# Patient Record
Sex: Male | Born: 1965 | Race: White | Hispanic: No | Marital: Single | Smoking: Former smoker
Health system: Southern US, Community
[De-identification: ages and names within clinical notes are randomized; demographics above are authoritative.]

## PROBLEM LIST (undated history)

## (undated) ENCOUNTER — Emergency Department (HOSPITAL_COMMUNITY): Admission: EM | Discharge status: Absent without leave | Payer: Medicaid Other

## (undated) DIAGNOSIS — I10 Essential (primary) hypertension: Secondary | ICD-10-CM

## (undated) DIAGNOSIS — J45909 Unspecified asthma, uncomplicated: Secondary | ICD-10-CM

## (undated) DIAGNOSIS — M810 Age-related osteoporosis without current pathological fracture: Secondary | ICD-10-CM

## (undated) DIAGNOSIS — Z981 Arthrodesis status: Secondary | ICD-10-CM

## (undated) DIAGNOSIS — J449 Chronic obstructive pulmonary disease, unspecified: Secondary | ICD-10-CM

## (undated) DIAGNOSIS — M1990 Unspecified osteoarthritis, unspecified site: Secondary | ICD-10-CM

## (undated) DIAGNOSIS — E785 Hyperlipidemia, unspecified: Secondary | ICD-10-CM

## (undated) HISTORY — DX: Essential (primary) hypertension: I10

## (undated) HISTORY — DX: Arthrodesis status: Z98.1

## (undated) HISTORY — DX: Age-related osteoporosis without current pathological fracture: M81.0

## (undated) HISTORY — DX: Chronic obstructive pulmonary disease, unspecified: J44.9

## (undated) HISTORY — DX: Hyperlipidemia, unspecified: E78.5

## (undated) HISTORY — PX: BACK SURGERY: SHX140

## (undated) HISTORY — PX: JOINT REPLACEMENT: SHX530

---

## 2005-10-17 ENCOUNTER — Emergency Department: Payer: Self-pay | Admitting: Emergency Medicine

## 2007-04-28 ENCOUNTER — Emergency Department: Payer: Self-pay | Admitting: Emergency Medicine

## 2010-11-10 ENCOUNTER — Emergency Department: Payer: Self-pay | Admitting: Unknown Physician Specialty

## 2011-12-06 DIAGNOSIS — M1990 Unspecified osteoarthritis, unspecified site: Secondary | ICD-10-CM | POA: Insufficient documentation

## 2012-04-20 HISTORY — PX: TOTAL KNEE ARTHROPLASTY: SHX125

## 2012-09-25 ENCOUNTER — Emergency Department: Payer: Self-pay | Admitting: Emergency Medicine

## 2012-10-03 DIAGNOSIS — I10 Essential (primary) hypertension: Secondary | ICD-10-CM | POA: Insufficient documentation

## 2012-10-15 ENCOUNTER — Emergency Department: Payer: Self-pay | Admitting: Emergency Medicine

## 2014-09-03 LAB — HM HIV SCREENING LAB: HM HIV Screening: NEGATIVE

## 2014-12-25 ENCOUNTER — Emergency Department
Admission: EM | Admit: 2014-12-25 | Discharge: 2014-12-25 | Disposition: A | Discharge status: Home / Self Care | Payer: Self-pay | Attending: Emergency Medicine | Admitting: Emergency Medicine

## 2014-12-25 ENCOUNTER — Encounter: Payer: Self-pay | Admitting: Emergency Medicine

## 2014-12-25 ENCOUNTER — Emergency Department: Payer: Self-pay

## 2014-12-25 DIAGNOSIS — Y998 Other external cause status: Secondary | ICD-10-CM | POA: Insufficient documentation

## 2014-12-25 DIAGNOSIS — S42001A Fracture of unspecified part of right clavicle, initial encounter for closed fracture: Secondary | ICD-10-CM | POA: Insufficient documentation

## 2014-12-25 DIAGNOSIS — Y9389 Activity, other specified: Secondary | ICD-10-CM | POA: Insufficient documentation

## 2014-12-25 DIAGNOSIS — M898X1 Other specified disorders of bone, shoulder: Secondary | ICD-10-CM

## 2014-12-25 DIAGNOSIS — X58XXXA Exposure to other specified factors, initial encounter: Secondary | ICD-10-CM | POA: Insufficient documentation

## 2014-12-25 DIAGNOSIS — Y9289 Other specified places as the place of occurrence of the external cause: Secondary | ICD-10-CM | POA: Insufficient documentation

## 2014-12-25 HISTORY — DX: Unspecified osteoarthritis, unspecified site: M19.90

## 2014-12-25 MED ORDER — PROMETHAZINE HCL 25 MG/ML IJ SOLN
INTRAMUSCULAR | Status: AC
  Administered 2014-12-25: 12.5 mg via INTRAVENOUS
  Filled 2014-12-25: qty 1

## 2014-12-25 MED ORDER — HYDROMORPHONE HCL 1 MG/ML IJ SOLN
2.0000 mg | Freq: Once | INTRAMUSCULAR | Status: AC
  Administered 2014-12-25: 2 mg via INTRAVENOUS

## 2014-12-25 MED ORDER — HYDROMORPHONE HCL 1 MG/ML IJ SOLN
INTRAMUSCULAR | Status: AC
  Administered 2014-12-25: 2 mg via INTRAVENOUS
  Filled 2014-12-25: qty 2

## 2014-12-25 MED ORDER — OXYCODONE-ACETAMINOPHEN 5-325 MG PO TABS: 1.0000 | ORAL_TABLET | ORAL | Status: DC | PRN

## 2014-12-25 MED ORDER — PROMETHAZINE HCL 25 MG/ML IJ SOLN
12.5000 mg | Freq: Once | INTRAMUSCULAR | Status: AC
  Administered 2014-12-25: 12.5 mg via INTRAVENOUS
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
## 2014-12-25 NOTE — Discharge Instructions (Signed)
Clavicle Fracture °The clavicle, also called the collarbone, is the long bone that connects your shoulder to your rib cage. You can feel your collarbone at the top of your shoulders and rib cage. A clavicle fracture is a broken clavicle. It is a common injury that can happen at any age.  °CAUSES °Common causes of a clavicle fracture include: °· A direct blow to your shoulder. °· A car accident. °· A fall, especially if you try to break your fall with an outstretched arm. °RISK FACTORS °You may be at increased risk if: °· You are younger than 25 years or older than 75 years. Most clavicle fractures happen to people who are younger than 25 years. °· You are a male. °· You play contact sports. °SIGNS AND SYMPTOMS °A fractured clavicle is painful. It also makes it hard to move your arm. Other signs and symptoms may include: °· A shoulder that drops downward and forward. °· Pain when trying to lift your shoulder. °· Bruising, swelling, and tenderness over your clavicle. °· A grinding noise when you try to move your shoulder. °· A bump over your clavicle. °DIAGNOSIS °Your health care provider can usually diagnose a clavicle fracture by asking about your injury and examining your shoulder and clavicle. He or she may take an X-ray to determine the position of your clavicle. °TREATMENT °Treatment depends on the position of your clavicle after the fracture: °· If the broken ends of the bone are not out of place, your health care provider may put your arm in a sling or wrap a support bandage around your chest (figure-of-eight wrap). °· If the broken ends of the bone are out of place, you may need surgery. Surgery may involve placing screws, pins, or plates to keep your clavicle stable while it heals. Healing may take about 3 months. °When your health care provider thinks your fracture has healed enough, you may have to do physical therapy to regain normal movement and build up your arm strength. °HOME CARE INSTRUCTIONS   °· Apply ice to the injured area: °¨ Put ice in a plastic bag. °¨ Place a towel between your skin and the bag. °¨ Leave the ice on for 20 minutes, 2-3 times a day. °· If you have a wrap or splint: °¨ Wear it all the time, and remove it only to take a bath or shower. °¨ When you bathe or shower, keep your shoulder in the same position as when the sling or wrap is on. °¨ Do not lift your arm. °· If you have a figure-of-eight wrap: °¨ Another person must tighten it every day. °¨ It should be tight enough to hold your shoulders back. °¨ Allow enough room to place your index finger between your body and the strap. °¨ Loosen the wrap immediately if you feel numbness or tingling in your hands. °· Only take medicines as directed by your health care provider. °· Avoid activities that make the injury or pain worse for 4-6 weeks after surgery. °· Keep all follow-up appointments. °SEEK MEDICAL CARE IF:  °Your medicine is not helping to relieve pain and swelling. °SEEK IMMEDIATE MEDICAL CARE IF:  °Your arm is numb, cold, or pale, even when the splint is loose. °MAKE SURE YOU:  °· Understand these instructions. °· Will watch your condition. °· Will get help right away if you are not doing well or get worse. °Document Released: 03/16/2005 Document Revised: 06/11/2013 Document Reviewed: 04/29/2013 °ExitCare® Patient Information ©2015 [employer]. This information is   not intended to replace advice given to you by your health care provider. Make sure you discuss any questions you have with your health care provider. ° °

## 2014-12-27 ENCOUNTER — Encounter: Payer: Self-pay | Admitting: Emergency Medicine

## 2014-12-27 ENCOUNTER — Emergency Department
Admission: EM | Admit: 2014-12-27 | Discharge: 2014-12-27 | Disposition: A | Discharge status: Home / Self Care | Payer: Self-pay | Attending: Emergency Medicine | Admitting: Emergency Medicine

## 2014-12-27 DIAGNOSIS — X58XXXD Exposure to other specified factors, subsequent encounter: Secondary | ICD-10-CM | POA: Insufficient documentation

## 2014-12-27 DIAGNOSIS — Z720 Tobacco use: Secondary | ICD-10-CM | POA: Insufficient documentation

## 2014-12-27 DIAGNOSIS — S42001D Fracture of unspecified part of right clavicle, subsequent encounter for fracture with routine healing: Secondary | ICD-10-CM | POA: Insufficient documentation

## 2014-12-27 DIAGNOSIS — Z760 Encounter for issue of repeat prescription: Secondary | ICD-10-CM | POA: Insufficient documentation

## 2014-12-27 MED ORDER — OXYCODONE-ACETAMINOPHEN 7.5-325 MG PO TABS: 1.0000 | ORAL_TABLET | ORAL | Status: AC | PRN

## 2014-12-27 NOTE — Discharge Instructions (Signed)
Medication Refill, Emergency Department °We have refilled your medication today as a courtesy to you. It is best for your medical care, however, to take care of getting refills done through your primary caregiver's office. They have your records and can do a better job of follow-up than we can in the emergency department. °On maintenance medications, we often only prescribe enough medications to get you by until you are able to see your regular caregiver. This is a more expensive way to refill medications. °In the future, please plan for refills so that you will not have to use the emergency department for this. °Thank you for your help. Your help allows us to better take care of the daily emergencies that enter our department. °Document Released: 09/23/2003 Document Revised: 08/29/2011 Document Reviewed: 09/13/2013 °ExitCare® Patient Information ©2015 [employer]. This information is not intended to replace advice given to you by your health care provider. Make sure you discuss any questions you have with your health care provider. ° °

## 2014-12-27 NOTE — ED Notes (Signed)
D/c instructions reviewed w/ pt - pt denies any further questions or concerns at present.  Pt instructed to not use alcohol, drive, or operate heavy machinery while take the prescription pain medications as they could make him drowsy - pt verbalized understanding.   

## 2014-12-27 NOTE — ED Notes (Signed)
States he broke his collar bone 2 days ago and was seen here, ran out of vicodin

## 2014-12-27 NOTE — ED Provider Notes (Signed)
[facility] Emergency Department Provider Note  ____________________________________________  Time seen: Approximately 10:56 AM  I have reviewed the triage vital signs and the nursing notes.   HISTORY  Chief Complaint Shoulder Injury    HPI Tom FarberRobert L Biglow is a 49 y.o. male patient here today for refill of his pain medication secondary to his right  fracture clavicle.  Patient patient was seen here on 12/25/2014 given a three-day prescription of Percocets. Patient say follow up with family doctor who would not give him a prescription due to him having pain medication from the ER. Patient stated that medication has run now is not scheduled to see his orthopedic into the middle neck suite. Patient asked for a prescription of Percocet for 2 days to time over until he can see his family doctor on Monday. Patient is rating his pain as a 10 over 10. Patient is wearing a right arm sling   Past Medical History  Diagnosis Date  . Arthritis     There are no active problems to display for this patient.   Past Surgical History  Procedure Laterality Date  . Joint replacement Right     knee  . Back surgery      Current Outpatient Rx  Name  Route  Sig  Dispense  Refill  . oxyCODONE-acetaminophen (PERCOCET) 7.5-325 MG per tablet   Oral   Take 1 tablet by mouth every 4 (four) hours as needed for severe pain.   8 tablet   0   . oxyCODONE-acetaminophen (ROXICET) 5-325 MG per tablet   Oral   Take 1-2 tablets by mouth every 4 (four) hours as needed for severe pain.   30 tablet   0     Allergies Bee venom  History reviewed. No pertinent family history.  Social History History  Substance Use Topics  . Smoking status: Current Every Day Smoker  . Smokeless tobacco: Not on file  . Alcohol Use: No    Review of Systems Constitutional: No fever/chills Eyes: No visual changes. ENT: No sore throat. Cardiovascular: Denies chest pain. Respiratory:  Denies shortness of breath. Gastrointestinal: No abdominal pain.  No nausea, no vomiting.  No diarrhea.  No constipation. Genitourinary: Negative for dysuria. Musculoskeletal: Right clavicle fracture Skin: Negative for rash. Neurological: Negative for headaches, focal weakness or numbness. 10-point ROS otherwise negative.  ____________________________________________   PHYSICAL EXAM:  VITAL SIGNS: ED Triage Vitals  Enc Vitals Group     BP 12/27/14 1041 126/84 mmHg     Pulse Rate 12/27/14 1041 90     Resp 12/27/14 1041 20     Temp 12/27/14 1041 98 F (36.7 C)     Temp src --      SpO2 12/27/14 1041 97 %     Weight 12/27/14 1041 210 lb (95.255 kg)     Height 12/27/14 1041 6' (1.829 m)     Head Cir --      Peak Flow --      Pain Score 12/27/14 1041 10     Pain Loc --      Pain Edu? --      Excl. in GC? --     Constitutional: Alert and oriented. Well appearing and in no acute distress. Eyes: Conjunctivae are normal. PERRL. EOMI. Head: Atraumatic. Nose: No congestion/rhinnorhea. Mouth/Throat: Mucous membranes are moist.  Oropharynx non-erythematous. Neck: No stridor.  No cervical spine tenderness to palpation. Hematological/Lymphatic/Immunilogical: No cervical lymphadenopathy. Cardiovascular: Normal rate, regular rhythm. Grossly normal heart sounds.  Good peripheral circulation. Respiratory: Normal respiratory effort.  No retractions. Lungs CTAB. Gastrointestinal: Soft and nontender. No distention. No abdominal bruits. No CVA tenderness. Musculoskeletal: Patient is wearing a sling exam deferred.  Neurologic:  Normal speech and language. No gross focal neurologic deficits are appreciated. Speech is normal. No gait instability. Skin:  Skin is warm, dry and intact. No rash noted. Psychiatric: Mood and affect are normal. Speech and behavior are normal.  ____________________________________________   LABS (all labs ordered are listed, but only abnormal results are  displayed)  Labs Reviewed - No data to display ____________________________________________  EKG   ____________________________________________  RADIOLOGY  Reviewed previous x-ray taken 2 days ago showed a comminuted right clavicle fracture ____________________________________________   PROCEDURES  Procedure(s) performed: None  Critical Care performed: No  ____________________________________________   INITIAL IMPRESSION / ASSESSMENT AND PLAN / ED COURSE  Pertinent labs & imaging results that were available during my care of the patient were reviewed by me and considered in my medical decision making (see chart for details).  Medication refill. Patient given  prescriptions for 2 days of Percocets. Advised must see PCP orthopedics. Continue pain medication. ____________________________________________   FINAL CLINICAL IMPRESSION(S) / ED DIAGNOSES  Final diagnoses:  Encounter for medication refill      Joni Reining, PA-C 12/27/14 1112  [name], MD 12/27/14 1526

## 2015-01-06 DIAGNOSIS — I2510 Atherosclerotic heart disease of native coronary artery without angina pectoris: Secondary | ICD-10-CM | POA: Insufficient documentation

## 2015-03-06 ENCOUNTER — Emergency Department
Admission: EM | Admit: 2015-03-06 | Discharge: 2015-03-06 | Discharge status: Against Medical Advice | Payer: Self-pay | Attending: Emergency Medicine | Admitting: Emergency Medicine

## 2015-03-06 DIAGNOSIS — R339 Retention of urine, unspecified: Secondary | ICD-10-CM | POA: Insufficient documentation

## 2015-03-06 DIAGNOSIS — Z720 Tobacco use: Secondary | ICD-10-CM | POA: Insufficient documentation

## 2015-03-06 NOTE — ED Notes (Signed)
Pt reports not being able to urinate since yesterday. Pain 4/10. "Feels a lot of abdominal pressure."

## 2015-03-06 NOTE — ED Notes (Signed)
Bladder scan performed. 141 mL

## 2015-03-09 ENCOUNTER — Telehealth: Payer: Self-pay | Admitting: Emergency Medicine
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
## 2015-06-21 HISTORY — PX: ANKLE FUSION: SHX881

## 2017-05-08 DIAGNOSIS — M19071 Primary osteoarthritis, right ankle and foot: Secondary | ICD-10-CM | POA: Insufficient documentation

## 2017-12-25 DIAGNOSIS — M19071 Primary osteoarthritis, right ankle and foot: Secondary | ICD-10-CM | POA: Diagnosis not present

## 2018-02-05 DIAGNOSIS — M19071 Primary osteoarthritis, right ankle and foot: Secondary | ICD-10-CM | POA: Diagnosis not present

## 2018-04-20 DIAGNOSIS — Z125 Encounter for screening for malignant neoplasm of prostate: Secondary | ICD-10-CM | POA: Diagnosis not present

## 2018-04-20 DIAGNOSIS — I10 Essential (primary) hypertension: Secondary | ICD-10-CM | POA: Diagnosis not present

## 2018-04-20 DIAGNOSIS — Z7289 Other problems related to lifestyle: Secondary | ICD-10-CM | POA: Diagnosis not present

## 2018-04-20 DIAGNOSIS — R61 Generalized hyperhidrosis: Secondary | ICD-10-CM | POA: Diagnosis not present

## 2018-04-20 DIAGNOSIS — Z1211 Encounter for screening for malignant neoplasm of colon: Secondary | ICD-10-CM | POA: Diagnosis not present

## 2018-04-20 DIAGNOSIS — M25571 Pain in right ankle and joints of right foot: Secondary | ICD-10-CM | POA: Diagnosis not present

## 2018-04-20 DIAGNOSIS — R6889 Other general symptoms and signs: Secondary | ICD-10-CM | POA: Diagnosis not present

## 2018-07-06 DIAGNOSIS — D125 Benign neoplasm of sigmoid colon: Secondary | ICD-10-CM | POA: Diagnosis not present

## 2018-07-06 DIAGNOSIS — K640 First degree hemorrhoids: Secondary | ICD-10-CM | POA: Diagnosis not present

## 2018-07-06 DIAGNOSIS — K5730 Diverticulosis of large intestine without perforation or abscess without bleeding: Secondary | ICD-10-CM | POA: Diagnosis not present

## 2018-07-06 DIAGNOSIS — Z1211 Encounter for screening for malignant neoplasm of colon: Secondary | ICD-10-CM | POA: Diagnosis not present

## 2018-07-06 DIAGNOSIS — Z800 Family history of malignant neoplasm of digestive organs: Secondary | ICD-10-CM | POA: Diagnosis not present

## 2018-08-20 DIAGNOSIS — M19071 Primary osteoarthritis, right ankle and foot: Secondary | ICD-10-CM | POA: Diagnosis not present

## 2018-11-03 DIAGNOSIS — I10 Essential (primary) hypertension: Secondary | ICD-10-CM | POA: Diagnosis not present

## 2018-11-08 DIAGNOSIS — I10 Essential (primary) hypertension: Secondary | ICD-10-CM | POA: Diagnosis not present

## 2018-11-08 DIAGNOSIS — R062 Wheezing: Secondary | ICD-10-CM | POA: Diagnosis not present

## 2018-11-08 DIAGNOSIS — Z87891 Personal history of nicotine dependence: Secondary | ICD-10-CM | POA: Diagnosis not present

## 2018-11-23 DIAGNOSIS — I10 Essential (primary) hypertension: Secondary | ICD-10-CM | POA: Diagnosis not present

## 2019-01-29 DIAGNOSIS — H524 Presbyopia: Secondary | ICD-10-CM | POA: Diagnosis not present

## 2019-02-12 DIAGNOSIS — H5213 Myopia, bilateral: Secondary | ICD-10-CM | POA: Diagnosis not present

## 2019-03-22 DIAGNOSIS — H524 Presbyopia: Secondary | ICD-10-CM | POA: Diagnosis not present

## 2019-04-04 DIAGNOSIS — M25571 Pain in right ankle and joints of right foot: Secondary | ICD-10-CM | POA: Diagnosis not present

## 2019-04-04 DIAGNOSIS — Z87891 Personal history of nicotine dependence: Secondary | ICD-10-CM | POA: Diagnosis not present

## 2019-04-04 DIAGNOSIS — Z981 Arthrodesis status: Secondary | ICD-10-CM | POA: Diagnosis not present

## 2019-04-04 DIAGNOSIS — M19071 Primary osteoarthritis, right ankle and foot: Secondary | ICD-10-CM | POA: Diagnosis not present

## 2019-04-04 DIAGNOSIS — G8929 Other chronic pain: Secondary | ICD-10-CM | POA: Diagnosis not present

## 2019-04-09 DIAGNOSIS — M19071 Primary osteoarthritis, right ankle and foot: Secondary | ICD-10-CM | POA: Diagnosis not present

## 2019-04-10 DIAGNOSIS — M19071 Primary osteoarthritis, right ankle and foot: Secondary | ICD-10-CM | POA: Diagnosis not present

## 2019-04-30 DIAGNOSIS — M19071 Primary osteoarthritis, right ankle and foot: Secondary | ICD-10-CM | POA: Diagnosis not present

## 2019-05-22 DIAGNOSIS — D125 Benign neoplasm of sigmoid colon: Secondary | ICD-10-CM | POA: Diagnosis not present

## 2019-05-22 DIAGNOSIS — Z125 Encounter for screening for malignant neoplasm of prostate: Secondary | ICD-10-CM | POA: Diagnosis not present

## 2019-05-22 DIAGNOSIS — I10 Essential (primary) hypertension: Secondary | ICD-10-CM | POA: Diagnosis not present

## 2019-06-25 DIAGNOSIS — Z800 Family history of malignant neoplasm of digestive organs: Secondary | ICD-10-CM | POA: Diagnosis not present

## 2019-06-25 DIAGNOSIS — D126 Benign neoplasm of colon, unspecified: Secondary | ICD-10-CM | POA: Diagnosis not present

## 2019-06-25 DIAGNOSIS — Z87891 Personal history of nicotine dependence: Secondary | ICD-10-CM | POA: Diagnosis not present

## 2019-06-25 DIAGNOSIS — I2510 Atherosclerotic heart disease of native coronary artery without angina pectoris: Secondary | ICD-10-CM | POA: Diagnosis not present

## 2019-06-25 DIAGNOSIS — I10 Essential (primary) hypertension: Secondary | ICD-10-CM | POA: Diagnosis not present

## 2019-06-25 DIAGNOSIS — R7309 Other abnormal glucose: Secondary | ICD-10-CM | POA: Diagnosis not present

## 2019-06-25 DIAGNOSIS — G8929 Other chronic pain: Secondary | ICD-10-CM | POA: Diagnosis not present

## 2019-09-04 DIAGNOSIS — I10 Essential (primary) hypertension: Secondary | ICD-10-CM | POA: Diagnosis not present

## 2019-09-04 DIAGNOSIS — D125 Benign neoplasm of sigmoid colon: Secondary | ICD-10-CM | POA: Diagnosis not present

## 2019-09-04 DIAGNOSIS — Z125 Encounter for screening for malignant neoplasm of prostate: Secondary | ICD-10-CM | POA: Diagnosis not present

## 2019-09-04 DIAGNOSIS — F17211 Nicotine dependence, cigarettes, in remission: Secondary | ICD-10-CM | POA: Diagnosis not present

## 2019-09-25 DIAGNOSIS — Z7982 Long term (current) use of aspirin: Secondary | ICD-10-CM | POA: Diagnosis not present

## 2019-09-25 DIAGNOSIS — Z87891 Personal history of nicotine dependence: Secondary | ICD-10-CM | POA: Diagnosis not present

## 2019-09-25 DIAGNOSIS — R945 Abnormal results of liver function studies: Secondary | ICD-10-CM | POA: Diagnosis not present

## 2019-09-25 DIAGNOSIS — R1031 Right lower quadrant pain: Secondary | ICD-10-CM | POA: Diagnosis not present

## 2019-09-25 DIAGNOSIS — R112 Nausea with vomiting, unspecified: Secondary | ICD-10-CM | POA: Diagnosis not present

## 2019-09-25 DIAGNOSIS — R7989 Other specified abnormal findings of blood chemistry: Secondary | ICD-10-CM | POA: Diagnosis not present

## 2019-09-25 DIAGNOSIS — Z7951 Long term (current) use of inhaled steroids: Secondary | ICD-10-CM | POA: Diagnosis not present

## 2019-09-25 DIAGNOSIS — R1011 Right upper quadrant pain: Secondary | ICD-10-CM | POA: Diagnosis not present

## 2019-09-25 DIAGNOSIS — I10 Essential (primary) hypertension: Secondary | ICD-10-CM | POA: Diagnosis not present

## 2019-09-25 DIAGNOSIS — K760 Fatty (change of) liver, not elsewhere classified: Secondary | ICD-10-CM | POA: Diagnosis not present

## 2019-09-25 DIAGNOSIS — R10813 Right lower quadrant abdominal tenderness: Secondary | ICD-10-CM | POA: Diagnosis not present

## 2019-09-25 LAB — HM HEPATITIS C SCREENING LAB: HM Hepatitis Screen: NEGATIVE

## 2019-09-30 ENCOUNTER — Ambulatory Visit: Payer: Self-pay

## 2019-10-07 DIAGNOSIS — M25551 Pain in right hip: Secondary | ICD-10-CM | POA: Diagnosis not present

## 2019-10-07 DIAGNOSIS — R1031 Right lower quadrant pain: Secondary | ICD-10-CM | POA: Diagnosis not present

## 2019-10-07 DIAGNOSIS — R110 Nausea: Secondary | ICD-10-CM | POA: Diagnosis not present

## 2019-10-07 DIAGNOSIS — Z7982 Long term (current) use of aspirin: Secondary | ICD-10-CM | POA: Diagnosis not present

## 2019-10-07 DIAGNOSIS — I10 Essential (primary) hypertension: Secondary | ICD-10-CM | POA: Diagnosis not present

## 2019-10-07 DIAGNOSIS — M1990 Unspecified osteoarthritis, unspecified site: Secondary | ICD-10-CM | POA: Diagnosis not present

## 2019-10-07 DIAGNOSIS — Z87891 Personal history of nicotine dependence: Secondary | ICD-10-CM | POA: Diagnosis not present

## 2019-10-07 DIAGNOSIS — Z79899 Other long term (current) drug therapy: Secondary | ICD-10-CM | POA: Diagnosis not present

## 2019-10-07 DIAGNOSIS — E876 Hypokalemia: Secondary | ICD-10-CM | POA: Diagnosis not present

## 2019-10-07 DIAGNOSIS — Z800 Family history of malignant neoplasm of digestive organs: Secondary | ICD-10-CM | POA: Diagnosis not present

## 2019-10-07 DIAGNOSIS — K760 Fatty (change of) liver, not elsewhere classified: Secondary | ICD-10-CM | POA: Diagnosis not present

## 2019-10-11 DIAGNOSIS — Z23 Encounter for immunization: Secondary | ICD-10-CM | POA: Diagnosis not present

## 2019-10-18 DIAGNOSIS — R7401 Elevation of levels of liver transaminase levels: Secondary | ICD-10-CM | POA: Diagnosis not present

## 2019-10-18 DIAGNOSIS — I10 Essential (primary) hypertension: Secondary | ICD-10-CM | POA: Diagnosis not present

## 2019-11-29 DIAGNOSIS — R7401 Elevation of levels of liver transaminase levels: Secondary | ICD-10-CM | POA: Diagnosis not present

## 2019-11-29 DIAGNOSIS — E876 Hypokalemia: Secondary | ICD-10-CM | POA: Diagnosis not present

## 2020-04-29 DIAGNOSIS — M25551 Pain in right hip: Secondary | ICD-10-CM | POA: Diagnosis not present

## 2020-04-29 DIAGNOSIS — Z23 Encounter for immunization: Secondary | ICD-10-CM | POA: Diagnosis not present

## 2020-04-29 DIAGNOSIS — M1611 Unilateral primary osteoarthritis, right hip: Secondary | ICD-10-CM | POA: Diagnosis not present

## 2020-04-29 DIAGNOSIS — M160 Bilateral primary osteoarthritis of hip: Secondary | ICD-10-CM | POA: Diagnosis not present

## 2020-05-18 DIAGNOSIS — M5416 Radiculopathy, lumbar region: Secondary | ICD-10-CM | POA: Diagnosis not present

## 2020-05-18 DIAGNOSIS — M25551 Pain in right hip: Secondary | ICD-10-CM | POA: Diagnosis not present

## 2020-05-25 DIAGNOSIS — M25551 Pain in right hip: Secondary | ICD-10-CM | POA: Diagnosis not present

## 2020-05-25 DIAGNOSIS — Z7289 Other problems related to lifestyle: Secondary | ICD-10-CM | POA: Diagnosis not present

## 2020-06-15 DIAGNOSIS — M47816 Spondylosis without myelopathy or radiculopathy, lumbar region: Secondary | ICD-10-CM | POA: Diagnosis not present

## 2020-06-15 DIAGNOSIS — M5416 Radiculopathy, lumbar region: Secondary | ICD-10-CM | POA: Diagnosis not present

## 2020-06-15 DIAGNOSIS — M48061 Spinal stenosis, lumbar region without neurogenic claudication: Secondary | ICD-10-CM | POA: Diagnosis not present

## 2020-06-15 DIAGNOSIS — M4726 Other spondylosis with radiculopathy, lumbar region: Secondary | ICD-10-CM | POA: Diagnosis not present

## 2020-07-09 DIAGNOSIS — M5416 Radiculopathy, lumbar region: Secondary | ICD-10-CM | POA: Diagnosis not present

## 2020-08-28 DIAGNOSIS — M5416 Radiculopathy, lumbar region: Secondary | ICD-10-CM | POA: Diagnosis not present

## 2020-09-30 DIAGNOSIS — R1110 Vomiting, unspecified: Secondary | ICD-10-CM | POA: Diagnosis not present

## 2020-09-30 DIAGNOSIS — R0602 Shortness of breath: Secondary | ICD-10-CM | POA: Diagnosis not present

## 2020-09-30 DIAGNOSIS — R5383 Other fatigue: Secondary | ICD-10-CM | POA: Diagnosis not present

## 2020-09-30 DIAGNOSIS — Z20822 Contact with and (suspected) exposure to covid-19: Secondary | ICD-10-CM | POA: Diagnosis not present

## 2020-11-12 DIAGNOSIS — M5416 Radiculopathy, lumbar region: Secondary | ICD-10-CM | POA: Diagnosis not present

## 2020-12-16 ENCOUNTER — Ambulatory Visit: Payer: Medicaid Other | Admitting: Family Medicine

## 2020-12-16 ENCOUNTER — Encounter: Payer: Self-pay | Admitting: Family Medicine

## 2020-12-16 ENCOUNTER — Other Ambulatory Visit: Payer: Self-pay

## 2020-12-16 VITALS — BP 124/86 | HR 117 | Ht 71.5 in | Wt 244.6 lb

## 2020-12-16 DIAGNOSIS — M545 Low back pain, unspecified: Secondary | ICD-10-CM | POA: Diagnosis not present

## 2020-12-16 DIAGNOSIS — G8929 Other chronic pain: Secondary | ICD-10-CM | POA: Insufficient documentation

## 2020-12-16 DIAGNOSIS — I10 Essential (primary) hypertension: Secondary | ICD-10-CM | POA: Diagnosis not present

## 2020-12-16 DIAGNOSIS — M5136 Other intervertebral disc degeneration, lumbar region: Secondary | ICD-10-CM

## 2020-12-16 NOTE — Patient Instructions (Addendum)
Thank you for coming to the office today.  Medication Taper Off Instructions Current med - Duloxetine 20mg  x 2 = 40mg  daily  Week 1-2: Duloxetine 20mg  once daily Week 3-4: Alternate every OTHER day - Duloxetine 20mg  and then next day HOLD med Week 5-6: Next go to Duloxetine 200mg  once and then SKIP or HOLD dose for 2 days, then repeat dose Week 7-8: If need can take 1 dose every 3-5 days if needed STOP  completely  Keep track of BP on current meds. Remain OFF Amlodipine   DUE for FASTING BLOOD WORK (no food or drink after midnight before the lab appointment, only water or coffee without cream/sugar on the morning of)  SCHEDULE "Lab Only" visit in the morning at the clinic for lab draw in 6 WEEKS   - Make sure Lab Only appointment is at about 1 week before your next appointment, so that results will be available  For Lab Results, once available within 2-3 days of blood draw, you can can log in to MyChart online to view your results and a brief explanation. Also, we can discuss results at next follow-up visit.   Please schedule a Follow-up Appointment to: Return in about 6 weeks (around 01/27/2021) for 6 week fasting lab only then 1 week later Annual Physical.  If you have any other questions or concerns, please feel free to call the office or send a message through MyChart. You may also schedule an earlier appointment if necessary.  Additionally, you may be receiving a survey about your experience at our office within a few days to 1 week by e-mail or mail. We value your feedback.  , DO [facility], 

## 2020-12-16 NOTE — Progress Notes (Signed)
Subjective:    Patient ID: Tom [name], male    DOB: Feb 02, 1966, 55 y.o.   MRN: [id number]  Tom [name] is a 55 y.o. male presenting on 12/16/2020 for Establish Care  Previously followed by PCP   HPI  CHRONIC HTN: Reports he has had high blood pressure for several years and has been managed by prior PCP. He was on Lisinopril and Chlorthalidone in past, and then had side effects ill on Lisinopril, switched to Losartan. Now within past 6 months he was on Amlodipine trial and it made him sick as well he came off of this. Now BP is improved. Current Meds - Chlorthalidone 25mg  daily, Losartan 25mg  daily   Reports good compliance, took meds today. Tolerating well, w/o complaints. 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 He is now established and not seeing them regularly now he is doing better. - Today he asks about coming off the Cymbalta. He has side effects on it with nausea. - He tried to taper off the Cymbalta to 20mg  x 2 = 40mg , he tried every other day and then every 3rd day but felt sick and was unable to DC    Depression screen [facility] 2/9 12/16/2020  Decreased Interest 0  Down, Depressed, Hopeless 0  PHQ - 2 Score 0  Altered sleeping 0  Tired, decreased energy 0  Change in appetite 0  Feeling bad or failure about yourself  0  Trouble concentrating 0  Moving slowly or fidgety/restless 0  Suicidal thoughts 0  PHQ-9 Score 0  Difficult doing work/chores Not difficult at all    Past Medical History:  Diagnosis Date   Arthritis    COPD (chronic obstructive pulmonary disease) (HCC)    Hyperlipidemia     Hypertension    Osteoporosis    Past Surgical History:  Procedure Laterality Date   BACK SURGERY     JOINT REPLACEMENT Right    knee   Social History   Socioeconomic History   Marital status: Single    Spouse name: Not on file   Number of children: Not on file   Years of education: Not on file   Highest education level: Not on file  Occupational History   Not on file  Tobacco Use   Smoking status: Former    Pack years: 0.00    Types: Cigarettes    Quit date: 2019    Years since quitting: 3.4   Smokeless tobacco: Never  Vaping Use   Vaping Use: Never used  Substance and Sexual Activity   Alcohol use: No   Drug use: Not on file   Sexual activity: Not on file  Other Topics Concern   Not on file  Social History Narrative   Not on file   Social Determinants of Health   Financial Resource Strain: Not on file  Food Insecurity: Not on file  Transportation Needs: Not on file  Physical Activity: Not on file  Stress: Not on file  Social Connections: Not on file  Intimate Partner Violence: Not on file   History reviewed. No pertinent family history. Current Outpatient Medications on File Prior to Visit  Medication Sig  albuterol (VENTOLIN HFA) 108 (90 Base) MCG/ACT inhaler Inhale into the lungs.   atorvastatin (LIPITOR) 40 MG tablet Take 1 tablet by mouth daily.   chlorthalidone (HYGROTON) 25 MG tablet Take 25 mg by mouth daily.   DULoxetine (CYMBALTA) 20 MG capsule Take 40 mg by mouth daily. Instructions to taper off now as of 12/16/20   losartan (COZAAR) 25 MG tablet Take 1 tablet by mouth daily.   Multiple Vitamin (MULTIVITAMIN) capsule Take 1 capsule by mouth daily.   potassium chloride SA (KLOR-CON) 20 MEQ tablet Take 1 tablet by mouth daily.   SPIRIVA HANDIHALER 18 MCG inhalation capsule 1 capsule daily.   No current facility-administered medications on file prior to visit.    Review of Systems Per HPI unless specifically indicated above     Objective:     BP 124/86 (BP Location: Left Arm, Cuff Size: Normal)   Pulse (!) 117   Ht 5' 11.5" (1.816 m)   Wt 244 lb 9.6 oz (110.9 kg)   SpO2 97%   BMI 33.64 kg/m   Wt Readings from Last 3 Encounters:  12/16/20 244 lb 9.6 oz (110.9 kg)  03/06/15 200 lb (90.7 kg)  12/27/14 210 lb (95.3 kg)    Physical Exam Vitals and nursing note reviewed.  Constitutional:      General: He is not in acute distress.    Appearance: Normal appearance. He is well-developed. He is not diaphoretic.     Comments: Well-appearing, comfortable, cooperative  HENT:     Head: Normocephalic and atraumatic.  Eyes:     General:        Right eye: No discharge.        Left eye: No discharge.     Conjunctiva/sclera: Conjunctivae normal.  Cardiovascular:     Rate and Rhythm: Normal rate.  Pulmonary:     Effort: Pulmonary effort is normal.  Skin:    General: Skin is warm and dry.     Findings: No erythema or rash.  Neurological:     Mental Status: He is alert and oriented to person, place, and time.  Psychiatric:        Mood and Affect: Mood normal.        Behavior: Behavior normal.        Thought Content: Thought content normal.     Comments: Well groomed, good eye contact, normal speech and thoughts   No results found for this or any previous visit.    Assessment & Plan:   Problem List Items Addressed This Visit     Essential hypertension - Primary   Relevant Medications   losartan (COZAAR) 25 MG tablet   atorvastatin (LIPITOR) 40 MG tablet   chlorthalidone (HYGROTON) 25 MG tablet   DDD (degenerative disc disease), lumbar   Chronic back pain   Relevant Medications   DULoxetine (CYMBALTA) 20 MG capsule    Establish care Review outside records from prior PCP  HTN Repeat manual BP controlled Continue current medications  Osteoarthritis / DDD Chronic Pain Syndrome Improved dramatically after ESI injection series  Failed Cymbalta therapy, will taper off now to avoid side effects of  discontinuation  Week 1-2: Duloxetine 20mg  once daily Week 3-4: Alternate every OTHER day - Duloxetine 20mg  and then next day HOLD med Week 5-6: Next go to Duloxetine 200mg  once and then SKIP or HOLD dose for 2 days, then repeat dose Week 7-8: If need can take 1 dose every 3-5 days if needed STOP  completely   No  orders of the defined types were placed in this encounter.    Follow up plan: Return in about 6 weeks (around 01/27/2021) for 6 week fasting lab only then 1 week later Annual Physical.   Saralyn Pilar, DO [facility] 12/16/2020, 3:29 PM

## 2020-12-17 ENCOUNTER — Other Ambulatory Visit: Payer: Self-pay | Admitting: Family Medicine

## 2020-12-17 DIAGNOSIS — M5136 Other intervertebral disc degeneration, lumbar region: Secondary | ICD-10-CM

## 2020-12-17 DIAGNOSIS — I10 Essential (primary) hypertension: Secondary | ICD-10-CM

## 2020-12-17 DIAGNOSIS — Z0000 Encounter for general adult medical examination without abnormal findings: Secondary | ICD-10-CM

## 2020-12-17 DIAGNOSIS — Z125 Encounter for screening for malignant neoplasm of prostate: Secondary | ICD-10-CM

## 2020-12-17 DIAGNOSIS — R7309 Other abnormal glucose: Secondary | ICD-10-CM

## 2020-12-17 DIAGNOSIS — I2510 Atherosclerotic heart disease of native coronary artery without angina pectoris: Secondary | ICD-10-CM

## 2020-12-30 ENCOUNTER — Other Ambulatory Visit: Payer: Self-pay | Admitting: Family Medicine

## 2020-12-30 DIAGNOSIS — I2510 Atherosclerotic heart disease of native coronary artery without angina pectoris: Secondary | ICD-10-CM

## 2020-12-30 DIAGNOSIS — I10 Essential (primary) hypertension: Secondary | ICD-10-CM

## 2020-12-30 MED ORDER — POTASSIUM CHLORIDE CRYS ER 20 MEQ PO TBCR: 20.0000 meq | EXTENDED_RELEASE_TABLET | Freq: Every day | ORAL | 5 refills | Status: DC

## 2020-12-30 MED ORDER — ATORVASTATIN CALCIUM 40 MG PO TABS: 40.0000 mg | ORAL_TABLET | Freq: Every day | ORAL | 5 refills | Status: DC

## 2020-12-30 MED ORDER — LOSARTAN POTASSIUM 25 MG PO TABS: 25.0000 mg | ORAL_TABLET | Freq: Every day | ORAL | 5 refills | Status: DC

## 2020-12-30 NOTE — Telephone Encounter (Signed)
Requested medication (s) are due for refill today: Yes  Requested medication (s) are on the active medication list: Yes  Last refill:  2021  Future visit scheduled: Yes  Notes to clinic:  Unable to refill per protocol, last refill by another provider.      Requested Prescriptions  Pending Prescriptions Disp Refills   atorvastatin (LIPITOR) 40 MG tablet 30 tablet 11    Sig: Take 1 tablet (40 mg total) by mouth daily.      Cardiovascular:  Antilipid - Statins Failed - 12/30/2020  2:57 PM      Failed - Total Cholesterol in normal range and within 360 days    No results found for: CHOL, POCCHOL, CHOLTOT        Failed - LDL in normal range and within 360 days    No results found for: LDLCALC, LDLC, HIRISKLDL, POCLDL, LDLDIRECT, REALLDLC, TOTLDLC        Failed - HDL in normal range and within 360 days    No results found for: HDL, POCHDL        Failed - Triglycerides in normal range and within 360 days    No results found for: TRIG, POCTRIG        Passed - Patient is not pregnant      Passed - Valid encounter within last 12 months    Recent Outpatient Visits           2 weeks ago Essential hypertension   [facility] [place], [name], DO       Future Appointments             In 4 weeks Tom [name], [name], DO [facility], [facility]               potassium chloride SA (KLOR-CON) 20 MEQ tablet 30 tablet 11    Sig: Take 1 tablet (20 mEq total) by mouth daily.      Endocrinology:  Minerals - Potassium Supplementation Failed - 12/30/2020  2:57 PM      Failed - K in normal range and within 360 days    No results found for: K, POTASSIUM, POCK        Failed - Cr in normal range and within 360 days    No results found for: CREATININE, LABCREAU, LABCREA, POCCRE        Passed - Valid encounter within last 12 months    Recent Outpatient Visits           2 weeks ago Essential hypertension   [facility]  [place], [name], DO       Future Appointments             In 4 weeks Tom [name], [name], DO [facility], [facility]               losartan (COZAAR) 25 MG tablet 30 tablet 11    Sig: Take 1 tablet (25 mg total) by mouth daily.      Cardiovascular:  Angiotensin Receptor Blockers Failed - 12/30/2020  2:57 PM      Failed - Cr in normal range and within 180 days    No results found for: CREATININE, LABCREAU, LABCREA, POCCRE        Failed - K in normal range and within 180 days    No results found for: K, POTASSIUM, POCK        Passed - Patient is not pregnant  Passed - Last BP in normal range    BP Readings from Last 1 Encounters:  12/16/20 124/86          Passed - Valid encounter within last 6 months    Recent Outpatient Visits           2 weeks ago Essential hypertension   [facility] [place], [name], DO       Future Appointments             In 4 weeks Tom [name], [name], DO [facility], [facility]

## 2020-12-30 NOTE — Telephone Encounter (Signed)
Copied from CRM [id number]. Topic: Quick Communication - Rx Refill/Question >> Dec 30, 2020  2:17 PM Jaquita Rector A wrote: Medication: potassium chloride SA (KLOR-CON) 20 MEQ tablet, atorvastatin (LIPITOR) 40 MG tablet, losartan (COZAAR) 25 MG tablet  Has the patient contacted their pharmacy? Yes.   (Agent: If no, request that the patient contact the pharmacy for the refill.) (Agent: If yes, when and what did the pharmacy advise?)  Preferred Pharmacy (with phone number or street name): [id number] - [place], [place] - [address]  Phone:  [phone] Fax:  [phone]     Agent: Please be advised that RX refills may take up to 3 business days. We ask that you follow-up with your pharmacy.

## 2021-01-19 ENCOUNTER — Other Ambulatory Visit: Payer: Self-pay

## 2021-01-19 DIAGNOSIS — I10 Essential (primary) hypertension: Secondary | ICD-10-CM

## 2021-01-19 DIAGNOSIS — R7309 Other abnormal glucose: Secondary | ICD-10-CM

## 2021-01-19 DIAGNOSIS — Z125 Encounter for screening for malignant neoplasm of prostate: Secondary | ICD-10-CM

## 2021-01-19 DIAGNOSIS — I2510 Atherosclerotic heart disease of native coronary artery without angina pectoris: Secondary | ICD-10-CM

## 2021-01-19 DIAGNOSIS — Z0000 Encounter for general adult medical examination without abnormal findings: Secondary | ICD-10-CM

## 2021-01-19 DIAGNOSIS — M5136 Other intervertebral disc degeneration, lumbar region: Secondary | ICD-10-CM

## 2021-01-20 ENCOUNTER — Encounter (INDEPENDENT_AMBULATORY_CARE_PROVIDER_SITE_OTHER): Payer: Self-pay

## 2021-01-20 ENCOUNTER — Other Ambulatory Visit: Payer: Medicaid Other

## 2021-01-20 DIAGNOSIS — R7309 Other abnormal glucose: Secondary | ICD-10-CM | POA: Diagnosis not present

## 2021-01-20 DIAGNOSIS — I2510 Atherosclerotic heart disease of native coronary artery without angina pectoris: Secondary | ICD-10-CM | POA: Diagnosis not present

## 2021-01-20 DIAGNOSIS — Z125 Encounter for screening for malignant neoplasm of prostate: Secondary | ICD-10-CM | POA: Diagnosis not present

## 2021-01-20 DIAGNOSIS — Z0000 Encounter for general adult medical examination without abnormal findings: Secondary | ICD-10-CM | POA: Diagnosis not present

## 2021-01-20 DIAGNOSIS — M5136 Other intervertebral disc degeneration, lumbar region: Secondary | ICD-10-CM | POA: Diagnosis not present

## 2021-01-20 DIAGNOSIS — I10 Essential (primary) hypertension: Secondary | ICD-10-CM | POA: Diagnosis not present

## 2021-01-21 LAB — COMPLETE METABOLIC PANEL WITH GFR
AG Ratio: 0.9 (calc) — ABNORMAL LOW (ref 1.0–2.5)
ALT: 38 U/L (ref 9–46)
AST: 77 U/L — ABNORMAL HIGH (ref 10–35)
Albumin: 3 g/dL — ABNORMAL LOW (ref 3.6–5.1)
Alkaline phosphatase (APISO): 106 U/L (ref 35–144)
BUN: 17 mg/dL (ref 7–25)
CO2: 30 mmol/L (ref 20–32)
Calcium: 8.8 mg/dL (ref 8.6–10.3)
Chloride: 103 mmol/L (ref 98–110)
Creat: 1.16 mg/dL (ref 0.70–1.30)
Globulin: 3.2 g/dL (calc) (ref 1.9–3.7)
Glucose, Bld: 132 mg/dL — ABNORMAL HIGH (ref 65–99)
Potassium: 4.1 mmol/L (ref 3.5–5.3)
Sodium: 141 mmol/L (ref 135–146)
Total Bilirubin: 0.5 mg/dL (ref 0.2–1.2)
Total Protein: 6.2 g/dL (ref 6.1–8.1)
eGFR: 74 mL/min/{1.73_m2} (ref >=60)

## 2021-01-21 LAB — LIPID PANEL
Cholesterol: 98 mg/dL (ref <200)
HDL: 30 mg/dL — ABNORMAL LOW (ref >=40)
LDL Cholesterol (Calc): 48 mg/dL (calc)
Non-HDL Cholesterol (Calc): 68 mg/dL (calc) (ref <130)
Total CHOL/HDL Ratio: 3.3 (calc) (ref <5.0)
Triglycerides: 122 mg/dL (ref <150)

## 2021-01-21 LAB — HEMOGLOBIN A1C
Hgb A1c MFr Bld: 5.7 % of total Hgb — ABNORMAL HIGH (ref <5.7)
Mean Plasma Glucose: 117 mg/dL
eAG (mmol/L): 6.5 mmol/L

## 2021-01-21 LAB — CBC WITH DIFFERENTIAL/PLATELET
Absolute Monocytes: 1156 cells/uL — ABNORMAL HIGH (ref 200–950)
Basophils Absolute: 94 cells/uL (ref 0–200)
Basophils Relative: 1.1 %
Eosinophils Absolute: 230 cells/uL (ref 15–500)
Eosinophils Relative: 2.7 %
HCT: 40.6 % (ref 38.5–50.0)
Hemoglobin: 13.1 g/dL — ABNORMAL LOW (ref 13.2–17.1)
Lymphs Abs: 2210 cells/uL (ref 850–3900)
MCH: 33.9 pg — ABNORMAL HIGH (ref 27.0–33.0)
MCHC: 32.3 g/dL (ref 32.0–36.0)
MCV: 104.9 fL — ABNORMAL HIGH (ref 80.0–100.0)
MPV: 11.5 fL (ref 7.5–12.5)
Monocytes Relative: 13.6 %
Neutro Abs: 4811 cells/uL (ref 1500–7800)
Neutrophils Relative %: 56.6 %
Platelets: 240 10*3/uL (ref 140–400)
RBC: 3.87 10*6/uL — ABNORMAL LOW (ref 4.20–5.80)
RDW: 12.6 % (ref 11.0–15.0)
Total Lymphocyte: 26 %
WBC: 8.5 10*3/uL (ref 3.8–10.8)

## 2021-01-21 LAB — TSH: TSH: 1.32 mIU/L (ref 0.40–4.50)

## 2021-01-21 LAB — PSA: PSA: 1.17 ng/mL (ref <=4.00)

## 2021-01-27 ENCOUNTER — Other Ambulatory Visit: Payer: Self-pay

## 2021-01-27 ENCOUNTER — Encounter: Payer: Self-pay | Admitting: Family Medicine

## 2021-01-27 ENCOUNTER — Other Ambulatory Visit: Payer: Self-pay | Admitting: Family Medicine

## 2021-01-27 ENCOUNTER — Ambulatory Visit (INDEPENDENT_AMBULATORY_CARE_PROVIDER_SITE_OTHER): Payer: Medicaid Other | Admitting: Family Medicine

## 2021-01-27 VITALS — BP 126/74 | HR 101 | Ht 71.5 in | Wt 253.8 lb

## 2021-01-27 DIAGNOSIS — Z1211 Encounter for screening for malignant neoplasm of colon: Secondary | ICD-10-CM

## 2021-01-27 DIAGNOSIS — I10 Essential (primary) hypertension: Secondary | ICD-10-CM | POA: Diagnosis not present

## 2021-01-27 DIAGNOSIS — Z800 Family history of malignant neoplasm of digestive organs: Secondary | ICD-10-CM | POA: Diagnosis not present

## 2021-01-27 DIAGNOSIS — J432 Centrilobular emphysema: Secondary | ICD-10-CM

## 2021-01-27 DIAGNOSIS — I2510 Atherosclerotic heart disease of native coronary artery without angina pectoris: Secondary | ICD-10-CM

## 2021-01-27 DIAGNOSIS — Z0000 Encounter for general adult medical examination without abnormal findings: Secondary | ICD-10-CM | POA: Diagnosis not present

## 2021-01-27 MED ORDER — ALBUTEROL SULFATE HFA 108 (90 BASE) MCG/ACT IN AERS: 1.0000 | INHALATION_SPRAY | RESPIRATORY_TRACT | 3 refills | Status: DC | PRN

## 2021-01-27 MED ORDER — POTASSIUM CHLORIDE CRYS ER 20 MEQ PO TBCR: 20.0000 meq | EXTENDED_RELEASE_TABLET | Freq: Every day | ORAL | 3 refills | Status: DC

## 2021-01-27 MED ORDER — LOSARTAN POTASSIUM 25 MG PO TABS: 25.0000 mg | ORAL_TABLET | Freq: Every day | ORAL | 3 refills | Status: DC

## 2021-01-27 MED ORDER — ATORVASTATIN CALCIUM 40 MG PO TABS: 40.0000 mg | ORAL_TABLET | Freq: Every day | ORAL | 3 refills | Status: DC

## 2021-01-27 MED ORDER — CHLORTHALIDONE 25 MG PO TABS: 25.0000 mg | ORAL_TABLET | Freq: Every day | ORAL | 3 refills | Status: DC

## 2021-01-27 NOTE — Addendum Note (Signed)
Addended by: [name] on: 01/27/2021 02:19 PM   Modules accepted: Orders

## 2021-01-27 NOTE — Progress Notes (Signed)
Subjective:    Patient ID: Tom [name], male    DOB: February 26, 1966, 55 y.o.   MRN: [id number]  Tom [name] is a 55 y.o. male presenting on 01/27/2021 for Annual Exam   HPI  Here for Annual Physical and Lab Review.  Update continues to taper off Cymbalta down to 1 pill every 2 days.  Elevated Liver Enzymes AST 77 Admits some alcohol intake regularly with beer Now improved reduced amount In 2021, drank more alcohol had higher LFTs  Pre-Diabetes Elevated mild A1c 5.7 He admits poor diet with inc carbs potatoes  HYPERLIPIDEMIA: - Reports no concerns. Last lipid panel controlled - Currently taking Atorvastatin 38m, tolerating well without side effects or myalgias   CHRONIC HTN: Reports he has had high blood pressure for several years and has been managed by prior PCP. He was on Lisinopril and Chlorthalidone in past, and then had side effects ill on Lisinopril, switched to Losartan. Now within past 6 months he was on Amlodipine trial and it made him sick as well he came off of this. Now BP is improved. Current Meds - Chlorthalidone 282mdaily, Losartan 2575maily   Reports good compliance, took meds today. Tolerating well, w/o complaints. 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   Health Maintenance:  COVID19 x 2 doses. No booster.  Decline Flu.  PSA negative 1.17  Fam history father with Colon CA. He has done Cologuard years ago negative.  [facility]  Decreased Interest 0  Down, Depressed, Hopeless 0  PHQ - 2 Score 0  Altered sleeping 0  Tired, decreased energy 0  Change in appetite 0  Feeling bad or failure about yourself  0  Trouble  concentrating 0  Moving slowly or fidgety/restless 0  Suicidal thoughts 0  PHQ-9 Score 0  Difficult doing work/chores Not difficult at all    Past Medical History:  Diagnosis Date   Arthritis    COPD (chronic obstructive pulmonary disease) (HCC)    Hyperlipidemia    Hypertension    Osteoporosis    Past Surgical History:  Procedure Laterality Date   ANKLE FUSION Right 2017   BACK SURGERY     TOTAL KNEE ARTHROPLASTY Right 04/2012   Social History   Socioeconomic History   Marital status: Single    Spouse name: Not on file   Number of children: Not on file   Years of education: Not on file   Highest education level: Not on file  Occupational History   Not on file  Tobacco Use   Smoking status: Former    Types: Cigarettes    Quit date: 2019    Years since quitting: 3.6   Smokeless tobacco: Never  Vaping Use   Vaping Use: Never used  Substance and Sexual Activity   Alcohol use: No   Drug use: Not on file   Sexual activity: Not on file  Other Topics Concern   Not on file  Social History Narrative   Not on file   Social Determinants of Health   Financial Resource Strain: Not on file  Food Insecurity: Not on file  Transportation Needs: Not on file  Physical Activity: Not on file  Stress: Not on file  Social Connections: Not on file  Intimate Partner Violence: Not on file   History reviewed. No pertinent family history. Current Outpatient Medications on File Prior to Visit  Medication Sig   Multiple Vitamin (MULTIVITAMIN) capsule Take 1 capsule by mouth daily.   SPIRIVA HANDIHALER 18 MCG inhalation capsule 1 capsule daily.   No current facility-administered medications on file prior to visit.    Review of Systems  Constitutional:  Negative for activity change, appetite change, chills, diaphoresis, fatigue and fever.  HENT:  Negative for congestion and hearing loss.   Eyes:  Negative for visual disturbance.  Respiratory:  Negative for cough, chest  tightness, shortness of breath and wheezing.   Cardiovascular:  Negative for chest pain, palpitations and leg swelling.  Gastrointestinal:  Negative for abdominal pain, constipation, diarrhea, nausea and vomiting.  Genitourinary:  Negative for dysuria, frequency and hematuria.  Musculoskeletal:  Negative for arthralgias and neck pain.  Skin:  Negative for rash.  Neurological:  Negative for dizziness, weakness, light-headedness, numbness and headaches.  Hematological:  Negative for adenopathy.  Psychiatric/Behavioral:  Negative for behavioral problems, dysphoric mood and sleep disturbance.   Per HPI unless specifically indicated above      Objective:    BP 126/74   Pulse (!) 101   Ht 5' 11.5" (1.816 m)   Wt 253 lb 12.8 oz (115.1 kg)   SpO2 94%   BMI 34.90 kg/m   Wt Readings from Last 3 Encounters:  01/27/21 253 lb 12.8 oz (115.1 kg)  12/16/20 244 lb 9.6 oz (110.9 kg)  03/06/15 200 lb (90.7 kg)    Physical Exam Vitals and nursing note reviewed.  Constitutional:      General: He is not in acute distress.    Appearance: He is well-developed. He is not diaphoretic.     Comments: Well-appearing, comfortable, cooperative  HENT:     Head: Normocephalic and atraumatic.  Eyes:     General:        Right eye: No discharge.        Left eye: No discharge.     Conjunctiva/sclera: Conjunctivae normal.     Pupils: Pupils are equal, round, and reactive to light.  Neck:     Thyroid: No thyromegaly.  Cardiovascular:     Rate and Rhythm: Normal rate and regular rhythm.     Pulses: Normal pulses.     Heart sounds: Normal heart sounds. No murmur heard. Pulmonary:     Effort: Pulmonary effort is normal. No respiratory distress.     Breath sounds: Normal breath sounds. No wheezing or rales.  Abdominal:     General: Bowel sounds are normal. There is no distension.     Palpations: Abdomen is soft. There is no mass.     Tenderness: There is no abdominal tenderness.  Musculoskeletal:         General: No tenderness. Normal range of motion.     Cervical back: Normal range of motion and neck supple.     Comments: Upper / Lower Extremities: - Normal muscle tone, strength bilateral upper extremities 5/5, lower extremities 5/5  Lymphadenopathy:     Cervical: No cervical adenopathy.  Skin:    General: Skin is warm and dry.     Findings: No erythema or rash.  Neurological:     Mental Status: He is alert and oriented to person, place, and time.     Comments: Distal sensation intact to light touch all extremities  Psychiatric:  Mood and Affect: Mood normal.        Behavior: Behavior normal.        Thought Content: Thought content normal.     Comments: Well groomed, good eye contact, normal speech and thoughts     Results for orders placed or performed in visit on 01/19/21  TSH  Result Value Ref Range   TSH 1.32 0.40 - 4.50 mIU/L  PSA  Result Value Ref Range   PSA 1.17 < OR = 4.00 ng/mL  Hemoglobin A1c  Result Value Ref Range   Hgb A1c MFr Bld 5.7 (H) <5.7 % of total Hgb   Mean Plasma Glucose 117 mg/dL   eAG (mmol/L) 6.5 mmol/L  Lipid panel  Result Value Ref Range   Cholesterol 98 <200 mg/dL   HDL 30 (L) > OR = 40 mg/dL   Triglycerides 122 <150 mg/dL   LDL Cholesterol (Calc) 48 mg/dL (calc)   Total CHOL/HDL Ratio 3.3 <5.0 (calc)   Non-HDL Cholesterol (Calc) 68 <130 mg/dL (calc)  CBC with Differential/Platelet  Result Value Ref Range   WBC 8.5 3.8 - 10.8 Thousand/uL   RBC 3.87 (L) 4.20 - 5.80 Million/uL   Hemoglobin 13.1 (L) 13.2 - 17.1 g/dL   HCT 40.6 38.5 - 50.0 %   MCV 104.9 (H) 80.0 - 100.0 fL   MCH 33.9 (H) 27.0 - 33.0 pg   MCHC 32.3 32.0 - 36.0 g/dL   RDW 12.6 11.0 - 15.0 %   Platelets 240 140 - 400 Thousand/uL   MPV 11.5 7.5 - 12.5 fL   Neutro Abs 4,811 1,500 - 7,800 cells/uL   Lymphs Abs 2,210 850 - 3,900 cells/uL   Absolute Monocytes 1,156 (H) 200 - 950 cells/uL   Eosinophils Absolute 230 15 - 500 cells/uL   Basophils Absolute 94 0 - 200  cells/uL   Neutrophils Relative % 56.6 %   Total Lymphocyte 26.0 %   Monocytes Relative 13.6 %   Eosinophils Relative 2.7 %   Basophils Relative 1.1 %  COMPLETE METABOLIC PANEL WITH GFR  Result Value Ref Range   Glucose, Bld 132 (H) 65 - 99 mg/dL   BUN 17 7 - 25 mg/dL   Creat 1.16 0.70 - 1.30 mg/dL   eGFR 74 > OR = 60 mL/min/1.61m   BUN/Creatinine Ratio NOT APPLICABLE 6 - 22 (calc)   Sodium 141 135 - 146 mmol/L   Potassium 4.1 3.5 - 5.3 mmol/L   Chloride 103 98 - 110 mmol/L   CO2 30 20 - 32 mmol/L   Calcium 8.8 8.6 - 10.3 mg/dL   Total Protein 6.2 6.1 - 8.1 g/dL   Albumin 3.0 (L) 3.6 - 5.1 g/dL   Globulin 3.2 1.9 - 3.7 g/dL (calc)   AG Ratio 0.9 (L) 1.0 - 2.5 (calc)   Total Bilirubin 0.5 0.2 - 1.2 mg/dL   Alkaline phosphatase (APISO) 106 35 - 144 U/L   AST 77 (H) 10 - 35 U/L   ALT 38 9 - 46 U/L      Assessment & Plan:   Problem List Items Addressed This Visit     Essential hypertension   Cent[facility]obular emphysema ([facility]   Other Visit Diagnoses     Annual physical exam    -  Primary   Screening for colon cancer       Relevant Orders   Cologuard   Family history of colon cancer          Updated Health Maintenance information - Declines Flu  Vaccine - COVID19 vaccines x 2, declines booster - Declines Shingrix Reviewed recent lab results with patient Encouraged improvement to lifestyle with diet and exercise Goal of weight loss  Advised would recommend colonoscopy given fam history, however we agree to trial Cologuard as first option, then follow up results.  Emphysema COPD Follow up in future consider which maintenance therapy would be covered, at future visit consider Trelegy vs Breztri or other options.  Orders Placed This Encounter  Procedures   Cologuard       No orders of the defined types were placed in this encounter.    Follow up plan: Return in about 3 months (around 04/29/2021) for 3 month follow-up COPD, HTN.  Nobie Putnam,  [facility] 01/27/2021, 1:58 PM

## 2021-01-27 NOTE — Patient Instructions (Addendum)
Thank you for coming to the office today.  Recent Labs    01/20/21 0849  HGBA1C 5.7*   Keep improving low carb low starch diet.  ------  Reduce Alcohol intake. To help liver.  ------   Check with Insurance on Inhaler options  Trelegy (says not covered) Breztri  Others not quite as strong as those include Symbicort, Breo, Dulera, Advair  Colon Cancer Screening: - For all adults age 57+ routine colon cancer screening is highly recommended.     - Recent guidelines from American [employer] Society recommend starting age of 84 - Early detection of colon cancer is important, because often there are no warning signs or symptoms, also if found early usually it can be cured. Late stage is hard to treat.  - If you are not interested in Colonoscopy screening (if done and normal you could be cleared for 5 to 10 years until next due), then Cologuard is an excellent alternative for screening test for Colon Cancer. It is highly sensitive for detecting DNA of colon cancer from even the earliest stages. Also, there is NO bowel prep required. - If Cologuard is NEGATIVE, then it is good for 3 years before next due - If Cologuard is POSITIVE, then it is strongly advised to get a Colonoscopy, which allows the GI doctor to locate the source of the cancer or polyp (even very early stage) and treat it by removing it. ------------------------- Follow instructions to collect sample, you may call the company for any help or questions, 24/7 telephone support at [phone].   Please schedule a Follow-up Appointment to: Return in about 3 months (around 04/29/2021) for 3 month follow-up COPD, HTN.  If you have any other questions or concerns, please feel free to call the office or send a message through MyChart. You may also schedule an earlier appointment if necessary.  Additionally, you may be receiving a survey about your experience at our office within a few days to 1 week by e-mail or mail. We value  your feedback.  Saralyn Pilar, DO [facility], [place]

## 2021-02-16 DIAGNOSIS — H5213 Myopia, bilateral: Secondary | ICD-10-CM | POA: Diagnosis not present

## 2021-03-15 DIAGNOSIS — H5203 Hypermetropia, bilateral: Secondary | ICD-10-CM | POA: Diagnosis not present

## 2021-03-17 ENCOUNTER — Ambulatory Visit: Payer: Medicaid Other | Admitting: Family Medicine

## 2021-03-17 ENCOUNTER — Encounter: Payer: Self-pay | Admitting: Family Medicine

## 2021-03-17 ENCOUNTER — Other Ambulatory Visit: Payer: Self-pay

## 2021-03-17 ENCOUNTER — Ambulatory Visit (INDEPENDENT_AMBULATORY_CARE_PROVIDER_SITE_OTHER): Payer: Medicaid Other | Admitting: Family Medicine

## 2021-03-17 VITALS — BP 115/70 | HR 98 | Ht 71.5 in | Wt 266.2 lb

## 2021-03-17 DIAGNOSIS — J441 Chronic obstructive pulmonary disease with (acute) exacerbation: Secondary | ICD-10-CM

## 2021-03-17 DIAGNOSIS — J432 Centrilobular emphysema: Secondary | ICD-10-CM | POA: Diagnosis not present

## 2021-03-17 MED ORDER — TRELEGY ELLIPTA 100-62.5-25 MCG/INH IN AEPB: 1.0000 | INHALATION_SPRAY | Freq: Every day | RESPIRATORY_TRACT | 5 refills | Status: DC

## 2021-03-17 MED ORDER — LEVOFLOXACIN 500 MG PO TABS: 500.0000 mg | ORAL_TABLET | Freq: Every day | ORAL | 0 refills | Status: DC

## 2021-03-17 MED ORDER — PREDNISONE 20 MG PO TABS: ORAL_TABLET | ORAL | 0 refills | Status: DC

## 2021-03-17 NOTE — Progress Notes (Signed)
Subjective:    Patient ID: Tom [name], male    DOB: Apr 26, 1966, 55 y.o.   MRN: [id number]  Tom [name] is a 55 y.o. male presenting on 03/17/2021 for COPD   HPI  Acute COPD Exacerbation Centrilobular Emphysema Former smoker, history tobacco abuse Reports new onset past days and weeks + with productive thicker phlegm cough and dyspnea worse in AM and PM. Using albuterol very frequently in past few months, run out early. Has Spiriva limited relief. Has not seen Pulm. Interested in [place] and other inhaler therapy. Not on oxygen. Denies fever chills nausea vomiting    Depression screen [facility] 2/9 12/16/2020  Decreased Interest 0  Down, Depressed, Hopeless 0  PHQ - 2 Score 0  Altered sleeping 0  Tired, decreased energy 0  Change in appetite 0  Feeling bad or failure about yourself  0  Trouble concentrating 0  Moving slowly or fidgety/restless 0  Suicidal thoughts 0  PHQ-9 Score 0  Difficult doing work/chores Not difficult at all    Social History   Tobacco Use   Smoking status: Former    Types: Cigarettes    Quit date: 2019    Years since quitting: 3.7   Smokeless tobacco: Never  Vaping Use   Vaping Use: Never used  Substance Use Topics   Alcohol use: No    Review of Systems Per HPI unless specifically indicated above     Objective:    BP 115/70   Pulse 98   Ht 5' 11.5" (1.816 m)   Wt 266 lb 3.2 oz (120.7 kg)   SpO2 97%   BMI 36.61 kg/m   Wt Readings from Last 3 Encounters:  03/17/21 266 lb 3.2 oz (120.7 kg)  01/27/21 253 lb 12.8 oz (115.1 kg)  12/16/20 244 lb 9.6 oz (110.9 kg)    Physical Exam Vitals and nursing note reviewed.  Constitutional:      General: He is not in acute distress.    Appearance: He is well-developed. He is not diaphoretic.     Comments: Well-appearing, comfortable, cooperative  HENT:     Head: Normocephalic and atraumatic.  Eyes:     General:        Right eye: No discharge.        Left eye: No discharge.      Conjunctiva/sclera: Conjunctivae normal.  Neck:     Thyroid: No thyromegaly.  Cardiovascular:     Rate and Rhythm: Normal rate and regular rhythm.     Pulses: Normal pulses.     Heart sounds: Normal heart sounds. No murmur heard. Pulmonary:     Effort: Pulmonary effort is normal. No respiratory distress.     Breath sounds: No wheezing or rales.     Comments: Mild coarse breath sounds Musculoskeletal:        General: Normal range of motion.     Cervical back: Normal range of motion and neck supple.  Lymphadenopathy:     Cervical: No cervical adenopathy.  Skin:    General: Skin is warm and dry.     Findings: No erythema or rash.  Neurological:     Mental Status: He is alert and oriented to person, place, and time. Mental status is at baseline.  Psychiatric:        Behavior: Behavior normal.     Comments: Well groomed, good eye contact, normal speech and thoughts      Results for orders placed or performed in visit on 01/19/21  TSH  Result Value Ref Range   TSH 1.32 0.40 - 4.50 mIU/L  PSA  Result Value Ref Range   PSA 1.17 < OR = 4.00 ng/mL  Hemoglobin A1c  Result Value Ref Range   Hgb A1c MFr Bld 5.7 (H) <5.7 % of total Hgb   Mean Plasma Glucose 117 mg/dL   eAG (mmol/L) 6.5 mmol/L  Lipid panel  Result Value Ref Range   Cholesterol 98 <200 mg/dL   HDL 30 (L) > OR = 40 mg/dL   Triglycerides 122 <150 mg/dL   LDL Cholesterol (Calc) 48 mg/dL (calc)   Total CHOL/HDL Ratio 3.3 <5.0 (calc)   Non-HDL Cholesterol (Calc) 68 <130 mg/dL (calc)  CBC with Differential/Platelet  Result Value Ref Range   WBC 8.5 3.8 - 10.8 Thousand/uL   RBC 3.87 (L) 4.20 - 5.80 Million/uL   Hemoglobin 13.1 (L) 13.2 - 17.1 g/dL   HCT 40.6 38.5 - 50.0 %   MCV 104.9 (H) 80.0 - 100.0 fL   MCH 33.9 (H) 27.0 - 33.0 pg   MCHC 32.3 32.0 - 36.0 g/dL   RDW 12.6 11.0 - 15.0 %   Platelets 240 140 - 400 Thousand/uL   MPV 11.5 7.5 - 12.5 fL   Neutro Abs 4,811 1,500 - 7,800 cells/uL   Lymphs Abs 2,210  850 - 3,900 cells/uL   Absolute Monocytes 1,156 (H) 200 - 950 cells/uL   Eosinophils Absolute 230 15 - 500 cells/uL   Basophils Absolute 94 0 - 200 cells/uL   Neutrophils Relative % 56.6 %   Total Lymphocyte 26.0 %   Monocytes Relative 13.6 %   Eosinophils Relative 2.7 %   Basophils Relative 1.1 %  COMPLETE METABOLIC PANEL WITH GFR  Result Value Ref Range   Glucose, Bld 132 (H) 65 - 99 mg/dL   BUN 17 7 - 25 mg/dL   Creat 1.16 0.70 - 1.30 mg/dL   eGFR 74 > OR = 60 mL/min/1.77m   BUN/Creatinine Ratio NOT APPLICABLE 6 - 22 (calc)   Sodium 141 135 - 146 mmol/L   Potassium 4.1 3.5 - 5.3 mmol/L   Chloride 103 98 - 110 mmol/L   CO2 30 20 - 32 mmol/L   Calcium 8.8 8.6 - 10.3 mg/dL   Total Protein 6.2 6.1 - 8.1 g/dL   Albumin 3.0 (L) 3.6 - 5.1 g/dL   Globulin 3.2 1.9 - 3.7 g/dL (calc)   AG Ratio 0.9 (L) 1.0 - 2.5 (calc)   Total Bilirubin 0.5 0.2 - 1.2 mg/dL   Alkaline phosphatase (APISO) 106 35 - 144 U/L   AST 77 (H) 10 - 35 U/L   ALT 38 9 - 46 U/L      Assessment & Plan:   Problem List Items Addressed This Visit     Centrilobular emphysema (HCC)   Relevant Medications   predniSONE (DELTASONE) 20 MG tablet   TRELEGY ELLIPTA 100-62.5-25 MCG/INH AEPB   Other Relevant Orders   Ambulatory referral to Pulmonology   Other Visit Diagnoses     COPD with acute exacerbation ([place]    -  Primary   Relevant Medications   levofloxacin (LEVAQUIN) 500 MG tablet   predniSONE (DELTASONE) 20 MG tablet   TRELEGY ELLIPTA 100-62.5-25 MCG/INH AEPB   Other Relevant Orders   Ambulatory referral to Pulmonology       Acute on chronic COPD Exacerbation Centrilobular emphysema vs Chronic Bronchitis recurrent  No acute wheezing now, but has persistent breakthrough symptoms Failed Spiriva, Albuterol  Treat AECOPD today Start  taking Levaquin antibiotic 526m daily x 7 days, caution with FQ risk of tendon muscle injury Start Prednisone taper 7 day Return if worse or new concern  Sample  Breztri today in office 2 puff BID for 1 week, already ORDERED Trelegy 1 puff daily for triple therapy maintenance, pending insurance cost/coverage  [facility] going forward given severity and worsening function.   Meds ordered this encounter  Medications   levofloxacin (LEVAQUIN) 500 MG tablet    Sig: Take 1 tablet (500 mg total) by mouth daily. For 7 days    Dispense:  7 tablet    Refill:  0   predniSONE (DELTASONE) 20 MG tablet    Sig: Take daily with food. Start with 653m(3 pills) x 2 days, then reduce to 4048m2 pills) x 2 days, then 55m74m pill) x 3 days    Dispense:  13 tablet    Refill:  0   TRELEGY ELLIPTA 100-62.5-25 MCG/INH AEPB    Sig: Inhale 1 puff into the lungs daily.    Dispense:  60 each    Refill:  5    Orders Placed This Encounter  Procedures   Ambulatory referral to Pulmonology    Referral Priority:   Routine    Referral Type:   Consultation    Referral Reason:   Specialty Services Required    Requested Specialty:   Pulmonary Disease    Number of Visits Requested:   1     Follow up plan: Return if symptoms worsen or fail to improve, for keep upcoming apt.   AlexNobie Putnam [facility] 03/17/2021, 11:47 AM

## 2021-03-17 NOTE — Patient Instructions (Addendum)
Thank you for coming to the office today.  Sart new Trelegy inhaler 1 puff a day if we can get it approved Stop Spiriva  Start taking Levaquin antibiotic 500mg  daily x 7 days (caution with muscle injury)  Start Prednisone taper over 7 days  Use albuterol as needed  [facility] [address] [place], [place] Phone: [phone]  Stay tuned for referral.  Please schedule a Follow-up Appointment to: Return if symptoms worsen or fail to improve, for keep upcoming apt.  If you have any other questions or concerns, please feel free to call the office or send a message through MyChart. You may also schedule an earlier appointment if necessary.  Additionally, you may be receiving a survey about your experience at our office within a few days to 1 week by e-mail or mail. We value your feedback.  [phone], DO [facility], [facility]

## 2021-03-24 ENCOUNTER — Ambulatory Visit: Payer: Medicaid Other | Admitting: Family Medicine

## 2021-03-25 DIAGNOSIS — M25532 Pain in left wrist: Secondary | ICD-10-CM | POA: Insufficient documentation

## 2021-04-13 ENCOUNTER — Ambulatory Visit (INDEPENDENT_AMBULATORY_CARE_PROVIDER_SITE_OTHER): Payer: Medicaid Other | Admitting: Internal Medicine

## 2021-04-13 ENCOUNTER — Other Ambulatory Visit: Payer: Self-pay

## 2021-04-13 ENCOUNTER — Ambulatory Visit
Admission: RE | Admit: 2021-04-13 | Discharge: 2021-04-13 | Disposition: A | Discharge status: Home / Self Care | Payer: Medicaid Other | Source: Ambulatory Visit | Attending: Internal Medicine | Admitting: Internal Medicine

## 2021-04-13 ENCOUNTER — Encounter: Payer: Self-pay | Admitting: Internal Medicine

## 2021-04-13 ENCOUNTER — Other Ambulatory Visit
Admission: RE | Admit: 2021-04-13 | Discharge: 2021-04-13 | Disposition: A | Discharge status: Home / Self Care | Payer: Medicaid Other | Source: Ambulatory Visit | Attending: Internal Medicine | Admitting: Internal Medicine

## 2021-04-13 DIAGNOSIS — R911 Solitary pulmonary nodule: Secondary | ICD-10-CM | POA: Diagnosis not present

## 2021-04-13 DIAGNOSIS — R0609 Other forms of dyspnea: Secondary | ICD-10-CM

## 2021-04-13 DIAGNOSIS — R059 Cough, unspecified: Secondary | ICD-10-CM | POA: Diagnosis not present

## 2021-04-13 DIAGNOSIS — R079 Chest pain, unspecified: Secondary | ICD-10-CM | POA: Diagnosis not present

## 2021-04-13 LAB — BASIC METABOLIC PANEL
Anion gap: 9 (ref 5–15)
BUN: 22 mg/dL — ABNORMAL HIGH (ref 6–20)
CO2: 26 mmol/L (ref 22–32)
Calcium: 8.8 mg/dL — ABNORMAL LOW (ref 8.9–10.3)
Chloride: 104 mmol/L (ref 98–111)
Creatinine, Ser: 1.01 mg/dL (ref 0.61–1.24)
GFR, Estimated: >60 mL/min (ref >60)
Glucose, Bld: 108 mg/dL — ABNORMAL HIGH (ref 70–99)
Potassium: 3.8 mmol/L (ref 3.5–5.1)
Sodium: 139 mmol/L (ref 135–145)

## 2021-04-13 LAB — CBC WITH DIFFERENTIAL/PLATELET
Abs Immature Granulocytes: 0.02 10*3/uL (ref 0.00–0.07)
Basophils Absolute: 0.1 10*3/uL (ref 0.0–0.1)
Basophils Relative: 1 %
Eosinophils Absolute: 0.4 10*3/uL (ref 0.0–0.5)
Eosinophils Relative: 4 %
HCT: 41.5 % (ref 39.0–52.0)
Hemoglobin: 14.3 g/dL (ref 13.0–17.0)
Immature Granulocytes: 0 %
Lymphocytes Relative: 24 %
Lymphs Abs: 2.3 10*3/uL (ref 0.7–4.0)
MCH: 33.6 pg (ref 26.0–34.0)
MCHC: 34.5 g/dL (ref 30.0–36.0)
MCV: 97.6 fL (ref 80.0–100.0)
Monocytes Absolute: 1.5 10*3/uL — ABNORMAL HIGH (ref 0.1–1.0)
Monocytes Relative: 15 %
Neutro Abs: 5.4 10*3/uL (ref 1.7–7.7)
Neutrophils Relative %: 56 %
Platelets: 219 10*3/uL (ref 150–400)
RBC: 4.25 MIL/uL (ref 4.22–5.81)
RDW: 13.2 % (ref 11.5–15.5)
WBC: 9.7 10*3/uL (ref 4.0–10.5)
nRBC: 0 % (ref 0.0–0.2)

## 2021-04-13 LAB — D-DIMER, QUANTITATIVE: D-Dimer, Quant: 1.13 ug/mL-FEU — ABNORMAL HIGH (ref 0.00–0.50)

## 2021-04-13 LAB — TSH: TSH: 1.78 u[IU]/mL (ref 0.350–4.500)

## 2021-04-13 LAB — BRAIN NATRIURETIC PEPTIDE: B Natriuretic Peptide: 46.2 pg/mL (ref 0.0–100.0)

## 2021-04-13 MED ORDER — PREDNISONE 10 MG PO TABS: ORAL_TABLET | ORAL | 0 refills | Status: DC

## 2021-04-13 MED ORDER — PANTOPRAZOLE SODIUM 40 MG PO TBEC: 40.0000 mg | DELAYED_RELEASE_TABLET | Freq: Every day | ORAL | 2 refills | Status: DC

## 2021-04-13 MED ORDER — FAMOTIDINE 20 MG PO TABS: ORAL_TABLET | ORAL | 11 refills | Status: DC

## 2021-04-13 MED ORDER — BUDESONIDE-FORMOTEROL FUMARATE 80-4.5 MCG/ACT IN AERO: INHALATION_SPRAY | RESPIRATORY_TRACT | 12 refills | Status: DC

## 2021-04-13 NOTE — Progress Notes (Signed)
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     Also denies any obvious fluctuation of symptoms with weather or environmental changes or other aggravating or alleviating factors except as outlined above   No unusual exposure hx or h/o childhood pna/ asthma or knowledge of premature birth.  Current Allergies, Complete Past Medical History, Past Surgical History, Family History, and Social History were reviewed in [employer] record.  ROS  The following are not active complaints unless bolded Hoarseness, sore throat, dysphagia, dental problems, itching, sneezing,  nasal congestion or discharge of excess mucus or purulent secretions, ear ache,   fever, chills, sweats, unintended wt loss or wt gain, classically pleuritic or exertional cp,  orthopnea pnd or arm/hand swelling  or leg swelling, presyncope, palpitations, abdominal pain, anorexia, nausea, vomiting, diarrhea  or change in bowel habits or change in bladder habits, change in stools or change in urine, dysuria, hematuria,   rash, arthralgias, visual complaints, headache, numbness, weakness or ataxia or problems with walking or coordination,  change in mood or  memory.           Past Medical History:  Diagnosis Date   Arthritis    COPD (chronic obstructive pulmonary disease) (HCC)    Hyperlipidemia    Hypertension    Osteoporosis     Outpatient Medications Prior to Visit  Medication Sig Dispense Refill   albuterol (VENTOLIN HFA) 108 (90 Base) MCG/ACT inhaler Inhale 1-2 puffs into the lungs every 4 (four) hours as needed for wheezing or shortness of breath. 6.7 g 3   atorvastatin (LIPITOR) 40 MG tablet Take 1 tablet (40 mg total) by mouth daily. 90 tablet 3   chlorthalidone (HYGROTON) 25 MG tablet Take 1 tablet (25 mg total) by mouth daily. 90 tablet 3   levofloxacin (LEVAQUIN) 500 MG tablet Take 1 tablet (500 mg total) by mouth daily. For 7 days 7 tablet 0   losartan (COZAAR) 25 MG tablet Take 1 tablet (25 mg total) by mouth daily. 90 tablet 3   Multiple Vitamin (MULTIVITAMIN) capsule Take 1 capsule by mouth daily.     potassium chloride SA (KLOR-CON) 20 MEQ tablet Take 1 tablet (20 mEq total) by mouth daily. 90 tablet 3   predniSONE (DELTASONE) 20 MG tablet Take daily with food. Start with 60mg  (3 pills) x 2 days, then reduce to 40mg  (2 pills) x 2 days,  then 20mg  (1 pill) x 3 days 13 tablet 0   TRELEGY ELLIPTA 100-62.5-25 MCG/INH AEPB Inhale 1 puff into the lungs daily. 60 each 5   No facility-administered medications prior to visit.     Objective:     BP 128/82 (BP Location: Left Arm, Patient Position: Sitting, Cuff Size: Normal)   Pulse 94   Temp 98.2 F (36.8 C) (Oral)   Ht 6' (1.829 m)   Wt 267 lb 12.8 oz (121.5 kg)   SpO2 96%   BMI 36.32 kg/m   SpO2: 96 %  Amb pleasant wm mildly congested sounding spont cough   HEENT : pt wearing mask not removed for exam due to covid - 19 concerns.   NECK :  without JVD/Nodes/TM/ nl carotid upstrokes bilaterally   LUNGS: no acc muscle use,  Min  barrel  contour chest wall with bilateral  slightly decreased bs s audible wheeze and  without cough on insp or exp maneuvers and min  Hyperresonant  to  percussion bilaterally     CV:  RRR  no s3 or murmur or increase in P2, and trace edema on R > L    ABD: quite obese but  soft and nontender with pos end  insp Hoover's  in the supine position. No bruits or organomegaly appreciated, bowel sounds nl  MS:   Nl gait/  ext warm without deformities, calf tenderness, cyanosis or clubbing No obvious joint restrictions   SKIN: warm and dry without lesions    NEURO:  alert, approp, nl sensorium with  no motor or cerebellar deficits apparent.      CXR PA and Lateral:   04/13/2021 :    I personally reviewed images /impression as follows:    Nl lung vol, extensive calcified granulomas bilaterally   Labs ordered/ reviewed:      Chemistry      Component Value Date/Time   NA 139 04/13/2021 1653   K 3.8 04/13/2021 1653   CL 104 04/13/2021 1653   CO2 26 04/13/2021 1653   BUN 22 (H) 04/13/2021 1653   CREATININE 1.01 04/13/2021 1653   CREATININE 1.16 01/20/2021 0849      Component Value Date/Time   CALCIUM 8.8 (L) 04/13/2021 1653   AST 77 (H) 01/20/2021 0849   ALT 38 01/20/2021 0849   BILITOT 0.5 01/20/2021 0849        Lab Results  Component Value Date   WBC 9.7 04/13/2021   HGB 14.3 04/13/2021   HCT 41.5 04/13/2021   MCV 97.6 04/13/2021   PLT 219 04/13/2021     Lab Results  Component Value Date   DDIMER 1.13 (H) 04/13/2021      Lab Results  Component Value Date   TSH 1.780 04/13/2021      BNP  04/13/2021   =  46       Assessment   DOE (dyspnea on exertion) Quit smoking 2017  - 04/13/2021  After extensive coaching inhaler device,  effectiveness =    75% from a baseline of nearly 0 > try symbicort 80 2bid   Symptoms are markedly disproportionate to objective findings and not clear to what extent this is actually a pulmonary  problem but pt does appear to have  difficult to sort out respiratory symptoms of unknown origin for which  DDX  = almost all start with A and  include Adherence, Ace Inhibitors, Acid Reflux, Active Sinus Disease, Alpha 1 Antitripsin deficiency, Anxiety masquerading as Airways dz,  ABPA,  Allergy(esp in young), Aspiration (esp in elderly), Adverse effects of meds,  Active smoking or Vaping, A bunch of PE's/clot burden (a few small clots can't cause this syndrome unless there is already severe underlying pulm or vascular dz with poor reserve),  Anemia or thyroid disorder, plus two Bs  = Bronchiectasis and Beta blocker use..and one C= CHF     Adherence is always the initial "prime suspect" and is a multilayered concern that requires a "trust but verify" approach in every patient - starting with knowing how to use medications, especially inhalers, correctly, keeping up with refills and understanding the fundamental difference between maintenance and prns vs those medications only taken for a very short course and then stopped and not refilled.  - see hfa teaching above - return with all meds in hand using a trust but verify approach to confirm accurate Medication  Reconciliation The principal here is that until we are certain that the  patients are doing what we've asked, it makes no sense to ask them to do more.   ? Acid (or non-acid) GERD > always difficult to exclude as up to 75% of pts in some series report no assoc GI/ Heartburn symptoms> rec max (24h)  acid suppression and diet restrictions/ reviewed and instructions given in writing.   ? Allergy/asthma > check profile/Prednisone 10 mg take  4 each am x 2 days,   2 each am x 2 days,  1 each am x 2 days and stop/  start symbicort 80 2bid   ? Adverse drug effects > avoid dpi's in setting of assoc cough > d/c trelegy   ? Alpha one AT def > send phenotype  ? Anemia/ thyroid dz >  Ruled out   ? A bunch of PEs >  D dimer   high normal value (seen commonly in the elderly or chronically  ill)  may miss small peripheral pe, the clot burden with sob is moderately high and the d dimer  has a very high neg pred value if used in this setting. >>> will do venous dopplers since slt asym to chronic leg edema  ? chf > excluded by bnp / cxr   >>> f/u in 6 weeks and set up pfts then.          Each maintenance medication was reviewed in detail including emphasizing most importantly the difference between maintenance and prns and under what circumstances the prns are to be triggered using an action plan format where appropriate.  Total time for H and P, chart review, counseling, reviewing hfa device(s) and generating customized AVS unique to this office visit / same day charting = 30 min           Sandrea Hughs, MD 04/13/2021

## 2021-04-13 NOTE — Patient Instructions (Signed)
Plan A = Automatic = Always=    Symbicort Take 2 puffs first thing in am and then another 2 puffs about 12 hours later.    Work on inhaler technique:  relax and gently blow all the way out then take a nice smooth full deep breath back in, triggering the inhaler at same time you start breathing in.  Hold for up to 5 seconds if you can. Blow out thru nose. Rinse and gargle with water when done.  If mouth or throat bother you at all,  try brushing teeth/gums/tongue with arm and hammer toothpaste/ make a slurry and gargle and spit out.   Prednisone 10 mg take  4 each am x 2 days,   2 each am x 2 days,  1 each am x 2 days and stop   Pantoprazole (protonix) 40 mg   Take  30-60 min before first meal of the day and Pepcid (famotidine)  20 mg after supper until return to office - this is the best way to tell whether stomach acid is contributing to your problem.     Plan B = Backup (to supplement plan A, not to replace it) Only use your albuterol inhaler as a rescue medication to be used if you can't catch your breath by resting or doing a relaxed purse lip breathing pattern.  - The less you use it, the better it will work when you need it. - Ok to use the inhaler up to 2 puffs  every 4 hours if you must but call for appointment if use goes up over your usual need - Don't leave home without it !!  (think of it like the spare tire for your car)     GERD (REFLUX)  is an extremely common cause of respiratory symptoms just like yours , many times with no obvious heartburn at all.    It can be treated with medication, but also with lifestyle changes including elevation of the head of your bed (ideally with 6 -8inch blocks under the headboard of your bed),  Smoking cessation, avoidance of late meals, excessive alcohol, and avoid fatty foods, chocolate, peppermint, colas, red wine, and acidic juices such as orange juice.  NO MINT OR MENTHOL PRODUCTS SO NO COUGH DROPS  USE SUGARLESS CANDY INSTEAD (Jolley ranchers  or Stover's or Life Savers) or even ice chips will also do - the key is to swallow to prevent all throat clearing. NO OIL BASED VITAMINS - use powdered substitutes.  Avoid fish oil when coughing.   Please remember to go to the lab and x-ray department  for your tests - we will call you with the results when they are available.        Please schedule a follow up office visit in 6 weeks, call sooner if needed

## 2021-04-14 ENCOUNTER — Telehealth: Payer: Self-pay | Admitting: Pulmonary Disease

## 2021-04-14 ENCOUNTER — Encounter: Payer: Self-pay | Admitting: Internal Medicine

## 2021-04-14 ENCOUNTER — Ambulatory Visit
Admission: RE | Admit: 2021-04-14 | Discharge: 2021-04-14 | Disposition: A | Discharge status: Home / Self Care | Payer: Medicaid Other | Source: Ambulatory Visit | Attending: Internal Medicine | Admitting: Internal Medicine

## 2021-04-14 ENCOUNTER — Telehealth: Payer: Self-pay

## 2021-04-14 DIAGNOSIS — R7989 Other specified abnormal findings of blood chemistry: Secondary | ICD-10-CM | POA: Insufficient documentation

## 2021-04-14 DIAGNOSIS — R0989 Other specified symptoms and signs involving the circulatory and respiratory systems: Secondary | ICD-10-CM

## 2021-04-14 DIAGNOSIS — R600 Localized edema: Secondary | ICD-10-CM | POA: Diagnosis not present

## 2021-04-14 NOTE — Assessment & Plan Note (Addendum)
Quit smoking 2017  - 04/13/2021  After extensive coaching inhaler device,  effectiveness =    75% from a baseline of nearly 0 > try symbicort 80 2bid   Symptoms are markedly disproportionate to objective findings and not clear to what extent this is actually a pulmonary  problem but pt does appear to have difficult to sort out respiratory symptoms of unknown origin for which  DDX  = almost all start with A and  include Adherence, Ace Inhibitors, Acid Reflux, Active Sinus Disease, Alpha 1 Antitripsin deficiency, Anxiety masquerading as Airways dz,  ABPA,  Allergy(esp in young), Aspiration (esp in elderly), Adverse effects of meds,  Active smoking or Vaping, A bunch of PE's/clot burden (a few small clots can't cause this syndrome unless there is already severe underlying pulm or vascular dz with poor reserve),  Anemia or thyroid disorder, plus two Bs  = Bronchiectasis and Beta blocker use..and one C= CHF     Adherence is always the initial "prime suspect" and is a multilayered concern that requires a "trust but verify" approach in every patient - starting with knowing how to use medications, especially inhalers, correctly, keeping up with refills and understanding the fundamental difference between maintenance and prns vs those medications only taken for a very short course and then stopped and not refilled.  - see hfa teaching above - return with all meds in hand using a trust but verify approach to confirm accurate Medication  Reconciliation The principal here is that until we are certain that the  patients are doing what we've asked, it makes no sense to ask them to do more.   ? Acid (or non-acid) GERD > always difficult to exclude as up to 75% of pts in some series report no assoc GI/ Heartburn symptoms> rec max (24h)  acid suppression and diet restrictions/ reviewed and instructions given in writing.   ? Allergy/asthma > check profile/Prednisone 10 mg take  4 each am x 2 days,   2 each am x 2 days,  1  each am x 2 days and stop/  start symbicort 80 2bid   ? Adverse drug effects > avoid dpi's in setting of assoc cough > d/c trelegy   ? Alpha one AT def > send phenotype  ? Anemia/ thyroid dz >  Ruled out   ? A bunch of PEs >  D dimer   high normal value (seen commonly in the elderly or chronically ill)  may miss small peripheral pe, the clot burden with sob is moderately high and the d dimer  has a very high neg pred value if used in this setting. >>> will do venous dopplers since slt asym to chronic leg edema  ? chf > excluded by bnp / cxr   >>> f/u in 6 weeks and set up pfts then.          Each maintenance medication was reviewed in detail including emphasizing most importantly the difference between maintenance and prns and under what circumstances the prns are to be triggered using an action plan format where appropriate.  Total time for H and P, chart review, counseling, reviewing hfa device(s) and generating customized AVS unique to this office visit / same day charting = 30 min

## 2021-04-14 NOTE — Telephone Encounter (Signed)
Call report from CXR on 04/13/21  EXAM: CHEST - 2 VIEW   COMPARISON:  None.   FINDINGS: Normal cardiac silhouette. There is mild nodular prominence of the bilateral pulmonary hila. Multiple punctate nodules are seen bilaterally with dominant nodule overlying the left lower lung measuring 0.7 cm and dominant nodule overlying the right upper lung measuring 0.6 cm. Minimal pleuroparenchymal thickening about the right minor and bilateral major fissures. No pleural effusion or pneumothorax. No evidence of edema. No acute osseous abnormalities. Post sideplate fixation of the right clavicle, incompletely evaluated. Degenerative change of the lower lumbar spine is suspected though incompletely evaluated.   IMPRESSION: Multiple bilateral punctate pulmonary nodules, potentially representative of granulomas, though incompletely evaluated the present examination. Correlation with prior outside examinations (if available), is advised. Otherwise, further evaluation with chest CT is recommended.   These results will be called to the ordering clinician or representative by the Radiologist Assistant, and communication documented in the PACS or [employer].    Dr. Tonia Brooms please advise as Dr. Sherene Sires is unavailable

## 2021-04-14 NOTE — Telephone Encounter (Signed)
Per Dr Sherene Sires, all labs were okay, except the the D-Dimer was slightly elevated he would like for patient to have a venous scan to rule out blood clot in legs. Order placed. Will route to [place] to schedule pt. Patient aware.

## 2021-04-15 NOTE — Telephone Encounter (Signed)
See result note.  

## 2021-04-15 NOTE — Telephone Encounter (Signed)
Cancel previous result note. Let pt know cxr shows old calcium deposits that don't cause any symptoms or concern at this point.  Spoke with pt and notified of results per Dr. Sherene Sires. Pt verbalized understanding and denied any questions.   He wanted me to let Dr Sherene Sires know that his brother has hemachromatosis. He was not sure if he told him this at [facility]. Forwarding to Dr Sherene Sires as Lorain Childes.

## 2021-04-15 NOTE — Progress Notes (Signed)
Spoke with pt and notified of results per Dr. Wert. Pt verbalized understanding and denied any questions. 

## 2021-04-19 LAB — ALPHA-1-ANTITRYPSIN PHENOTYP: A-1 Antitrypsin, Ser: 219 mg/dL — ABNORMAL HIGH (ref 101–187)

## 2021-04-20 ENCOUNTER — Telehealth: Payer: Self-pay | Admitting: Pulmonary Disease

## 2021-04-20 NOTE — Telephone Encounter (Signed)
Spoke to patient.  Patient is requesting lab results.  He stated that his family has a strong hx of hemochromatosis. He would like to be tested for this. Recommended that he contact PCP regarding hemochromatosis.  Dr. Sherene Sires, please advise on labs. Thanks

## 2021-04-20 NOTE — Telephone Encounter (Signed)
Patient is aware of results and voiced her understanding.  °Nothing further needed at this time.  ° °

## 2021-04-20 NOTE — Telephone Encounter (Signed)
I had already reviewed them and meant to convey they were are ok x for the d dimer which needed a venous doppler to close the loop and we did that

## 2021-04-21 LAB — IGE: IgE (Immunoglobulin E), Serum: 68 IU/mL (ref 6–495)

## 2021-04-29 ENCOUNTER — Other Ambulatory Visit: Payer: Self-pay

## 2021-04-29 ENCOUNTER — Emergency Department
Admission: EM | Admit: 2021-04-29 | Discharge: 2021-04-29 | Disposition: A | Discharge status: Home / Self Care | Payer: Medicaid Other | Attending: Emergency Medicine | Admitting: Emergency Medicine

## 2021-04-29 ENCOUNTER — Emergency Department: Payer: Medicaid Other

## 2021-04-29 DIAGNOSIS — I2510 Atherosclerotic heart disease of native coronary artery without angina pectoris: Secondary | ICD-10-CM | POA: Diagnosis not present

## 2021-04-29 DIAGNOSIS — M546 Pain in thoracic spine: Secondary | ICD-10-CM | POA: Insufficient documentation

## 2021-04-29 DIAGNOSIS — Z7951 Long term (current) use of inhaled steroids: Secondary | ICD-10-CM | POA: Diagnosis not present

## 2021-04-29 DIAGNOSIS — Z79899 Other long term (current) drug therapy: Secondary | ICD-10-CM | POA: Insufficient documentation

## 2021-04-29 DIAGNOSIS — J449 Chronic obstructive pulmonary disease, unspecified: Secondary | ICD-10-CM | POA: Insufficient documentation

## 2021-04-29 DIAGNOSIS — X501XXA Overexertion from prolonged static or awkward postures, initial encounter: Secondary | ICD-10-CM | POA: Diagnosis not present

## 2021-04-29 DIAGNOSIS — Y9389 Activity, other specified: Secondary | ICD-10-CM | POA: Diagnosis not present

## 2021-04-29 DIAGNOSIS — Z87891 Personal history of nicotine dependence: Secondary | ICD-10-CM | POA: Diagnosis not present

## 2021-04-29 DIAGNOSIS — Z96651 Presence of right artificial knee joint: Secondary | ICD-10-CM | POA: Diagnosis not present

## 2021-04-29 DIAGNOSIS — I10 Essential (primary) hypertension: Secondary | ICD-10-CM | POA: Diagnosis not present

## 2021-04-29 MED ORDER — HYDROMORPHONE HCL 1 MG/ML IJ SOLN
1.0000 mg | Freq: Once | INTRAMUSCULAR | Status: DC
  Filled 2021-04-29: qty 1

## 2021-04-29 MED ORDER — METHYLPREDNISOLONE 4 MG PO TBPK: ORAL_TABLET | ORAL | 0 refills | Status: DC

## 2021-04-29 MED ORDER — HYDROMORPHONE HCL 1 MG/ML IJ SOLN
1.0000 mg | Freq: Once | INTRAMUSCULAR | Status: AC
  Administered 2021-04-29: 1 mg via INTRAMUSCULAR

## 2021-04-29 MED ORDER — OXYCODONE-ACETAMINOPHEN 5-325 MG PO TABS: 1.0000 | ORAL_TABLET | ORAL | 0 refills | Status: DC | PRN

## 2021-04-29 MED ORDER — HYDROMORPHONE HCL 1 MG/ML IJ SOLN
1.0000 mg | Freq: Once | INTRAMUSCULAR | Status: AC
  Administered 2021-04-29: 1 mg via INTRAMUSCULAR
  Filled 2021-04-29: qty 1

## 2021-04-29 MED ORDER — CYCLOBENZAPRINE HCL 10 MG PO TABS: 10.0000 mg | ORAL_TABLET | Freq: Three times a day (TID) | ORAL | 0 refills | Status: DC | PRN

## 2021-04-29 NOTE — Discharge Instructions (Signed)
Follow-up with your regular doctor as needed.  Follow-up with orthopedics.  Ask for open MRI.  Take medication as prescribed.  Use ice instead of heat on your back

## 2021-04-29 NOTE — ED Notes (Signed)
See triage note  presents with mid back pain  states he reached out while moving a mattress yesterday  felt a pop to mid back  having increased pain with inspiration and movement

## 2021-04-29 NOTE — ED Triage Notes (Signed)
Pt to ED for lower back pain, states has herniated disc. States worsening pain started yesterday after pushing mattress.  Pt appears uncomfortable. In w/c  Pt with lidocaine patches on

## 2021-04-29 NOTE — ED Provider Notes (Signed)
[facility] Emergency Department Provider Note  ____________________________________________   Event Date/Time   First MD Initiated Contact with Patient 04/29/21 [phone]     (approximate)  I have reviewed the triage vital signs and the nursing notes.   HISTORY  Chief Complaint Back Pain    HPI Tom [name] is a 55 y.o. male presents emergency department complaint of mid back pain.  Patient states he was moving a mattress and felt a very loud pop in his mid back.  This had severe pain since then.  States he has a history of degenerative disc disease of the lumbar spine but has never had pain in the upper spine like today.  No fever or chills.  No numbness or tingling  Past Medical History:  Diagnosis Date   Arthritis    COPD (chronic obstructive pulmonary disease) (HCC)    Hyperlipidemia    Hypertension    Osteoporosis     Patient Active Problem List   Diagnosis Date Noted   DOE (dyspnea on exertion) 04/13/2021   Centrilobular emphysema (HCC) 01/27/2021   DDD (degenerative disc disease), lumbar 12/16/2020   Chronic back pain 12/16/2020   Arthritis of right ankle 05/08/2017   Atherosclerosis of native coronary artery of native heart without angina pectoris 01/06/2015   Essential hypertension 10/03/2012   Osteoarthritis 12/06/2011    Past Surgical History:  Procedure Laterality Date   ANKLE FUSION Right 2017   BACK SURGERY     TOTAL KNEE ARTHROPLASTY Right 04/2012    Prior to Admission medications   Medication Sig Start Date End Date Taking? Authorizing Provider  cyclobenzaprine (FLEXERIL) 10 MG tablet Take 1 tablet (10 mg total) by mouth 3 (three) times daily as needed. 04/29/21  Yes Dayra Rapley, Roselyn Bering, PA-C  methylPREDNISolone (MEDROL DOSEPAK) 4 MG TBPK tablet Take 6 pills on day one then decrease by 1 pill each day 04/29/21  Yes Jeweliana Dudgeon, Roselyn Bering, PA-C  oxyCODONE-acetaminophen (PERCOCET) 5-325 MG tablet Take 1 tablet by mouth every 4  (four) hours as needed for severe pain. 04/29/21 04/29/22 Yes Franceska Strahm, Roselyn Bering, PA-C  albuterol (VENTOLIN HFA) 108 (90 Base) MCG/ACT inhaler Inhale 1-2 puffs into the lungs every 4 (four) hours as needed for wheezing or shortness of breath. 01/27/21   [name], [name], DO  atorvastatin (LIPITOR) 40 MG tablet Take 1 tablet (40 mg total) by mouth daily. 01/27/21   [name], [name], DO  budesonide-formoterol (SYMBICORT) 80-4.5 MCG/ACT inhaler Take 2 puffs first thing in am and then another 2 puffs about 12 hours later. 04/13/21   Nyoka Cowden, MD  chlorthalidone (HYGROTON) 25 MG tablet Take 1 tablet (25 mg total) by mouth daily. 01/27/21 01/27/22  [name], DO  famotidine (PEPCID) 20 MG tablet One after supper 04/13/21   Nyoka Cowden, MD  losartan (COZAAR) 25 MG tablet Take 1 tablet (25 mg total) by mouth daily. 01/27/21   [name], [name], DO  Multiple Vitamin (MULTIVITAMIN) capsule Take 1 capsule by mouth daily.    [provider]  pantoprazole (PROTONIX) 40 MG tablet Take 1 tablet (40 mg total) by mouth daily. Take 30-60 min before first meal of the day 04/13/21   Nyoka Cowden, MD  potassium chloride SA (KLOR-CON) 20 MEQ tablet Take 1 tablet (20 mEq total) by mouth daily. 01/27/21   [name], [name], DO    Allergies Amlodipine, Bee venom, Cymbalta [duloxetine hcl], and Lisinopril  No family history on file.  Social History Social  History   Tobacco Use   Smoking status: Former    Types: Cigarettes    Quit date: 2017    Years since quitting: 5.8   Smokeless tobacco: Never  Vaping Use   Vaping Use: Never used  Substance Use Topics   Alcohol use: No    Review of Systems  Constitutional: Did not fever/chills Eyes: No visual changes. ENT: Denies sore throat. Respiratory: Denies cough Cardiovascular: Denies chest pain Gastrointestinal: Denies abdominal pain Genitourinary: Negative for dysuria. Musculoskeletal: Positive for  back pain. Skin: Negative for rash. Psychiatric: no mood changes,     ____________________________________________   PHYSICAL EXAM:  VITAL SIGNS: ED Triage Vitals  Enc Vitals Group     BP 04/29/21 0851 (!) 151/93     Pulse Rate 04/29/21 0851 89     Resp 04/29/21 0851 20     Temp 04/29/21 0851 98 F (36.7 C)     Temp Source 04/29/21 0851 Oral     SpO2 04/29/21 0851 95 %     Weight 04/29/21 0847 268 lb 15.4 oz (122 kg)     Height 04/29/21 0847 6' (1.829 m)     Head Circumference --      Peak Flow --      Pain Score 04/29/21 0847 10     Pain Loc --      Pain Edu? --      Excl. in GC? --     Constitutional: Alert and oriented. Well appearing and in no acute distress. Eyes: Conjunctivae are normal.  Head: Atraumatic. Nose: No congestion/rhinnorhea. Mouth/Throat: Mucous membranes are moist.   Neck:  supple no lymphadenopathy noted Cardiovascular: Normal rate, regular rhythm. Heart sounds are normal Respiratory: Normal respiratory effort.  No retractions, lungs c t a  GU: deferred Musculoskeletal: FROM all extremities, warm and well perfused, T-spine is extremely tender from the midportion to the lower portion, neurovascular is intact Neurologic:  Normal speech and language.  Skin:  Skin is warm, dry and intact. No rash noted. Psychiatric: Mood and affect are normal. Speech and behavior are normal.  ____________________________________________   LABS (all labs ordered are listed, but only abnormal results are displayed)  Labs Reviewed - No data to display ____________________________________________   ____________________________________________  RADIOLOGY  X-ray of the T-spine  ____________________________________________   PROCEDURES  Procedure(s) performed: Dilaudid 1 mg IM Procedures    ____________________________________________   INITIAL IMPRESSION / ASSESSMENT AND PLAN / ED COURSE  Pertinent labs & imaging results that were available during  my care of the patient were reviewed by me and considered in my medical decision making (see chart for details).   The patient is a 55 year old male presents emergency department with cute onset of mid back pain from an injury.  See HPI.  Physical exam shows patient to appear stable  X-ray of the T-spine to rule out compression fracture Patient was given Dilaudid 1 mg IM  X-ray of the T-spine reviewed by me confirmed by radiology to be negative for compression fracture or acute abnormality.  Does show degenerative changes.  Patient had relief with the Dilaudid.  Pain was returning prior to discharge so we did repeat the Dilaudid and gave him a prescription for steroids, Flexeril, and pain medication.  He is to follow-up with his regular doctor as needed.  Follow-up with orthopedics as he may need an MRI.  Patient has great difficulty with MRIs and I suggested he ask for an open MRI.  He is in agreement treatment plan.  Discharged in stable condition in the care of her sister.  Tom [name] was evaluated in Emergency Department on 04/29/2021 for the symptoms described in the history of present illness. He was evaluated in the context of the global COVID-19 pandemic, which necessitated consideration that the patient might be at risk for infection with the SARS-CoV-2 virus that causes COVID-19. Institutional protocols and algorithms that pertain to the evaluation of patients at risk for COVID-19 are in a state of rapid change based on information released by regulatory bodies including the CDC and federal and state organizations. These policies and algorithms were followed during the patient's care in the ED.    As part of my medical decision making, I reviewed the following data within the electronic MEDICAL RECORD NUMBER Nursing notes reviewed and incorporated, Old chart reviewed, Radiograph reviewed , Notes from prior ED visits, and [place] Controlled Substance  Database  ____________________________________________   FINAL CLINICAL IMPRESSION(S) / ED DIAGNOSES  Final diagnoses:  Acute midline thoracic back pain      NEW MEDICATIONS STARTED DURING THIS VISIT:  Discharge Medication List as of 04/29/2021 10:24 AM     START taking these medications   Details  cyclobenzaprine (FLEXERIL) 10 MG tablet Take 1 tablet (10 mg total) by mouth 3 (three) times daily as needed., Starting Thu 04/29/2021, Normal    methylPREDNISolone (MEDROL DOSEPAK) 4 MG TBPK tablet Take 6 pills on day one then decrease by 1 pill each day, Normal    oxyCODONE-acetaminophen (PERCOCET) 5-325 MG tablet Take 1 tablet by mouth every 4 (four) hours as needed for severe pain., Starting Thu 04/29/2021, Until Fri 04/29/2022 at 2359, Normal         Note:  This document was prepared using Dragon voice recognition software and may include unintentional dictation errors.    Faythe Ghee, PA-C 04/29/21 1203    [name], MD 04/29/21 1352

## 2021-04-30 ENCOUNTER — Telehealth: Payer: Self-pay

## 2021-04-30 ENCOUNTER — Ambulatory Visit: Payer: Medicaid Other | Admitting: Family Medicine

## 2021-04-30 NOTE — Telephone Encounter (Signed)
Transition Care Management Unsuccessful Follow-up Telephone Call  Date of discharge and from where:  04/29/2021-[facility]  Attempts:  1st Attempt  Reason for unsuccessful TCM follow-up call:  Unable to leave message

## 2021-05-02 ENCOUNTER — Emergency Department
Admission: EM | Admit: 2021-05-02 | Discharge: 2021-05-02 | Disposition: A | Discharge status: Home / Self Care | Payer: Medicaid Other | Attending: Emergency Medicine | Admitting: Emergency Medicine

## 2021-05-02 ENCOUNTER — Other Ambulatory Visit: Payer: Self-pay

## 2021-05-02 DIAGNOSIS — Z87891 Personal history of nicotine dependence: Secondary | ICD-10-CM | POA: Insufficient documentation

## 2021-05-02 DIAGNOSIS — I10 Essential (primary) hypertension: Secondary | ICD-10-CM | POA: Diagnosis not present

## 2021-05-02 DIAGNOSIS — M546 Pain in thoracic spine: Secondary | ICD-10-CM | POA: Diagnosis not present

## 2021-05-02 DIAGNOSIS — J449 Chronic obstructive pulmonary disease, unspecified: Secondary | ICD-10-CM | POA: Insufficient documentation

## 2021-05-02 DIAGNOSIS — Z96651 Presence of right artificial knee joint: Secondary | ICD-10-CM | POA: Diagnosis not present

## 2021-05-02 DIAGNOSIS — Z79899 Other long term (current) drug therapy: Secondary | ICD-10-CM | POA: Insufficient documentation

## 2021-05-02 DIAGNOSIS — Z7951 Long term (current) use of inhaled steroids: Secondary | ICD-10-CM | POA: Diagnosis not present

## 2021-05-02 DIAGNOSIS — X501XXA Overexertion from prolonged static or awkward postures, initial encounter: Secondary | ICD-10-CM | POA: Diagnosis not present

## 2021-05-02 MED ORDER — METHOCARBAMOL 500 MG PO TABS
500.0000 mg | ORAL_TABLET | Freq: Once | ORAL | Status: AC
  Administered 2021-05-02: 500 mg via ORAL
  Filled 2021-05-02: qty 1

## 2021-05-02 MED ORDER — OXYCODONE HCL 5 MG PO TABS
5.0000 mg | ORAL_TABLET | Freq: Three times a day (TID) | ORAL | 0 refills | Status: DC | PRN
Start: 2021-05-02 — End: 2021-05-18

## 2021-05-02 MED ORDER — METHOCARBAMOL 500 MG PO TABS: 500.0000 mg | ORAL_TABLET | Freq: Four times a day (QID) | ORAL | 0 refills | Status: DC | PRN

## 2021-05-02 MED ORDER — KETOROLAC TROMETHAMINE 30 MG/ML IJ SOLN
30.0000 mg | Freq: Once | INTRAMUSCULAR | Status: AC
  Administered 2021-05-02: 30 mg via INTRAMUSCULAR
  Filled 2021-05-02: qty 1

## 2021-05-02 MED ORDER — OXYCODONE HCL 5 MG PO TABS
5.0000 mg | ORAL_TABLET | Freq: Once | ORAL | Status: AC
  Administered 2021-05-02: 5 mg via ORAL
  Filled 2021-05-02: qty 1

## 2021-05-02 MED ORDER — ACETAMINOPHEN 500 MG PO TABS
1000.0000 mg | ORAL_TABLET | Freq: Once | ORAL | Status: AC
  Administered 2021-05-02: 1000 mg via ORAL
  Filled 2021-05-02: qty 2

## 2021-05-02 MED ORDER — LIDOCAINE 5 % EX PTCH
1.0000 | MEDICATED_PATCH | Freq: Once | CUTANEOUS | Status: DC
  Administered 2021-05-02: 1 via TRANSDERMAL
  Filled 2021-05-02: qty 1

## 2021-05-02 NOTE — ED Provider Notes (Signed)
[facility] Emergency Department Provider Note ____________________________________________   Event Date/Time   First MD Initiated Contact with Patient 05/02/21 1101     (approximate)  I have reviewed the triage vital signs and the nursing notes.  HISTORY  Chief Complaint Back Pain   HPI Tom [name] is a 55 y.o. malewho presents to the ED for evaluation of back pain.   Chart review indicates obese patient was seen here for the same 3 days ago.  He had plain films of the T-spine without fracture and was discharged with steroids, muscle relaxers and Percocet.  Patient presents to the ED for evaluation of continued and poorly controlled thoracic back pain.  He reports a twisting injury when he was lifting something 5 or 6 days ago when he felt a popping sensation to his thoracic back.  Denies any falls or direct trauma.  This is the same pain that he was seen for 3 days ago, and did not have any additional injuries since that time.  Denies fevers, saddle anesthesias, urinary or stool retention.  Reports that the Flexeril just makes him sleepy, ibuprofen helps a little bit and he is compliant with his steroids.  Presents due to poorly controlled pain and concerned that he looks crazy in front of his grandchildren at home.  Past Medical History:  Diagnosis Date   Arthritis    COPD (chronic obstructive pulmonary disease) (HCC)    Hyperlipidemia    Hypertension    Osteoporosis     Patient Active Problem List   Diagnosis Date Noted   DOE (dyspnea on exertion) 04/13/2021   Centrilobular emphysema (HCC) 01/27/2021   DDD (degenerative disc disease), lumbar 12/16/2020   Chronic back pain 12/16/2020   Arthritis of right ankle 05/08/2017   Atherosclerosis of native coronary artery of native heart without angina pectoris 01/06/2015   Essential hypertension 10/03/2012   Osteoarthritis 12/06/2011    Past Surgical History:  Procedure Laterality  Date   ANKLE FUSION Right 2017   BACK SURGERY     TOTAL KNEE ARTHROPLASTY Right 04/2012    Prior to Admission medications   Medication Sig Start Date End Date Taking? Authorizing Provider  albuterol (VENTOLIN HFA) 108 (90 Base) MCG/ACT inhaler Inhale 1-2 puffs into the lungs every 4 (four) hours as needed for wheezing or shortness of breath. 01/27/21   [name], [name], DO  atorvastatin (LIPITOR) 40 MG tablet Take 1 tablet (40 mg total) by mouth daily. 01/27/21   [name], [name], DO  budesonide-formoterol (SYMBICORT) 80-4.5 MCG/ACT inhaler Take 2 puffs first thing in am and then another 2 puffs about 12 hours later. 04/13/21   Nyoka Cowden, MD  chlorthalidone (HYGROTON) 25 MG tablet Take 1 tablet (25 mg total) by mouth daily. 01/27/21 01/27/22  [name], [name], DO  cyclobenzaprine (FLEXERIL) 10 MG tablet Take 1 tablet (10 mg total) by mouth 3 (three) times daily as needed. 04/29/21   Faythe Ghee, PA-C  famotidine (PEPCID) 20 MG tablet One after supper 04/13/21   Nyoka Cowden, MD  losartan (COZAAR) 25 MG tablet Take 1 tablet (25 mg total) by mouth daily. 01/27/21   [name], [name], DO  methylPREDNISolone (MEDROL DOSEPAK) 4 MG TBPK tablet Take 6 pills on day one then decrease by 1 pill each day 04/29/21   Faythe Ghee, PA-C  Multiple Vitamin (MULTIVITAMIN) capsule Take 1 capsule by mouth daily.    [provider]  oxyCODONE-acetaminophen (PERCOCET) 5-325 MG tablet Take 1  tablet by mouth every 4 (four) hours as needed for severe pain. 04/29/21 04/29/22  Fisher, Roselyn Bering, PA-C  pantoprazole (PROTONIX) 40 MG tablet Take 1 tablet (40 mg total) by mouth daily. Take 30-60 min before first meal of the day 04/13/21   Nyoka Cowden, MD  potassium chloride SA (KLOR-CON) 20 MEQ tablet Take 1 tablet (20 mEq total) by mouth daily. 01/27/21   [name], [name], DO    Allergies Amlodipine, Bee venom, Cymbalta [duloxetine hcl], and Lisinopril  No  family history on file.  Social History Social History   Tobacco Use   Smoking status: Former    Types: Cigarettes    Quit date: 2017    Years since quitting: 5.8   Smokeless tobacco: Never  Vaping Use   Vaping Use: Never used  Substance Use Topics   Alcohol use: No    Review of Systems  Constitutional: No fever/chills Eyes: No visual changes. ENT: No sore throat. Cardiovascular: Denies chest pain. Respiratory: Denies shortness of breath. Gastrointestinal: No abdominal pain.  No nausea, no vomiting.  No diarrhea.  No constipation. Genitourinary: Negative for dysuria. Musculoskeletal: Positive for thoracic back pain. Skin: Negative for rash. Neurological: Negative for headaches, focal weakness or numbness.  ____________________________________________   PHYSICAL EXAM:  VITAL SIGNS: Vitals:   05/02/21 0947  BP: 130/87  Pulse: (!) 106  Resp: 18  Temp: 98.4 F (36.9 C)  SpO2: 95%     Constitutional: Alert and oriented.  Appears uncomfortable, but in no acute distress.  Ambulatory. Eyes: Conjunctivae are normal. PERRL. EOMI. Head: Atraumatic. Nose: No congestion/rhinnorhea. Mouth/Throat: Mucous membranes are moist.  Oropharynx non-erythematous. Neck: No stridor. No cervical spine tenderness to palpation. Cardiovascular: Normal rate, regular rhythm. Grossly normal heart sounds.  Good peripheral circulation. Respiratory: Normal respiratory effort.  No retractions. Lungs CTAB. Gastrointestinal: Soft , nondistended, nontender to palpation. No CVA tenderness. Musculoskeletal: No lower extremity tenderness nor edema.  No joint effusions. No signs of acute trauma. Neurologic:  Normal speech and language. No gross focal neurologic deficits are appreciated.  Cranial nerves II through XII intact 5/5 strength and sensation in all 4 extremities Skin:  Skin is warm, dry and intact. No rash noted. Psychiatric: Mood and affect are normal. Speech and behavior are  normal.  ____________________________________________   LABS (all labs ordered are listed, but only abnormal results are displayed)  Labs Reviewed - No data to display ____________________________________________  12 Lead EKG   ____________________________________________  RADIOLOGY  ED MD interpretation: I reviewed T-spine x-rays from a couple days ago without evidence of fracture or dislocation.  Official radiology report(s): No results found.  ____________________________________________   PROCEDURES and INTERVENTIONS  Procedure(s) performed (including Critical Care):  Procedures  Medications - No data to display  ____________________________________________   MDM / ED COURSE   55 year old male presents to the ED with poorly controlled thoracic pain, possibly due to a slipped disc, but without evidence of significant neurologic or vascular deficits and amenable to outpatient management with improved multimodal analgesia.  No evidence of trauma to necessitate additional imaging.  No neurologic or vascular deficits to necessitate emergent MRI, furthermore he has significant anxiety and says he needs an open MRI.  We discussed nonnarcotic multimodal analgesia, supplemented with a couple tablets of as needed oxycodone at home.  We provide this to him in the ED and his pain is well controlled, he is ambulatory and I see no barriers to outpatient management.  We will refer him to neurosurgery and I  discussed return precautions with him prior to discharge.  Clinical Course as of 05/02/21 1303  Sun May 02, 2021  1104 Patient ambulates independently to the desk asking "do you know much longer is going to be??"  Requesting discharge.  Has not been seen yet. [DS]  1236 Reassessed.  Patient reports feeling better.  We again discussed multimodal management at home and following up with neurosurgery.  Return precautions for the ED discussed. [DS]  1239 Patient ambulatory to the  restroom independently.  Steady gait. [DS]    Clinical Course User Index [DS] Delton Prairie, MD    ____________________________________________   FINAL CLINICAL IMPRESSION(S) / ED DIAGNOSES  Final diagnoses:  None     ED Discharge Orders     None        Anwar Crill   Note:  This document was prepared using Dragon voice recognition software and may include unintentional dictation errors.    Delton Prairie, MD 05/02/21 [phone]

## 2021-05-02 NOTE — ED Triage Notes (Signed)
Pt c/o mid back pain , states he has a hx of back issues but this is worse  states they are suppose to be referring him to pain management, was seen here 3 days ago with the same sx

## 2021-05-02 NOTE — Discharge Instructions (Addendum)
Use Tylenol for pain and fevers.  Up to 1000 mg per dose, up to 4 times per day.  Do not take more than 4000 mg of Tylenol/acetaminophen within 24 hours..  Use naproxen/Aleve for anti-inflammatory pain relief. Use up to 500mg  every 12 hours. Do not take more frequently than this. Do not use other NSAIDs (ibuprofen, Advil) while taking this medication. It is safe to take Tylenol with this.   Please use lidocaine patches and your site of pain.  Apply 1 patch at a time, leave on for 12 hours, then remove for 12 hours.  12 hours on, 12 hours off.  Do not apply more than 1 patch at a time.  Use Robaxin muscle relaxer 3-4 times per day.  Use Oxycodone, only as needed, for more severe breakthrough pain.   Keep moving.  If you state laid up in bed all the time, your pain will get worse.  Throw away your Flexeril prescription.  Do not mix Percocet and oxycodone  I would reach out to the local neurosurgeon, Dr. , to be seen in the clinic.  They may be able to help get an MRI sooner than your PCP.

## 2021-05-03 ENCOUNTER — Telehealth: Payer: Self-pay

## 2021-05-03 NOTE — Telephone Encounter (Signed)
Transition Care Management Unsuccessful Follow-up Telephone Call  Date of discharge and from where:  05/02/2021 from [facility]  Attempts:  1st Attempt  Reason for unsuccessful TCM follow-up call:  Unable to leave message

## 2021-05-03 NOTE — Telephone Encounter (Signed)
Transition Care Management Unsuccessful Follow-up Telephone Call  Date of discharge and from where:  04/29/2021 from [facility]  Attempts:  2nd Attempt  Reason for unsuccessful TCM follow-up call:  Unable to leave message

## 2021-05-04 ENCOUNTER — Ambulatory Visit (INDEPENDENT_AMBULATORY_CARE_PROVIDER_SITE_OTHER): Payer: Medicaid Other | Admitting: Family Medicine

## 2021-05-04 ENCOUNTER — Other Ambulatory Visit: Payer: Self-pay

## 2021-05-04 ENCOUNTER — Encounter: Payer: Self-pay | Admitting: Family Medicine

## 2021-05-04 VITALS — BP 139/78 | HR 98 | Ht 72.0 in | Wt 270.0 lb

## 2021-05-04 DIAGNOSIS — M545 Low back pain, unspecified: Secondary | ICD-10-CM | POA: Diagnosis not present

## 2021-05-04 DIAGNOSIS — M51369 Other intervertebral disc degeneration, lumbar region without mention of lumbar back pain or lower extremity pain: Secondary | ICD-10-CM

## 2021-05-04 DIAGNOSIS — M546 Pain in thoracic spine: Secondary | ICD-10-CM | POA: Diagnosis not present

## 2021-05-04 DIAGNOSIS — M5136 Other intervertebral disc degeneration, lumbar region: Secondary | ICD-10-CM

## 2021-05-04 DIAGNOSIS — M5125 Other intervertebral disc displacement, thoracolumbar region: Secondary | ICD-10-CM

## 2021-05-04 DIAGNOSIS — G8929 Other chronic pain: Secondary | ICD-10-CM

## 2021-05-04 NOTE — Telephone Encounter (Signed)
Transition Care Management Unsuccessful Follow-up Telephone Call  Date of discharge and from where:  04/29/2021 from [facility]  Attempts:  3rd Attempt  Reason for unsuccessful TCM follow-up call:  Unable to reach patient

## 2021-05-04 NOTE — Patient Instructions (Addendum)
Thank you for coming to the office today.    Please schedule a Follow-up Appointment to: Return in about 5 months (around 10/02/2021) for 5 month follow-up Breathing / Back Pain.  If you have any other questions or concerns, please feel free to call the office or send a message through MyChart. You may also schedule an earlier appointment if necessary.  Additionally, you may be receiving a survey about your experience at our office within a few days to 1 week by e-mail or mail. We value your feedback.  Saralyn Pilar, DO [facility], [place]

## 2021-05-04 NOTE — Telephone Encounter (Signed)
Transition Care Management Unsuccessful Follow-up Telephone Call  Date of discharge and from where:  05/02/2021 from [facility]  Attempts:  2nd Attempt  Reason for unsuccessful TCM follow-up call:  Unable to leave message

## 2021-05-04 NOTE — Progress Notes (Signed)
Subjective:    Patient ID: Tom [name], male    DOB: 1966/06/03, 55 y.o.   MRN: [id number]  Tom [name] is a 55 y.o. male presenting on 05/04/2021 for COPD and Hypertension   HPI  ED FOLLOW-UP VISIT  Hospital/Location: [facility] Date of ED Visit: 04/29/21 Repeat ED Visit 05/02/21 Transitions of Care call yesterday on 05/03/21 by Junita Push CMA  Reason for Presenting to ED: Acute Mid Back Pain  FOLLOW-UP  - ED provider note and record have been reviewed - Patient presents today about 2 days after last most recent ED visit. Brief summary of recent course, patient had symptoms of acute severe mid back pain following reaching injury while helping move a mattress on 11/8 however worsening symptoms, he felt a "pop" in mid back causing severe pain, and he presented to ED on 11/10, X-ray Thoracic spine, treated with Flexeril, Steroid dose pak, Oxycodone PRN.  Returned to the [facility] ED on 11/13, no new injury  or new problem, he was still having poorly controlled pain, first rx Flexeril made him too sleepy, he was switched to Methocarbamol 500mg  q 6 hr PRN with some relief. He was also given Oxycodone 5mg  PRN #10 pills.  He has had lots of chronic injuries. Also given Lidocaine patch, and Toradol inj.  He was referred to [facility] Neurosurgery and also Neurology. They are ordering him an MRI for Lumbar spine. But he was too anxious in the previous one that he was in and had to stop it, they are trying to re-schedule.  - Today reports overall has done fairly well after discharge from ED. Symptoms of back pain have persists.  He has enough medication. Not asking for more opiates at this time. He has methocarbamol and ibuprofen PRN  I have reviewed the discharge medication list, and have reconciled the current and discharge medications today.    Current Outpatient Medications:    albuterol (VENTOLIN HFA) 108 (90 Base) MCG/ACT inhaler, Inhale 1-2 puffs into the lungs  every 4 (four) hours as needed for wheezing or shortness of breath., Disp: 6.7 g, Rfl: 3   atorvastatin (LIPITOR) 40 MG tablet, Take 1 tablet (40 mg total) by mouth daily., Disp: 90 tablet, Rfl: 3   budesonide-formoterol (SYMBICORT) 80-4.5 MCG/ACT inhaler, Take 2 puffs first thing in am and then another 2 puffs about 12 hours later., Disp: 1 each, Rfl: 12   chlorthalidone (HYGROTON) 25 MG tablet, Take 1 tablet (25 mg total) by mouth daily., Disp: 90 tablet, Rfl: 3   cyclobenzaprine (FLEXERIL) 10 MG tablet, Take 1 tablet (10 mg total) by mouth 3 (three) times daily as needed., Disp: 30 tablet, Rfl: 0   famotidine (PEPCID) 20 MG tablet, One after supper, Disp: 30 tablet, Rfl: 11   losartan (COZAAR) 25 MG tablet, Take 1 tablet (25 mg total) by mouth daily., Disp: 90 tablet, Rfl: 3   methocarbamol (ROBAXIN) 500 MG tablet, Take 1 tablet (500 mg total) by mouth every 6 (six) hours as needed for muscle spasms., Disp: 20 tablet, Rfl: 0   methylPREDNISolone (MEDROL DOSEPAK) 4 MG TBPK tablet, Take 6 pills on day one then decrease by 1 pill each day, Disp: 21 tablet, Rfl: 0   Multiple Vitamin (MULTIVITAMIN) capsule, Take 1 capsule by mouth daily., Disp: , Rfl:    oxyCODONE (ROXICODONE) 5 MG immediate release tablet, Take 1 tablet (5 mg total) by mouth every 8 (eight) hours as needed., Disp: 10 tablet, Rfl: 0   oxyCODONE-acetaminophen (PERCOCET) 5-325 MG  tablet, Take 1 tablet by mouth every 4 (four) hours as needed for severe pain., Disp: 20 tablet, Rfl: 0   pantoprazole (PROTONIX) 40 MG tablet, Take 1 tablet (40 mg total) by mouth daily. Take 30-60 min before first meal of the day, Disp: 30 tablet, Rfl: 2   potassium chloride SA (KLOR-CON) 20 MEQ tablet, Take 1 tablet (20 mEq total) by mouth daily., Disp: 90 tablet, Rfl: 3  ------------------------------------------------------------------------- Social History   Tobacco Use   Smoking status: Former    Types: Cigarettes    Quit date: 2017    Years since  quitting: 5.8   Smokeless tobacco: Never  Vaping Use   Vaping Use: Never used  Substance Use Topics   Alcohol use: No    Review of Systems Per HPI unless specifically indicated above     Objective:    BP 139/78   Pulse 98   Ht 6' (1.829 m)   Wt 270 lb (122.5 kg)   SpO2 97%   BMI 36.62 kg/m   Wt Readings from Last 3 Encounters:  05/04/21 270 lb (122.5 kg)  05/02/21 260 lb (117.9 kg)  04/29/21 268 lb 15.4 oz (122 kg)    Physical Exam Vitals and nursing note reviewed.  Constitutional:      General: He is not in acute distress.    Appearance: He is well-developed. He is not diaphoretic.     Comments: Well-appearing, comfortable, cooperative  HENT:     Head: Normocephalic and atraumatic.  Eyes:     General:        Right eye: No discharge.        Left eye: No discharge.     Conjunctiva/sclera: Conjunctivae normal.  Neck:     Thyroid: No thyromegaly.  Cardiovascular:     Rate and Rhythm: Normal rate and regular rhythm.     Pulses: Normal pulses.     Heart sounds: Normal heart sounds. No murmur heard. Pulmonary:     Effort: Pulmonary effort is normal. No respiratory distress.     Breath sounds: Normal breath sounds. No wheezing or rales.  Musculoskeletal:        General: Normal range of motion.     Cervical back: Normal range of motion and neck supple.  Lymphadenopathy:     Cervical: No cervical adenopathy.  Skin:    General: Skin is warm and dry.     Findings: No erythema or rash.  Neurological:     Mental Status: He is alert and oriented to person, place, and time. Mental status is at baseline.  Psychiatric:        Behavior: Behavior normal.     Comments: Well groomed, good eye contact, normal speech and thoughts    DG Thoracic Spine 2 ViewPerformed 04/29/2021 Final result  Study Result CLINICAL DATA: Back pain  EXAM: THORACIC SPINE 2 VIEWS  COMPARISON: None.  FINDINGS: No recent fracture is seen. Alignment of posterior margins of vertebral bodies  is unremarkable. Degenerative changes are noted with bony spurs in the thoracic spine, more so in the lower thoracic region. Degenerative changes are also noted in the visualized lower cervical and upper lumbar spine. There are numerous possible calcified nodules in both lungs.  IMPRESSION: No recent fracture is seen in the thoracic spine. Degenerative changes are noted in the thoracic spine and visualized portions of lower cervical spine and upper lumbar spine.   Electronically Signed By: Ernie Avena M.D. On: 04/29/2021 09:33     Results for orders  placed or performed during the hospital encounter of 04/13/21  D-dimer, quantitative  Result Value Ref Range   D-Dimer, Quant 1.13 (H) 0.00 - 0.50 ug/mL-FEU  TSH  Result Value Ref Range   TSH 1.780 0.350 - 4.500 uIU/mL  IgE  Result Value Ref Range   IgE (Immunoglobulin E), Serum 68 6 - 495 IU/mL  CBC with Differential/Platelet  Result Value Ref Range   WBC 9.7 4.0 - 10.5 K/uL   RBC 4.25 4.22 - 5.81 MIL/uL   Hemoglobin 14.3 13.0 - 17.0 g/dL   HCT 25.6 38.9 - 37.3 %   MCV 97.6 80.0 - 100.0 fL   MCH 33.6 26.0 - 34.0 pg   MCHC 34.5 30.0 - 36.0 g/dL   RDW 42.8 76.8 - 11.5 %   Platelets 219 150 - 400 K/uL   nRBC 0.0 0.0 - 0.2 %   Neutrophils Relative % 56 %   Neutro Abs 5.4 1.7 - 7.7 K/uL   Lymphocytes Relative 24 %   Lymphs Abs 2.3 0.7 - 4.0 K/uL   Monocytes Relative 15 %   Monocytes Absolute 1.5 (H) 0.1 - 1.0 K/uL   Eosinophils Relative 4 %   Eosinophils Absolute 0.4 0.0 - 0.5 K/uL   Basophils Relative 1 %   Basophils Absolute 0.1 0.0 - 0.1 K/uL   Immature Granulocytes 0 %   Abs Immature Granulocytes 0.02 0.00 - 0.07 K/uL  Brain natriuretic peptide  Result Value Ref Range   B Natriuretic Peptide 46.2 0.0 - 100.0 pg/mL  Basic metabolic panel  Result Value Ref Range   Sodium 139 135 - 145 mmol/L   Potassium 3.8 3.5 - 5.1 mmol/L   Chloride 104 98 - 111 mmol/L   CO2 26 22 - 32 mmol/L   Glucose, Bld 108 (H) 70 -  99 mg/dL   BUN 22 (H) 6 - 20 mg/dL   Creatinine, Ser 7.26 0.61 - 1.24 mg/dL   Calcium 8.8 (L) 8.9 - 10.3 mg/dL   GFR, Estimated >20 >35 mL/min   Anion gap 9 5 - 15  Alpha-1-Antitrypsin Phenotyp  Result Value Ref Range   A-1 Antitrypsin Pheno MM    A-1 Antitrypsin, Ser 219 (H) 101 - 187 mg/dL      Assessment & Plan:   Problem List Items Addressed This Visit     DDD (degenerative disc disease), lumbar   Chronic back pain   Other Visit Diagnoses     Acute midline thoracic back pain    -  Primary   Herniation of thoracolumbar intervertebral disc          ED Follow-up x2 Mid back pain, suggestive of herniated / slip disc Acute on chronic back pain Known OA/DJD DDD other areas of spine Has had X-ray, and anticipated upcoming MRI per Neurosurgery/neurology On meds from ED improved, taking rare opioid but still using it PRN Has Methocarbamol (failed flexeril sedation) On NSAID PRN Completing steroid taper Today no new rx needed, he declines refills. Discussed that he can pursue further management from his current specialist if they were to consider procedures or interventional management for pain or medication management he may need other provider / pain management.  COPD Stable without flare.   No orders of the defined types were placed in this encounter.   Follow up plan: Return in about 5 months (around 10/02/2021) for 5 month follow-up Breathing / Back Pain.   Saralyn Pilar, DO [facility] 05/04/2021, 2:30 PM

## 2021-05-05 NOTE — Telephone Encounter (Signed)
Transition Care Management Follow-up Telephone Call Date of discharge and from where: 05/02/2021 from [facility] How have you been since you were released from the hospital? Pt stated that he is in a lot of pain. Pt did not have any questions about the next steps to help with his symptoms.  Any questions or concerns? No  Items Reviewed: Did the pt receive and understand the discharge instructions provided? Yes  Medications obtained and verified? Yes  Other? No  Any new allergies since your discharge? No  Dietary orders reviewed? No Do you have support at home? Yes   Functional Questionnaire: (I = Independent and D = Dependent) ADLs: I  Bathing/Dressing- I  Meal Prep- I  Eating- I  Maintaining continence- I  Transferring/Ambulation- I  Managing Meds- I  Follow up appointments reviewed:  PCP Hospital f/u appt confirmed? No  PCP visit 05/04/2021 Specialist Hospital f/u appt confirmed? No  Are transportation arrangements needed? No  If their condition worsens, is the pt aware to call PCP or go to the Emergency Dept.? Yes Was the patient provided with contact information for the PCP's office or ED? Yes Was to pt encouraged to call back with questions or concerns? Yes

## 2021-05-10 ENCOUNTER — Ambulatory Visit: Payer: Self-pay | Admitting: *Deleted

## 2021-05-10 NOTE — Telephone Encounter (Signed)
Reason for Disposition  [1] SEVERE back pain (e.g., excruciating, unable to do any normal activities) AND [2] not improved 2 hours after pain medicine  Answer Assessment - Initial Assessment Questions 1. ONSET: "When did the pain begin?"      Pt calling in c/o back pain.   He has bad pain.   There's no way I can get to the emergency room.    It feels like someone is sticking me in my back.   I'm going to [facility] for an MRI of my back I think Dec. 15th.    I called them and they told me to call my PCP.    2. LOCATION: "Where does it hurt?" (upper, mid or lower back)     Right in the middle of my spine.   I have a herniated disc or a ruptured disc.   I've taken 800 mg ibuprofen this morning.    3. SEVERITY: "How bad is the pain?"  (e.g., Scale 1-10; mild, moderate, or severe)   - MILD (1-3): doesn't interfere with normal activities    - MODERATE (4-7): interferes with normal activities or awakens from sleep    - SEVERE (8-10): excruciating pain, unable to do any normal activities      10 It's severe 4. PATTERN: "Is the pain constant?" (e.g., yes, no; constant, intermittent)      Constant It never goes away.   It started 2 weeks ago and has continued since constantly.    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    5. RADIATION: "Does the pain shoot into your legs or elsewhere?"     No radiation except to my lower back which I have problems there too.   I have 4 ruptured in my lower back. 6. CAUSE:  "What do you think is causing the back pain?"      I have ruptured or herniated disc in my back. 7. 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 8. MEDICATIONS: "What have you taken so far for the pain?" (e.g., nothing, acetaminophen, NSAIDS)     Ibuprofen 800 mg.   I'm using ice on it.   Lidocaine patches but they are not helping either. 9. NEUROLOGIC SYMPTOMS: "Do you have any weakness, numbness, or problems with bowel/bladder  control?"     No weakness/numbness down either leg.   It makes me lean over like an old [place]. 10. OTHER SYMPTOMS: "Do you have any other symptoms?" (e.g., fever, abdominal pain, burning with urination, blood in urine)       Sometimes it makes it hard to pee.   I think that's a different problem.  11. PREGNANCY: "Is there any chance you are pregnant?" (e.g., yes, no; LMP)       N/A  Protocols used: Back Pain-A-AH

## 2021-05-10 NOTE — Telephone Encounter (Signed)
His back problem is more complicated than I can manage. He needs spine specialist. He was seen in ED twice in 3 days.  Last visit he said that he had upcoming MRI, and I would presume a doctors visit from the Neurosurgery/Spine specialist. However I do not see that apt.  He will need to follow-up as scheduled for MRI and for consultation with spine specialist.  Otherwise, we would be looking at referral to Pain Management if needed.  Saralyn Pilar, DO [facility] 05/10/2021, 11:06 AM

## 2021-05-10 NOTE — Telephone Encounter (Signed)
Pt called in c/o severe middle back pain that started about 2 weeks ago and has been constant.   He is for an MRI Dec. 15th on his back.   He called his back doctor this morning and they told him to call his PCP.    See triage notes.  I made him an appt at his request on 05/18/2021 at 2:20 with Dr. Althea Charon.   Protocol indicated to be seen within 4 hrs however he said,   "I've been dealing with this this long I'll be alright until then".   (Per chart he has been to the ED twice for this recently and has seen Dr. Althea Charon on 05/04/2021).

## 2021-05-18 ENCOUNTER — Other Ambulatory Visit: Payer: Self-pay

## 2021-05-18 ENCOUNTER — Encounter: Payer: Self-pay | Admitting: Family Medicine

## 2021-05-18 ENCOUNTER — Ambulatory Visit: Payer: Medicaid Other | Admitting: Family Medicine

## 2021-05-18 VITALS — BP 152/82 | HR 97 | Ht 72.0 in | Wt 276.0 lb

## 2021-05-18 DIAGNOSIS — M546 Pain in thoracic spine: Secondary | ICD-10-CM | POA: Diagnosis not present

## 2021-05-18 DIAGNOSIS — M545 Low back pain, unspecified: Secondary | ICD-10-CM

## 2021-05-18 DIAGNOSIS — M5136 Other intervertebral disc degeneration, lumbar region: Secondary | ICD-10-CM | POA: Diagnosis not present

## 2021-05-18 DIAGNOSIS — M51369 Other intervertebral disc degeneration, lumbar region without mention of lumbar back pain or lower extremity pain: Secondary | ICD-10-CM

## 2021-05-18 DIAGNOSIS — G8929 Other chronic pain: Secondary | ICD-10-CM | POA: Diagnosis not present

## 2021-05-18 DIAGNOSIS — M5125 Other intervertebral disc displacement, thoracolumbar region: Secondary | ICD-10-CM | POA: Diagnosis not present

## 2021-05-18 MED ORDER — TIZANIDINE HCL 4 MG PO TABS: 4.0000 mg | ORAL_TABLET | Freq: Three times a day (TID) | ORAL | 2 refills | Status: DC | PRN

## 2021-05-18 MED ORDER — MELOXICAM 15 MG PO TABS: 15.0000 mg | ORAL_TABLET | Freq: Every day | ORAL | 2 refills | Status: DC | PRN

## 2021-05-18 MED ORDER — OXYCODONE HCL 5 MG PO TABS: 5.0000 mg | ORAL_TABLET | Freq: Four times a day (QID) | ORAL | 0 refills | Status: AC | PRN

## 2021-05-18 NOTE — Progress Notes (Signed)
Subjective:    Patient ID: Tom [name], male    DOB: 03-23-1966, 55 y.o.   MRN: [id number]  Tom [name] is a 55 y.o. male presenting on 05/18/2021 for Back Pain   HPI  Acute Thoracic Back Pain Chronic Bilateral Low Back Pain DDD Spine (Thoracic and Lumbar) Herniation disc, thoracolumbar  Last visit with me 05/04/21, ED follow-up for same issue back pain. ED on 05/02/21. He has upcoming [facility] apt 06/03/21 He is anticipating MRI on his back as well at that apt He has history on opiate therapy but not on long term management with opiates lately, not followed by pain management. He has tried and failed multiple regular therapy options including NSAIDs, Prednisone dosepak, muscle relaxants flexeril, methocarbamol, among other meds. He was given short term course oxycodone PRN from ED with some relief. He is interested in pain relief until can be seen by Neurosurgery as a bridge only, he is interested in MRI and further management and surgical intervention if available   Right Ankle Swelling S/p ankle fusion from previous Admits eating pizza with anchovies recently that had high sodium caused swelling. He has long standing history of episodic swelling R ankle due to surgical procedure. He said smoking in past made it swell more, now not smoking  Depression screen [facility] 2/9 12/16/2020  Decreased Interest 0  Down, Depressed, Hopeless 0  PHQ - 2 Score 0  Altered sleeping 0  Tired, decreased energy 0  Change in appetite 0  Feeling bad or failure about yourself  0  Trouble concentrating 0  Moving slowly or fidgety/restless 0  Suicidal thoughts 0  PHQ-9 Score 0  Difficult doing work/chores Not difficult at all    Social History   Tobacco Use   Smoking status: Former    Types: Cigarettes    Quit date: 2017    Years since quitting: 5.9   Smokeless tobacco: Never  Vaping Use   Vaping Use: Never used  Substance Use Topics   Alcohol use: No     Review of Systems Per HPI unless specifically indicated above     Objective:    BP (!) 152/82   Pulse 97   Ht 6' (1.829 m)   Wt 276 lb (125.2 kg)   SpO2 96%   BMI 37.43 kg/m   Wt Readings from Last 3 Encounters:  05/18/21 276 lb (125.2 kg)  05/04/21 270 lb (122.5 kg)  05/02/21 260 lb (117.9 kg)    Physical Exam Vitals and nursing note reviewed.  Constitutional:      General: He is not in acute distress.    Appearance: He is well-developed. He is not diaphoretic.     Comments: Well-appearing, uncomfortable with back pain, cooperative  HENT:     Head: Normocephalic and atraumatic.  Eyes:     General:        Right eye: No discharge.        Left eye: No discharge.     Conjunctiva/sclera: Conjunctivae normal.  Neck:     Thyroid: No thyromegaly.  Cardiovascular:     Rate and Rhythm: Normal rate and regular rhythm.     Pulses: Normal pulses.     Heart sounds: Normal heart sounds. No murmur heard. Pulmonary:     Effort: Pulmonary effort is normal. No respiratory distress.     Breath sounds: Normal breath sounds. No wheezing or rales.  Musculoskeletal:        General: Normal range of motion.  Cervical back: Normal range of motion and neck supple.  Lymphadenopathy:     Cervical: No cervical adenopathy.  Skin:    General: Skin is warm and dry.     Findings: No erythema or rash.  Neurological:     Mental Status: He is alert and oriented to person, place, and time. Mental status is at baseline.  Psychiatric:        Behavior: Behavior normal.     Comments: Well groomed, good eye contact, normal speech and thoughts     Results for orders placed or performed during the hospital encounter of 04/13/21  D-dimer, quantitative  Result Value Ref Range   D-Dimer, Quant 1.13 (H) 0.00 - 0.50 ug/mL-FEU  TSH  Result Value Ref Range   TSH 1.780 0.350 - 4.500 uIU/mL  IgE  Result Value Ref Range   IgE (Immunoglobulin E), Serum 68 6 - 495 IU/mL  CBC with  Differential/Platelet  Result Value Ref Range   WBC 9.7 4.0 - 10.5 K/uL   RBC 4.25 4.22 - 5.81 MIL/uL   Hemoglobin 14.3 13.0 - 17.0 g/dL   HCT 94.8 54.6 - 27.0 %   MCV 97.6 80.0 - 100.0 fL   MCH 33.6 26.0 - 34.0 pg   MCHC 34.5 30.0 - 36.0 g/dL   RDW 35.0 09.3 - 81.8 %   Platelets 219 150 - 400 K/uL   nRBC 0.0 0.0 - 0.2 %   Neutrophils Relative % 56 %   Neutro Abs 5.4 1.7 - 7.7 K/uL   Lymphocytes Relative 24 %   Lymphs Abs 2.3 0.7 - 4.0 K/uL   Monocytes Relative 15 %   Monocytes Absolute 1.5 (H) 0.1 - 1.0 K/uL   Eosinophils Relative 4 %   Eosinophils Absolute 0.4 0.0 - 0.5 K/uL   Basophils Relative 1 %   Basophils Absolute 0.1 0.0 - 0.1 K/uL   Immature Granulocytes 0 %   Abs Immature Granulocytes 0.02 0.00 - 0.07 K/uL  Brain natriuretic peptide  Result Value Ref Range   B Natriuretic Peptide 46.2 0.0 - 100.0 pg/mL  Basic metabolic panel  Result Value Ref Range   Sodium 139 135 - 145 mmol/L   Potassium 3.8 3.5 - 5.1 mmol/L   Chloride 104 98 - 111 mmol/L   CO2 26 22 - 32 mmol/L   Glucose, Bld 108 (H) 70 - 99 mg/dL   BUN 22 (H) 6 - 20 mg/dL   Creatinine, Ser 2.99 0.61 - 1.24 mg/dL   Calcium 8.8 (L) 8.9 - 10.3 mg/dL   GFR, Estimated >37 >16 mL/min   Anion gap 9 5 - 15  Alpha-1-Antitrypsin Phenotyp  Result Value Ref Range   A-1 Antitrypsin Pheno MM    A-1 Antitrypsin, Ser 219 (H) 101 - 187 mg/dL      Assessment & Plan:   Problem List Items Addressed This Visit     DDD (degenerative disc disease), lumbar   Relevant Medications   oxyCODONE (ROXICODONE) 5 MG immediate release tablet   meloxicam (MOBIC) 15 MG tablet   tiZANidine (ZANAFLEX) 4 MG tablet   Chronic back pain   Relevant Medications   oxyCODONE (ROXICODONE) 5 MG immediate release tablet   meloxicam (MOBIC) 15 MG tablet   tiZANidine (ZANAFLEX) 4 MG tablet   Other Visit Diagnoses     Acute midline thoracic back pain    -  Primary   Relevant Medications   oxyCODONE (ROXICODONE) 5 MG immediate release  tablet   meloxicam (MOBIC) 15 MG  tablet   tiZANidine (ZANAFLEX) 4 MG tablet   Herniation of thoracolumbar intervertebral disc       Relevant Medications   oxyCODONE (ROXICODONE) 5 MG immediate release tablet       Mid back pain, suggestive of herniated / slip disc Acute on chronic back pain Known OA/DJD DDD other areas of spine Has had X-ray, and anticipated upcoming MRI per Neurosurgery  Has new patient initial consult scheduled [facility]/03/21 at [facility]  Today advised I can help bridge him to that apt with medication.  We discussed that I cannot offer long term opiate pain management. This is short term only to get to specialist. If he needs further medication we would have to refer him to pain management.  Re order oxycodone IR 5mg  q 6 hr PRN amount for 14 day course, use PRN  Stop Methocarbamol switch to Tizanidine PRN  Order Meloxicam 15mg  daily PRN NSAID   Meds ordered this encounter  Medications   oxyCODONE (ROXICODONE) 5 MG immediate release tablet    Sig: Take 1 tablet (5 mg total) by mouth every 6 (six) hours as needed for up to 14 days for severe pain or moderate pain.    Dispense:  56 tablet    Refill:  0   meloxicam (MOBIC) 15 MG tablet    Sig: Take 1 tablet (15 mg total) by mouth daily as needed for pain.    Dispense:  30 tablet    Refill:  2   tiZANidine (ZANAFLEX) 4 MG tablet    Sig: Take 1 tablet (4 mg total) by mouth every 8 (eight) hours as needed for muscle spasms.    Dispense:  60 tablet    Refill:  2      Follow up plan: Return if symptoms worsen or fail to improve.  , DO [facility] 05/18/2021, 2:18 PM

## 2021-05-18 NOTE — Patient Instructions (Addendum)
Thank you for coming to the office today.  Oxycodone IR 5mg  every 6 hours as needed #56 pills for up to 14 days or 2 weeks.  Recommend to start taking Tylenol Extra Strength 500mg  tabs - take 1 to 2 tabs per dose (max 1000mg ) every 6-8 hours for pain (take regularly, don't skip a dose for next 7 days), max 24 hour daily dose is 6 tablets or 3000mg . In the future you can repeat the same everyday Tylenol course for 1-2 weeks at a time.   Tizanidine muscle relaxant, take  needed, STOP taking Flexeril, Methocarbamol  Meloxicam 15mg  daily as needed. Avoid Ibuprofen, Aleve, Naproxen.  Keep neurosurgery apt 06/03/21  In future if we need we can refer to Pain Management.  Use RICE therapy: - R - Rest / relative rest with activity modification avoid overuse of joint - I - Ice packs (make sure you use a towel or sock / something to protect skin) - C - Compression with ACE wrap to apply pressure and reduce swelling allowing more support - E - Elevation - if significant swelling, lift leg above heart level (toes above your nose) to help reduce swelling, most helpful at night after day of being on your feet  Limit salt sodium.  Please schedule a Follow-up Appointment to: Return if symptoms worsen or fail to improve.  If you have any other questions or concerns, please feel free to call the office or send a message through MyChart. You may also schedule an earlier appointment if necessary.  Additionally, you may be receiving a survey about your experience at our office within a few days to 1 week by e-mail or mail. We value your feedback.  , DO [facility], 

## 2021-05-19 ENCOUNTER — Telehealth: Payer: Self-pay

## 2021-05-19 NOTE — Telephone Encounter (Signed)
Copied from CRM [id number]. Topic: General - Other >> May 19, 2021 10:26 AM Maye Hides wrote:  Reason for CRM: Pt states that pharmacy is needing a PA for his oxyCODONE (ROXICODONE) 5 MG immediate release tablet.[facility] [address] [place] - [address]  [address] [place], [place] [address]  Phone:  [phone] Fax:  [phone]

## 2021-05-20 NOTE — Telephone Encounter (Signed)
Denied.

## 2021-05-20 NOTE — Telephone Encounter (Signed)
PA is in process... It has not been denied at this point.

## 2021-05-25 ENCOUNTER — Ambulatory Visit: Payer: Medicaid Other | Admitting: Internal Medicine

## 2021-05-25 ENCOUNTER — Encounter: Payer: Self-pay | Admitting: Internal Medicine

## 2021-05-25 ENCOUNTER — Other Ambulatory Visit: Payer: Self-pay

## 2021-05-25 VITALS — BP 130/80 | HR 89 | Temp 98.0°F | Ht 72.0 in | Wt 284.2 lb

## 2021-05-25 DIAGNOSIS — R0602 Shortness of breath: Secondary | ICD-10-CM

## 2021-05-25 DIAGNOSIS — R0609 Other forms of dyspnea: Secondary | ICD-10-CM

## 2021-05-25 MED ORDER — PREDNISONE 10 MG PO TABS: ORAL_TABLET | ORAL | 0 refills | Status: DC

## 2021-05-25 MED ORDER — ALBUTEROL SULFATE (2.5 MG/3ML) 0.083% IN NEBU: 2.5000 mg | INHALATION_SOLUTION | RESPIRATORY_TRACT | 12 refills | Status: DC | PRN

## 2021-05-25 NOTE — Patient Instructions (Addendum)
Plan A = Automatic = Always=   Symbicort 160 Take 2 puffs first thing in am and then another 2 puffs about 12 hours later.   Prednisone 10 mg take  4 each am x 2 days,   2 each am x 2 days,  1 each am x 2 days and stop   Plan B = Backup (to supplement plan A, not to replace it) Only use your albuterol inhaler as a rescue medication to be used if you can't catch your breath by resting or doing a relaxed purse lip breathing pattern.  - The less you use it, the better it will work when you need it. - Ok to use the inhaler up to 2 puffs  every 4 hours if you must but call for appointment if use goes up over your usual need - Don't leave home without it !!  (think of it like starter fluid or  spare tire for your car)   Plan C = Crisis (instead of Plan B but only if Plan B stops working) - only use your albuterol nebulizer if you first try Plan B and it fails to help > ok to use the nebulizer up to every 4 hours but if start needing it regularly call for immediate appointment  Ok to try albuterol 15 min before an activity (on alternating days)  that you know would usually make you short of breath and see if it makes any difference and if makes none then don't take albuterol after activity unless you can't catch your breath as this means it's the resting that helps, not the albuterol.       Plan D = Doctor - call me if B and C not adequate  Plan E = ER - go to ER or call 911 if all else fails     Please schedule a follow up visit in 3 months but call sooner if needed with PFT in meantime

## 2021-05-25 NOTE — Progress Notes (Signed)
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   Work on inhaler technique  Prednisone 10 mg take  4 each am x 2 days,   2 each am x 2 days,  1 each am x 2 days and stop  Pantoprazole (protonix) 40 mg   Take  30-60 min before first meal of the day and Pepcid (famotidine)  20 mg after supper until return to office - this is the best way to tell whether stomach acid is contributing to your problem.   Plan B = Backup (to supplement plan A, not to replace it) Only use your albuterol inhaler as a rescue medication  GERD diet reviewed, bed blocks rec    05/25/2021  f/u ov/Tom [name]/ [facility] re: copd / AB    maint on symbicort/protonix/  Chief Complaint  Patient presents with   Follow-up    DOE-  Dyspnea:  20 -30 min around a grocery store slow pace, can't do hills  Cough: some better  Sleeping: bed blocks/ but evensleeping  on pillows wakes up 3-4 h p lie down/ has to use nebulizer since ran out of symbicort160 and didn't know he could refill it  SABA use: 2-3 x per day / neb bid avg 02: no  Covid status:  vax x 2    No obvious day to day  or daytime variability or assoc excess/ purulent sputum or mucus plugs or hemoptysis or cp or chest tightness, subjective wheeze or overt sinus or hb symptoms.     Also denies any obvious fluctuation of symptoms with weather or environmental changes or other aggravating or alleviating factors except as outlined above   No unusual exposure hx or h/o childhood pna/ asthma or knowledge of premature birth.  Current Allergies, Complete Past Medical History, Past Surgical History, Family History, and Social History were reviewed in [employer] record.  ROS  The following are not active complaints unless bolded Hoarseness, sore throat, dysphagia, dental problems, itching, sneezing,  nasal congestion or discharge of excess mucus or purulent secretions, ear ache,   fever, chills, sweats, unintended wt loss or wt gain, classically pleuritic or exertional cp,  orthopnea pnd or arm/hand swelling  or leg swelling R > L, presyncope, palpitations, abdominal pain, anorexia, nausea, vomiting, diarrhea  or change in bowel habits or change in bladder habits, change in stools or change in urine, dysuria, hematuria,  rash, arthralgias,  visual complaints, headache, numbness, weakness or ataxia or problems with walking or coordination,  change in mood or  memory.        Current Meds  Medication Sig   albuterol (VENTOLIN HFA) 108 (90 Base) MCG/ACT inhaler Inhale 1-2 puffs into the lungs every 4 (four) hours as needed for wheezing or shortness of breath.   atorvastatin (LIPITOR) 40 MG tablet Take 1 tablet (40 mg total) by mouth daily.   budesonide-formoterol (SYMBICORT) 80-4.5 MCG/ACT inhaler Take 2 puffs first thing in am and then another 2 puffs about 12 hours later.   chlorthalidone (HYGROTON) 25 MG tablet Take 1 tablet (25 mg total) by mouth daily.   famotidine (PEPCID) 20 MG tablet One after supper   losartan (COZAAR) 25 MG tablet Take 1 tablet (25 mg total) by mouth daily.   meloxicam (MOBIC)  15 MG tablet Take 1 tablet (15 mg total) by mouth daily as needed for pain.   Multiple Vitamin (MULTIVITAMIN) capsule Take 1 capsule by mouth daily.   oxyCODONE (ROXICODONE) 5 MG immediate release tablet Take 1 tablet (5 mg total) by mouth every 6 (six) hours as needed for up to 14 days for severe pain or moderate pain.   pantoprazole (PROTONIX) 40 MG tablet Take 1 tablet (40 mg total) by mouth daily. Take 30-60 min before first meal of the day   potassium chloride SA (KLOR-CON) 20 MEQ tablet Take 1 tablet (20 mEq total) by mouth daily.   tiZANidine (ZANAFLEX) 4 MG tablet Take 1 tablet (4 mg total) by mouth every 8 (eight) hours as needed for muscle spasms.             Past Medical History:  Diagnosis Date   Arthritis    COPD (chronic obstructive pulmonary disease) (HCC)    Hyperlipidemia    Hypertension    Osteoporosis        Objective:    Wt Readings from Last 3 Encounters:  05/25/21 284 lb 3.2 oz (128.9 kg)  05/18/21 276 lb (125.2 kg)  05/04/21 270 lb (122.5 kg)      Vital signs reviewed  05/25/2021  - Note at rest 02 sats  97% on RA   General appearance:    amb wm /   slt rattling cough  HEENT : pt wearing mask not removed for exam due to covid - 19 concerns.   NECK :  without JVD/Nodes/TM/ nl carotid upstrokes bilaterally   LUNGS: no acc muscle use,  Min barrel  contour chest wall with bilateral  ins[/exp rhonchi  and  without cough on insp or exp maneuvers and min  Hyperresonant  to  percussion bilaterally     CV:  RRR  no s3 or murmur or increase in P2, and 2+ ptting R/ trace on L LE pitting edema   ABD: obese  soft and nontender with pos end  insp Hoover's  in the supine position. No bruits or organomegaly appreciated, bowel sounds nl  MS:   Nl gait/  ext warm without deformities, calf tenderness, cyanosis or clubbing No obvious joint restrictions   SKIN: warm and dry without lesions    NEURO:  alert, approp, nl sensorium with  no motor or cerebellar  deficits apparent.                  Assessment

## 2021-05-25 NOTE — Assessment & Plan Note (Addendum)
Quit smoking 2017  - 04/13/2021  After extensive coaching inhaler device,  effectiveness =    75% from a baseline of nearly 0 > try symbicort 80 2bid  - 10/25/522  D dimer 1.13 >>>  04/14/21  Venous dopplers neg bilaterally  - 04/13/21  Eos 0.5  IgE  68 and alpha one phenotype  MM   Level 219  - 05/25/2021  After extensive coaching inhaler device,  effectiveness =    80% from baseline 50% (short ti)   Was doing fine on symbicort 160 2bid but did not understand "always" meant to refill it when it ran out (has one year supply in pharmacy)   Re SABA :  I spent extra time with pt today reviewing appropriate use of albuterol for prn use on exertion with the following points: 1) saba is for relief of sob that does not improve by walking a slower pace or resting but rather if the pt does not improve after trying this first. 2) If the pt is convinced, as many are, that saba helps recover from activity faster then it's easy to tell if this is the case by re-challenging : ie stop, take the inhaler, then p 5 minutes try the exact same activity (intensity of workload) that just caused the symptoms and see if they are substantially diminished or not after saba 3) if there is an activity that reproducibly causes the symptoms, try the saba 15 min before the activity on alternate days   If in fact the saba really does help, then fine to continue to use it prn but advised may need to look closer at the maintenance regimen being used to achieve better control of airways disease with exertion.   Will repeat the pred x 6 days as this was very effective p last ov   Cough better on gerd rx > continue  F/u with pfts           Each maintenance medication was reviewed in detail including emphasizing most importantly the difference between maintenance and prns and under what circumstances the prns are to be triggered using an action plan format where appropriate.  Total time for H and P, chart review, counseling,  reviewing hfa device(s) and generating customized AVS unique to this office visit / same day charting =  30 min

## 2021-05-26 NOTE — Telephone Encounter (Signed)
Patient called in checking on status of PA for oxyCODONE (ROXICODONE) 5 MG . I told him of message on dec 1, still being worked on.

## 2021-05-27 NOTE — Telephone Encounter (Signed)
Denied.

## 2021-06-29 DIAGNOSIS — M5136 Other intervertebral disc degeneration, lumbar region: Secondary | ICD-10-CM | POA: Diagnosis not present

## 2021-06-29 DIAGNOSIS — M47817 Spondylosis without myelopathy or radiculopathy, lumbosacral region: Secondary | ICD-10-CM | POA: Diagnosis not present

## 2021-07-05 ENCOUNTER — Telehealth: Payer: Self-pay

## 2021-07-05 NOTE — Telephone Encounter (Signed)
ATC patient in regards to his upcoming COVID test, patients VM is currently not set up. Will try again later and will route this to triage.

## 2021-07-05 NOTE — Telephone Encounter (Signed)
Pt aware of of upcoming Covid test nothing further needed

## 2021-07-07 ENCOUNTER — Other Ambulatory Visit: Payer: Self-pay

## 2021-07-07 ENCOUNTER — Other Ambulatory Visit
Admission: RE | Admit: 2021-07-07 | Discharge: 2021-07-07 | Disposition: A | Discharge status: Home / Self Care | Payer: Medicaid Other | Source: Ambulatory Visit | Attending: Internal Medicine | Admitting: Internal Medicine

## 2021-07-07 DIAGNOSIS — Z01812 Encounter for preprocedural laboratory examination: Secondary | ICD-10-CM | POA: Diagnosis not present

## 2021-07-07 DIAGNOSIS — Z01818 Encounter for other preprocedural examination: Secondary | ICD-10-CM

## 2021-07-07 DIAGNOSIS — Z20822 Contact with and (suspected) exposure to covid-19: Secondary | ICD-10-CM | POA: Diagnosis not present

## 2021-07-07 LAB — SARS CORONAVIRUS 2 (TAT 6-24 HRS): SARS Coronavirus 2: NEGATIVE

## 2021-07-08 ENCOUNTER — Ambulatory Visit: Payer: Medicaid Other | Attending: Internal Medicine

## 2021-07-08 ENCOUNTER — Encounter: Payer: Self-pay | Admitting: Internal Medicine

## 2021-07-08 DIAGNOSIS — R0602 Shortness of breath: Secondary | ICD-10-CM | POA: Insufficient documentation

## 2021-07-08 LAB — PULMONARY FUNCTION TEST ARMC ONLY
DL/VA % pred: 104 %
DL/VA: 4.44 ml/min/mmHg/L
DLCO unc % pred: 99 %
DLCO unc: 29.77 ml/min/mmHg
FEF 25-75 Post: 2.34 L/sec
FEF 25-75 Pre: 1.6 L/sec
FEF2575-%Change-Post: 46 %
FEF2575-%Pred-Post: 70 %
FEF2575-%Pred-Pre: 48 %
FEV1-%Change-Post: 16 %
FEV1-%Pred-Post: 67 %
FEV1-%Pred-Pre: 58 %
FEV1-Post: 2.7 L
FEV1-Pre: 2.31 L
FEV1FVC-%Change-Post: 0 %
FEV1FVC-%Pred-Pre: 88 %
FEV6-%Change-Post: 18 %
FEV6-%Pred-Post: 78 %
FEV6-%Pred-Pre: 66 %
FEV6-Post: 3.94 L
FEV6-Pre: 3.33 L
FEV6FVC-%Change-Post: -1 %
FEV6FVC-%Pred-Post: 103 %
FEV6FVC-%Pred-Pre: 104 %
FVC-%Change-Post: 16 %
FVC-%Pred-Post: 76 %
FVC-%Pred-Pre: 65 %
FVC-Post: 3.98 L
FVC-Pre: 3.41 L
Post FEV1/FVC ratio: 68 %
Post FEV6/FVC ratio: 99 %
Pre FEV1/FVC ratio: 68 %
Pre FEV6/FVC Ratio: 100 %
RV % pred: 165 %
RV: 3.8 L
TLC % pred: 109 %
TLC: 8.07 L

## 2021-07-09 MED ORDER — BUDESONIDE-FORMOTEROL FUMARATE 80-4.5 MCG/ACT IN AERO: INHALATION_SPRAY | RESPIRATORY_TRACT | 12 refills | Status: DC

## 2021-07-09 NOTE — Telephone Encounter (Signed)
Sorry for the delay but it is C/w mild asthma rec symbicort 80 Take 2 puffs first thing in am and then another 2 puffs about 12 hours later Be sure patient has/keeps f/u ov so we can go over all the details of this study and get a plan together moving forward - ok to move up f/u if not feeling better and wants to be seen sooner

## 2021-07-09 NOTE — Telephone Encounter (Signed)
Called and spoke to patient. He has a better understanding of recommendation. I have scheduled patient for an appointment on 09/16/21 to review PFT results. Nothing further needed.

## 2021-07-09 NOTE — Telephone Encounter (Signed)
Dr. Melvyn Novas, please advise on PFT results.

## 2021-07-11 ENCOUNTER — Other Ambulatory Visit: Payer: Self-pay | Admitting: Family Medicine

## 2021-07-11 DIAGNOSIS — J432 Centrilobular emphysema: Secondary | ICD-10-CM

## 2021-07-11 NOTE — Telephone Encounter (Signed)
Requested Prescriptions  Pending Prescriptions Disp Refills   albuterol (VENTOLIN HFA) 108 (90 Base) MCG/ACT inhaler [Pharmacy Med Name: Albuterol Sulfate HFA 108 (90 Base) MCG/ACT Inhalation Aerosol Solution] 6.7 g 2    Sig: INHALE 1 TO 2 PUFFS INTO THE LUNGS EVERY 4 HOURS AS NEEDED FOR WHEEZING OR SHORTNESS OF BREATH     Pulmonology:  Beta Agonists Failed - 07/11/2021  4:18 PM      Failed - One inhaler should last at least one month. If the patient is requesting refills earlier, contact the patient to check for uncontrolled symptoms.      Passed - Valid encounter within last 12 months    Recent Outpatient Visits          1 month ago Acute midline thoracic back pain   [place], DO   2 months ago Acute midline thoracic back pain   [place], DO   3 months ago COPD with acute exacerbation [facility])   [place], DO   5 months ago Annual physical exam   [facility] Olin Hauser, DO   6 months ago Essential hypertension   [place], Devonne Doughty, DO      Future Appointments            Tomorrow [place], Devonne Doughty, DO [facility] [place]   In 2 months Melvyn Novas, Christena Deem, MD [place]

## 2021-07-12 ENCOUNTER — Ambulatory Visit: Payer: Medicaid Other | Admitting: Family Medicine

## 2021-07-14 ENCOUNTER — Ambulatory Visit: Payer: Medicaid Other | Admitting: Family Medicine

## 2021-07-14 ENCOUNTER — Encounter: Payer: Self-pay | Admitting: Family Medicine

## 2021-07-14 ENCOUNTER — Other Ambulatory Visit: Payer: Self-pay

## 2021-07-14 DIAGNOSIS — M545 Low back pain, unspecified: Secondary | ICD-10-CM

## 2021-07-14 DIAGNOSIS — G8929 Other chronic pain: Secondary | ICD-10-CM | POA: Diagnosis not present

## 2021-07-14 DIAGNOSIS — M5136 Other intervertebral disc degeneration, lumbar region: Secondary | ICD-10-CM

## 2021-07-14 MED ORDER — TIZANIDINE HCL 4 MG PO TABS: 4.0000 mg | ORAL_TABLET | Freq: Three times a day (TID) | ORAL | 3 refills | Status: DC | PRN

## 2021-07-14 MED ORDER — BUPROPION HCL ER (XL) 150 MG PO TB24: 150.0000 mg | ORAL_TABLET | Freq: Every morning | ORAL | 1 refills | Status: DC

## 2021-07-14 NOTE — Patient Instructions (Addendum)
Thank you for coming to the office today.  Start Wellbutrin XL 150mg  daily for up to 3-6 months to trial, with appetite suppression weight loss.  If plan to stop within 1st month, you can stop immediately. If you decide to stop if after a few months, you can taper down on it.  Refilled Tizanidine muscle relaxant.  DUE for FASTING BLOOD WORK (no food or drink after midnight before the lab appointment, only water or coffee without cream/sugar on the morning of)  SCHEDULE "Lab Only" visit in the morning at the clinic for lab draw in 6 MONTHS   - Make sure Lab Only appointment is at about 1 week before your next appointment, so that results will be available  For Lab Results, once available within 2-3 days of blood draw, you can can log in to MyChart online to view your results and a brief explanation. Also, we can discuss results at next follow-up visit.    Please schedule a Follow-up Appointment to: Return in about 6 months (around 01/11/2022) for 6 month Annual Physical in AM fasting lab AFTER.  If you have any other questions or concerns, please feel free to call the office or send a message through MyChart. You may also schedule an earlier appointment if necessary.  Additionally, you may be receiving a survey about your experience at our office within a few days to 1 week by e-mail or mail. We value your feedback.  01/13/2022, DO [facility], [facility]

## 2021-07-14 NOTE — Progress Notes (Signed)
Subjective:    Patient ID: Tom [name], male    DOB: August 25, 1965, 56 y.o.   MRN: [id number]  Tom [name] is a 56 y.o. male presenting on 07/14/2021 for No chief complaint on file.   HPI  Morbid Obesity BMI >37 Reports he has worked on lifestyle, increasing exercise and diet regimen, now avoiding red meat, and he is limiting carb intake, down 10 lbs in about 2 months. Reported that his brother recently started on Wellbutrin and he has done well for >30 days so far  Interested in trial on Wellbutrin now for his own weight loss   Chronic bilateral low back pain DDD Thoracic/Lumbar Spine History of disc herniation Recent history last treated by me 04/2021 for same issue, we discussed management with NSAID, Muscle relaxant Tizanidine PRN and rx Oxycodone temporary one time order, ans referral to Neurosurgery spine specialist. Neurosurgery has seen him already in January 2023 and he is doing well, was advised that likely had muscle strain spasm and has improved, back pain has improved, he did not pick up oxycodone due to cost / did not have insurance approval, and needs refill now on Tizanidine as needed.   Depression screen [facility] 2/9 12/16/2020  Decreased Interest 0  Down, Depressed, Hopeless 0  PHQ - 2 Score 0  Altered sleeping 0  Tired, decreased energy 0  Change in appetite 0  Feeling bad or failure about yourself  0  Trouble concentrating 0  Moving slowly or fidgety/restless 0  Suicidal thoughts 0  PHQ-9 Score 0  Difficult doing work/chores Not difficult at all    Social History   Tobacco Use   Smoking status: Former    Types: Cigarettes    Quit date: 2017    Years since quitting: 6.0   Smokeless tobacco: Never  Vaping Use   Vaping Use: Never used  Substance Use Topics   Alcohol use: No    Review of Systems Per HPI unless specifically indicated above     Objective:    BP (!) 142/77    Pulse 87    Ht 6' (1.829 m)    Wt 275 lb (124.7 kg)    SpO2  99%    BMI 37.30 kg/m   Wt Readings from Last 3 Encounters:  07/14/21 275 lb (124.7 kg)  05/25/21 284 lb 3.2 oz (128.9 kg)  05/18/21 276 lb (125.2 kg)    Physical Exam Vitals and nursing note reviewed.  Constitutional:      General: He is not in acute distress.    Appearance: Normal appearance. He is well-developed. He is obese. He is not diaphoretic.     Comments: Well-appearing, comfortable, cooperative  HENT:     Head: Normocephalic and atraumatic.  Eyes:     General:        Right eye: No discharge.        Left eye: No discharge.     Conjunctiva/sclera: Conjunctivae normal.  Cardiovascular:     Rate and Rhythm: Normal rate.  Pulmonary:     Effort: Pulmonary effort is normal.  Skin:    General: Skin is warm and dry.     Findings: No erythema or rash.  Neurological:     Mental Status: He is alert and oriented to person, place, and time.  Psychiatric:        Mood and Affect: Mood normal.        Behavior: Behavior normal.        Thought Content: Thought  content normal.     Comments: Well groomed, good eye contact, normal speech and thoughts   Results for orders placed or performed in visit on 07/08/21  Pulmonary Function Test [facility] Only  Result Value Ref Range   FVC-Pre 3.41 L   FVC-%Pred-Pre 65 %   FVC-Post 3.98 L   FVC-%Pred-Post 76 %   FVC-%Change-Post 16 %   FEV1-Pre 2.31 L   FEV1-%Pred-Pre 58 %   FEV1-Post 2.70 L   FEV1-%Pred-Post 67 %   FEV1-%Change-Post 16 %   FEV6-Pre 3.33 L   FEV6-%Pred-Pre 66 %   FEV6-Post 3.94 L   FEV6-%Pred-Post 78 %   FEV6-%Change-Post 18 %   Pre FEV1/FVC ratio 68 %   FEV1FVC-%Pred-Pre 88 %   Post FEV1/FVC ratio 68 %   FEV1FVC-%Change-Post 0 %   Pre FEV6/FVC Ratio 100 %   FEV6FVC-%Pred-Pre 104 %   Post FEV6/FVC ratio 99 %   FEV6FVC-%Pred-Post 103 %   FEV6FVC-%Change-Post -1 %   FEF 25-75 Pre 1.60 L/sec   FEF2575-%Pred-Pre 48 %   FEF 25-75 Post 2.34 L/sec   FEF2575-%Pred-Post 70 %   FEF2575-%Change-Post 46 %   RV 3.80 L    RV % pred 165 %   TLC 8.07 L   TLC % pred 109 %   DLCO unc 29.77 ml/min/mmHg   DLCO unc % pred 99 %   DL/VA 4.09 ml/min/mmHg/L   DL/VA % pred 735 %      Assessment & Plan:   Problem List Items Addressed This Visit     DDD (degenerative disc disease), lumbar   Relevant Medications   tiZANidine (ZANAFLEX) 4 MG tablet   Chronic back pain   Relevant Medications   tiZANidine (ZANAFLEX) 4 MG tablet   buPROPion (WELLBUTRIN XL) 150 MG 24 hr tablet   Other Visit Diagnoses     Morbid obesity (HCC)    -  Primary   Relevant Medications   buPROPion (WELLBUTRIN XL) 150 MG 24 hr tablet       Morbid Obesity BMI >37 with comorbid HTN, CAD, Osteoarthritis, HLD Encourage lifestyle modification continued wt loss plan Will add Wellbutrin XL 150mg  daily for appetite suppression to help augment his weight loss Reviewed dosing and benefits risk Future reconsider other med options such as GLP1 therapy if indicated.  Chronic Back Pain Lumbar DDD Followed now by [facility] Neurosurgery - last visit 06/2021 issue has resolved, no further intervention required. Agree to manage his MSK issues with refill continued Tizanidine muscle relaxant PRN  Meds ordered this encounter  Medications   tiZANidine (ZANAFLEX) 4 MG tablet    Sig: Take 1 tablet (4 mg total) by mouth every 8 (eight) hours as needed for muscle spasms.    Dispense:  60 tablet    Refill:  3   buPROPion (WELLBUTRIN XL) 150 MG 24 hr tablet    Sig: Take 1 tablet (150 mg total) by mouth in the morning.    Dispense:  90 tablet    Refill:  1      Follow up plan: Return in about 6 months (around 01/11/2022) for 6 month Annual Physical in AM fasting lab AFTER.  01/13/2022, DO [facility] 07/14/2021, 2:23 PM

## 2021-07-22 ENCOUNTER — Telehealth: Payer: Self-pay

## 2021-07-22 NOTE — Telephone Encounter (Signed)
Copied from CRM [id number]. Topic: General - Other >> Jul 21, 2021 11:20 AM Tom [name] A wrote: Reason for CRM: The patient shares that they're experiencing side effects from their buPROPion (WELLBUTRIN XL) 150 MG 24 hr tablet [[id number]]  The patient last took the medication this morning 07/21/21 and experienced nausea and vomiting from it   The patient says that they will not take anymore of the medication  The patient would like to speak with a member of clinical staff when possible before scheduling an additional appt   Please contact further

## 2021-07-22 NOTE — Telephone Encounter (Signed)
Wellbutrin was recently started about 1 week ago for appetite suppression for weight loss.  Here is the common side effect profile.  Endocrine & metabolic: Weight loss (14% to 58%)  Gastrointestinal: Constipation (8% to 26%), nausea (9% to 18%), nausea and vomiting (23%)  Unfortunately most medications that cause appetite suppression have some common effect of nausea, not always significant but it can be present.  He should be taking it with food and also may consider taking it for several days in a row to see if the side effects does improve with use.  If he does not want to take it further, unfortunately we are limited because the other weight loss medications are name brand, very costly, and may cause the exact same side effect.  Saralyn Pilar, DO [facility] 07/22/2021, 5:43 PM

## 2021-08-03 ENCOUNTER — Ambulatory Visit: Payer: Medicaid Other | Admitting: Family Medicine

## 2021-08-06 ENCOUNTER — Ambulatory Visit: Payer: Medicaid Other | Admitting: Family Medicine

## 2021-08-13 ENCOUNTER — Encounter: Payer: Self-pay | Admitting: Family Medicine

## 2021-08-13 ENCOUNTER — Other Ambulatory Visit: Payer: Self-pay

## 2021-08-13 ENCOUNTER — Ambulatory Visit (INDEPENDENT_AMBULATORY_CARE_PROVIDER_SITE_OTHER): Payer: Medicaid Other | Admitting: Family Medicine

## 2021-08-13 DIAGNOSIS — R7303 Prediabetes: Secondary | ICD-10-CM

## 2021-08-13 MED ORDER — METFORMIN HCL 500 MG PO TABS: 1000.0000 mg | ORAL_TABLET | Freq: Two times a day (BID) | ORAL | 3 refills | Status: DC

## 2021-08-13 NOTE — Patient Instructions (Addendum)
Thank you for coming to the office today.  Start Metformin 500mg  daily for now with supper/dinner, then after 1 week if tolerating can go up to 500mg  twice a day with meal. Then gradually go up to 2 pills a day with meal at max dose.  Call insurance find cost and coverage of the following - check the following: - Drug Tier, Preferred List, On Formulary - All will require a "Prior Authorization" from first, before you can find out the cost - Find out if there is "Step Therapy" (other medicines required before you can try these)  Once you pick the one you want to try, let me know - we can get a sample ready IF we have it in stock. Then try it - and before running out of medicine, contact me back to order your Rx so we have time to get it processed.  For Pre-Diabetes / Diabetes   1. Ozempic (Semaglutide injection) - start 0.25mg  weekly for 4 weeks then increase to 0.5mg  weekly, sample is 6 doses, re-use the same pen until empty, new needle each dose.   2. Trulicity (Dulaglutide) - once weekly 0.75 (likely we would start) and 1.5 max dose, sample is 2 doses, 1 dose per pen, each pen is a one time use, no visible needle, it is an auto-injector.  ----------  For Weight Loss / Obesity only  Wegovy (same as Ozempic) weekly injection - start 0.25mg  weekly, 1 dose per pen, single use, auto-injector  2. Saxenda - DAILY injection - start 0.6mg  injection DAILY, sample is 3 weeks, new needle each dose.   3 benefits - 1 significantly reduced A1c sugar, and may be able to reduce or stop metformin in future - 2 reduced appetite and weight loss with good results - 3 cardiovascular risk reduction, less likely to have heart attack/stroke    ---------------------------------------------  Please schedule a Follow-up Appointment to: Return if symptoms worsen or fail to improve.  If you have any other questions or concerns, please feel free to call the office or send a message through MyChart. You  may also schedule an earlier appointment if necessary.  Additionally, you may be receiving a survey about your experience at our office within a few days to 1 week by e-mail or mail. We value your feedback.  , DO [facility], Saralyn Pilar

## 2021-08-13 NOTE — Progress Notes (Signed)
Subjective:    Patient ID: Tom [name], male    DOB: Nov 18, 1965, 56 y.o.   MRN: [id number]  Tom [name] is a 56 y.o. male presenting on 08/13/2021 for Nausea and Emesis   HPI  Obesity BMI >37 PreDM Last lab A1c 5.7 Goal for appetite suppression. Last visit started Wellbutrin causing nausea vomiting. He is interested in other medicines. But he continues to work on lifestyle diet as well.   Depression screen [facility] 2/9 12/16/2020  Decreased Interest 0  Down, Depressed, Hopeless 0  PHQ - 2 Score 0  Altered sleeping 0  Tired, decreased energy 0  Change in appetite 0  Feeling bad or failure about yourself  0  Trouble concentrating 0  Moving slowly or fidgety/restless 0  Suicidal thoughts 0  PHQ-9 Score 0  Difficult doing work/chores Not difficult at all    Social History   Tobacco Use   Smoking status: Former    Types: Cigarettes    Quit date: 2017    Years since quitting: 6.1   Smokeless tobacco: Never  Vaping Use   Vaping Use: Never used  Substance Use Topics   Alcohol use: No    Review of Systems Per HPI unless specifically indicated above     Objective:    BP 119/74    Pulse 74    Ht 6' (1.829 m)    Wt 278 lb 3.2 oz (126.2 kg)    SpO2 98%    BMI 37.73 kg/m   Wt Readings from Last 3 Encounters:  08/13/21 278 lb 3.2 oz (126.2 kg)  07/14/21 275 lb (124.7 kg)  05/25/21 284 lb 3.2 oz (128.9 kg)    Physical Exam Vitals and nursing note reviewed.  Constitutional:      General: He is not in acute distress.    Appearance: Normal appearance. He is well-developed. He is obese. He is not diaphoretic.     Comments: Well-appearing, comfortable, cooperative  HENT:     Head: Normocephalic and atraumatic.  Eyes:     General:        Right eye: No discharge.        Left eye: No discharge.     Conjunctiva/sclera: Conjunctivae normal.  Cardiovascular:     Rate and Rhythm: Normal rate.  Pulmonary:     Effort: Pulmonary effort is normal.  Skin:     General: Skin is warm and dry.     Findings: No erythema or rash.  Neurological:     Mental Status: He is alert and oriented to person, place, and time.  Psychiatric:        Mood and Affect: Mood normal.        Behavior: Behavior normal.        Thought Content: Thought content normal.     Comments: Well groomed, good eye contact, normal speech and thoughts   Results for orders placed or performed in visit on 07/08/21  Pulmonary Function Test [facility] Only  Result Value Ref Range   FVC-Pre 3.41 L   FVC-%Pred-Pre 65 %   FVC-Post 3.98 L   FVC-%Pred-Post 76 %   FVC-%Change-Post 16 %   FEV1-Pre 2.31 L   FEV1-%Pred-Pre 58 %   FEV1-Post 2.70 L   FEV1-%Pred-Post 67 %   FEV1-%Change-Post 16 %   FEV6-Pre 3.33 L   FEV6-%Pred-Pre 66 %   FEV6-Post 3.94 L   FEV6-%Pred-Post 78 %   FEV6-%Change-Post 18 %   Pre FEV1/FVC ratio 68 %  FEV1FVC-%Pred-Pre 88 %   Post FEV1/FVC ratio 68 %   FEV1FVC-%Change-Post 0 %   Pre FEV6/FVC Ratio 100 %   FEV6FVC-%Pred-Pre 104 %   Post FEV6/FVC ratio 99 %   FEV6FVC-%Pred-Post 103 %   FEV6FVC-%Change-Post -1 %   FEF 25-75 Pre 1.60 L/sec   FEF2575-%Pred-Pre 48 %   FEF 25-75 Post 2.34 L/sec   FEF2575-%Pred-Post 70 %   FEF2575-%Change-Post 46 %   RV 3.80 L   RV % pred 165 %   TLC 8.07 L   TLC % pred 109 %   DLCO unc 29.77 ml/min/mmHg   DLCO unc % pred 99 %   DL/VA 2.37 ml/min/mmHg/L   DL/VA % pred 628 %      Assessment & Plan:   Problem List Items Addressed This Visit     Pre-diabetes   Relevant Medications   metFORMIN (GLUCOPHAGE) 500 MG tablet   Morbid obesity (HCC) - Primary   Relevant Medications   metFORMIN (GLUCOPHAGE) 500 MG tablet    For PreDM  Start Metformin 500mg  daily for now with supper/dinner, then after 1 week if tolerating can go up to 500mg  twice a day with meal. Then gradually go up to 2 pills a day with meal at max dose.  Goal for lifestyle modification and metformin to help with weight loss  Check GLP1 / Weight Loss  Medications per AVS for insurance coverage, however w/ Medicaid limited options.  Meds ordered this encounter  Medications   metFORMIN (GLUCOPHAGE) 500 MG tablet    Sig: Take 2 tablets (1,000 mg total) by mouth 2 (two) times daily with a meal.    Dispense:  360 tablet    Refill:  3      Follow up plan: Return if symptoms worsen or fail to improve.     , DO [facility] 08/13/2021, 2:44 PM

## 2021-08-24 ENCOUNTER — Other Ambulatory Visit: Payer: Self-pay | Admitting: Family Medicine

## 2021-08-24 ENCOUNTER — Encounter: Payer: Self-pay | Admitting: Family Medicine

## 2021-08-24 ENCOUNTER — Other Ambulatory Visit: Payer: Self-pay

## 2021-08-24 ENCOUNTER — Ambulatory Visit (INDEPENDENT_AMBULATORY_CARE_PROVIDER_SITE_OTHER): Payer: Medicaid Other | Admitting: Family Medicine

## 2021-08-24 VITALS — BP 144/82 | Ht 72.0 in | Wt 276.0 lb

## 2021-08-24 DIAGNOSIS — R350 Frequency of micturition: Secondary | ICD-10-CM

## 2021-08-24 DIAGNOSIS — R8290 Unspecified abnormal findings in urine: Secondary | ICD-10-CM

## 2021-08-24 DIAGNOSIS — R3911 Hesitancy of micturition: Secondary | ICD-10-CM | POA: Diagnosis not present

## 2021-08-24 DIAGNOSIS — N401 Enlarged prostate with lower urinary tract symptoms: Secondary | ICD-10-CM

## 2021-08-24 LAB — POCT URINALYSIS DIPSTICK
Bilirubin, UA: NEGATIVE
Glucose, UA: POSITIVE — AB
Ketones, UA: NEGATIVE
Leukocytes, UA: NEGATIVE
Nitrite, UA: NEGATIVE
Protein, UA: POSITIVE — AB
Spec Grav, UA: <=1.005 — AB (ref 1.010–1.025)
Urobilinogen, UA: 0.2 E.U./dL
pH, UA: 7 (ref 5.0–8.0)

## 2021-08-24 MED ORDER — TAMSULOSIN HCL 0.4 MG PO CAPS: 0.4000 mg | ORAL_CAPSULE | Freq: Every day | ORAL | 2 refills | Status: DC

## 2021-08-24 NOTE — Progress Notes (Signed)
? ?Subjective:  ? ? Patient ID: Tom [name], male    DOB: 07/14/1965, 56 y.o.   MRN: [id number] ? ?Tom [name] is a 56 y.o. male presenting on 08/24/2021 for Urinary Frequency and Back Pain ? ? ?HPI ? ?BPH LUTS Urinary Hesitancy / Urgency ? ?He has had severe issue with prostate and urinary retention urinary hesitancy in past 2+ months worsening. ?Difficulty voiding and emptying ?Interfering with his quality of life. ?Never on medication ? ?AUA BPH Symptom Score over past 1 month ?1. Sensation of not emptying bladder post void - 5 ?2. Urinate less than 2 hour after finish last void - 5 ?3. Start/Stop several times during void - 4 ?4. Difficult to postpone urination - 3 ?5. Weak urinary stream - 1 ?6. Push or strain urination - 4 ?7. Nocturia - +10 times ? ?Total Score: 30+ (Severe BPH symptoms) ? ? ? ?Depression screen [facility] 2/9 12/16/2020  ?Decreased Interest 0  ?Down, Depressed, Hopeless 0  ?PHQ - 2 Score 0  ?Altered sleeping 0  ?Tired, decreased energy 0  ?Change in appetite 0  ?Feeling bad or failure about yourself  0  ?Trouble concentrating 0  ?Moving slowly or fidgety/restless 0  ?Suicidal thoughts 0  ?PHQ-9 Score 0  ?Difficult doing work/chores Not difficult at all  ? ? ?Social History  ? ?Tobacco Use  ? Smoking status: Former  ?  Types: Cigarettes  ?  Quit date: 2017  ?  Years since quitting: 6.1  ? Smokeless tobacco: Never  ?Vaping Use  ? Vaping Use: Never used  ?Substance Use Topics  ? Alcohol use: No  ? ? ?Review of Systems ?Per HPI unless specifically indicated above ? ?   ?Objective:  ?  ?BP (!) 144/82   Ht 6' (1.829 m)   Wt 276 lb (125.2 kg)   BMI 37.43 kg/m?   ?Wt Readings from Last 3 Encounters:  ?08/24/21 276 lb (125.2 kg)  ?08/13/21 278 lb 3.2 oz (126.2 kg)  ?07/14/21 275 lb (124.7 kg)  ?  ?Physical Exam ?Vitals and nursing note reviewed.  ?Constitutional:   ?   General: He is not in acute distress. ?   Appearance: Normal appearance. He is well-developed. He is not diaphoretic.   ?   Comments: Well-appearing, comfortable, cooperative  ?HENT:  ?   Head: Normocephalic and atraumatic.  ?Eyes:  ?   General:     ?   Right eye: No discharge.     ?   Left eye: No discharge.  ?   Conjunctiva/sclera: Conjunctivae normal.  ?Cardiovascular:  ?   Rate and Rhythm: Normal rate.  ?Pulmonary:  ?   Effort: Pulmonary effort is normal.  ?Skin: ?   General: Skin is warm and dry.  ?   Findings: No erythema or rash.  ?Neurological:  ?   Mental Status: He is alert and oriented to person, place, and time.  ?Psychiatric:     ?   Mood and Affect: Mood normal.     ?   Behavior: Behavior normal.     ?   Thought Content: Thought content normal.  ?   Comments: Well groomed, good eye contact, normal speech and thoughts  ? ? ?Results for orders placed or performed in visit on 08/24/21  ?POCT Urinalysis Dipstick  ?Result Value Ref Range  ? Color, UA Yellow   ? Clarity, UA Clear   ? Glucose, UA Positive (A) Negative  ? Bilirubin, UA Negative   ? Ketones,  UA Negative   ? Spec Grav, UA <=1.005 (A) 1.010 - 1.025  ? Blood, UA Moderate ++   ? pH, UA 7.0 5.0 - 8.0  ? Protein, UA Positive (A) Negative  ? Urobilinogen, UA 0.2 0.2 or 1.0 E.U./dL  ? Nitrite, UA Negative   ? Leukocytes, UA Negative Negative  ? Appearance    ? Odor    ? ?   ?Assessment & Plan:  ? ?Problem List Items Addressed This Visit   ?None ?Visit Diagnoses   ? ? Benign prostatic hyperplasia with urinary hesitancy    -  Primary  ? Relevant Medications  ? tamsulosin (FLOMAX) 0.4 MG CAPS capsule  ? Urinary frequency      ? Relevant Orders  ? POCT Urinalysis Dipstick (Completed)  ? ?  ?  ?Consistent clinically with new diagnosis BPH, with lower urinary tract symptoms (LUTS) notable with obstructive symptoms as well ?He is adamantly against DRE exam and declines. ? ?- AUA BPH score 30+ severe ?Never on med before ?- Last PSA 1.17 (01/2021),  ?- No known personal/family history of prostate CA ? ?Plan: ?1. Start Tamsulosin 0.4mg  daily, advised on benefits, risks, if BP  low caution with sudden standing up or position change ?2. Urinalysis + Urine Culture - rule out UTI ?2. Follow-up if not improving we can consider dose increase tamsulosin, add 5 alpha reductase inhib finasteride, and or refer to Urology ? ? ? ?Meds ordered this encounter  ?Medications  ? tamsulosin (FLOMAX) 0.4 MG CAPS capsule  ?  Sig: Take 1 capsule (0.4 mg total) by mouth daily after breakfast.  ?  Dispense:  30 capsule  ?  Refill:  2  ? ? ? ?Follow up plan: ?Return if symptoms worsen or fail to improve. ? ?Saralyn Pilar, DO ?[facility] ?08/24/2021, 3:14 PM ?

## 2021-08-24 NOTE — Patient Instructions (Addendum)
Thank you for coming to the office today. ? ?Start Tamsulosin 0.4mg  daily with breakfast.  ? ?Caution with sudden standing can get light headed dizzy ? ?Try to scale back on the alcohol intake that can cause you to urinate more often and will make this more difficult to treat. ? ?If it helps but not strong enough, then after 2 weeks you can increase to 2 pills and we can order more. ? ? ?Benign Prostatic Hyperplasia ?Benign prostatic hyperplasia (BPH) is an enlarged prostate gland that is caused by the normal aging process. The prostate may get bigger as a man gets older. The condition is not caused by cancer. The prostate is a walnut-sized gland that is involved in the production of semen. It is located in front of the rectum and below the bladder. The bladder stores urine. The urethra carries stored urine out of the body. ?An enlarged prostate can press on the urethra. This can make it harder to pass urine. The buildup of urine in the bladder can cause infection. Back pressure and infection may progress to bladder damage and kidney (renal) failure. ?What are the causes? ?This condition is part of the normal aging process. However, not all men develop problems from this condition. If the prostate enlarges away from the urethra, urine flow will not be blocked. If it enlarges toward the urethra and compresses it, there will be problems passing urine. ?What increases the risk? ?This condition is more likely to develop in men older than 50 years. ?What are the signs or symptoms? ?Symptoms of this condition include: ?Getting up often during the night to urinate. ?Needing to urinate frequently during the day. ?Difficulty starting urine flow. ?Decrease in size and strength of your urine stream. ?Leaking (dribbling) after urinating. ?Inability to pass urine. This needs immediate treatment. ?Inability to completely empty your bladder. ?Pain when you pass urine. This is more common if there is also an infection. ?Urinary  tract infection (UTI). ?How is this diagnosed? ?This condition is diagnosed based on your medical history, a physical exam, and your symptoms. Tests will also be done, such as: ?A post-void bladder scan. This measures any amount of urine that may remain in your bladder after you finish urinating. ?A digital rectal exam. In a rectal exam, your health care provider checks your prostate by putting a lubricated, gloved finger into your rectum to feel the back of your prostate gland. This exam detects the size of your gland and any abnormal lumps or growths. ?An exam of your urine (urinalysis). ?A prostate specific antigen (PSA) screening. This is a blood test used to screen for prostate cancer. ?An ultrasound. This test uses sound waves to electronically produce a picture of your prostate gland. ?Your health care provider may refer you to a specialist in kidney and prostate diseases (urologist). ?How is this treated? ?Once symptoms begin, your health care provider will monitor your condition (active surveillance or watchful waiting). Treatment for this condition will depend on the severity of your condition. Treatment may include: ?Observation and yearly exams. This may be the only treatment needed if your condition and symptoms are mild. ?Medicines to relieve your symptoms, including: ?Medicines to shrink the prostate. ?Medicines to relax the muscle of the prostate. ?Surgery in severe cases. Surgery may include: ?Prostatectomy. In this procedure, the prostate tissue is removed completely through an open incision or with a laparoscope or robotics. ?Transurethral resection of the prostate (TURP). In this procedure, a tool is inserted through the opening at the  tip of the penis (urethra). It is used to cut away tissue of the inner core of the prostate. The pieces are removed through the same opening of the penis. This removes the blockage. ?Transurethral incision (TUIP). In this procedure, small cuts are made in the  prostate. This lessens the prostate's pressure on the urethra. ?Transurethral microwave thermotherapy (TUMT). This procedure uses microwaves to create heat. The heat destroys and removes a small amount of prostate tissue. ?Transurethral needle ablation (TUNA). This procedure uses radio frequencies to destroy and remove a small amount of prostate tissue. ?Interstitial laser coagulation (Edie). This procedure uses a laser to destroy and remove a small amount of prostate tissue. ?Transurethral electrovaporization (TUVP). This procedure uses electrodes to destroy and remove a small amount of prostate tissue. ?Prostatic urethral lift. This procedure inserts an implant to push the lobes of the prostate away from the urethra. ?Follow these instructions at home: ?Take over-the-counter and prescription medicines only as told by your health care provider. ?Monitor your symptoms for any changes. Contact your health care provider with any changes. ?Avoid drinking large amounts of liquid before going to bed or out in public. ?Avoid or reduce how much caffeine or alcohol you drink. ?Give yourself time when you urinate. ?Keep all follow-up visits. This is important. ?Contact a health care provider if: ?You have unexplained back pain. ?Your symptoms do not get better with treatment. ?You develop side effects from the medicine you are taking. ?Your urine becomes very dark or has a bad smell. ?Your lower abdomen becomes distended and you have trouble passing urine. ?Get help right away if: ?You have a fever or chills. ?You suddenly cannot urinate. ?You feel light-headed or very dizzy, or you faint. ?There are large amounts of blood or clots in your urine. ?Your urinary problems become hard to manage. ?You develop moderate to severe low back or flank pain. The flank is the side of your body between the ribs and the hip. ?These symptoms may be an emergency. Get help right away. Call 911. ?Do not wait to see if the symptoms will go  away. ?Do not drive yourself to the hospital. ?Summary ?Benign prostatic hyperplasia (BPH) is an enlarged prostate that is caused by the normal aging process. It is not caused by cancer. ?An enlarged prostate can press on the urethra. This can make it hard to pass urine. ?This condition is more likely to develop in men older than 50 years. ?Get help right away if you suddenly cannot urinate. ?This information is not intended to replace advice given to you by your health care provider. Make sure you discuss any questions you have with your health care provider. ?Document Revised: 12/23/2020 Document Reviewed: 12/23/2020 ?Elsevier Patient Education ? 2022 [employer]. ? ? ? ?Please schedule a Follow-up Appointment to: Return if symptoms worsen or fail to improve. ? ?If you have any other questions or concerns, please feel free to call the office or send a message through [facility]. You may also schedule an earlier appointment if necessary. ? ?Additionally, you may be receiving a survey about your experience at our office within a few days to 1 week by e-mail or mail. We value your feedback. ? ?Nobie Putnam, DO ?[facility] ?

## 2021-08-25 LAB — URINE CULTURE
MICRO NUMBER:: 13098232
Result:: NO GROWTH
SPECIMEN QUALITY:: ADEQUATE

## 2021-09-07 ENCOUNTER — Ambulatory Visit: Payer: Self-pay | Admitting: *Deleted

## 2021-09-07 NOTE — Telephone Encounter (Signed)
?  Chief Complaint: Itching, joint pain ?Symptoms: Severe itching, started with Flomax 08/24/21, worsening. Severe itching now."Thought it was my dry skin but has to be the Flomax." Last took med this AM. States "Thirty minutes later itching bad." ?Frequency: 2 weeks ?Pertinent Negatives: Patient denies Rash, swelling. ?Disposition: [] ED /[] Urgent Care (no appt availability in office) / [] Appointment(In office/virtual)/ []  [facility]/ [] Home Care/ [] Refused Recommended Disposition /[] [place] [facility]/ [x]  Follow-up with PCP ?Additional Notes: Please advise. ?[phone] ? Reason for Disposition ? [1] Caller has URGENT medicine question about med that PCP or specialist prescribed AND [2] triager unable to answer question ? ?Answer Assessment - Initial Assessment Questions ?1. NAME of MEDICATION: "What medicine are you calling about?" ?    Flomax ?2. QUESTION: "What is your question?" (e.g., double dose of medicine, side effect) ?    Alternate med ?3. PRESCRIBING HCP: "Who prescribed it?" Reason: if prescribed by specialist, call should be referred to that group. ?    PCP ?4. SYMPTOMS: "Do you have any symptoms?" ?    itching ?5. SEVERITY: If symptoms are present, ask "Are they mild, moderate or severe?" ?    Severe itching, joint pain ? ?Protocols used: Medication Question Call-A-AH ? ?

## 2021-09-07 NOTE — Telephone Encounter (Signed)
If he cannot tolerate Flomax, unfortunately I don't have too many options for him. ? ?The other medicines that are similar are the same category and may cause similar reaction. ? ?Only other one is an herbal OTC Saw Palmetto but probably not strong enough to solve his problem. ? ?My advice would be to refer him to Urologist next. ? ?Nobie Putnam, DO ?[facility] ?[facility] ?09/07/2021, 10:01 AM ? ?

## 2021-09-16 ENCOUNTER — Ambulatory Visit (INDEPENDENT_AMBULATORY_CARE_PROVIDER_SITE_OTHER): Payer: Medicaid Other | Admitting: Internal Medicine

## 2021-09-16 ENCOUNTER — Encounter: Payer: Self-pay | Admitting: Internal Medicine

## 2021-09-16 DIAGNOSIS — J449 Chronic obstructive pulmonary disease, unspecified: Secondary | ICD-10-CM

## 2021-09-16 DIAGNOSIS — Z87891 Personal history of nicotine dependence: Secondary | ICD-10-CM | POA: Diagnosis not present

## 2021-09-16 DIAGNOSIS — J4489 Other specified chronic obstructive pulmonary disease: Secondary | ICD-10-CM | POA: Insufficient documentation

## 2021-09-16 NOTE — Assessment & Plan Note (Signed)
Quit 2017 ?- referred for LCS  09/16/2021  ? ?Low-dose CT lung cancer screening is recommended for patients who ?are 95-58 years of age with a 20+ pack-year history of smoking and ?who are currently smoking or quit <=15 years ago. ?No coughing up blood  ?No unintentional weight loss of > 15 pounds in the last 6 months  ?>> referred for early decision making ? ? ?    ?  ? ?Each maintenance medication was reviewed in detail including emphasizing most importantly the difference between maintenance and prns and under what circumstances the prns are to be triggered using an action plan format where appropriate. ? ?Total time for H and P, chart review, counseling, reviewing hfa device(s) and generating customized AVS unique to this office visit / same day charting =  32 min  ?     ?

## 2021-09-16 NOTE — Patient Instructions (Addendum)
We will call you for lung cancer screening  ? ?No change in medications ? ?Keep working your weight ? ?Please schedule a follow up visit in 12 months but call sooner if needed  ?

## 2021-09-16 NOTE — Progress Notes (Signed)
? ?Tom [name], male    DOB: [birth year],    MRN: [id number] ? ? ?Brief patient profile:  ?51 yowm MM/quit smoking 2017 @ wt  240 not requiring any meds then but around spring 2022  doe rx with spriva dpi > " no better" and referred to pulmonary clinic in [facility]  04/13/2021 by Dr Gust Brooms  for copd eval       ? ? ? ? ?History of Present Illness  ?04/13/2021  Pulmonary/ 1st office eval/ Tom [name] / [employer]  Office  ?Chief Complaint  ?Patient presents with  ? Consult  ?  COPD- sob, coughing, wheezing,   ?Dyspnea:  MMRC2 = can't walk a nl pace on a flat grade s sob but does fine slow and flat  ?Cough: lots of cough/congestion 24/7 > just white mucus  ?Sleep: bed is flat, whole bunch of pillows ?SABA use: 4x daily and sometimes at night and uses sister's neb helps the most  ? Rec ?Plan A = Automatic = Always=    Symbicort Take 2 puffs first thing in am and then another 2 puffs about 12 hours later.  ? Work on inhaler technique  ?Prednisone 10 mg take  4 each am x 2 days,   2 each am x 2 days,  1 each am x 2 days and stop  ?Pantoprazole (protonix) 40 mg   Take  30-60 min before first meal of the day and Pepcid (famotidine)  20 mg after supper until return to office - this is the best way to tell whether stomach acid is contributing to your problem.   ?Plan B = Backup (to supplement plan A, not to replace it) ?Only use your albuterol inhaler as a rescue medication  ?GERD diet reviewed, bed blocks rec  ? ? ?05/25/2021  f/u ov/Adedamola Seto/ [facility] re: copd / AB    maint on symbicort/protonix/  ?Chief Complaint  ?Patient presents with  ? Follow-up  ?  DOE-  ?Dyspnea:  20 -30 min around a grocery store slow pace, can't do hills  ?Cough: some better  ?Sleeping: bed blocks/ but even sleeping  on pillows wakes up 3-4 h p lie down/ has to use nebulizer since ran out of symbicort160 and didn't know he could refill it  ?SABA use: 2-3 x per day / neb bid avg ?02: no  ?Covid status:  vax x 2  ?Rec ?Plan A = Automatic  = Always=   Symbicort 160 Take 2 puffs first thing in am and then another 2 puffs about 12 hours later.  ?Prednisone 10 mg take  4 each am x 2 days,   2 each am x 2 days,  1 each am x 2 days and stop  ?Plan B = Backup (to supplement plan A, not to replace it) ?Only use your albuterol inhaler as a rescue medication  ?Plan C = Crisis (instead of Plan B but only if Plan B stops working) ?- only use your albuterol nebulizer if you first try Plan B and it fails to help > ok to use the nebulizer up to every 4 hours but if start needing it regularly call for immediate appointment ?Ok to try albuterol 15 min before an activity (on alternating days)  that you know would usually make you short of breath  ?Plan D = Doctor ?- call me if B and C not adequate ?Plan E = ER ?- go to ER or call 911 if all else fails   ?Please schedule a  follow up visit in 3 months but call sooner if needed with PFT in meantime  ? ? ? ?09/16/2021  f/u ov/Rahi Chandonnet/ [facility] re: AB not copd    maint on symbicort 160   ?Chief Complaint  ?Patient presents with  ? Follow-up  ?  C/o sob, prod cough with clear sputum and wheezing.   ?  ?Dyspnea:  able to do [employer] / [employer] and no HC paking/ can now do hills  ?Cough: cough is better  ?Sleeping: all better on bed blocks  ?SABA use: much less hfa/ never neb ?02: none  ?Covid status:   vax x 3  ? ? ?No obvious day to day or daytime variability or assoc excess/ purulent sputum or mucus plugs or hemoptysis or cp or chest tightness, subjective wheeze or overt sinus or hb symptoms.  ? ?Sleeping  without nocturnal  or early am exacerbation  of respiratory  c/o's or need for noct saba. Also denies any obvious fluctuation of symptoms with weather or environmental changes or other aggravating or alleviating factors except as outlined above  ? ?No unusual exposure hx or h/o childhood pna/ asthma or knowledge of premature birth. ? ?Current Allergies, Complete Past Medical History, Past Surgical History, Family History,  and Social History were reviewed in [employer] record. ? ?ROS  The following are not active complaints unless bolded ?Hoarseness, sore throat, dysphagia, dental problems, itching, sneezing,  nasal congestion or discharge of excess mucus or purulent secretions, ear ache,   fever, chills, sweats, unintended wt loss or wt gain, classically pleuritic or exertional cp,  orthopnea pnd or arm/hand swelling  or leg swelling, presyncope, palpitations, abdominal pain, anorexia, nausea, vomiting, diarrhea  or change in bowel habits or change in bladder habits, change in stools or change in urine, dysuria, hematuria,  rash, arthralgias, visual complaints, headache, numbness, weakness or ataxia or problems with walking or coordination,  change in mood or  memory. ?      ? ?Current Meds  ?Medication Sig  ? albuterol (VENTOLIN HFA) 108 (90 Base) MCG/ACT inhaler INHALE 1 TO 2 PUFFS INTO THE LUNGS EVERY 4 HOURS AS NEEDED FOR WHEEZING OR SHORTNESS OF BREATH  ? atorvastatin (LIPITOR) 40 MG tablet Take 1 tablet (40 mg total) by mouth daily.  ? budesonide-formoterol (SYMBICORT) 80-4.5 MCG/ACT inhaler Take 2 puffs first thing in am and then another 2 puffs about 12 hours later.  ? chlorthalidone (HYGROTON) 25 MG tablet Take 1 tablet (25 mg total) by mouth daily.  ? famotidine (PEPCID) 20 MG tablet One after supper  ? losartan (COZAAR) 25 MG tablet Take 1 tablet (25 mg total) by mouth daily.  ? metFORMIN (GLUCOPHAGE) 500 MG tablet Take 2 tablets (1,000 mg total) by mouth 2 (two) times daily with a meal.  ? Multiple Vitamin (MULTIVITAMIN) capsule Take 1 capsule by mouth daily.  ? pantoprazole (PROTONIX) 40 MG tablet Take 1 tablet (40 mg total) by mouth daily. Take 30-60 min before first meal of the day  ? potassium chloride SA (KLOR-CON) 20 MEQ tablet Take 1 tablet (20 mEq total) by mouth daily.  ? tiZANidine (ZANAFLEX) 4 MG tablet Take 1 tablet (4 mg total) by mouth every 8 (eight) hours as needed for muscle  spasms.  ?     ? ?  ? ?   ? ?Past Medical History:  ?Diagnosis Date  ? Arthritis   ? COPD (chronic obstructive pulmonary disease) ([place])   ? Hyperlipidemia   ? Hypertension   ?  Osteoporosis   ? ?  ? ? ?Objective:  ?  ?09/16/2021       280   ?05/25/21 284 lb 3.2 oz (128.9 kg)  ?05/18/21 276 lb (125.2 kg)  ?05/04/21 270 lb (122.5 kg)  ?  ? ? ?Vital signs reviewed  05/25/2021  - Note at rest 02 sats  97% on RA  ? ?HEENT : pt wearing mask not removed for exam due to covid - 19 concerns.  ? ?NECK :  without JVD/Nodes/TM/ nl carotid upstrokes bilaterally ? ? ?LUNGS: no acc muscle use,  Min barrel  contour chest wall with bilateral  slightly decreased bs s audible wheeze and  without cough on insp or exp maneuvers and min  Hyperresonant  to  percussion bilaterally   ? ? ?CV:  RRR  no s3 or murmur or increase in P2, and trace R> L LE edema  ? ?ABD: obese  soft and nontender with pos end  insp Hoover's  in the supine position. No bruits or organomegaly appreciated, bowel sounds nl ? ?MS:   Nl gait/  ext warm without deformities, calf tenderness, cyanosis or clubbing ?No obvious joint restrictions  ? ?SKIN: warm and dry without lesions   ? ?NEURO:  alert, approp, nl sensorium with  no motor or cerebellar deficits apparent.  ?    ? ? ? ?  ?    ? ? ?  ? ?  ?  ?   ?Assessment  ? ?  ?  ?  ?  ?  ? ? ?    ?   ?    ?

## 2021-09-16 NOTE — Assessment & Plan Note (Signed)
Quit smoking 2017  ?- 04/13/2021  After extensive coaching inhaler device,  effectiveness =    75% from a baseline of nearly 0 > try symbicort 80 2bid  ?- 10/25/522  D dimer 1.13 >>>  04/14/21  Venous dopplers neg bilaterally  ?- 04/13/21  Eos 0.5  IgE  68 and alpha one phenotype  MM   Level 219  ?- 05/25/2021  After extensive coaching inhaler device,  effectiveness =    80% from baseline 50% (short ti)  ?- PFTs c/w asthma > rec symbicort 80 2bid 07/09/2021 >>>  Improved 09/16/2021  ? ?Much better on just the lower dose symbicort = 80 2bid ? ?- The proper method of use, as well as anticipated side effects, of a metered-dose inhaler were discussed and demonstrated to the patient using teach back method  ? ?Advised: The proper method of use, as well as anticipated side effects, of a metered-dose inhaler were discussed and demonstrated to the patient using teach back method. Improved effectiveness after extensive   ?If your breathing worsens or you need to use your rescue inhaler more than twice weekly or wake up more than twice a month with any respiratory symptoms or require more than two rescue inhalers per year, we need to see you right away because this means we're not controlling the underlying problem (inflammation) adequately. ? ?Rescue inhalers (albuterol) do not control inflammation and overuse can lead to unnecessary and costly consequences.  They can make you feel better temporarily but eventually they will quit working effectively much as sleep aids lead to more insomnia if used regularly.   ?

## 2021-09-27 ENCOUNTER — Telehealth: Payer: Self-pay | Admitting: Family Medicine

## 2021-09-27 DIAGNOSIS — Z96651 Presence of right artificial knee joint: Secondary | ICD-10-CM | POA: Diagnosis not present

## 2021-09-27 DIAGNOSIS — Z09 Encounter for follow-up examination after completed treatment for conditions other than malignant neoplasm: Secondary | ICD-10-CM | POA: Diagnosis not present

## 2021-09-27 DIAGNOSIS — N401 Enlarged prostate with lower urinary tract symptoms: Secondary | ICD-10-CM

## 2021-09-27 DIAGNOSIS — M25561 Pain in right knee: Secondary | ICD-10-CM | POA: Diagnosis not present

## 2021-09-27 DIAGNOSIS — Z87891 Personal history of nicotine dependence: Secondary | ICD-10-CM | POA: Diagnosis not present

## 2021-09-27 NOTE — Telephone Encounter (Signed)
Copied from [place] [id number]. Topic: General - Other ?>> Sep 27, 2021  2:56 PM Tessa Lerner A wrote: ?Reason for CRM: The patient would like to speak with a member of staff when possible about their newest prescription for tamsulosin (FLOMAX) 0.4 MG CAPS capsule [[id number]]  ?The patient would like to discuss potential side effects prior to beginning the medication  ?Please contact further when available ?

## 2021-09-28 DIAGNOSIS — Z981 Arthrodesis status: Secondary | ICD-10-CM | POA: Diagnosis not present

## 2021-09-28 MED ORDER — DOXAZOSIN MESYLATE 1 MG PO TABS: 1.0000 mg | ORAL_TABLET | Freq: Every day | ORAL | 0 refills | Status: DC

## 2021-09-28 NOTE — Telephone Encounter (Signed)
Patient returned our call. Pt states that he cannot take tamsulosin (Flomax). He took it for 2 weeks and it caused insomnia, constipation, itching and joint pain. ? ?He needs a different medication as he cannot take needing to go to the bathroom all of then time either. ? ?Please return pt's call.  ?

## 2021-09-28 NOTE — Telephone Encounter (Signed)
Please notify patient: ? ?Stop taking Tamsulosin ? ?New rx Doxazosin 1mg  daily sent to pharmacy. Different med may be better tolerated. If need can increase dose after 2 weeks to 2 pills, let me know, we can re order higher dose. 2 pills at same time = 2mg . ? ?If this doesn't work, we will need to refer to urologist next. ? ?I can go ahead and send referral if he is ready as well instead. ? ? , DO ?[facility] ?[facility] ?09/28/2021, 12:56 PM ? ?

## 2021-09-28 NOTE — Telephone Encounter (Signed)
Attempted to call patient regarding SE concerns- no answer and unable to leave call back message ?

## 2021-09-28 NOTE — Addendum Note (Signed)
Addended by: [name] on: 09/28/2021 12:57 PM ? ? Modules accepted: Orders ? ?

## 2021-09-29 NOTE — Telephone Encounter (Signed)
Spoke with the patient this morning and he has picked up the new medication. He said he would let us know if he begins having side effects from it.  ?

## 2021-10-04 ENCOUNTER — Other Ambulatory Visit: Payer: Self-pay | Admitting: Family Medicine

## 2021-10-04 DIAGNOSIS — J432 Centrilobular emphysema: Secondary | ICD-10-CM

## 2021-10-05 NOTE — Telephone Encounter (Signed)
Requested Prescriptions  ?Pending Prescriptions Disp Refills  ?? albuterol (VENTOLIN HFA) 108 (90 Base) MCG/ACT inhaler [Pharmacy Med Name: Albuterol Sulfate HFA 108 (90 Base) MCG/ACT Inhalation Aerosol Solution] 18 g 0  ?  Sig: INHALE 1 TO 2 PUFFS BY MOUTH EVERY 4 HOURS AS NEEDED FOR WHEEZING FOR SHORTNESS OF BREATH  ?  ? Pulmonology:  Beta Agonists 2 Passed - 10/04/2021  4:29 PM  ?  ?  Passed - Last BP in normal range  ?  BP Readings from Last 1 Encounters:  ?09/16/21 136/80  ?   ?  ?  Passed - Last Heart Rate in normal range  ?  Pulse Readings from Last 1 Encounters:  ?09/16/21 99  ?   ?  ?  Passed - Valid encounter within last 12 months  ?  Recent Outpatient Visits   ?      ? 1 month ago Benign prostatic hyperplasia with urinary hesitancy  ? [facility] [place], [name], DO  ? 1 month ago Morbid obesity [facility])  ? [facility] [place], [name], DO  ? 2 months ago Morbid obesity [facility])  ? [facility] [place], [name], DO  ? 4 months ago Acute midline thoracic back pain  ? [facility] [place], [name], DO  ? 5 months ago Acute midline thoracic back pain  ? [facility] [name], DO  ?  ?  ? ?  ?  ?  ? ?

## 2021-10-07 ENCOUNTER — Other Ambulatory Visit (HOSPITAL_COMMUNITY): Payer: Self-pay

## 2021-10-07 DIAGNOSIS — T84213A Breakdown (mechanical) of internal fixation device of bones of foot and toes, initial encounter: Secondary | ICD-10-CM | POA: Diagnosis not present

## 2021-10-07 DIAGNOSIS — M19071 Primary osteoarthritis, right ankle and foot: Secondary | ICD-10-CM | POA: Diagnosis not present

## 2021-10-07 DIAGNOSIS — Z981 Arthrodesis status: Secondary | ICD-10-CM | POA: Diagnosis not present

## 2021-10-07 DIAGNOSIS — M25471 Effusion, right ankle: Secondary | ICD-10-CM | POA: Diagnosis not present

## 2021-10-12 DIAGNOSIS — Z981 Arthrodesis status: Secondary | ICD-10-CM | POA: Diagnosis not present

## 2021-10-22 DIAGNOSIS — Z981 Arthrodesis status: Secondary | ICD-10-CM | POA: Diagnosis not present

## 2021-10-27 ENCOUNTER — Emergency Department
Admission: EM | Admit: 2021-10-27 | Discharge: 2021-10-27 | Disposition: A | Discharge status: Home / Self Care | Payer: Medicaid Other | Attending: Emergency Medicine | Admitting: Emergency Medicine

## 2021-10-27 ENCOUNTER — Emergency Department: Payer: Medicaid Other

## 2021-10-27 ENCOUNTER — Other Ambulatory Visit: Payer: Self-pay

## 2021-10-27 DIAGNOSIS — R7989 Other specified abnormal findings of blood chemistry: Secondary | ICD-10-CM | POA: Insufficient documentation

## 2021-10-27 DIAGNOSIS — I509 Heart failure, unspecified: Secondary | ICD-10-CM | POA: Insufficient documentation

## 2021-10-27 DIAGNOSIS — R0602 Shortness of breath: Secondary | ICD-10-CM

## 2021-10-27 DIAGNOSIS — J449 Chronic obstructive pulmonary disease, unspecified: Secondary | ICD-10-CM | POA: Diagnosis not present

## 2021-10-27 DIAGNOSIS — Z87891 Personal history of nicotine dependence: Secondary | ICD-10-CM | POA: Insufficient documentation

## 2021-10-27 DIAGNOSIS — I110 Hypertensive heart disease with heart failure: Secondary | ICD-10-CM | POA: Insufficient documentation

## 2021-10-27 LAB — BASIC METABOLIC PANEL
Anion gap: 10 (ref 5–15)
BUN: 19 mg/dL (ref 6–20)
CO2: 31 mmol/L (ref 22–32)
Calcium: 8.5 mg/dL — ABNORMAL LOW (ref 8.9–10.3)
Chloride: 92 mmol/L — ABNORMAL LOW (ref 98–111)
Creatinine, Ser: 1.52 mg/dL — ABNORMAL HIGH (ref 0.61–1.24)
GFR, Estimated: 53 mL/min — ABNORMAL LOW (ref >60)
Glucose, Bld: 131 mg/dL — ABNORMAL HIGH (ref 70–99)
Potassium: 4.2 mmol/L (ref 3.5–5.1)
Sodium: 133 mmol/L — ABNORMAL LOW (ref 135–145)

## 2021-10-27 LAB — CBC
HCT: 38.2 % — ABNORMAL LOW (ref 39.0–52.0)
Hemoglobin: 12.7 g/dL — ABNORMAL LOW (ref 13.0–17.0)
MCH: 32.2 pg (ref 26.0–34.0)
MCHC: 33.2 g/dL (ref 30.0–36.0)
MCV: 96.7 fL (ref 80.0–100.0)
Platelets: 202 10*3/uL (ref 150–400)
RBC: 3.95 MIL/uL — ABNORMAL LOW (ref 4.22–5.81)
RDW: 13.8 % (ref 11.5–15.5)
WBC: 10.9 10*3/uL — ABNORMAL HIGH (ref 4.0–10.5)
nRBC: 0 % (ref 0.0–0.2)

## 2021-10-27 LAB — TROPONIN I (HIGH SENSITIVITY): Troponin I (High Sensitivity): 26 ng/L — ABNORMAL HIGH (ref <18)

## 2021-10-27 LAB — BRAIN NATRIURETIC PEPTIDE: B Natriuretic Peptide: 119.8 pg/mL — ABNORMAL HIGH (ref 0.0–100.0)

## 2021-10-27 MED ORDER — FUROSEMIDE 10 MG/ML IJ SOLN
60.0000 mg | Freq: Once | INTRAMUSCULAR | Status: AC
  Administered 2021-10-27: 60 mg via INTRAVENOUS
  Filled 2021-10-27: qty 8

## 2021-10-27 MED ORDER — FUROSEMIDE 40 MG PO TABS: 40.0000 mg | ORAL_TABLET | Freq: Every day | ORAL | 0 refills | Status: DC

## 2021-10-27 NOTE — ED Provider Notes (Signed)
? ?[facility] ?Provider Note ? ? ? Event Date/Time  ? First MD Initiated Contact with Patient 10/27/21 [id number]   ?  (approximate) ? ?History  ? ?Chief Complaint: Shortness of Breath ? ?HPI ? ?JENZIEL WINTERBERG is a 56 y.o. male with a past medical history of COPD, hypertension, hyperlipidemia presents to the emergency department for shortness of breath.  According to the patient for the past several months he has been experiencing worsening shortness of breath but has become much worse over the past several days or week.  Patient states he recently saw his pulmonologist who diagnosed him with possible asthma.  Patient states a smoking history but quit 4 years ago.  Denies any chest pain nausea or diaphoresis.  Patient does state lower extremity edema worse in the right lower extremity where he is having a planned right ankle fusion/surgery in the near future. ? ?Physical Exam  ? ?Triage Vital Signs: ?ED Triage Vitals [10/27/21 0808]  ?Enc Vitals Group  ?   BP (!) 148/72  ?   Pulse Rate 100  ?   Resp (!) 22  ?   Temp 99.3 ?F (37.4 ?C)  ?   Temp Source Oral  ?   SpO2 94 %  ?   Weight   ?   Height   ?   Head Circumference   ?   Peak Flow   ?   Pain Score   ?   Pain Loc   ?   Pain Edu?   ?   Excl. in [place]?   ? ? ?Most recent vital signs: ?Vitals:  ? 10/27/21 0808  ?BP: (!) 148/72  ?Pulse: 100  ?Resp: (!) 22  ?Temp: 99.3 ?F (37.4 ?C)  ?SpO2: 94%  ? ? ?General: Awake, no distress.  ?CV:  Good peripheral perfusion.  Regular rate and rhythm around 100 bpm. ?Resp:  Normal effort.  Equal breath sounds bilaterally.  No wheeze rales or rhonchi. ?Abd:  No distention.  Soft, nontender.  No rebound or guarding. ?Other:  Patient has 2-3+ lower extremity edema somewhat worse in the right lower extremity compared to left lower extremity. ? ? ?ED Results / Procedures / Treatments  ? ?EKG ? ?EKG viewed and interpreted by myself shows a normal sinus rhythm at 97 bpm with a narrow QRS, normal axis, normal  intervals, no concerning ST changes. ? ?RADIOLOGY ? ?I have reviewed the chest x-ray images appears to have some interstitial edema. ?Radiology is read the x-ray as no acute finding. ? ? ?MEDICATIONS ORDERED IN ED: ?Medications - No data to display ? ? ?IMPRESSION / MDM / ASSESSMENT AND PLAN / ED COURSE  ?I reviewed the triage vital signs and the nursing notes. ? ?Patient presents to the emergency department for shortness of breath worsening over the past few months but more short of breath over the past week or so.  Patient has lower extremity edema denies taking any diuretics.  States he has been recently diagnosed with asthma however clear lung sounds on my evaluation with no wheeze rales or rhonchi.  Differential would include CHF/pulmonary edema, pneumonia, ACS, infectious etiology, pneumothorax.  We will check labs, chest x-ray and continue to closely monitor.  Patient agreeable to plan. ? ?Attempted to obtain a CTA of the chest.  Patient not able to tolerate CT scanning.  My suspicion for PE is very low.  States he cannot lay flat.  States shortness of breath gets worse if he lays flat.  Patient's presentation is very much consistent with fluid overload his BNP is elevated to 119.  Chest x-ray my evaluation appears to have interstitial edema.  Currently satting 96% on room air.  Patient strongly wishes to go home.  Received a dose of IV Lasix in the emergency department and has urinated a significant amount already.  We will discharge with oral Lasix for the next 7 days.  The remainder the patient's work-up is largely nonrevealing troponin minimally elevated, chemistry shows mild renal insufficiency.  CBC shows no concerning acute findings.  Patient agreeable to plan of care.  Discussed PCP follow-up as well as strict return precautions. ? ?FINAL CLINICAL IMPRESSION(S) / ED DIAGNOSES  ? ?Dyspnea ?CHF ? ?Note:  This document was prepared using Dragon voice recognition software and may include unintentional  dictation errors. ?  Harvest Dark, MD ?10/27/21 1206 ? ?

## 2021-10-27 NOTE — ED Triage Notes (Signed)
Pt increased SOB for the past month, states he has a hx of asthma, pt is in NAD on arrival. Pt has right ankle swelling, states he has ankle fusion surgery coming up soon. ?

## 2021-11-01 DIAGNOSIS — M25561 Pain in right knee: Secondary | ICD-10-CM | POA: Diagnosis not present

## 2021-11-01 DIAGNOSIS — Z96651 Presence of right artificial knee joint: Secondary | ICD-10-CM | POA: Diagnosis not present

## 2021-11-02 ENCOUNTER — Other Ambulatory Visit: Payer: Self-pay | Admitting: Family Medicine

## 2021-11-02 DIAGNOSIS — N401 Enlarged prostate with lower urinary tract symptoms: Secondary | ICD-10-CM

## 2021-11-02 MED ORDER — DOXAZOSIN MESYLATE 2 MG PO TABS: 2.0000 mg | ORAL_TABLET | Freq: Every day | ORAL | 2 refills | Status: DC

## 2021-11-07 ENCOUNTER — Other Ambulatory Visit: Payer: Self-pay | Admitting: Family Medicine

## 2021-11-07 DIAGNOSIS — J432 Centrilobular emphysema: Secondary | ICD-10-CM

## 2021-11-08 ENCOUNTER — Other Ambulatory Visit: Payer: Self-pay

## 2021-11-08 DIAGNOSIS — Z122 Encounter for screening for malignant neoplasm of respiratory organs: Secondary | ICD-10-CM

## 2021-11-08 DIAGNOSIS — Z87891 Personal history of nicotine dependence: Secondary | ICD-10-CM

## 2021-11-08 MED ORDER — ALBUTEROL SULFATE HFA 108 (90 BASE) MCG/ACT IN AERS: 2.0000 | INHALATION_SPRAY | RESPIRATORY_TRACT | 2 refills | Status: DC | PRN

## 2021-11-09 ENCOUNTER — Other Ambulatory Visit (HOSPITAL_COMMUNITY): Payer: Self-pay

## 2021-11-22 ENCOUNTER — Ambulatory Visit: Payer: Medicaid Other | Admitting: Family Medicine

## 2021-11-26 ENCOUNTER — Encounter: Payer: Self-pay | Admitting: Internal Medicine

## 2021-11-26 ENCOUNTER — Ambulatory Visit: Payer: Medicaid Other | Admitting: Family Medicine

## 2021-11-26 ENCOUNTER — Ambulatory Visit: Payer: Medicaid Other | Admitting: Internal Medicine

## 2021-11-26 DIAGNOSIS — J432 Centrilobular emphysema: Secondary | ICD-10-CM

## 2021-11-26 DIAGNOSIS — R0609 Other forms of dyspnea: Secondary | ICD-10-CM | POA: Diagnosis not present

## 2021-11-26 DIAGNOSIS — J449 Chronic obstructive pulmonary disease, unspecified: Secondary | ICD-10-CM | POA: Diagnosis not present

## 2021-11-26 MED ORDER — METHYLPREDNISOLONE ACETATE 80 MG/ML IJ SUSP
80.0000 mg | Freq: Once | INTRAMUSCULAR | Status: AC
  Administered 2021-11-26: 80 mg via INTRAMUSCULAR

## 2021-11-26 MED ORDER — ALBUTEROL SULFATE HFA 108 (90 BASE) MCG/ACT IN AERS: 2.0000 | INHALATION_SPRAY | RESPIRATORY_TRACT | 2 refills | Status: DC | PRN

## 2021-11-26 MED ORDER — FAMOTIDINE 20 MG PO TABS: ORAL_TABLET | ORAL | 11 refills | Status: DC

## 2021-11-26 MED ORDER — PANTOPRAZOLE SODIUM 40 MG PO TBEC: 40.0000 mg | DELAYED_RELEASE_TABLET | Freq: Every day | ORAL | 11 refills | Status: DC

## 2021-11-26 NOTE — Assessment & Plan Note (Signed)
Body mass index is 40.58 kg/m.  -  trending up  Lab Results  Component Value Date   TSH 1.780 04/13/2021      Contributing to doe and risk of worsening GERD >>>   reviewed the need and the process to achieve and maintain neg calorie balance > defer f/u primary care including intermittently monitoring thyroid status     Each maintenance medication was reviewed in detail including emphasizing most importantly the difference between maintenance and prns and under what circumstances the prns are to be triggered using an action plan format where appropriate.  Total time for H and P, chart review, counseling, reviewing hfa device(s) , directly observing portions of ambulatory 02 saturation study/ and generating customized AVS unique to this office visit / same day charting >4 0 min for multiple  refractory respiratory  symptoms of uncertain etiology

## 2021-11-26 NOTE — Assessment & Plan Note (Signed)
Quit smoking 2017  - 04/13/2021  After extensive coaching inhaler device,  effectiveness =    75% from a baseline of nearly 0 > try symbicort 80 2bid  - 10/25/522  D dimer 1.13 >>>  04/14/21  Venous dopplers neg bilaterally  - 04/13/21  Eos 0.5  IgE  68 and alpha one phenotype  MM   Level 219  - 05/25/2021  After extensive coaching inhaler device,  effectiveness =    80% from baseline 50% (short ti)  - PFTs c/w asthma > rec symbicort 80 2bid 07/09/2021 >>>  Improved 09/16/2021   ? Still has asthmatic component, hard to be sure  rec depomedrol 80 mg IM and  Continue low dose symbicort here  The proper method of use, as well as anticipated side effects, of a metered-dose inhaler were discussed and demonstrated to the patient using teach back method with an empty symbicort device  and the analogy of golfer taking practice swings

## 2021-11-26 NOTE — Progress Notes (Signed)
Tom [name], male    DOB: May 22, 1966,    MRN: [id number]   Brief patient profile:  51 yowm MM/quit smoking 2017 @ wt  240 not requiring any meds then but around spring 2022  doe rx with spriva dpi > " no better" and referred to pulmonary clinic in [facility], [facility], [facility]  04/13/2021 by Dr Gust Brooms  for copd eval           History of Present Illness  04/13/2021  Pulmonary/ 1st office eval/ [name] / [employer] Complaint  Patient presents with   Consult    COPD- sob, coughing, wheezing,   Dyspnea:  MMRC2 = can't walk a nl pace on a flat grade s sob but does fine slow and flat  Cough: lots of cough/congestion 24/7 > just white mucus  Sleep: bed is flat, whole bunch of pillows SABA use: 4x daily and sometimes at night and uses sister's neb helps the most   Rec Plan A = Automatic = Always=    Symbicort Take 2 puffs first thing in am and then another 2 puffs about 12 hours later.   Work on inhaler technique  Prednisone 10 mg take  4 each am x 2 days,   2 each am x 2 days,  1 each am x 2 days and stop  Pantoprazole (protonix) 40 mg   Take  30-60 min before first meal of the day and Pepcid (famotidine)  20 mg after supper until return to office - this is the best way to tell whether stomach acid is contributing to your problem.   Plan B = Backup (to supplement plan A, not to replace it) Only use your albuterol inhaler as a rescue medication  GERD diet reviewed, bed blocks rec    05/25/2021  f/u ov/[name]/ [facility] re: copd / AB    maint on symbicort/protonix/  Chief Complaint  Patient presents with   Follow-up    DOE-  Dyspnea:  20 -30 min around a grocery store slow pace, can't do hills  Cough: some better  Sleeping: bed blocks/ but even sleeping  on pillows wakes up 3-4 h p lie down/ has to use nebulizer since ran out of symbicort160 and didn't know he could refill it  SABA use: 2-3 x per day / neb bid avg 02: no  Covid status:  vax x 2  Rec Plan A = Automatic  = Always=   Symbicort 160 Take 2 puffs first thing in am and then another 2 puffs about 12 hours later.  Prednisone 10 mg take  4 each am x 2 days,   2 each am x 2 days,  1 each am x 2 days and stop  Plan B = Backup (to supplement plan A, not to replace it) Only use your albuterol inhaler as a rescue medication  Plan C = Crisis (instead of Plan B but only if Plan B stops working) - only use your albuterol nebulizer if you first try Plan B and it fails to help > ok to use the nebulizer up to every 4 hours but if start needing it regularly call for immediate appointment Ok to try albuterol 15 min before an activity (on alternating days)  that you know would usually make you short of breath  Plan D = Doctor - call me if B and C not adequate Plan E = ER - go to ER or call 911 if all else fails   Please schedule a  follow up visit in 3 months but call sooner if needed with PFT in meantime     09/16/2021  f/u ov/[name]/ [facility] re: AB not copd    maint on symbicort 160   Chief Complaint  Patient presents with   Follow-up    C/o sob, prod cough with clear sputum and wheezing.   Dyspnea:  able to do  [employer] and no HC paking/ can now do hills  Cough: cough is better  Sleeping: all better on bed blocks  SABA use: much less hfa/ never neb 02: none  Covid status:   vax x 3  Rec We will call you for lung cancer screening  No change in medications Keep working your weight   ER  10/27/21  pulled fluid off but didn't help breathing   11/26/2021  extended f/u ov/[name]/ [facility] re: AB copd    maint on symb 80 2bid/ saba  Chief Complaint  Patient presents with   Follow-up    SOB with exertion, wheezing and dry cough mainly in the morning.   Dyspnea:  more difficult to do [employer]/ gradually worse x months assoc with wt gain  To er with neg w/u rx lasix which helped swelling but not breathing and not Cough: to point of gagging / vomiting  Sleeping: 8 in bed blocks does fine but says  can't lie flat for CT scan s sob/gagging SABA use: symbicort 80/ neb helps  02: none    No obvious patterns in  day to day or daytime variability or assoc excess/ purulent sputum or mucus plugs or hemoptysis or cp or chest tightness, subjective wheeze or overt sinus or hb symptoms.   Sleeping  without nocturnal  or early am exacerbation  of respiratory  c/o's or need for noct saba. Also denies any obvious fluctuation of symptoms with weather or environmental changes or other aggravating or alleviating factors except as outlined above   No unusual exposure hx or h/o childhood pna/ asthma or knowledge of premature birth.  Current Allergies, Complete Past Medical History, Past Surgical History, Family History, and Social History were reviewed in [employer] record.  ROS  The following are not active complaints unless bolded Hoarseness, sore throat, dysphagia, dental problems, itching, sneezing,  nasal congestion or discharge of excess mucus or purulent secretions, ear ache,   fever, chills, sweats, unintended wt loss or wt gain, classically pleuritic or exertional cp,  orthopnea pnd or arm/hand swelling  or leg swelling, presyncope, palpitations, abdominal pain, anorexia, nausea, vomiting, diarrhea  or change in bowel habits or change in bladder habits, change in stools or change in urine, dysuria, hematuria,  rash, arthralgias, visual complaints, headache, numbness, weakness or ataxia or problems with walking or coordination,  change in mood or  memory.        Current Meds - - NOTE:   Unable to verify as accurately reflecting what pt takes    Medication Sig   albuterol (PROVENTIL) (2.5 MG/3ML) 0.083% nebulizer solution Take 3 mLs (2.5 mg total) by nebulization every 4 (four) hours as needed for wheezing or shortness of breath.   albuterol (VENTOLIN HFA) 108 (90 Base) MCG/ACT inhaler Inhale 2 puffs into the lungs every 4 (four) hours as needed for wheezing or shortness of  breath.   atorvastatin (LIPITOR) 40 MG tablet Take 1 tablet (40 mg total) by mouth daily.   budesonide-formoterol (SYMBICORT) 80-4.5 MCG/ACT inhaler Take 2 puffs first thing in am and then another 2 puffs  about 12 hours later.   chlorthalidone (HYGROTON) 25 MG tablet Take 1 tablet (25 mg total) by mouth daily.   doxazosin (CARDURA) 2 MG tablet Take 1 tablet (2 mg total) by mouth daily.   famotidine (PEPCID) 20 MG tablet One after supper   losartan (COZAAR) 25 MG tablet Take 1 tablet (25 mg total) by mouth daily.   Multiple Vitamin (MULTIVITAMIN) capsule Take 1 capsule by mouth daily.   pantoprazole (PROTONIX) 40 MG tablet Take 1 tablet (40 mg total) by mouth daily. Take 30-60 min before first meal of the day   potassium chloride SA (KLOR-CON) 20 MEQ tablet Take 1 tablet (20 mEq total) by mouth daily.   tiZANidine (ZANAFLEX) 4 MG tablet Take 1 tablet (4 mg total) by mouth every 8 (eight) hours as needed for muscle spasms.                 Past Medical History:  Diagnosis Date   Arthritis    COPD (chronic obstructive pulmonary disease) ([place])    Hyperlipidemia    Hypertension    Osteoporosis        Objective:     11/26/2021         299  09/16/2021       280   05/25/21 284 lb 3.2 oz (128.9 kg)  05/18/21 276 lb (125.2 kg)  05/04/21 270 lb (122.5 kg)      Vital signs reviewed  11/26/2021  - Note at rest 02 sats  94% on RA   General appearance:    massively obese hoarse wm/ classic pseudowheeze   HEENT : Oropharynx  clear      Nasal turbinates mild edema   NECK :  without  appent JVD/ palpable Nodes/TM    LUNGS: no acc muscle use,  Nl contour chest which is clear to A and P bilaterally without cough on insp or exp maneuvers   CV:  RRR  no s3 or murmur or increase in P2, and 2+ edema L  (says long time) trace on R   ABD:  massively obese but soft and nontender with limited inspiratory excursion in the supine position(flat did not cause gag but immediate sob) . No bruits or  organomegaly appreciated   MS:  Nl gait/ ext warm without deformities Or obvious joint restrictions  calf tenderness, cyanosis or clubbing    SKIN: warm and dry without lesions    NEURO:  alert, approp, nl sensorium with  no motor or cerebellar deficits apparent.    I personally reviewed images and agree with radiology impression as follows:  CXR:   pa and lat done for sob  10/27/21 No acute cardiopulmonary findings. Stable calcified granulomas. My review: relatively small lung vol, mild cm       Assessment

## 2021-11-26 NOTE — Assessment & Plan Note (Signed)
Quit smoking 2017  - 04/13/2021  After extensive coaching inhaler device,  effectiveness =    75% from a baseline of nearly 0 > try symbicort 80 2bid  - 10/25/522  D dimer 1.13 >>>  04/14/21  Venous dopplers neg bilaterally  - 04/13/21  Eos 0.5  IgE  68 and alpha one phenotype  MM   Level 219  - 05/25/2021  After extensive coaching inhaler device,  effectiveness =    80% from baseline 50% (short ti)  - PFTs c/w asthma > rec symbicort 80 2bid 07/09/2021 >>>  Improved 09/16/2021  - worse since 09/16/21 assoc with wt gain/ gagging  - 11/26/2021   Walked on ra  x  3  lap(s) =  approx 525  ft  @ nl pace, stopped due to end of study  with lowest 02 sats 93% and sub sob / pseudowheeze   Wt gain / pseudowheeze assoc with gagging to point of vomit are typical of Upper airway cough syndrome (previously labeled PNDS),  is so named because it's frequently impossible to sort out how much is  CR/sinusitis with freq throat clearing (which can be related to primary GERD)   vs  causing  secondary (" extra esophageal")  GERD from wide swings in gastric pressure that occur with throat clearing, often  promoting self use of mint and menthol lozenges that reduce the lower esophageal sphincter tone and exacerbate the problem further in a cyclical fashion.   These are the same pts (now being labeled as having "irritable larynx syndrome" by some cough centers) who not infrequently have a history of having failed to tolerate ace inhibitors,  dry powder inhalers or biphosphonates or report having atypical/extraesophageal reflux symptoms that don't respond to standard doses of PPI  and are easily confused as having aecopd or asthma flares by even experienced allergists/ pulmonologists (myself included).   rec  Max rx for GERD / f/u in 4 week  with all meds in hand using a trust but verify approach to confirm accurate Medication  Reconciliation The principal here is that until we are certain that the  patients are doing what we've  asked, it makes no sense to ask them to do more.

## 2021-11-26 NOTE — Patient Instructions (Addendum)
Pantoprazole (protonix) 40 mg   Take  30-60 min before first meal of the day and Pepcid (famotidine)  20 mg after supper until return to office - this is the best way to tell whether stomach acid is contributing to your problem.    GERD (REFLUX)  is an extremely common cause of respiratory symptoms just like yours , many times with no obvious heartburn at all.    It can be treated with medication, but also with lifestyle changes including elevation of the head of your bed (ideally with 6 -8inch blocks under the headboard of your bed),  Smoking cessation, avoidance of late meals, excessive alcohol, and avoid fatty foods, chocolate, peppermint, colas, red wine, and acidic juices such as orange juice.  NO MINT OR MENTHOL PRODUCTS SO NO COUGH DROPS  USE SUGARLESS CANDY INSTEAD (Jolley ranchers or Stover's or Life Savers) or even ice chips will also do - the key is to swallow to prevent all throat clearing. NO OIL BASED VITAMINS - use powdered substitutes.  Avoid fish oil when coughing.   Plan A = Automatic = Always=    symbicort 80 Take 2 puffs first thing in am and then another 2 puffs about 12 hours later.    Work on inhaler technique:  relax and gently blow all the way out then take a nice smooth full deep breath back in, triggering the inhaler at same time you start breathing in.  Hold for up to 5 seconds if you can. Blow out thru nose. Rinse and gargle with water when done.  If mouth or throat bother you at all,  try brushing teeth/gums/tongue with arm and hammer toothpaste/ make a slurry and gargle and spit out.      Plan B = Backup (to supplement plan A, not to replace it) Only use your albuterol inhaler as a rescue medication to be used if you can't catch your breath by resting or doing a relaxed purse lip breathing pattern.  - The less you use it, the better it will work when you need it. - Ok to use the inhaler up to 2 puffs  every 4 hours if you must but call for appointment if use goes up  over your usual need - Don't leave home without it !!  (think of it like the spare tire for your car)   Plan C = Crisis (instead of Plan B but only if Plan B stops working) - only use your albuterol nebulizer if you first try Plan B and it fails to help > ok to use the nebulizer up to every 4 hours but if start needing it regularly call for immediate appointment  Depomedrol 80 mg IM today  Please schedule a follow up office visit in 4 weeks, sooner if needed  with all medications /inhalers/ solutions in hand so we can verify exactly what you are taking. This includes all medications from all doctors and over the counters

## 2021-11-30 ENCOUNTER — Encounter: Payer: Self-pay | Admitting: Acute Care

## 2021-11-30 ENCOUNTER — Ambulatory Visit (INDEPENDENT_AMBULATORY_CARE_PROVIDER_SITE_OTHER): Payer: Medicaid Other | Admitting: Acute Care

## 2021-11-30 DIAGNOSIS — Z87891 Personal history of nicotine dependence: Secondary | ICD-10-CM | POA: Diagnosis not present

## 2021-11-30 NOTE — Patient Instructions (Signed)
Thank you for participating in the [place] Cancer Screening Program. It was our pleasure to meet you today. We will call you with the results of your scan within the next few days. Your scan will be assigned a Lung RADS category score by the physicians reading the scans.  This Lung RADS score determines follow up scanning.  See below for description of categories, and follow up screening recommendations. We will be in touch to schedule your follow up screening annually or based on recommendations of our providers. We will fax a copy of your scan results to your Primary Care Physician, or the physician who referred you to the program, to ensure they have the results. Please call the office if you have any questions or concerns regarding your scanning experience or results.  Our office number is [phone]. Please speak with Denise Phelps, RN. , or  Denise Buckner RN, They are  our Lung Cancer Screening RN.'s If They are unavailable when you call, Please leave a message on the voice mail. We will return your call at our earliest convenience.This voice mail is monitored several times a day.  Remember, if your scan is normal, we will scan you annually as long as you continue to meet the criteria for the program. (Age 55-77, Current smoker or smoker who has quit within the last 15 years). If you are a smoker, remember, quitting is the single most powerful action that you can take to decrease your risk of lung cancer and other pulmonary, breathing related problems. We know quitting is hard, and we are here to help.  Please let us know if there is anything we can do to help you meet your goal of quitting. If you are a former smoker, congratulations. We are proud of you! Remain smoke free! Remember you can refer friends or family members through the number above.  We will screen them to make sure they meet criteria for the program. Thank you for helping us take better care of you by  participating in Lung Screening.  You can receive free nicotine replacement therapy ( patches, gum or mints) by calling 1-800-QUIT NOW. Please call so we can get you on the path to becoming  a non-smoker. I know it is hard, but you can do this!  Lung RADS Categories:  Lung RADS 1: no nodules or definitely non-concerning nodules.  Recommendation is for a repeat annual scan in 12 months.  Lung RADS 2:  nodules that are non-concerning in appearance and behavior with a very low likelihood of becoming an active cancer. Recommendation is for a repeat annual scan in 12 months.  Lung RADS 3: nodules that are probably non-concerning , includes nodules with a low likelihood of becoming an active cancer.  Recommendation is for a 6-month repeat screening scan. Often noted after an upper respiratory illness. We will be in touch to make sure you have no questions, and to schedule your 6-month scan.  Lung RADS 4 A: nodules with concerning findings, recommendation is most often for a follow up scan in 3 months or additional testing based on our provider's assessment of the scan. We will be in touch to make sure you have no questions and to schedule the recommended 3 month follow up scan.  Lung RADS 4 B:  indicates findings that are concerning. We will be in touch with you to schedule additional diagnostic testing based on our provider's  assessment of the scan.  Other options for assistance in smoking cessation (   As covered by your insurance benefits)  Hypnosis for smoking cessation  [employer] [phone]  Acupuncture for smoking cessation  [facility] [phone]   

## 2021-11-30 NOTE — Progress Notes (Signed)
Virtual Visit via Telephone Note  I connected with Tom [name] on 11/23/21 at  3:30 PM EDT by telephone and verified that I am speaking with the correct person using two identifiers.  Location: Patient: At home Provider: [address], [place], [place], [address]    I discussed the limitations, risks, security and privacy concerns of performing an evaluation and management service by telephone and the availability of in person appointments. I also discussed with the patient that there may be a patient responsible charge related to this service. The patient expressed understanding and agreed to proceed.   Shared Decision Making Visit Lung Cancer Screening Program [phone])   Eligibility: Age 56 y.o. Pack Years Smoking History Calculation 39 pack year smoking history (# packs/per year x # years smoked) Recent History of coughing up blood  no Unexplained weight loss? no ( >Than 15 pounds within the last 6 months ) Prior History Lung / other cancer no (Diagnosis within the last 5 years already requiring surveillance chest CT Scans). Smoking Status Former Smoker Former Smokers: Years since quit: 5 years 11 months  Quit Date: 12/2015  Visit Components: Discussion included one or more decision making aids. yes Discussion included risk/benefits of screening. yes Discussion included potential follow up diagnostic testing for abnormal scans. yes Discussion included meaning and risk of over diagnosis. yes Discussion included meaning and risk of False Positives. yes Discussion included meaning of total radiation exposure. yes  Counseling Included: Importance of adherence to annual lung cancer LDCT screening. yes Impact of comorbidities on ability to participate in the program. yes Ability and willingness to under diagnostic treatment. yes  Smoking Cessation Counseling: Current Smokers:  Discussed importance of smoking cessation. yes Information about tobacco cessation  classes and interventions provided to patient. yes Patient provided with "ticket" for LDCT Scan. yes Symptomatic Patient. no  Counseling NA Diagnosis Code: Tobacco Use Z72.0 Asymptomatic Patient yes  Counseling (Intermediate counseling: > three minutes counseling) F0071 Former Smokers:  Discussed the importance of maintaining cigarette abstinence. yes Diagnosis Code: Personal History of Nicotine Dependence. Q19.758 Information about tobacco cessation classes and interventions provided to patient. Yes Patient provided with "ticket" for LDCT Scan. yes Written Order for Lung Cancer Screening with LDCT placed in Epic. Yes (CT Chest Lung Cancer Screening Low Dose W/O CM) [id number] Z12.2-Screening of respiratory organs Z87.891-Personal history of nicotine dependence  I spent 25 minutes of face to face time/virtual visit time  with  Tom [name] discussing the risks and benefits of lung cancer screening. We took the time to pause the power point at intervals to allow for questions to be asked and answered to ensure understanding. We discussed that he had taken the single most powerful action possible to decrease his risk of developing lung cancer when he quit smoking. I counseled him to remain smoke free, and to contact me if he ever had the desire to smoke again so that I can provide resources and tools to help support the effort to remain smoke free. We discussed the time and location of the scan, and that either  Abigail Miyamoto RN, Karlton Lemon, RN or I  or I will call / send a letter with the results within  24-72 hours of receiving them. He has the office contact information in the event he needs to speak with me,  he verbalized understanding of all of the above and had no further questions upon leaving the office.     I explained to the patient that there  has been a high incidence of coronary artery disease noted on these exams. I explained that this is a non-gated exam therefore degree or  severity cannot be determined. This patient is on statin therapy. I have asked the patient to follow-up with their PCP regarding any incidental finding of coronary artery disease and management with diet or medication as they feel is clinically indicated. The patient verbalized understanding of the above and had no further questions.     Bevelyn Ngo, NP 11/30/2021

## 2021-12-02 ENCOUNTER — Emergency Department: Payer: Medicaid Other

## 2021-12-02 ENCOUNTER — Emergency Department
Admission: EM | Admit: 2021-12-02 | Discharge: 2021-12-02 | Disposition: A | Discharge status: Home / Self Care | Payer: Medicaid Other | Attending: Emergency Medicine | Admitting: Emergency Medicine

## 2021-12-02 ENCOUNTER — Other Ambulatory Visit: Payer: Self-pay

## 2021-12-02 DIAGNOSIS — R778 Other specified abnormalities of plasma proteins: Secondary | ICD-10-CM | POA: Insufficient documentation

## 2021-12-02 DIAGNOSIS — K219 Gastro-esophageal reflux disease without esophagitis: Secondary | ICD-10-CM

## 2021-12-02 DIAGNOSIS — I2510 Atherosclerotic heart disease of native coronary artery without angina pectoris: Secondary | ICD-10-CM | POA: Insufficient documentation

## 2021-12-02 DIAGNOSIS — I10 Essential (primary) hypertension: Secondary | ICD-10-CM | POA: Diagnosis not present

## 2021-12-02 DIAGNOSIS — J449 Chronic obstructive pulmonary disease, unspecified: Secondary | ICD-10-CM | POA: Diagnosis not present

## 2021-12-02 DIAGNOSIS — R0602 Shortness of breath: Secondary | ICD-10-CM | POA: Insufficient documentation

## 2021-12-02 DIAGNOSIS — R110 Nausea: Secondary | ICD-10-CM | POA: Diagnosis not present

## 2021-12-02 LAB — BASIC METABOLIC PANEL
Anion gap: 12 (ref 5–15)
BUN: 17 mg/dL (ref 6–20)
CO2: 26 mmol/L (ref 22–32)
Calcium: 8.6 mg/dL — ABNORMAL LOW (ref 8.9–10.3)
Chloride: 95 mmol/L — ABNORMAL LOW (ref 98–111)
Creatinine, Ser: 1.1 mg/dL (ref 0.61–1.24)
GFR, Estimated: >60 mL/min (ref >60)
Glucose, Bld: 132 mg/dL — ABNORMAL HIGH (ref 70–99)
Potassium: 3.8 mmol/L (ref 3.5–5.1)
Sodium: 133 mmol/L — ABNORMAL LOW (ref 135–145)

## 2021-12-02 LAB — CBC
HCT: 36.9 % — ABNORMAL LOW (ref 39.0–52.0)
Hemoglobin: 12.6 g/dL — ABNORMAL LOW (ref 13.0–17.0)
MCH: 33.4 pg (ref 26.0–34.0)
MCHC: 34.1 g/dL (ref 30.0–36.0)
MCV: 97.9 fL (ref 80.0–100.0)
Platelets: 135 10*3/uL — ABNORMAL LOW (ref 150–400)
RBC: 3.77 MIL/uL — ABNORMAL LOW (ref 4.22–5.81)
RDW: 14 % (ref 11.5–15.5)
WBC: 7.5 10*3/uL (ref 4.0–10.5)
nRBC: 0 % (ref 0.0–0.2)

## 2021-12-02 LAB — TROPONIN I (HIGH SENSITIVITY): Troponin I (High Sensitivity): 24 ng/L — ABNORMAL HIGH (ref <18)

## 2021-12-02 MED ORDER — IPRATROPIUM-ALBUTEROL 0.5-2.5 (3) MG/3ML IN SOLN
3.0000 mL | Freq: Once | RESPIRATORY_TRACT | Status: AC
  Administered 2021-12-02: 3 mL via RESPIRATORY_TRACT
  Filled 2021-12-02: qty 3

## 2021-12-02 MED ORDER — ALUM & MAG HYDROXIDE-SIMETH 200-200-20 MG/5ML PO SUSP
30.0000 mL | Freq: Once | ORAL | Status: AC
  Administered 2021-12-02: 30 mL via ORAL
  Filled 2021-12-02: qty 30

## 2021-12-02 MED ORDER — ONDANSETRON 4 MG PO TBDP
4.0000 mg | ORAL_TABLET | Freq: Once | ORAL | Status: AC
  Administered 2021-12-02: 4 mg via ORAL
  Filled 2021-12-02: qty 1

## 2021-12-02 MED ORDER — CIPROFLOXACIN HCL 500 MG PO TABS
500.0000 mg | ORAL_TABLET | Freq: Once | ORAL | Status: DC
Start: 2021-12-02 — End: 2021-12-02
  Filled 2021-12-02: qty 1

## 2021-12-02 NOTE — ED Triage Notes (Signed)
Patient to ER via POV, reports having acid reflux. States last night he started experiencing the same symptoms as well as shortness of breath and has been unable to keep anything down. States that he is throwing up green emesis. States he was able to get down his reflux medication, called his pcp who did not have any appointments.

## 2021-12-02 NOTE — ED Provider Notes (Signed)
[facility] Provider Note    Event Date/Time   First MD Initiated Contact with Patient 12/02/21 1227     (approximate)   History   Shortness of Breath   HPI  Tom [name] is a 56 y.o. male with history of COPD, hypertension, CAD who presents with complaints of shortness of breath and acid reflux.  Patient reports he has significant difficulty with GERD, frequently wakes up with chest and throat discomfort and sometimes this affects his breathing.  Reports compliance with his medications but today felt nauseated.     Physical Exam   Triage Vital Signs: ED Triage Vitals  Enc Vitals Group     BP 12/02/21 1220 (!) 143/72     Pulse Rate 12/02/21 1220 87     Resp 12/02/21 1220 20     Temp 12/02/21 1220 97.8 F (36.6 C)     Temp src --      SpO2 12/02/21 1220 94 %     Weight 12/02/21 1312 135.7 kg (299 lb 2.6 oz)     Height 12/02/21 1219 1.829 m (6')     Head Circumference --      Peak Flow --      Pain Score 12/02/21 1219 0     Pain Loc --      Pain Edu? --      Excl. in GC? --     Most recent vital signs: Vitals:   12/02/21 1220  BP: (!) 143/72  Pulse: 87  Resp: 20  Temp: 97.8 F (36.6 C)  SpO2: 94%     General: Awake, no distress.  CV:  Good peripheral perfusion.  Resp:  Normal effort.  Scattered mild wheezing Abd:  No distention.  Other:     ED Results / Procedures / Treatments   Labs (all labs ordered are listed, but only abnormal results are displayed) Labs Reviewed  BASIC METABOLIC PANEL - Abnormal; Notable for the following components:      Result Value   Sodium 133 (*)    Chloride 95 (*)    Glucose, Bld 132 (*)    Calcium 8.6 (*)    All other components within normal limits  CBC - Abnormal; Notable for the following components:   RBC 3.77 (*)    Hemoglobin 12.6 (*)    HCT 36.9 (*)    Platelets 135 (*)    All other components within normal limits  TROPONIN I (HIGH SENSITIVITY) - Abnormal; Notable for  the following components:   Troponin I (High Sensitivity) 24 (*)    All other components within normal limits     EKG  ED ECG REPORT I, Jene Every, the attending physician, personally viewed and interpreted this ECG.  Date: 12/02/2021  Rhythm: normal sinus rhythm QRS Axis: normal Intervals: normal ST/T Wave abnormalities: normal Narrative Interpretation: no evidence of acute ischemia    RADIOLOGY Chest x-ray viewed interpreted by me, no acute abnormality    PROCEDURES:  Critical Care performed:   Procedures   MEDICATIONS ORDERED IN ED: Medications  ciprofloxacin (CIPRO) tablet 500 mg (500 mg Oral Not Given 12/02/21 1311)  alum & mag hydroxide-simeth (MAALOX/MYLANTA) 200-200-20 MG/5ML suspension 30 mL (30 mLs Oral Given 12/02/21 1311)  ondansetron (ZOFRAN-ODT) disintegrating tablet 4 mg (4 mg Oral Given 12/02/21 1311)  ipratropium-albuterol (DUONEB) 0.5-2.5 (3) MG/3ML nebulizer solution 3 mL (3 mLs Nebulization Given 12/02/21 1311)     IMPRESSION / MDM / ASSESSMENT AND PLAN / ED COURSE  I reviewed the triage vital signs and the nursing notes. Patient's presentation is most consistent with severe exacerbation of chronic illness.  Patient presents with shortness of breath as detailed above as well as chest discomfort.  Differential includes COPD exacerbation, acid reflux, less likely ACS given reassuring EKG, doubt pneumonia  Chest x-ray unchanged from prior, no evidence of pneumonia  High sensitive troponin is chronically mildly elevated, no change  Patient treated with GI cocktail, ODT Zofran, breathing treatment with resolution of symptoms  Patient feels better and would like to leave, he knows he can return anytime if worsening symptoms, will refer the patient to GI        FINAL CLINICAL IMPRESSION(S) / ED DIAGNOSES   Final diagnoses:  SOB (shortness of breath)  Gastroesophageal reflux disease, unspecified whether esophagitis present     Rx / DC  Orders   ED Discharge Orders     None        Note:  This document was prepared using Dragon voice recognition software and may include unintentional dictation errors.   Jene Every, MD 12/02/21 1359

## 2021-12-04 ENCOUNTER — Other Ambulatory Visit: Payer: Self-pay

## 2021-12-04 ENCOUNTER — Emergency Department
Admission: EM | Admit: 2021-12-04 | Discharge: 2021-12-04 | Disposition: A | Discharge status: Home / Self Care | Payer: Medicaid Other | Attending: Emergency Medicine | Admitting: Emergency Medicine

## 2021-12-04 DIAGNOSIS — R1030 Lower abdominal pain, unspecified: Secondary | ICD-10-CM | POA: Insufficient documentation

## 2021-12-04 DIAGNOSIS — I10 Essential (primary) hypertension: Secondary | ICD-10-CM | POA: Diagnosis not present

## 2021-12-04 DIAGNOSIS — I2510 Atherosclerotic heart disease of native coronary artery without angina pectoris: Secondary | ICD-10-CM | POA: Insufficient documentation

## 2021-12-04 DIAGNOSIS — R339 Retention of urine, unspecified: Secondary | ICD-10-CM | POA: Diagnosis present

## 2021-12-04 DIAGNOSIS — J449 Chronic obstructive pulmonary disease, unspecified: Secondary | ICD-10-CM | POA: Insufficient documentation

## 2021-12-04 DIAGNOSIS — R0602 Shortness of breath: Secondary | ICD-10-CM | POA: Insufficient documentation

## 2021-12-04 DIAGNOSIS — R3911 Hesitancy of micturition: Secondary | ICD-10-CM | POA: Diagnosis not present

## 2021-12-04 LAB — URINALYSIS, COMPLETE (UACMP) WITH MICROSCOPIC
Bacteria, UA: NONE SEEN
Bilirubin Urine: NEGATIVE
Glucose, UA: NEGATIVE mg/dL
Ketones, ur: NEGATIVE mg/dL
Leukocytes,Ua: NEGATIVE
Nitrite: NEGATIVE
Protein, ur: NEGATIVE mg/dL
Specific Gravity, Urine: 1.018 (ref 1.005–1.030)
pH: 7 (ref 5.0–8.0)

## 2021-12-04 NOTE — ED Notes (Signed)
Patient declined discharge vital signs. 

## 2021-12-04 NOTE — ED Provider Notes (Signed)
[facility] Provider Note    Event Date/Time   First MD Initiated Contact with Patient 12/04/21 1257     (approximate)   History   Urinary Retention   HPI  Tom [name] is a 56 y.o. male   with history of COPD, hypertension, CAD and recent ED evaluation on 6/15 for assessment of shortness of breath discharged after breathing treatment and a GI cocktail presents for evaluation with concern for urinary retention.  Patient states he has not peed in over 8 hours with some developing pressure in the suprapubic lower abdominal region since then.  States this is never happened before.  He is never seen a urologist.  States that his PCP had started him on some treatment for possibly enlarged prostate but he is not exactly sure the name as he had been allergic to Flomax which he had tried a couple months ago.  On review of records it seems this was doxazosin. this was started to symptoms related to some hesitancy.  He states that prior to the last 8 hours he has been feeling much better after leaving the ED 6/15 has not had any subsequent shortness of breath, cough, chest pain, headache, earache, sore throat, fevers, back pain or any nausea vomiting or diarrhea.  He denies any burning with urination or blood in his urine prior to the cessation of urine output.      Physical Exam  Triage Vital Signs: ED Triage Vitals  Enc Vitals Group     BP 12/04/21 1250 132/74     Pulse Rate 12/04/21 1250 97     Resp 12/04/21 1250 18     Temp 12/04/21 1250 98.3 F (36.8 C)     Temp Source 12/04/21 1250 Oral     SpO2 12/04/21 1250 92 %     Weight --      Height --      Head Circumference --      Peak Flow --      Pain Score 12/04/21 1254 0     Pain Loc --      Pain Edu? --      Excl. in GC? --     Most recent vital signs: Vitals:   12/04/21 1250  BP: 132/74  Pulse: 97  Resp: 18  Temp: 98.3 F (36.8 C)  SpO2: 92%    General: Awake, appears mildly  uncomfortable. CV:  Good peripheral perfusion.  Resp:  Normal effort.  Clear bilaterally. Abd:  No distention.  Mildly tender in suprapubic region but otherwise soft throughout.  No CVA tenderness. Other:     ED Results / Procedures / Treatments  Labs (all labs ordered are listed, but only abnormal results are displayed) Labs Reviewed  URINALYSIS, COMPLETE (UACMP) WITH MICROSCOPIC - Abnormal; Notable for the following components:      Result Value   Color, Urine YELLOW (*)    APPearance CLEAR (*)    Hgb urine dipstick SMALL (*)    All other components within normal limits     EKG    RADIOLOGY   PROCEDURES:  Critical Care performed: No  Procedures    MEDICATIONS ORDERED IN ED: Medications - No data to display   IMPRESSION / MDM / ASSESSMENT AND PLAN / ED COURSE  I reviewed the triage vital signs and the nursing notes. Patient's presentation is most consistent with acute, uncomplicated illness.  Differential diagnosis includes, but is not limited to urinary retention, cystitis, and bladder spasm.  On bedside ultrasound obtained self there is approximately 200 cc of urine.  Bladder scan from bladder scanner only showed about 50 cc.  However somewhat difficult to tell based on habitus.  Patient was able to make a small amount of urine although unfortunately it was not captured in urine cup.  We did obtain a subsequent in and out catheter sample which only put out slightly less than 200 cc and this was sent for urinalysis.   Urinalysis has a small hemoglobin which I suspect is likely traumatic from in and out catheterization but otherwise has no evidence of infection.  At this point patient has no evidence of any significant urinary retention or infectious process and I think he is appropriate for close outpatient urology follow-up given he is able to objectively urinate on his own here in the emergency room.  Discussed returning to the  emergency room for any new or worsening of symptoms.  Discharged in stable condition.  Strict return precautions advised and discussed.       FINAL CLINICAL IMPRESSION(S) / ED DIAGNOSES   Final diagnoses:  Urinary hesitancy     Rx / DC Orders   ED Discharge Orders     None        Note:  This document was prepared using Dragon voice recognition software and may include unintentional dictation errors.   Gilles Chiquito, MD 12/04/21 [phone]

## 2021-12-04 NOTE — ED Notes (Signed)
28 ml on bladder scan

## 2021-12-04 NOTE — ED Triage Notes (Signed)
Pt states that he has been unable to urinate for 8 hours- pt feels like his bladder is full and he cannot push it out- pt states he has had a hx of prostate problems

## 2021-12-06 ENCOUNTER — Telehealth: Payer: Self-pay

## 2021-12-06 ENCOUNTER — Ambulatory Visit: Payer: Medicaid Other | Admitting: Gastroenterology

## 2021-12-06 ENCOUNTER — Encounter: Payer: Self-pay | Admitting: Gastroenterology

## 2021-12-06 ENCOUNTER — Other Ambulatory Visit: Payer: Self-pay

## 2021-12-06 VITALS — BP 122/78 | HR 109 | Temp 98.3°F | Ht 71.0 in | Wt 282.0 lb

## 2021-12-06 DIAGNOSIS — K219 Gastro-esophageal reflux disease without esophagitis: Secondary | ICD-10-CM

## 2021-12-06 NOTE — Patient Outreach (Signed)
Care Coordination  12/06/2021  Ruben Mahler [facility] 04-May-1966 [id number]  Transition Care Management Follow-up Telephone Call Date of discharge and from where: 12/04/21 [facility] How have you been since you were released from the hospital? Good staying on top of things Any questions or concerns? No  Items Reviewed: Did the pt receive and understand the discharge instructions provided? Yes  Medications obtained and verified? No  Other? No  Any new allergies since your discharge? No  Dietary orders reviewed? No Do you have support at home? Yes   Home Care and Equipment/Supplies: Were home health services ordered? not applicable If so, what is the name of the agency?  Has the agency set up a time to come to the patient's home? not applicable Were any new equipment or medical supplies ordered?  No What is the name of the medical supply agency?  Were you able to get the supplies/equipment? not applicable Do you have any questions related to the use of the equipment or supplies? No  Functional Questionnaire: (I = Independent and D = Dependent) ADLs: I  Bathing/Dressing- I  Meal Prep- I  Eating- I  Maintaining continence- I  Transferring/Ambulation- I  Managing Meds- I  Follow up appointments reviewed:  PCP Hospital f/u appt confirmed? Yes  Scheduled to see Dr. Richardo Hanks on 12/07/21 @ 1:00pm. Specialist Hospital f/u appt confirmed? Yes  Scheduled to see  Are transportation arrangements needed? No  If their condition worsens, is the pt aware to call PCP or go to the Emergency Dept.? Yes Was the patient provided with contact information for the PCP's office or ED? Yes Was to pt encouraged to call back with questions or concerns? Yes

## 2021-12-06 NOTE — Patient Instructions (Addendum)
We will call you in 6 months to make sure that we follow up with you.  [place]    Food Choices for Gastroesophageal Reflux Disease, Adult When you have gastroesophageal reflux disease (GERD), the foods you eat and your eating habits are very important. Choosing the right foods can help ease your discomfort. Think about working with a food expert (dietitian) to help you make good choices. What are tips for following this plan? Reading food labels Look for foods that are low in saturated fat. Foods that may help with your symptoms include: Foods that have less than 5% of daily value (DV) of fat. Foods that have 0 grams of trans fat. Cooking Do not fry your food. Cook your food by baking, steaming, grilling, or broiling. These are all methods that do not need a lot of fat for cooking. To add flavor, try to use herbs that are low in spice and acidity. Meal planning  Choose healthy foods that are low in fat, such as: Fruits and vegetables. Whole grains. Low-fat dairy products. Lean meats, fish, and poultry. Eat small meals often instead of eating 3 large meals each day. Eat your meals slowly in a place where you are relaxed. Avoid bending over or lying down until 2-3 hours after eating. Limit high-fat foods such as fatty meats or fried foods. Limit your intake of fatty foods, such as oils, butter, and shortening. Avoid the following as told by your doctor: Foods that cause symptoms. These may be different for different people. Keep a food diary to keep track of foods that cause symptoms. Alcohol. Drinking a lot of liquid with meals. Eating meals during the 2-3 hours before bed. Lifestyle Stay at a healthy weight. Ask your doctor what weight is healthy for you. If you need to lose weight, work with your doctor to do so safely. Exercise for at least 30 minutes on 5 or more days each week, or as told by your doctor. Wear loose-fitting clothes. Do not smoke or use any products that  contain nicotine or tobacco. If you need help quitting, ask your doctor. Sleep with the head of your bed higher than your feet. Use a wedge under the mattress or blocks under the bed frame to raise the head of the bed. Chew sugar-free gum after meals. What foods should eat?  Eat a healthy, well-balanced diet of fruits, vegetables, whole grains, low-fat dairy products, lean meats, fish, and poultry. Each person is different. Foods that may cause symptoms in one person may not cause any symptoms in another person. Work with your doctor to find foods that are safe for you. The items listed above may not be a complete list of what you can eat and drink. Contact a food expert for more options. What foods should I avoid? Limiting some of these foods may help in managing the symptoms of GERD. Everyone is different. Talk with a food expert or your doctor to help you find the exact foods to avoid, if any. Fruits Any fruits prepared with added fat. Any fruits that cause symptoms. For some people, this may include citrus fruits, such as oranges, grapefruit, pineapple, and lemons. Vegetables Deep-fried vegetables. [place] fries. Any vegetables prepared with added fat. Any vegetables that cause symptoms. For some people, this may include tomatoes and tomato products, chili peppers, onions and garlic, and horseradish. Grains Pastries or quick breads with added fat. Meats and other proteins High-fat meats, such as fatty beef or pork, hot dogs, ribs, ham, sausage,  salami, and bacon. Fried meat or protein, including fried fish and fried chicken. Nuts and nut butters, in large amounts. Dairy Whole milk and chocolate milk. Sour cream. Cream. Ice cream. Cream cheese. Milkshakes. Fats and oils Butter. Margarine. Shortening. Ghee. Beverages Coffee and tea, with or without caffeine. Carbonated beverages. Sodas. Energy drinks. Fruit juice made with acidic fruits, such as orange or grapefruit. Tomato juice. Alcoholic  drinks. Sweets and desserts Chocolate and cocoa. Donuts. Seasonings and condiments Pepper. Peppermint and spearmint. Added salt. Any condiments, herbs, or seasonings that cause symptoms. For some people, this may include curry, hot sauce, or vinegar-based salad dressings. The items listed above may not be a complete list of what you should not eat and drink. Contact a food expert for more options. Questions to ask your doctor Diet and lifestyle changes are often the first steps that are taken to manage symptoms of GERD. If diet and lifestyle changes do not help, talk with your doctor about taking medicines. Where to find more information International Foundation for Gastrointestinal Disorders: aboutgerd.org Summary When you have GERD, food and lifestyle choices are very important in easing your symptoms. Eat small meals often instead of 3 large meals a day. Eat your meals slowly and in a place where you are relaxed. Avoid bending over or lying down until 2-3 hours after eating. Limit high-fat foods such as fatty meats or fried foods. This information is not intended to replace advice given to you by your health care provider. Make sure you discuss any questions you have with your health care provider. Document Revised: 12/16/2019 Document Reviewed: 12/16/2019 Elsevier Patient Education  2023 [employer].

## 2021-12-06 NOTE — Progress Notes (Signed)
Tom Mood MD, MRCP(U.K) [address]  [address]  [place], [place] [address]  Main: [phone]  Fax: [phone]   Gastroenterology Consultation  Referring Provider:     Saralyn Pilar * Primary Care Physician:  [name], DO Primary Gastroenterologist:  Dr. Wyline [name]  Reason for Consultation:     GERD        HPI:   Tom [name] is a 56 y.o. y/o male referred for consultation & management  by Dr. Althea Charon, [name], DO.     Reflux: Over 5 years probably not more than 10  Symptoms: Heartburn Recent weight gain: Gained some weight about 50 pounds but has since lost about 20 intentionally Medications: Omeprazole 40 mg and famotidine 20 mg has been taking it both in the morning after his breakfast Narcotics or anticholinergics use : None PPI /H2 blockers or Antacid  use and timing : As above No difficulty swallowing no chest pain.  Recent visit to the ER when he presented with shortness of breath and was treated for reflux.  It occurred right after he went to sleep and he laid flat.  Doing well now on PPI and famotidine with no symptoms.  GERD symptoms up to few times a month  Past Medical History:  Diagnosis Date   Arthritis    COPD (chronic obstructive pulmonary disease) (HCC)    Hyperlipidemia    Hypertension    Osteoporosis     Past Surgical History:  Procedure Laterality Date   ANKLE FUSION Right 2017   BACK SURGERY     TOTAL KNEE ARTHROPLASTY Right 04/2012    Prior to Admission medications   Medication Sig Start Date End Date Taking? Authorizing Provider  albuterol (PROVENTIL) (2.5 MG/3ML) 0.083% nebulizer solution Take 3 mLs (2.5 mg total) by nebulization every 4 (four) hours as needed for wheezing or shortness of breath. 05/25/21   Nyoka Cowden, MD  albuterol (VENTOLIN HFA) 108 (90 Base) MCG/ACT inhaler Inhale 2 puffs into the lungs every 4 (four) hours as needed for wheezing or shortness of breath. 11/26/21    Nyoka Cowden, MD  atorvastatin (LIPITOR) 40 MG tablet Take 1 tablet (40 mg total) by mouth daily. 01/27/21   [name], [name], DO  budesonide-formoterol (SYMBICORT) 80-4.5 MCG/ACT inhaler Take 2 puffs first thing in am and then another 2 puffs about 12 hours later. 07/09/21   Nyoka Cowden, MD  buPROPion (WELLBUTRIN XL) 150 MG 24 hr tablet bupropion HCl XL 150 mg 24 hr tablet, extended release  TAKE 1 TABLET (150 MG TOTAL) BY MOUTH IN THE MORNING    [provider]  chlorthalidone (HYGROTON) 25 MG tablet Take 1 tablet (25 mg total) by mouth daily. 01/27/21 01/27/22  [name], [name], DO  doxazosin (CARDURA) 2 MG tablet Take 1 tablet (2 mg total) by mouth daily. 11/02/21   [name], [name], DO  DULoxetine (CYMBALTA) 20 MG capsule Take 2 capsules by mouth daily.    [provider]  famotidine (PEPCID) 20 MG tablet One after supper 11/26/21   Nyoka Cowden, MD  furosemide (LASIX) 40 MG tablet Take 1 tablet (40 mg total) by mouth daily for 7 days. 10/27/21 11/03/21  [name], MD  losartan (COZAAR) 25 MG tablet Take 1 tablet (25 mg total) by mouth daily. 01/27/21   [name], [name], DO  meloxicam (MOBIC) 15 MG tablet Take 15 mg by mouth daily. 11/21/21   [provider]  Multiple Vitamin (MULTIVITAMIN)  capsule Take 1 capsule by mouth daily.    [provider]  pantoprazole (PROTONIX) 40 MG tablet Take 1 tablet (40 mg total) by mouth daily. Take 30-60 min before first meal of the day 11/26/21   Nyoka Cowden, MD  potassium chloride SA (KLOR-CON) 20 MEQ tablet Take 1 tablet (20 mEq total) by mouth daily. 01/27/21   [name], [name], DO  tiZANidine (ZANAFLEX) 4 MG tablet Take 1 tablet (4 mg total) by mouth every 8 (eight) hours as needed for muscle spasms. 07/14/21   [name], [name], DO  traMADol (ULTRAM) 50 MG tablet Take 50 mg by mouth every 8 (eight) hours as needed. 10/12/21   [provider]    No family  history on file.   Social History   Tobacco Use   Smoking status: Former    Packs/day: 1.00    Years: 39.00    Total pack years: 39.00    Types: Cigarettes    Quit date: 12/2015    Years since quitting: 5.9   Smokeless tobacco: Never  Vaping Use   Vaping Use: Never used  Substance Use Topics   Alcohol use: No    Allergies as of 12/06/2021 - Review Complete 12/04/2021  Allergen Reaction Noted   Amlodipine Nausea And Vomiting 12/16/2020   Bee venom Swelling 12/25/2014   Cymbalta [duloxetine hcl] Other (See Comments) 04/29/2021   Duloxetine Other (See Comments) 04/29/2021   Lisinopril  10/07/2019    Review of Systems:    All systems reviewed and negative except where noted in HPI.   Physical Exam:  There were no vitals taken for this visit. No LMP for male patient. Psych:  Alert and cooperative. Normal [name] and affect. General:   Alert,  Well-developed, well-nourished, pleasant and cooperative in NAD Head:  Normocephalic and atraumatic. Eyes:  Sclera clear, no icterus.   Conjunctiva pink.   Neurologic:  Alert and oriented x3;  grossly normal neurologically. Psych:  Alert and cooperative. Normal [name] and affect.  Imaging Studies: DG Chest 2 View  Result Date: 12/02/2021 CLINICAL DATA:  Shortness of breath.  Acid reflux. EXAM: CHEST - 2 VIEW COMPARISON:  10/27/2021 FINDINGS: Heart size is normal. Multiple calcified granulomas scattered throughout both lungs appear the same as were seen previously. No evidence of consolidation, collapse or effusion. Old healed rib fractures on the left. IMPRESSION: No active cardiopulmonary disease. Multiple calcified granulomas as seen previously. Electronically Signed   By: Paulina Fusi M.D.   On: 12/02/2021 12:57    Assessment and Plan:   Tom [name] is a 56 y.o. y/o male has been referred for GERD : Counseled on life style changes, suggest to use PPI first thing in the morning on empty stomach and eat 30 minutes after. Advised  on the use of a wedge pillow at night , avoid meals for 2 hours prior to bed time. Weight loss .Discussed the risks and benefits of long term PPI use including but not limited to bone loss, chronic kidney disease, infections , low magnesium . Aim to use at the lowest dose for the shortest period of time.  He has not had reflux for more than 5 to 10 years.  Hence lower risk for Barrett's esophagus.  We will discuss further at next visit.  We will also plan to reduce the dose of PPI at next visit if he is doing well.  He has a bed with head raising abilities   Follow up in 6 months  Dr Jonathon Bellows MD,MRCP(U.K)

## 2021-12-07 ENCOUNTER — Other Ambulatory Visit: Payer: Self-pay | Admitting: *Deleted

## 2021-12-07 ENCOUNTER — Ambulatory Visit: Admission: RE | Admit: 2021-12-07 | Payer: Medicaid Other | Source: Ambulatory Visit

## 2021-12-07 ENCOUNTER — Ambulatory Visit (INDEPENDENT_AMBULATORY_CARE_PROVIDER_SITE_OTHER): Payer: Medicaid Other | Admitting: Urology

## 2021-12-07 ENCOUNTER — Encounter: Payer: Self-pay | Admitting: Urology

## 2021-12-07 ENCOUNTER — Other Ambulatory Visit
Admission: RE | Admit: 2021-12-07 | Discharge: 2021-12-07 | Disposition: A | Discharge status: Home / Self Care | Payer: Medicaid Other | Attending: Urology | Admitting: Urology

## 2021-12-07 VITALS — BP 90/44 | HR 83 | Ht 71.0 in | Wt 279.4 lb

## 2021-12-07 DIAGNOSIS — Z125 Encounter for screening for malignant neoplasm of prostate: Secondary | ICD-10-CM | POA: Diagnosis not present

## 2021-12-07 DIAGNOSIS — R3915 Urgency of urination: Secondary | ICD-10-CM

## 2021-12-07 DIAGNOSIS — R399 Unspecified symptoms and signs involving the genitourinary system: Secondary | ICD-10-CM | POA: Diagnosis not present

## 2021-12-07 LAB — URINALYSIS, COMPLETE (UACMP) WITH MICROSCOPIC
Bacteria, UA: NONE SEEN
Glucose, UA: NEGATIVE mg/dL
Hgb urine dipstick: NEGATIVE
Ketones, ur: NEGATIVE mg/dL
Leukocytes,Ua: NEGATIVE
Nitrite: NEGATIVE
Protein, ur: NEGATIVE mg/dL
Specific Gravity, Urine: 1.015 (ref 1.005–1.030)
Squamous Epithelial / HPF: NONE SEEN (ref 0–5)
pH: 6 (ref 5.0–8.0)

## 2021-12-07 LAB — BLADDER SCAN AMB NON-IMAGING

## 2021-12-07 MED ORDER — ALFUZOSIN HCL ER 10 MG PO TB24: 10.0000 mg | ORAL_TABLET | Freq: Every day | ORAL | 11 refills | Status: DC

## 2021-12-07 NOTE — Progress Notes (Signed)
12/07/21 3:31 PM   Doris Cheadle Milles 1966-02-16 [id number]  CC: Urinary symptoms, nocturia, PSA screening  HPI: 56 year old male who reports about 8 months of urinary symptoms.  He is a challenging historian.  It sounds like his main issue was urinary frequency and urgency, as well as overnight urination every 30 minutes, as well as some straining and weak stream.  He previously was consuming a significant amount of alcohol with 8-9 24 ounce cans of hard lemonade per day, but recently stopped drinking entirely.  He reports his urinary symptoms have improved since he cut back on his drinking, and he has not voiding as much.  He also was seen in the ER on 12/04/2021 with concern for urinary retention because he had not made urine in 7 to 8 hours.  Bladder scan at that time showed only 200 mL of urine, but secondary to his body habitus and concern for elevated PVR he underwent a straight catheterization which only drained 200 mL of benign urine with only some microscopic hematuria as expected with catheterization.  He does think his urination has been a little bit better since he had the in and out catheterization.  He denies any gross hematuria or dysuria.  He previously was trialed on Flomax by PCP with bothersome itching and discontinued that medication, but was transitioned to doxazosin.  He is not sure the doxazosin makes a significant improvement in the urination.  PSA in August 2022 was normal at 1.17.  Urinalysis today is completely benign, and PVR is normal at 21 mL.   PMH: Past Medical History:  Diagnosis Date   Arthritis    COPD (chronic obstructive pulmonary disease) (HCC)    Hyperlipidemia    Hypertension    Osteoporosis     Surgical History: Past Surgical History:  Procedure Laterality Date   ANKLE FUSION Right 2017   BACK SURGERY     TOTAL KNEE ARTHROPLASTY Right 04/2012   Family History: No family history on file.  Social History:  reports that he quit smoking  about 5 years ago. His smoking use included cigarettes. He has a 39.00 pack-year smoking history. He has never used smokeless tobacco. He reports that he does not drink alcohol. No history on file for drug use.  Physical Exam: BP (!) 90/44 (BP Location: Left Arm, Patient Position: Sitting, Cuff Size: Large)   Pulse 83   Ht 5\' 11"  (1.803 m)   Wt 279 lb 6.4 oz (126.7 kg)   BMI 38.97 kg/m    Constitutional:  Alert and oriented, No acute distress. Cardiovascular: No clubbing, cyanosis, or edema. Respiratory: Normal respiratory effort, no increased work of breathing. GI: Abdomen is soft, nontender, nondistended, no abdominal masses GU: Circumcised phallus with no lesions, patent meatus DRE: Patient refused  Laboratory Data: Reviewed, see HPI  Pertinent Imaging: I have personally viewed and interpreted the CT abdomen and pelvis with contrast from [facility] from April 2021 showing no hydronephrosis or stones, decompressed bladder, and prostate measuring 32 g.  Assessment & Plan:   56 year old male with 8 to 9 months of urinary symptoms including urgency, frequency, nocturia, and weak stream/straining.  He was previously drinking at least 200 ounces of alcohol per day which was likely contributing to his significant urinary urgency/frequency and nocturia, and sounds like symptoms have improved over the last few weeks since he has stopped consuming alcohol.  He also had a straight catheterization with only 200 mL in the bladder in the ER on 6/17 and feels like  he has been voiding better since that time.  We discussed possible etiologies including changes in his alcohol and fluid intake, BPH, urethral stricture, and other pathologies.  Urinalysis is benign and PVRs normal today which are both reassuring.  He is interested in trying a different BPH medication, and alfuzosin was prescribed.  I recommended considering cystoscopy to evaluate for stricture, but he would like to hold off on this time with any  procedures.  PSA normal, prostate volume normal on prior CT.  Return precautions were discussed at length.  -Trial of alfuzosin for urinary symptoms -RTC 3 months PVR and symptom check, sooner if problems -Consider cystoscopy in the future if persistent symptoms   Legrand Rams, MD 12/07/2021  [facility] [address] [place], [place] [address] [phone]

## 2021-12-08 ENCOUNTER — Telehealth: Payer: Self-pay

## 2021-12-08 NOTE — Telephone Encounter (Signed)
Incoming call from pt who states that after taking 1 dose of alfuzosin he noticed dizziness, and felt he had to take frequent sitting breaks. I advised pt to d/c the medication until provider advises on next steps. Pt voiced understanding. Please advise on next steps. Pt is unable to take Flomax.

## 2021-12-09 NOTE — Telephone Encounter (Signed)
Patient returned the call and expressed understanding.  He will call if symptoms change and we need to move his follow up appointment.

## 2021-12-16 ENCOUNTER — Telehealth: Payer: Self-pay | Admitting: Family Medicine

## 2021-12-16 DIAGNOSIS — Z91030 Bee allergy status: Secondary | ICD-10-CM

## 2021-12-16 MED ORDER — EPINEPHRINE 0.3 MG/0.3ML IJ SOAJ: 0.3000 mg | INTRAMUSCULAR | 1 refills | Status: AC | PRN

## 2021-12-16 NOTE — Telephone Encounter (Signed)
Patient states he has yellow jackets and hornets nest around his yard and in his shed, patient states he is severely allergic which provider is aware of and documented in his chart  Patient requesting a rx for Epipen as a precaution  Please advise and fu w/ patient  Pharmacy  [phone] [place] [place] - [address] Phone:  [phone]  Fax:  [phone]

## 2021-12-31 DIAGNOSIS — Z91030 Bee allergy status: Secondary | ICD-10-CM | POA: Diagnosis not present

## 2021-12-31 DIAGNOSIS — Z888 Allergy status to other drugs, medicaments and biological substances status: Secondary | ICD-10-CM | POA: Diagnosis not present

## 2021-12-31 DIAGNOSIS — G8929 Other chronic pain: Secondary | ICD-10-CM | POA: Diagnosis not present

## 2021-12-31 DIAGNOSIS — T84218A Breakdown (mechanical) of internal fixation device of other bones, initial encounter: Secondary | ICD-10-CM | POA: Diagnosis not present

## 2021-12-31 DIAGNOSIS — I2510 Atherosclerotic heart disease of native coronary artery without angina pectoris: Secondary | ICD-10-CM | POA: Diagnosis not present

## 2021-12-31 DIAGNOSIS — Z6839 Body mass index (BMI) 39.0-39.9, adult: Secondary | ICD-10-CM | POA: Diagnosis not present

## 2021-12-31 DIAGNOSIS — E785 Hyperlipidemia, unspecified: Secondary | ICD-10-CM | POA: Diagnosis not present

## 2021-12-31 DIAGNOSIS — G8918 Other acute postprocedural pain: Secondary | ICD-10-CM | POA: Diagnosis not present

## 2021-12-31 DIAGNOSIS — I10 Essential (primary) hypertension: Secondary | ICD-10-CM | POA: Diagnosis not present

## 2021-12-31 DIAGNOSIS — J449 Chronic obstructive pulmonary disease, unspecified: Secondary | ICD-10-CM | POA: Diagnosis not present

## 2021-12-31 DIAGNOSIS — K219 Gastro-esophageal reflux disease without esophagitis: Secondary | ICD-10-CM | POA: Diagnosis not present

## 2021-12-31 DIAGNOSIS — M960 Pseudarthrosis after fusion or arthrodesis: Secondary | ICD-10-CM | POA: Diagnosis not present

## 2021-12-31 DIAGNOSIS — M1990 Unspecified osteoarthritis, unspecified site: Secondary | ICD-10-CM | POA: Diagnosis not present

## 2021-12-31 DIAGNOSIS — M25551 Pain in right hip: Secondary | ICD-10-CM | POA: Diagnosis not present

## 2021-12-31 DIAGNOSIS — Z87891 Personal history of nicotine dependence: Secondary | ICD-10-CM | POA: Diagnosis not present

## 2022-01-01 DIAGNOSIS — Z981 Arthrodesis status: Secondary | ICD-10-CM | POA: Diagnosis not present

## 2022-01-01 DIAGNOSIS — G8918 Other acute postprocedural pain: Secondary | ICD-10-CM | POA: Diagnosis not present

## 2022-01-10 DIAGNOSIS — Z981 Arthrodesis status: Secondary | ICD-10-CM | POA: Diagnosis not present

## 2022-01-17 DIAGNOSIS — M7981 Nontraumatic hematoma of soft tissue: Secondary | ICD-10-CM | POA: Diagnosis not present

## 2022-01-17 DIAGNOSIS — Z91030 Bee allergy status: Secondary | ICD-10-CM | POA: Diagnosis not present

## 2022-01-17 DIAGNOSIS — Z7982 Long term (current) use of aspirin: Secondary | ICD-10-CM | POA: Diagnosis not present

## 2022-01-17 DIAGNOSIS — I2510 Atherosclerotic heart disease of native coronary artery without angina pectoris: Secondary | ICD-10-CM | POA: Diagnosis not present

## 2022-01-17 DIAGNOSIS — Z79899 Other long term (current) drug therapy: Secondary | ICD-10-CM | POA: Diagnosis not present

## 2022-01-17 DIAGNOSIS — J449 Chronic obstructive pulmonary disease, unspecified: Secondary | ICD-10-CM | POA: Diagnosis not present

## 2022-01-17 DIAGNOSIS — Z87891 Personal history of nicotine dependence: Secondary | ICD-10-CM | POA: Diagnosis not present

## 2022-01-17 DIAGNOSIS — G8918 Other acute postprocedural pain: Secondary | ICD-10-CM | POA: Diagnosis not present

## 2022-01-17 DIAGNOSIS — Z472 Encounter for removal of internal fixation device: Secondary | ICD-10-CM | POA: Diagnosis not present

## 2022-01-17 DIAGNOSIS — Z888 Allergy status to other drugs, medicaments and biological substances status: Secondary | ICD-10-CM | POA: Diagnosis not present

## 2022-01-17 DIAGNOSIS — I10 Essential (primary) hypertension: Secondary | ICD-10-CM | POA: Diagnosis not present

## 2022-01-17 DIAGNOSIS — M1990 Unspecified osteoarthritis, unspecified site: Secondary | ICD-10-CM | POA: Diagnosis not present

## 2022-01-17 DIAGNOSIS — M25571 Pain in right ankle and joints of right foot: Secondary | ICD-10-CM | POA: Diagnosis not present

## 2022-01-19 DIAGNOSIS — R600 Localized edema: Secondary | ICD-10-CM | POA: Diagnosis not present

## 2022-01-20 DIAGNOSIS — M960 Pseudarthrosis after fusion or arthrodesis: Secondary | ICD-10-CM | POA: Diagnosis not present

## 2022-01-20 DIAGNOSIS — Z981 Arthrodesis status: Secondary | ICD-10-CM | POA: Diagnosis not present

## 2022-01-20 DIAGNOSIS — M19071 Primary osteoarthritis, right ankle and foot: Secondary | ICD-10-CM | POA: Diagnosis not present

## 2022-01-26 ENCOUNTER — Telehealth: Payer: Self-pay

## 2022-01-26 NOTE — Patient Instructions (Signed)
Visit Information  [name]  - as a part of your Medicaid benefit, you are eligible for care management and care coordination services at no cost or copay. I was unable to reach you by phone today but would be happy to help you with your health related needs. Please feel free to call me @ [phone]).   A member of the Managed Medicaid care management team will reach out to you again over the next 7 days.   Gus Puma, BSW, [facility]  [place]  High Risk Managed Medicaid Team  [phone]

## 2022-01-26 NOTE — Patient Outreach (Signed)
Care Coordination  01/26/2022  Tom [name] Sep 02, 1965 [id number]   Medicaid Managed Care   Unsuccessful Outreach Note  01/26/2022 Name: Tom [name] MRN: [id number] DOB: [birth year]  Referred by: [name], DO Reason for referral : High Risk Managed Medicaid (MM social work unsuccessful telephone outreach)   An unsuccessful telephone outreach was attempted today. The patient was referred to the case management team for assistance with care management and care coordination.   Follow Up Plan: The care management team will reach out to the patient again over the next 7 days.   Tom [name], BSW, [facility]  [place]  High Risk Managed Medicaid Team  [phone]

## 2022-02-07 DIAGNOSIS — Z4789 Encounter for other orthopedic aftercare: Secondary | ICD-10-CM | POA: Diagnosis not present

## 2022-02-07 DIAGNOSIS — M19071 Primary osteoarthritis, right ankle and foot: Secondary | ICD-10-CM | POA: Diagnosis not present

## 2022-02-07 IMAGING — US US EXTREM LOW VENOUS
1 series · 13 of 24 positions shown · non-contrast
Comparison: None.

CLINICAL DATA: Bilateral lower extremity edema. Elevated D-dimer.
Evaluate for DVT.



[Series 1: us venous img lower bilat (dvt) · portal-venous · 13 of 62 slices shown]
[im 1/62]
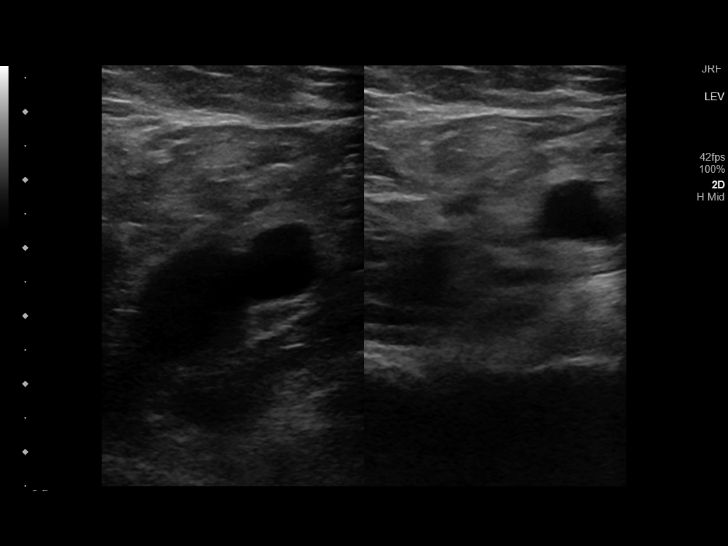
[im 6/62]
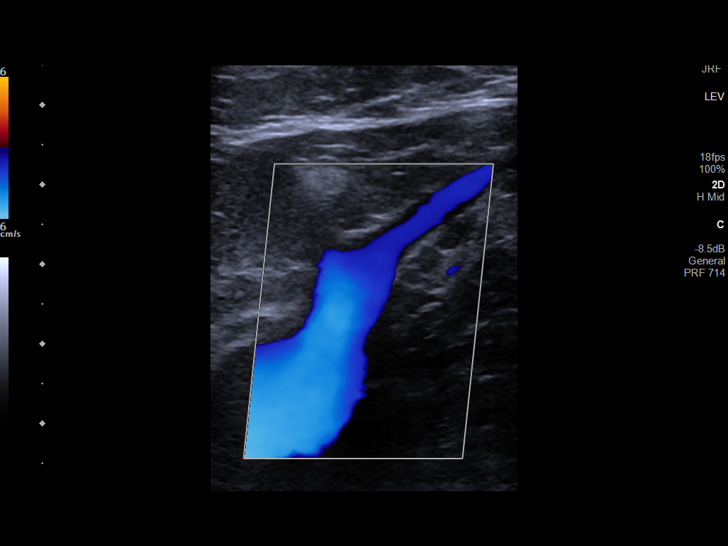
[im 11/62]
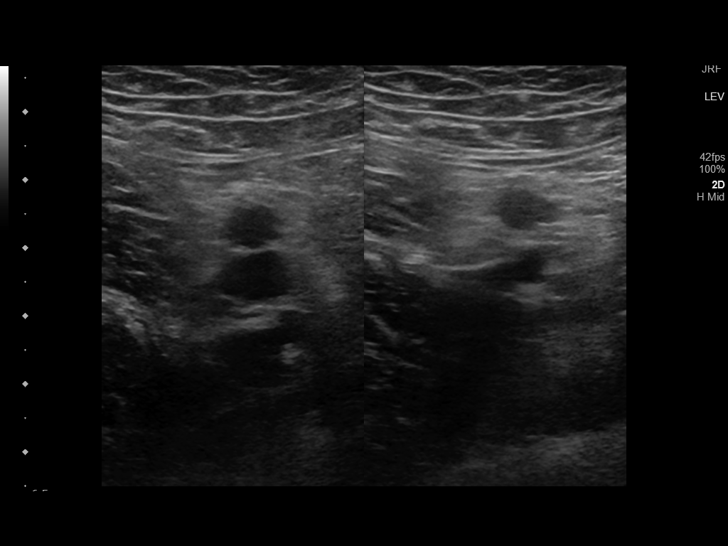
[im 16/62]
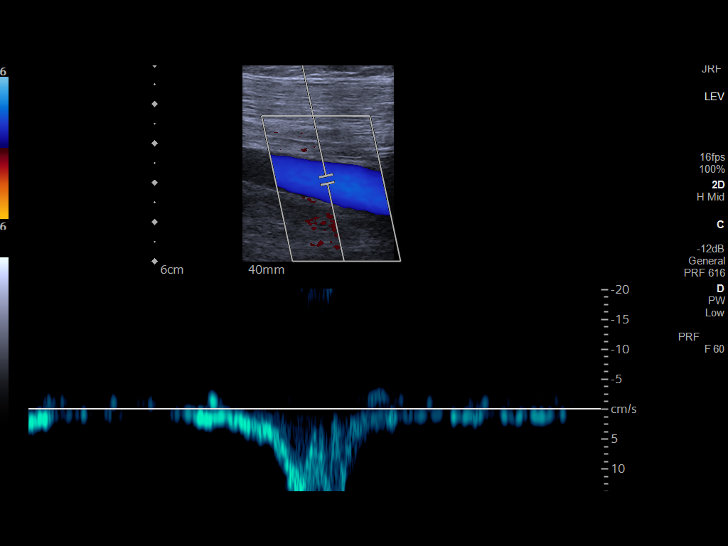
[im 22/62]
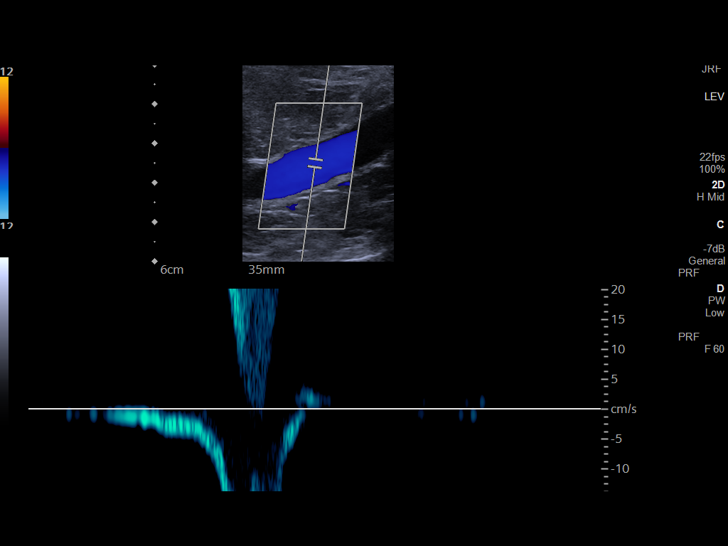
[im 27/62]
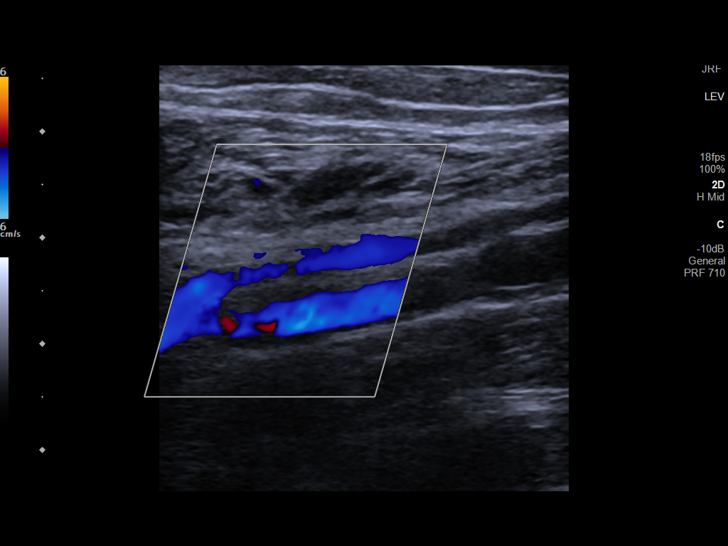
[im 32/62]
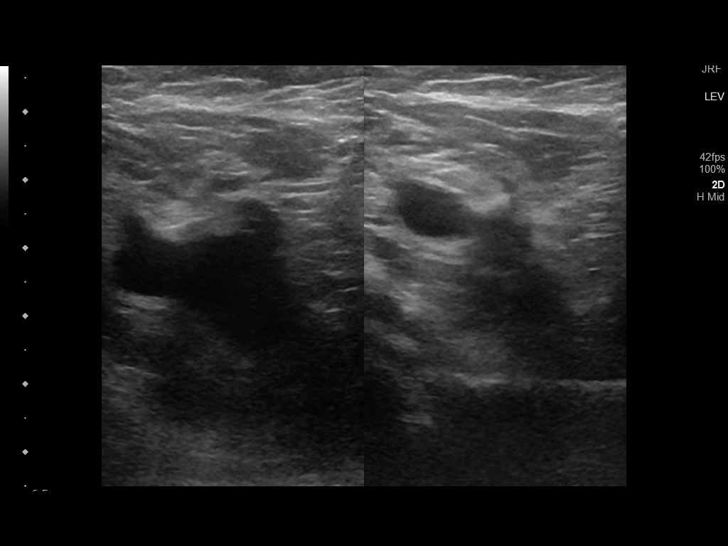
[im 35/62]
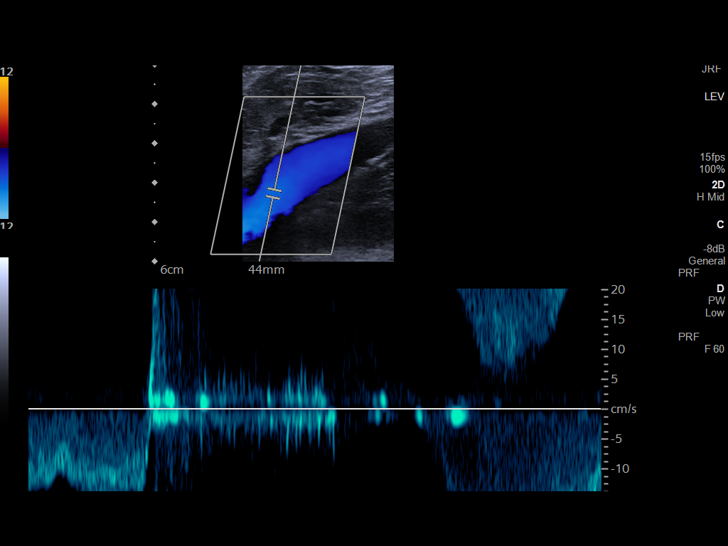
[im 40/62]
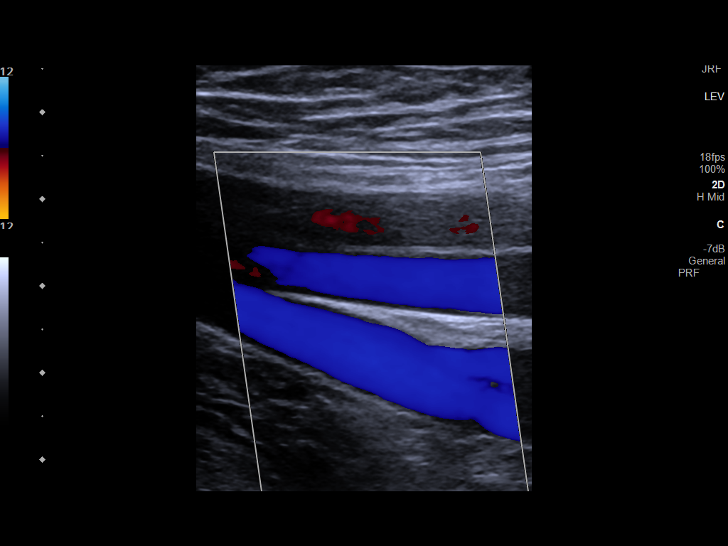
[im 46/62]
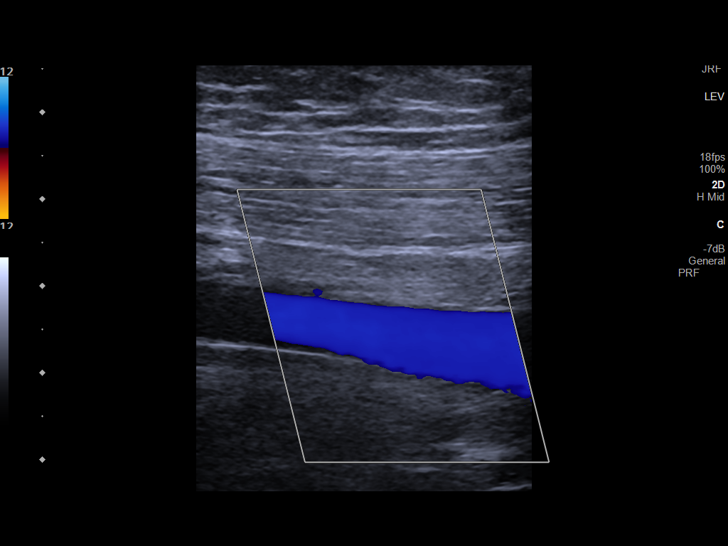
[im 51/62]
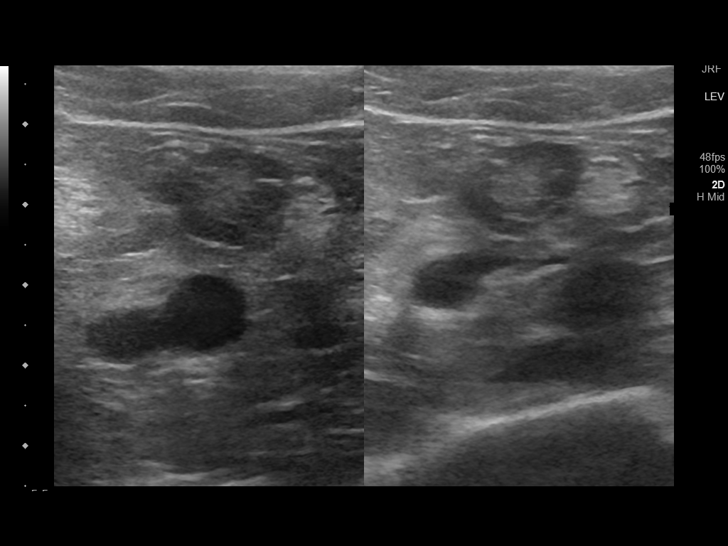
[im 56/62]
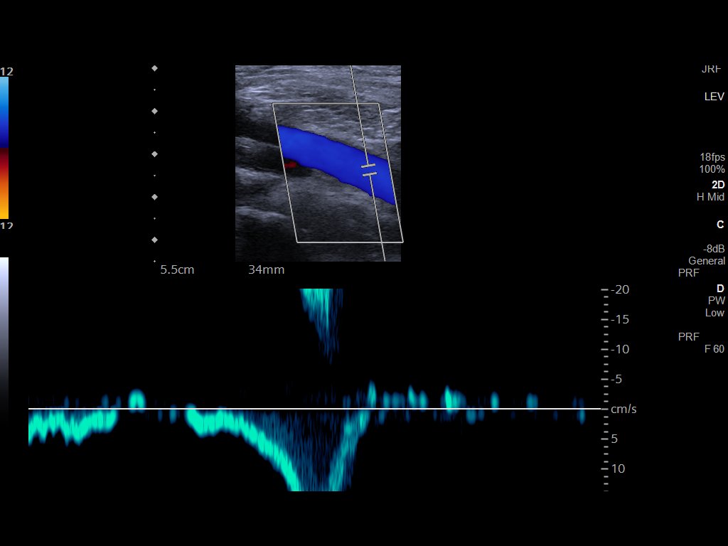
[im 62/62]
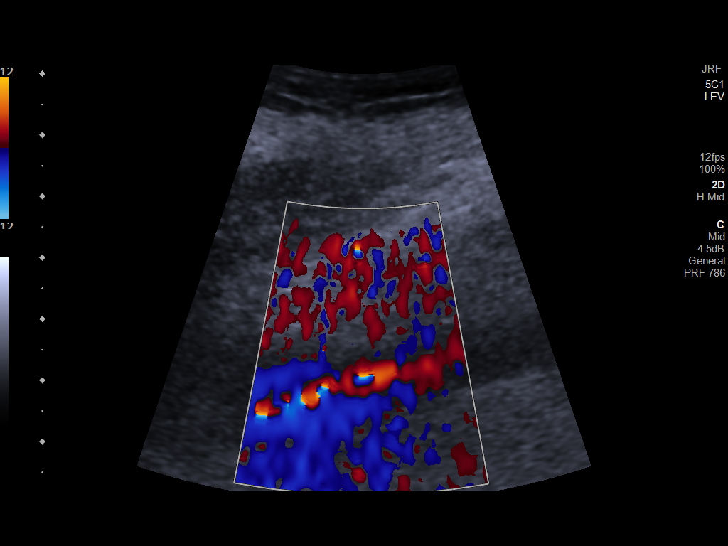

[13 of 24 positions shown; findings below may reference images not displayed]

FINDINGS: RIGHT LOWER EXTREMITY

Common Femoral Vein: No evidence of thrombus. Normal
compressibility, respiratory phasicity and response to augmentation.

Saphenofemoral Junction: No evidence of thrombus. Normal
compressibility and flow on color Doppler imaging.

Profunda Femoral Vein: No evidence of thrombus. Normal
compressibility and flow on color Doppler imaging.

Femoral Vein: No evidence of thrombus. Normal compressibility,
respiratory phasicity and response to augmentation.

Popliteal Vein: No evidence of thrombus. Normal compressibility,
respiratory phasicity and response to augmentation.

Calf Veins: No evidence of thrombus. Normal compressibility and flow
on color Doppler imaging.

Superficial Great Saphenous Vein: No evidence of thrombus. Normal
compressibility.

Venous Reflux:  None.

Other Findings:  None.

LEFT LOWER EXTREMITY

Common Femoral Vein: No evidence of thrombus. Normal
compressibility, respiratory phasicity and response to augmentation.

Saphenofemoral Junction: No evidence of thrombus. Normal
compressibility and flow on color Doppler imaging.

Profunda Femoral Vein: No evidence of thrombus. Normal
compressibility and flow on color Doppler imaging.

Femoral Vein: No evidence of thrombus. Normal compressibility,
respiratory phasicity and response to augmentation.

Popliteal Vein: No evidence of thrombus. Normal compressibility,
respiratory phasicity and response to augmentation.

Calf Veins: No evidence of thrombus. Normal compressibility and flow
on color Doppler imaging.

Superficial Great Saphenous Vein: No evidence of thrombus. Normal
compressibility.

Venous Reflux:  None.

Other Findings:  None.
IMPRESSION: No evidence of DVT within either lower extremity.

## 2022-02-08 ENCOUNTER — Ambulatory Visit: Payer: Medicaid Other | Admitting: Urology

## 2022-02-10 ENCOUNTER — Other Ambulatory Visit: Payer: Self-pay | Admitting: Internal Medicine

## 2022-02-10 DIAGNOSIS — J432 Centrilobular emphysema: Secondary | ICD-10-CM

## 2022-02-11 ENCOUNTER — Ambulatory Visit: Payer: Medicaid Other | Admitting: Internal Medicine

## 2022-02-15 ENCOUNTER — Other Ambulatory Visit (HOSPITAL_COMMUNITY): Payer: Self-pay

## 2022-03-07 DIAGNOSIS — M7731 Calcaneal spur, right foot: Secondary | ICD-10-CM | POA: Diagnosis not present

## 2022-03-15 ENCOUNTER — Other Ambulatory Visit (HOSPITAL_COMMUNITY): Payer: Self-pay

## 2022-03-21 ENCOUNTER — Encounter: Payer: Self-pay | Admitting: Family Medicine

## 2022-03-21 ENCOUNTER — Other Ambulatory Visit: Payer: Self-pay | Admitting: Family Medicine

## 2022-03-21 ENCOUNTER — Ambulatory Visit (INDEPENDENT_AMBULATORY_CARE_PROVIDER_SITE_OTHER): Payer: Medicaid Other | Admitting: Family Medicine

## 2022-03-21 VITALS — BP 134/73 | HR 98 | Ht 71.0 in | Wt 300.0 lb

## 2022-03-21 DIAGNOSIS — M5136 Other intervertebral disc degeneration, lumbar region: Secondary | ICD-10-CM | POA: Diagnosis not present

## 2022-03-21 DIAGNOSIS — M545 Low back pain, unspecified: Secondary | ICD-10-CM | POA: Diagnosis not present

## 2022-03-21 DIAGNOSIS — R7303 Prediabetes: Secondary | ICD-10-CM

## 2022-03-21 DIAGNOSIS — J432 Centrilobular emphysema: Secondary | ICD-10-CM | POA: Diagnosis not present

## 2022-03-21 DIAGNOSIS — I10 Essential (primary) hypertension: Secondary | ICD-10-CM

## 2022-03-21 DIAGNOSIS — I2510 Atherosclerotic heart disease of native coronary artery without angina pectoris: Secondary | ICD-10-CM | POA: Diagnosis not present

## 2022-03-21 DIAGNOSIS — G8929 Other chronic pain: Secondary | ICD-10-CM | POA: Diagnosis not present

## 2022-03-21 DIAGNOSIS — K219 Gastro-esophageal reflux disease without esophagitis: Secondary | ICD-10-CM | POA: Diagnosis not present

## 2022-03-21 DIAGNOSIS — Z0000 Encounter for general adult medical examination without abnormal findings: Secondary | ICD-10-CM

## 2022-03-21 DIAGNOSIS — R0609 Other forms of dyspnea: Secondary | ICD-10-CM | POA: Diagnosis not present

## 2022-03-21 DIAGNOSIS — M51369 Other intervertebral disc degeneration, lumbar region without mention of lumbar back pain or lower extremity pain: Secondary | ICD-10-CM

## 2022-03-21 DIAGNOSIS — N401 Enlarged prostate with lower urinary tract symptoms: Secondary | ICD-10-CM

## 2022-03-21 MED ORDER — ALBUTEROL SULFATE HFA 108 (90 BASE) MCG/ACT IN AERS: INHALATION_SPRAY | RESPIRATORY_TRACT | 3 refills | Status: DC

## 2022-03-21 MED ORDER — TRAMADOL HCL 50 MG PO TABS: 50.0000 mg | ORAL_TABLET | Freq: Three times a day (TID) | ORAL | 1 refills | Status: DC | PRN

## 2022-03-21 MED ORDER — PANTOPRAZOLE SODIUM 40 MG PO TBEC: 40.0000 mg | DELAYED_RELEASE_TABLET | Freq: Every day | ORAL | 3 refills | Status: DC

## 2022-03-21 MED ORDER — ATORVASTATIN CALCIUM 40 MG PO TABS: 40.0000 mg | ORAL_TABLET | Freq: Every day | ORAL | 3 refills | Status: DC

## 2022-03-21 MED ORDER — TIZANIDINE HCL 4 MG PO TABS: 4.0000 mg | ORAL_TABLET | Freq: Three times a day (TID) | ORAL | 3 refills | Status: DC | PRN

## 2022-03-21 MED ORDER — LOSARTAN POTASSIUM 25 MG PO TABS: 25.0000 mg | ORAL_TABLET | Freq: Every day | ORAL | 3 refills | Status: DC

## 2022-03-21 MED ORDER — FAMOTIDINE 20 MG PO TABS
20.0000 mg | ORAL_TABLET | Freq: Every day | ORAL | 3 refills | Status: DC
Start: 2022-03-21 — End: 2023-06-02

## 2022-03-21 MED ORDER — BUDESONIDE-FORMOTEROL FUMARATE 80-4.5 MCG/ACT IN AERO: INHALATION_SPRAY | RESPIRATORY_TRACT | 12 refills | Status: DC

## 2022-03-21 MED ORDER — POTASSIUM CHLORIDE CRYS ER 20 MEQ PO TBCR
20.0000 meq | EXTENDED_RELEASE_TABLET | Freq: Every day | ORAL | 3 refills | Status: DC
Start: 2022-03-21 — End: 2022-06-01

## 2022-03-21 NOTE — Assessment & Plan Note (Signed)
Re order Tramadol PRN

## 2022-03-21 NOTE — Assessment & Plan Note (Signed)
Well-controlled HTN No known complications    Plan:  1.  Continue current BP regimen Losartan 25mg  and Chlorthalidone 25mg  2. Encourage improved lifestyle - low sodium diet, regular exercise 3. Start monitor BP outside office, bring readings to next visit, if persistently >140/90 or new symptoms notify office sooner

## 2022-03-21 NOTE — Assessment & Plan Note (Signed)
On imaging Continue statin

## 2022-03-21 NOTE — Patient Instructions (Addendum)
Thank you for coming to the office today.  DUE for FASTING BLOOD WORK (no food or drink after midnight before the lab appointment, only water or coffee without cream/sugar on the morning of)  SCHEDULE "Lab Only" visit in the morning at the clinic for lab draw in 6 MONTHS   - Make sure Lab Only appointment is at about 1 week before your next appointment, so that results will be available  For Lab Results, once available within 2-3 days of blood draw, you can can log in to MyChart online to view your results and a brief explanation. Also, we can discuss results at next follow-up visit.   Please schedule a Follow-up Appointment to: Return in about 6 months (around 09/20/2022) for 6 month fasting lab only then 1 week later Annual Physical.  If you have any other questions or concerns, please feel free to call the office or send a message through [place]. You may also schedule an earlier appointment if necessary.  Additionally, you may be receiving a survey about your experience at our office within a few days to 1 week by e-mail or mail. We value your feedback.  Nobie Putnam, DO [place]

## 2022-03-21 NOTE — Progress Notes (Signed)
Subjective:    Patient ID: Tom [name], male    DOB: 23-Jun-1965, 56 y.o.   MRN: [id number]  Tom [name] is a 56 y.o. male presenting on 03/21/2022 for Hypertension, Hyperlipidemia, Back Pain, and Gastroesophageal Reflux   HPI  Obesity BMI >37 PreDM Last lab A1c 5.7 Goal for appetite suppression. Last visit started Wellbutrin causing nausea vomiting. He is interested in other medicines. But he continues to work on lifestyle diet as well.  Chronic bilateral low back pain DDD Thoracic/Lumbar Spine History of disc herniation Recent history last treated by me 04/2021 for same issue, we discussed management with NSAID, Muscle relaxant Tizanidine PRN and rx Oxycodone temporary one time order, ans referral to Neurosurgery spine specialist. Neurosurgery has seen him already in January 2023 - Off Oxycodone - Request re order pain med Tramadol PRN - Request re order muscle relaxant Tizanidine   HYPERLIPIDEMIA: - Reports o concerns. Last lipid panel 1 yr ago - Currently taking Atorvastatin 40mg , tolerating well without side effects or myalgias  GERD Famotidine and PPI therapy Pantoprazole, needs both ordered  Health Maintenance: Declines vaccines     03/21/2022    2:56 PM 12/16/2020    3:16 PM  Depression screen PHQ 2/9  Decreased Interest 0 0  Down, Depressed, Hopeless 0 0  PHQ - 2 Score 0 0  Altered sleeping 0 0  Tired, decreased energy 0 0  Change in appetite 0 0  Feeling bad or failure about yourself  0 0  Trouble concentrating 0 0  Moving slowly or fidgety/restless 0 0  Suicidal thoughts 0 0  PHQ-9 Score 0 0  Difficult doing work/chores Not difficult at all Not difficult at all    Social History   Tobacco Use   Smoking status: Former    Packs/day: 1.00    Years: 39.00    Total pack years: 39.00    Types: Cigarettes    Quit date: 12/2015    Years since quitting: 6.2   Smokeless tobacco: Never  Vaping Use   Vaping Use: Never used  Substance  Use Topics   Alcohol use: No    Review of Systems Per HPI unless specifically indicated above     Objective:    BP 134/73   Pulse 98   Ht 5\' 11"  (1.803 m)   Wt 300 lb (136.1 kg)   SpO2 95%   BMI 41.84 kg/m   Wt Readings from Last 3 Encounters:  03/21/22 300 lb (136.1 kg)  12/07/21 279 lb 6.4 oz (126.7 kg)  12/06/21 282 lb (127.9 kg)    Physical Exam Vitals and nursing note reviewed.  Constitutional:      General: He is not in acute distress.    Appearance: He is well-developed. He is obese. He is not diaphoretic.     Comments: Well-appearing, comfortable, cooperative  HENT:     Head: Normocephalic and atraumatic.  Eyes:     General:        Right eye: No discharge.        Left eye: No discharge.     Conjunctiva/sclera: Conjunctivae normal.  Neck:     Thyroid: No thyromegaly.  Cardiovascular:     Rate and Rhythm: Normal rate and regular rhythm.     Pulses: Normal pulses.     Heart sounds: Normal heart sounds. No murmur heard. Pulmonary:     Effort: Pulmonary effort is normal. No respiratory distress.     Breath sounds: Normal breath sounds. No wheezing  or rales.  Musculoskeletal:        General: Normal range of motion.     Cervical back: Normal range of motion and neck supple.  Lymphadenopathy:     Cervical: No cervical adenopathy.  Skin:    General: Skin is warm and dry.     Findings: No erythema or rash.  Neurological:     Mental Status: He is alert and oriented to person, place, and time. Mental status is at baseline.  Psychiatric:        Behavior: Behavior normal.     Comments: Well groomed, good eye contact, normal speech and thoughts    Results for orders placed or performed during the hospital encounter of 12/07/21  Urinalysis, Complete w Microscopic Urine, Random  Result Value Ref Range   Color, Urine AMBER (A) YELLOW   APPearance CLEAR CLEAR   Specific Gravity, Urine 1.015 1.005 - 1.030   pH 6.0 5.0 - 8.0   Glucose, UA NEGATIVE NEGATIVE mg/dL    Hgb urine dipstick NEGATIVE NEGATIVE   Bilirubin Urine SMALL (A) NEGATIVE   Ketones, ur NEGATIVE NEGATIVE mg/dL   Protein, ur NEGATIVE NEGATIVE mg/dL   Nitrite NEGATIVE NEGATIVE   Leukocytes,Ua NEGATIVE NEGATIVE   Squamous Epithelial / LPF NONE SEEN 0 - 5   WBC, UA 0-5 0 - 5 WBC/hpf   RBC / HPF 0-5 0 - 5 RBC/hpf   Bacteria, UA NONE SEEN NONE SEEN   Mucus PRESENT       Assessment & Plan:   Problem List Items Addressed This Visit     Atherosclerosis of native coronary artery of native heart without angina pectoris    On imaging Continue statin      Relevant Medications   atorvastatin (LIPITOR) 40 MG tablet   losartan (COZAAR) 25 MG tablet   Chronic back pain   Relevant Medications   tiZANidine (ZANAFLEX) 4 MG tablet   traMADol (ULTRAM) 50 MG tablet   DDD (degenerative disc disease), lumbar    Re order Tramadol PRN      Relevant Medications   tiZANidine (ZANAFLEX) 4 MG tablet   traMADol (ULTRAM) 50 MG tablet   DOE (dyspnea on exertion)   Essential hypertension    Well-controlled HTN No known complications    Plan:  1.  Continue current BP regimen Losartan 25mg  and Chlorthalidone 25mg  2. Encourage improved lifestyle - low sodium diet, regular exercise 3. Start monitor BP outside office, bring readings to next visit, if persistently >140/90 or new symptoms notify office sooner       Relevant Medications   atorvastatin (LIPITOR) 40 MG tablet   potassium chloride SA (KLOR-CON M) 20 MEQ tablet   losartan (COZAAR) 25 MG tablet   Pre-diabetes - Primary    Previously controlled Due for labs upcoming visit  Plan:  1. Not on any therapy currently  2. Encourage improved lifestyle - low carb, low sugar diet, reduce portion size, continue improving regular exercise      Other Visit Diagnoses     Centrilobular emphysema (HCC)       Relevant Medications   budesonide-formoterol (SYMBICORT) 80-4.5 MCG/ACT inhaler   albuterol (VENTOLIN HFA) 108 (90 Base) MCG/ACT  inhaler   Gastroesophageal reflux disease without esophagitis       Relevant Medications   famotidine (PEPCID) 20 MG tablet   pantoprazole (PROTONIX) 40 MG tablet       Refilled all medications as requested today  Centr   Meds ordered this encounter  Medications  atorvastatin (LIPITOR) 40 MG tablet    Sig: Take 1 tablet (40 mg total) by mouth daily.    Dispense:  90 tablet    Refill:  3   tiZANidine (ZANAFLEX) 4 MG tablet    Sig: Take 1 tablet (4 mg total) by mouth every 8 (eight) hours as needed for muscle spasms.    Dispense:  90 tablet    Refill:  3   budesonide-formoterol (SYMBICORT) 80-4.5 MCG/ACT inhaler    Sig: Take 2 puffs first thing in am and then another 2 puffs about 12 hours later.    Dispense:  1 each    Refill:  12   albuterol (VENTOLIN HFA) 108 (90 Base) MCG/ACT inhaler    Sig: INHALE 2 PUFFS BY MOUTH EVERY 4 HOURS AS NEEDED FOR WHEEZING FOR SHORTNESS OF BREATH    Dispense:  18 g    Refill:  3   famotidine (PEPCID) 20 MG tablet    Sig: Take 1 tablet (20 mg total) by mouth daily after supper. One after supper    Dispense:  90 tablet    Refill:  3   pantoprazole (PROTONIX) 40 MG tablet    Sig: Take 1 tablet (40 mg total) by mouth daily before breakfast.    Dispense:  90 tablet    Refill:  3   potassium chloride SA (KLOR-CON M) 20 MEQ tablet    Sig: Take 1 tablet (20 mEq total) by mouth daily.    Dispense:  90 tablet    Refill:  3   losartan (COZAAR) 25 MG tablet    Sig: Take 1 tablet (25 mg total) by mouth daily.    Dispense:  90 tablet    Refill:  3   traMADol (ULTRAM) 50 MG tablet    Sig: Take 1 tablet (50 mg total) by mouth every 8 (eight) hours as needed.    Dispense:  30 tablet    Refill:  1      Follow up plan: Return in about 6 months (around 09/20/2022) for 6 month fasting lab only then 1 week later Annual Physical.  Future labs ordered for 09/2022  Saralyn Pilar, DO [facility] 03/21/2022, 2:51 PM

## 2022-03-21 NOTE — Assessment & Plan Note (Signed)
Previously controlled Due for labs upcoming visit  Plan:  1. Not on any therapy currently  2. Encourage improved lifestyle - low carb, low sugar diet, reduce portion size, continue improving regular exercise

## 2022-03-22 ENCOUNTER — Ambulatory Visit: Payer: Medicaid Other

## 2022-04-04 DIAGNOSIS — S99911A Unspecified injury of right ankle, initial encounter: Secondary | ICD-10-CM | POA: Diagnosis not present

## 2022-04-04 DIAGNOSIS — M19071 Primary osteoarthritis, right ankle and foot: Secondary | ICD-10-CM | POA: Diagnosis not present

## 2022-04-04 DIAGNOSIS — Z981 Arthrodesis status: Secondary | ICD-10-CM | POA: Diagnosis not present

## 2022-04-04 DIAGNOSIS — Z09 Encounter for follow-up examination after completed treatment for conditions other than malignant neoplasm: Secondary | ICD-10-CM | POA: Diagnosis not present

## 2022-04-18 ENCOUNTER — Telehealth: Payer: Self-pay | Admitting: Family Medicine

## 2022-04-18 DIAGNOSIS — M1712 Unilateral primary osteoarthritis, left knee: Secondary | ICD-10-CM | POA: Diagnosis not present

## 2022-04-18 DIAGNOSIS — Z981 Arthrodesis status: Secondary | ICD-10-CM | POA: Diagnosis not present

## 2022-04-18 DIAGNOSIS — G8929 Other chronic pain: Secondary | ICD-10-CM

## 2022-04-18 DIAGNOSIS — S99911A Unspecified injury of right ankle, initial encounter: Secondary | ICD-10-CM | POA: Diagnosis not present

## 2022-04-18 DIAGNOSIS — M25562 Pain in left knee: Secondary | ICD-10-CM | POA: Diagnosis not present

## 2022-04-18 DIAGNOSIS — M5136 Other intervertebral disc degeneration, lumbar region: Secondary | ICD-10-CM

## 2022-04-18 NOTE — Telephone Encounter (Signed)
Medication Refill - Medication: traMADol (ULTRAM) 50 MG tablet [[id number]]   Has the patient contacted their pharmacy? No. (Agent: If no, request that the patient contact the pharmacy for the refill. If patient does not wish to contact the pharmacy document the reason why and proceed with request.) (Agent: If yes, when and what did the pharmacy advise?)  Preferred Pharmacy (with phone number or street name): [id number] [place], [address]  [address], [place] [address]  Phone:  [phone]  Fax:  [phone]  DEA #:  [id number] Has the patient been seen for an appointment in the last year OR does the patient have an upcoming appointment? Yes.    Agent: Please be advised that RX refills may take up to 3 business days. We ask that you follow-up with your pharmacy.

## 2022-04-19 NOTE — Telephone Encounter (Signed)
Requested medication (s) are due for refill today - unsure  Requested medication (s) are on the active medication list -yes  Future visit scheduled -no  Last refill: 03/21/22 #30 1RF  Notes to clinic: non delegated Rx  Requested Prescriptions  Pending Prescriptions Disp Refills   traMADol (ULTRAM) 50 MG tablet 30 tablet 1    Sig: Take 1 tablet (50 mg total) by mouth every 8 (eight) hours as needed.     Not Delegated - Analgesics:  Opioid Agonists Failed - 04/18/2022  3:22 PM      Failed - This refill cannot be delegated      Failed - Urine Drug Screen completed in last 360 days      Passed - Valid encounter within last 3 months    Recent Outpatient Visits           4 weeks ago Pre-diabetes   [facility], Devonne Doughty, DO   7 months ago Benign prostatic hyperplasia with urinary hesitancy   [facility] Olin Hauser, DO   8 months ago Morbid obesity [facility])   [facility] Olin Hauser, DO   9 months ago Morbid obesity [facility])   [facility] Olin Hauser, DO   11 months ago Acute midline thoracic back pain   [facility], Devonne Doughty, DO                 Requested Prescriptions  Pending Prescriptions Disp Refills   traMADol (ULTRAM) 50 MG tablet 30 tablet 1    Sig: Take 1 tablet (50 mg total) by mouth every 8 (eight) hours as needed.     Not Delegated - Analgesics:  Opioid Agonists Failed - 04/18/2022  3:22 PM      Failed - This refill cannot be delegated      Failed - Urine Drug Screen completed in last 360 days      Passed - Valid encounter within last 3 months    Recent Outpatient Visits           4 weeks ago Pre-diabetes   [place], DO   7 months ago Benign prostatic hyperplasia with urinary hesitancy   [place], DO   8 months ago Morbid  obesity [facility])   [facility] Olin Hauser, DO   9 months ago Morbid obesity [facility])   [place], DO   11 months ago Acute midline thoracic back pain   [place], Devonne Doughty, [place]

## 2022-04-20 ENCOUNTER — Other Ambulatory Visit (HOSPITAL_COMMUNITY): Payer: Self-pay

## 2022-04-25 MED ORDER — TRAMADOL HCL 50 MG PO TABS: 50.0000 mg | ORAL_TABLET | Freq: Three times a day (TID) | ORAL | 1 refills | Status: DC | PRN

## 2022-04-25 NOTE — Telephone Encounter (Signed)
Pt says he is in bad pain and would like to get the Rx for the tramadol ASAP or a call back from the nurse .  [facility]  [facility]@  [phone]

## 2022-04-25 NOTE — Addendum Note (Signed)
Addended by: Olin Hauser on: 04/25/2022 01:29 PM   Modules accepted: Orders

## 2022-04-25 NOTE — Telephone Encounter (Signed)
Please notify patient that he had 1 refill left on his Tramadol rx from October. I believe should still be there.  I will re-send to pharmacy this time.  If he has severe pain that is not improving on medicine, then hospital for evaluation is an option for treating stronger pain if need  Nobie Putnam, [facility] 04/25/2022, 1:28 PM

## 2022-05-24 ENCOUNTER — Ambulatory Visit: Admission: RE | Admit: 2022-05-24 | Payer: Medicaid Other | Source: Ambulatory Visit

## 2022-05-29 ENCOUNTER — Emergency Department
Admission: EM | Admit: 2022-05-29 | Discharge: 2022-05-29 | Disposition: A | Discharge status: Home / Self Care | Payer: Medicaid Other | Attending: Emergency Medicine | Admitting: Emergency Medicine

## 2022-05-29 ENCOUNTER — Other Ambulatory Visit: Payer: Self-pay

## 2022-05-29 ENCOUNTER — Emergency Department: Payer: Medicaid Other

## 2022-05-29 DIAGNOSIS — M25579 Pain in unspecified ankle and joints of unspecified foot: Secondary | ICD-10-CM | POA: Diagnosis not present

## 2022-05-29 DIAGNOSIS — M79604 Pain in right leg: Secondary | ICD-10-CM | POA: Diagnosis not present

## 2022-05-29 DIAGNOSIS — M5459 Other low back pain: Secondary | ICD-10-CM | POA: Diagnosis not present

## 2022-05-29 DIAGNOSIS — G8929 Other chronic pain: Secondary | ICD-10-CM | POA: Diagnosis not present

## 2022-05-29 DIAGNOSIS — M25571 Pain in right ankle and joints of right foot: Secondary | ICD-10-CM | POA: Diagnosis not present

## 2022-05-29 DIAGNOSIS — M7989 Other specified soft tissue disorders: Secondary | ICD-10-CM | POA: Diagnosis not present

## 2022-05-29 DIAGNOSIS — I10 Essential (primary) hypertension: Secondary | ICD-10-CM | POA: Diagnosis not present

## 2022-05-29 MED ORDER — HYDROCODONE-ACETAMINOPHEN 5-325 MG PO TABS: 1.0000 | ORAL_TABLET | Freq: Four times a day (QID) | ORAL | 0 refills | Status: AC | PRN

## 2022-05-29 MED ORDER — HYDROMORPHONE HCL 1 MG/ML IJ SOLN
1.0000 mg | Freq: Once | INTRAMUSCULAR | Status: AC
  Administered 2022-05-29: 1 mg via INTRAMUSCULAR
  Filled 2022-05-29: qty 1

## 2022-05-29 MED ORDER — OXYCODONE HCL 5 MG PO TABS
5.0000 mg | ORAL_TABLET | Freq: Once | ORAL | Status: AC
  Administered 2022-05-29: 5 mg via ORAL
  Filled 2022-05-29: qty 1

## 2022-05-29 NOTE — ED Triage Notes (Signed)
BIB acems for ankle pain. Pt states he has had multiple ankle fusions.Pt is currently wearing an aircast. Pt has been having chronic pain in same ankle. Pt is prescribed tramadol but doesnt take it bc he states "it doesnt work".  Vitals WNL for EMS. 110 96% 140/84   Pt advised he has not called his doctor for his chronic ankle problems and pain.

## 2022-05-29 NOTE — ED Provider Notes (Signed)
[facility] Provider Note    Event Date/Time   First MD Initiated Contact with Patient 05/29/22 2101     (approximate)   History   Ankle Pain   HPI  Tom [name] is a 56 y.o. male with history of hypertension, osteoarthritis, and degenerative disc disease, chronic back pain and fusion of right ankle presents to the emergency department for treatment and evaluation of acute on chronic right ankle pain.  Patient states that he has been prescribed tramadol but it does not work and therefore he does not take it.  3 days ago he and his wife were dancing and pain in the right ankle became severe.  He does not think that he injured it in any way but he is not totally sure.  Surgery was performed at [facility] however he is no longer affiliated with his surgeon as they disagreed on further treatment.       Physical Exam   Triage Vital Signs: ED Triage Vitals  Enc Vitals Group     BP 05/29/22 2059 127/72     Pulse Rate 05/29/22 2059 (!) 105     Resp 05/29/22 2059 16     Temp 05/29/22 2059 99 F (37.2 C)     Temp Source 05/29/22 2059 Oral     SpO2 05/29/22 2059 99 %     Weight 05/29/22 2057 300 lb (136.1 kg)     Height 05/29/22 2057 5\' 11"  (1.803 m)     Head Circumference --      Peak Flow --      Pain Score 05/29/22 2100 10     Pain Loc --      Pain Edu? --      Excl. in GC? --     Most recent vital signs: Vitals:   05/29/22 2059  BP: 127/72  Pulse: (!) 105  Resp: 16  Temp: 99 F (37.2 C)  SpO2: 99%     General: Awake, no distress.  CV:  Good peripheral perfusion.  Resp:  Normal effort.  Abd:  No distention.  Other:  Pitting edema of the right lower extremity from mid calf to toes.  Dorsalis pedis pulses palpable.  Capillary refill is less than 2 seconds.  Patient able to perform range of motion of toes.  No open wounds or lesions noted over the lower extremity.   ED Results / Procedures / Treatments   Labs (all labs ordered are  listed, but only abnormal results are displayed) Labs Reviewed - No data to display   EKG  Not indicated.   RADIOLOGY  14/10/23 negative for acute findings of DVT of the right lower extremity.   PROCEDURES:  Critical Care performed: No  Procedures   MEDICATIONS ORDERED IN ED: Medications  oxyCODONE (Oxy IR/ROXICODONE) immediate release tablet 5 mg (5 mg Oral Given 05/29/22 2121)  HYDROmorphone (DILAUDID) injection 1 mg (1 mg Intramuscular Given 05/29/22 2224)     IMPRESSION / MDM / ASSESSMENT AND PLAN / ED COURSE  I reviewed the triage vital signs and the nursing notes.                              Differential diagnosis includes, but is not limited to, acute on chronic pain, DVT, peripheral edema.  Patient's presentation is most consistent with acute complicated illness / injury requiring diagnostic workup.  56 year old male presenting to the emergency department for treatment and evaluation  of acute on chronic pain to the right ankle.  On exam, he does have 3+ pitting edema to the right lower extremity under the CAM Walker boot that he has been wearing for several months.  Pain became this severe 3 days ago after he and his wife were "dancing and messing around."  He has not spoken with his podiatrist at [facility] and states that they have had a disagreement about treatment.  I have attempted to review his [facility] records, however everything is locked unless the patient is willing to provide consent.  At this time, do not feel that reviewing the records is going to add anything to the treatment plan tonight, therefore consent was not requested.  Plan is to get an ultrasound of the right lower extremity and attempt to aid in pain control.  On exam, his leg is warm and well-perfused.  Compartment is soft.  Patient is a little tachycardic but this is likely due to pain.  Ultrasound is negative for DVT.  Patient's pain is better controlled at this time.  Plan will be to have him follow-up  with his primary care provider or podiatrist if pain is not improving.     FINAL CLINICAL IMPRESSION(S) / ED DIAGNOSES   Final diagnoses:  Chronic pain of right ankle     Rx / DC Orders   ED Discharge Orders          Ordered    HYDROcodone-acetaminophen (NORCO/VICODIN) 5-325 MG tablet  Every 6 hours PRN        05/29/22 2320             Note:  This document was prepared using Dragon voice recognition software and may include unintentional dictation errors.   Chinita Pester, FNP 05/29/22 2335    Georga Hacking, MD 05/30/22 Aretha Parrot

## 2022-05-30 ENCOUNTER — Telehealth: Payer: Self-pay | Admitting: Licensed Clinical Social Worker

## 2022-05-30 NOTE — Patient Outreach (Signed)
Transition Care Management Unsuccessful Follow-up Telephone Call  Date of discharge and from where:  05/29/22 from [facility]  Attempts:  1st Attempt  Reason for unsuccessful TCM follow-up call:  Unable to leave message

## 2022-05-31 ENCOUNTER — Other Ambulatory Visit: Payer: Self-pay | Admitting: Family Medicine

## 2022-05-31 DIAGNOSIS — I10 Essential (primary) hypertension: Secondary | ICD-10-CM

## 2022-05-31 DIAGNOSIS — M545 Low back pain, unspecified: Secondary | ICD-10-CM

## 2022-05-31 DIAGNOSIS — M5136 Other intervertebral disc degeneration, lumbar region: Secondary | ICD-10-CM

## 2022-05-31 DIAGNOSIS — K219 Gastro-esophageal reflux disease without esophagitis: Secondary | ICD-10-CM

## 2022-06-01 ENCOUNTER — Other Ambulatory Visit: Payer: Self-pay | Admitting: Family Medicine

## 2022-06-01 DIAGNOSIS — M5136 Other intervertebral disc degeneration, lumbar region: Secondary | ICD-10-CM

## 2022-06-01 DIAGNOSIS — M545 Low back pain, unspecified: Secondary | ICD-10-CM

## 2022-06-01 MED ORDER — LOSARTAN POTASSIUM 25 MG PO TABS: 25.0000 mg | ORAL_TABLET | Freq: Every day | ORAL | 3 refills | Status: DC

## 2022-06-01 MED ORDER — PANTOPRAZOLE SODIUM 40 MG PO TBEC: 40.0000 mg | DELAYED_RELEASE_TABLET | Freq: Every day | ORAL | 3 refills | Status: DC

## 2022-06-01 MED ORDER — POTASSIUM CHLORIDE CRYS ER 20 MEQ PO TBCR: 20.0000 meq | EXTENDED_RELEASE_TABLET | Freq: Every day | ORAL | 3 refills | Status: DC

## 2022-06-01 MED ORDER — TIZANIDINE HCL 4 MG PO TABS: 4.0000 mg | ORAL_TABLET | Freq: Three times a day (TID) | ORAL | 3 refills | Status: DC | PRN

## 2022-06-01 NOTE — Telephone Encounter (Signed)
Requested medication (s) are due for refill today: no sending to different pharmacy  Requested medication (s) are on the active medication list: yes  Last refill:  03/21/22 #90/3  Future visit scheduled: no  Notes to clinic:  not delegated. [facility]      Requested Prescriptions  Pending Prescriptions Disp Refills   tiZANidine (ZANAFLEX) 4 MG tablet [Pharmacy Med Name: tiZANidine HCl 4 MG Oral Tablet] 60 tablet 0    Sig: TAKE 1 TABLET BY MOUTH EVERY 8 HOURS AS NEEDED FOR MUSCLE SPASM     Not Delegated - Cardiovascular:  Alpha-2 Agonists - tizanidine Failed - 06/01/2022  4:21 PM      Failed - This refill cannot be delegated      Passed - Valid encounter within last 6 months    Recent Outpatient Visits           2 months ago Pre-diabetes   [facility] [place], [name], DO   9 months ago Benign prostatic hyperplasia with urinary hesitancy   [facility] [name], DO   9 months ago Morbid obesity [facility])   [facility] [name], DO   10 months ago Morbid obesity [facility])   [facility] [name], DO   1 year ago Acute midline thoracic back pain   [facility] [place], [name], DO              Signed Prescriptions Disp Refills   losartan (COZAAR) 25 MG tablet 90 tablet 3    Sig: Take 1 tablet (25 mg total) by mouth daily.     Cardiovascular:  Angiotensin Receptor Blockers Failed - 06/01/2022  4:21 PM      Failed - Cr in normal range and within 180 days    Creat  Date Value Ref Range Status  01/20/2021 1.16 0.70 - 1.30 mg/dL Final   Creatinine, Ser  Date Value Ref Range Status  12/02/2021 1.10 0.61 - 1.24 mg/dL Final         Failed - K in normal range and within 180 days    Potassium  Date Value Ref Range Status  12/02/2021 3.8 3.5 - 5.1 mmol/L Final         Passed - Patient is not pregnant      Passed -  Last BP in normal range    BP Readings from Last 1 Encounters:  05/29/22 127/72         Passed - Valid encounter within last 6 months    Recent Outpatient Visits           2 months ago Pre-diabetes   [facility] [name], DO   9 months ago Benign prostatic hyperplasia with urinary hesitancy   [facility] [name], DO   9 months ago Morbid obesity [facility])   [facility] [name], DO   10 months ago Morbid obesity [facility])   [facility] [name], DO   1 year ago Acute midline thoracic back pain   [facility] [place], [name], DO               pantoprazole (PROTONIX) 40 MG tablet 90 tablet 3    Sig: Take 1 tablet (40 mg total) by mouth daily before breakfast.     Gastroenterology: Proton Pump Inhibitors Passed - 06/01/2022  4:21 PM      Passed - Valid encounter within last 12 months    Recent Outpatient Visits           2 months ago Pre-diabetes   [facility] [place], [name], DO   9 months ago Benign prostatic hyperplasia with urinary hesitancy   [facility] [name], DO   9 months ago Morbid obesity [facility])   [facility] [name], DO   10 months ago Morbid obesity [facility])   [facility] [name], DO   1 year ago Acute midline thoracic back pain   [facility], [name], DO               potassium chloride SA (KLOR-CON M) 20 MEQ tablet 90 tablet 3    Sig: Take 1 tablet (20 mEq total) by mouth daily.     Endocrinology:  Minerals - Potassium Supplementation Passed - 06/01/2022  4:21 PM      Passed - K in normal range and within 360 days    Potassium  Date Value Ref Range Status  12/02/2021 3.8 3.5 - 5.1 mmol/L Final         Passed - Cr in normal range and within 360 days     Creat  Date Value Ref Range Status  01/20/2021 1.16 0.70 - 1.30 mg/dL Final   Creatinine, Ser  Date Value Ref Range Status  12/02/2021 1.10 0.61 - 1.24 mg/dL Final         Passed - Valid encounter within last 12 months    Recent Outpatient Visits           2 months ago Pre-diabetes   [facility] [name], DO   9 months ago Benign prostatic hyperplasia with urinary hesitancy   [facility] [place], [name], DO   9 months ago Morbid obesity [facility])   [facility] [name], DO   10 months ago Morbid obesity [facility])   [facility] [name], DO   1 year ago Acute midline thoracic back pain   [facility] [place], [name], DO              Refused Prescriptions Disp Refills   losartan (COZAAR) 25 MG tablet [Pharmacy Med Name: Losartan Potassium 25 MG Oral Tablet] 90 tablet 0    Sig: Take 1 tablet by mouth once daily     Cardiovascular:  Angiotensin Receptor Blockers Failed - 06/01/2022  4:21 PM      Failed - Cr in normal range and within 180 days    Creat  Date Value Ref Range Status  01/20/2021 1.16 0.70 - 1.30 mg/dL Final   Creatinine, Ser  Date Value Ref Range Status  12/02/2021 1.10 0.61 - 1.24 mg/dL Final         Failed - K in normal range and within 180 days    Potassium  Date Value Ref Range Status  12/02/2021 3.8 3.5 - 5.1 mmol/L Final         Passed - Patient is not pregnant      Passed - Last BP in normal range    BP Readings from Last 1 Encounters:  05/29/22 127/72         Passed - Valid encounter within last 6 months    Recent Outpatient Visits  2 months ago Pre-diabetes   [facility] [name], DO   9 months ago Benign prostatic hyperplasia with urinary hesitancy   [facility] [name], DO   9 months ago [facility]bid [facility])   [name]   10 months ago Morbid obesity [facility])   [facility] [name], DO   1 year ago Acute midline thoracic back pain   [facility] [place], [name], [place]

## 2022-06-01 NOTE — Telephone Encounter (Signed)
Requested Prescriptions  Pending Prescriptions Disp Refills   tiZANidine (ZANAFLEX) 4 MG tablet [Pharmacy Med Name: tiZANidine HCl 4 MG Oral Tablet] 60 tablet 0    Sig: TAKE 1 TABLET BY MOUTH EVERY 8 HOURS AS NEEDED FOR MUSCLE SPASM     Not Delegated - Cardiovascular:  Alpha-2 Agonists - tizanidine Failed - 05/31/2022 12:43 PM      Failed - This refill cannot be delegated      Passed - Valid encounter within last 6 months    Recent Outpatient Visits           2 months ago Pre-diabetes   [facility], [name], DO   9 months ago Benign prostatic hyperplasia with urinary hesitancy   [facility] [name], DO   9 months ago Morbid obesity [facility])   [facility] [name], DO   10 months ago Morbid obesity [facility])   [facility] [name], DO   1 year ago Acute midline thoracic back pain   [facility] [place], [name], DO               losartan (COZAAR) 25 MG tablet [Pharmacy Med Name: Losartan Potassium 25 MG Oral Tablet] 90 tablet 0    Sig: Take 1 tablet by mouth once daily     Cardiovascular:  Angiotensin Receptor Blockers Passed - 05/31/2022 12:43 PM      Passed - Cr in normal range and within 180 days    Creat  Date Value Ref Range Status  01/20/2021 1.16 0.70 - 1.30 mg/dL Final   Creatinine, Ser  Date Value Ref Range Status  12/02/2021 1.10 0.61 - 1.24 mg/dL Final         Passed - K in normal range and within 180 days    Potassium  Date Value Ref Range Status  12/02/2021 3.8 3.5 - 5.1 mmol/L Final         Passed - Patient is not pregnant      Passed - Last BP in normal range    BP Readings from Last 1 Encounters:  05/29/22 127/72         Passed - Valid encounter within last 6 months    Recent Outpatient Visits           2 months ago Pre-diabetes   [facility] [name], DO   9  months ago Benign prostatic hyperplasia with urinary hesitancy   [facility] [name], DO   9 months ago Morbid obesity [facility])   [facility] [name], DO   10 months ago Morbid obesity [facility])   [facility] [name], DO   1 year ago Acute midline thoracic back pain   [facility] [place], [name], DO

## 2022-06-01 NOTE — Telephone Encounter (Signed)
Requested medication (s) are due for refill today: No  Requested medication (s) are on the active medication list: yes    Last refill: 03/21/22  #90  3 refills  Future visit scheduled no  Notes to clinic:Refills available. Cannot refuse non-delegated meds per protocol.  Requested Prescriptions  Pending Prescriptions Disp Refills   tiZANidine (ZANAFLEX) 4 MG tablet [Pharmacy Med Name: tiZANidine HCl 4 MG Oral Tablet] 60 tablet 0    Sig: TAKE 1 TABLET BY MOUTH EVERY 8 HOURS AS NEEDED FOR MUSCLE SPASM     Not Delegated - Cardiovascular:  Alpha-2 Agonists - tizanidine Failed - 05/31/2022 12:43 PM      Failed - This refill cannot be delegated      Passed - Valid encounter within last 6 months    Recent Outpatient Visits           2 months ago Pre-diabetes   [facility] [place], [name], DO   9 months ago Benign prostatic hyperplasia with urinary hesitancy   [facility] [name], DO   9 months ago [facility]bid [facility])   [facility] [name], DO   10 months ago Morbid obesity [facility])   [facility] [name], DO   1 year ago Acute midline thoracic back pain   [facility] [place], [name], DO              Refused Prescriptions Disp Refills   losartan (COZAAR) 25 MG tablet [Pharmacy Med Name: Losartan Potassium 25 MG Oral Tablet] 90 tablet 0    Sig: Take 1 tablet by mouth once daily     Cardiovascular:  Angiotensin Receptor Blockers Passed - 05/31/2022 12:43 PM      Passed - Cr in normal range and within 180 days    Creat  Date Value Ref Range Status  01/20/2021 1.16 0.70 - 1.30 mg/dL Final   Creatinine, Ser  Date Value Ref Range Status  12/02/2021 1.10 0.61 - 1.24 mg/dL Final         Passed - K in normal range and within 180 days    Potassium  Date Value Ref Range Status  12/02/2021 3.8 3.5 - 5.1 mmol/L Final         Passed -  Patient is not pregnant      Passed - Last BP in normal range    BP Readings from Last 1 Encounters:  05/29/22 127/72         Passed - Valid encounter within last 6 months    Recent Outpatient Visits           2 months ago Pre-diabetes   [facility] [name], DO   9 months ago Benign prostatic hyperplasia with urinary hesitancy   [facility] [name], DO   9 months ago Morbid obesity [facility])   [facility] [name], DO   10 months ago Morbid obesity [facility])   [facility] [name], DO   1 year ago Acute midline thoracic back pain   [facility] [place], [name], DO

## 2022-06-01 NOTE — Telephone Encounter (Signed)
Pt called to report that he [facility] [place],   potassium chloride SA (KLOR-CON M) 20 MEQ tablet  pantoprazole (PROTONIX) 40 MG tablet  losartan (COZAAR) 25 MG tablet    Wants all of this refilled, says he is almost out and [facility] [place]   [id number] [place] [place] [address]  [address] [place] [address]  Phone: [phone] Fax: [phone]

## 2022-06-01 NOTE — Telephone Encounter (Signed)
[facility] of previous pharmacy.  Requested Prescriptions  Pending Prescriptions Disp Refills   tiZANidine (ZANAFLEX) 4 MG tablet [Pharmacy Med Name: tiZANidine HCl 4 MG Oral Tablet] 60 tablet 0    Sig: TAKE 1 TABLET BY MOUTH EVERY 8 HOURS AS NEEDED FOR MUSCLE SPASM     Not Delegated - Cardiovascular:  Alpha-2 Agonists - tizanidine Failed - 06/01/2022  4:21 PM      Failed - This refill cannot be delegated      Passed - Valid encounter within last 6 months    Recent Outpatient Visits           2 months ago Pre-diabetes   [facility] [place], [name], DO   9 months ago Benign prostatic hyperplasia with urinary hesitancy   [facility] [name], DO   9 months ago Morbid obesity [facility])   [facility] [name], DO   10 months ago Morbid obesity [facility])   [facility] [name], DO   1 year ago Acute midline thoracic back pain   [facility], [name], DO               losartan (COZAAR) 25 MG tablet 90 tablet 3    Sig: Take 1 tablet (25 mg total) by mouth daily.     Cardiovascular:  Angiotensin Receptor Blockers Failed - 06/01/2022  4:21 PM      Failed - Cr in normal range and within 180 days    Creat  Date Value Ref Range Status  01/20/2021 1.16 0.70 - 1.30 mg/dL Final   Creatinine, Ser  Date Value Ref Range Status  12/02/2021 1.10 0.61 - 1.24 mg/dL Final         Failed - K in normal range and within 180 days    Potassium  Date Value Ref Range Status  12/02/2021 3.8 3.5 - 5.1 mmol/L Final         Passed - Patient is not pregnant      Passed - Last BP in normal range    BP Readings from Last 1 Encounters:  05/29/22 127/72         Passed - Valid encounter within last 6 months    Recent Outpatient Visits           2 months ago Pre-diabetes   [facility]  [name], DO   9 months ago Benign prostatic hyperplasia with urinary hesitancy   [facility] [name], DO   9 months ago Morbid obesity [facility])   [facility] [name], DO   10 months ago Morbid obesity [facility])   [facility] [name], DO   1 year ago Acute midline thoracic back pain   [facility] [place], [name], DO               pantoprazole (PROTONIX) 40 MG tablet 90 tablet 3    Sig: Take 1 tablet (40 mg total) by mouth daily before breakfast.     Gastroenterology: Proton Pump Inhibitors Passed - 06/01/2022  4:21 PM      Passed - Valid encounter within last 12 months    Recent Outpatient Visits           2 months ago Pre-diabetes   [facility], Lyn [place]  J, DO   9 months ago Benign prostatic hyperplasia with urinary hesitancy   [facility] [name], DO   9 months ago Morbid obesity [facility]   [facility] [name], DO   10 months ago Morbid obesity [facility])   [facility] [name], DO   1 year ago Acute midline thoracic back pain   [facility] [place], [name], DO               potassium chloride SA (KLOR-CON M) 20 MEQ tablet 90 tablet 3    Sig: Take 1 tablet (20 mEq total) by mouth daily.     Endocrinology:  Minerals - Potassium Supplementation Passed - 06/01/2022  4:21 PM      Passed - K in normal range and within 360 days    Potassium  Date Value Ref Range Status  12/02/2021 3.8 3.5 - 5.1 mmol/L Final         Passed - Cr in normal range and within 360 days    Creat  Date Value Ref Range Status  01/20/2021 1.16 0.70 - 1.30 mg/dL Final   Creatinine, Ser  Date Value Ref Range Status  12/02/2021 1.10 0.61 - 1.24 mg/dL Final         Passed - Valid encounter within last 12 months     Recent Outpatient Visits           2 months ago Pre-diabetes   [facility] [name], DO   9 months ago Benign prostatic hyperplasia with urinary hesitancy   [facility] [place], [name], DO   9 months ago Morbid obesity [facility])   [facility] [name], DO   10 months ago Morbid obesity [facility])   [facility] [name], DO   1 year ago Acute midline thoracic back pain   [facility], [name], DO              Refused Prescriptions Disp Refills   losartan (COZAAR) 25 MG tablet [Pharmacy Med Name: Losartan Potassium 25 MG Oral Tablet] 90 tablet 0    Sig: Take 1 tablet by mouth once daily     Cardiovascular:  Angiotensin Receptor Blockers Failed - 06/01/2022  4:21 PM      Failed - Cr in normal range and within 180 days    Creat  Date Value Ref Range Status  01/20/2021 1.16 0.70 - 1.30 mg/dL Final   Creatinine, Ser  Date Value Ref Range Status  12/02/2021 1.10 0.61 - 1.24 mg/dL Final         Failed - K in normal range and within 180 days    Potassium  Date Value Ref Range Status  12/02/2021 3.8 3.5 - 5.1 mmol/L Final         Passed - Patient is not pregnant      Passed - Last BP in normal range    BP Readings from Last 1 Encounters:  05/29/22 127/72         Passed - Valid encounter within last 6 months    Recent Outpatient Visits           2 months ago Pre-diabetes   [facility] [name], DO   9 months ago Benign prostatic hyperplasia with urinary hesitancy   [facility] [place], [name], DO  9 months ago Morbid obesity [facility])   [facility] [name], DO   10 months ago Morbid obesity [facility])   [facility] [name], DO   1 year ago Acute midline thoracic back pain   [facility]  [place], [name], [place]

## 2022-06-02 ENCOUNTER — Other Ambulatory Visit (HOSPITAL_COMMUNITY): Payer: Self-pay

## 2022-06-02 ENCOUNTER — Other Ambulatory Visit: Payer: Self-pay | Admitting: Family Medicine

## 2022-06-02 DIAGNOSIS — M545 Low back pain, unspecified: Secondary | ICD-10-CM

## 2022-06-02 DIAGNOSIS — M5136 Other intervertebral disc degeneration, lumbar region: Secondary | ICD-10-CM

## 2022-06-02 NOTE — Telephone Encounter (Signed)
Requested medication (s) are due for refill today - no  Requested medication (s) are on the active medication list -no  Future visit scheduled -no  Last refill: medication no longer current on medication list  Notes to clinic: non delegated Rx  Requested Prescriptions  Pending Prescriptions Disp Refills   traMADol (ULTRAM) 50 MG tablet [Pharmacy Med Name: TRAMADOL HCL 50 MG TABLET] 30 tablet 1    Sig: TAKE 1 TABLET BY MOUTH EVERY 8 HOURS AS NEEDED.     Not Delegated - Analgesics:  Opioid Agonists Failed - 06/02/2022  7:59 AM      Failed - This refill cannot be delegated      Failed - Urine Drug Screen completed in last 360 days      Passed - Valid encounter within last 3 months    Recent Outpatient Visits           2 months ago Pre-diabetes   [facility] [place], [name], DO   9 months ago Benign prostatic hyperplasia with urinary hesitancy   [facility] [name], DO   9 months ago Morbid obesity [facility])   [facility] [name], DO   10 months ago Morbid obesity [facility])   [facility] [name], DO   1 year ago Acute midline thoracic back pain   [facility], [name], DO               traMADol (ULTRAM) 50 MG tablet [Pharmacy Med Name: TRAMADOL HCL 50 MG TABLET] 30 tablet 1    Sig: TAKE 1 TABLET BY MOUTH EVERY 8 HOURS AS NEEDED.     Not Delegated - Analgesics:  Opioid Agonists Failed - 06/02/2022  7:59 AM      Failed - This refill cannot be delegated      Failed - Urine Drug Screen completed in last 360 days      Passed - Valid encounter within last 3 months    Recent Outpatient Visits           2 months ago Pre-diabetes   [facility] [place], [name], DO   9 months ago Benign prostatic hyperplasia with urinary hesitancy   [facility] [name], DO   9 months ago  Morbid obesity [facility])   [facility] [name], DO   10 months ago Morbid obesity [facility])   [facility] [name], DO   1 year ago Acute midline thoracic back pain   [facility] [name], DO                 Requested Prescriptions  Pending Prescriptions Disp Refills   traMADol (ULTRAM) 50 MG tablet [Pharmacy Med Name: TRAMADOL HCL 50 MG TABLET] 30 tablet 1    Sig: TAKE 1 TABLET BY MOUTH EVERY 8 HOURS AS NEEDED.     Not Delegated - Analgesics:  Opioid Agonists Failed - 06/02/2022  7:59 AM      Failed - This refill cannot be delegated      Failed - Urine Drug Screen completed in last 360 days      Passed - Valid encounter within last 3 months    Recent Outpatient Visits           2 months ago Pre-diabetes   [facility] [place], [name], [place]  9 months ago Benign prostatic hyperplasia with urinary hesitancy   [facility] [name], DO   9 months ago Morbid obesity [facility])   [facility] [name], DO   10 months ago Morbid obesity [facility])   [facility] [name], DO   1 year ago Acute midline thoracic back pain   [facility] [place], [name], DO               traMADol (ULTRAM) 50 MG tablet [Pharmacy Med Name: TRAMADOL HCL 50 MG TABLET] 30 tablet 1    Sig: TAKE 1 TABLET BY MOUTH EVERY 8 HOURS AS NEEDED.     Not Delegated - Analgesics:  Opioid Agonists Failed - 06/02/2022  7:59 AM      Failed - This refill cannot be delegated      Failed - Urine Drug Screen completed in last 360 days      Passed - Valid encounter within last 3 months    Recent Outpatient Visits           2 months ago Pre-diabetes   [facility] [name], DO   9 months ago Benign prostatic hyperplasia with urinary hesitancy   [facility] [name], DO   9 months ago Morbid obesity [facility])   [facility] [name], DO   10 months ago Morbid obesity [facility])   [facility] [name], DO   1 year ago Acute midline thoracic back pain   [facility] [place], [name], [place]

## 2022-06-06 ENCOUNTER — Other Ambulatory Visit (HOSPITAL_COMMUNITY): Payer: Self-pay

## 2022-06-22 ENCOUNTER — Other Ambulatory Visit: Payer: Self-pay

## 2022-06-22 ENCOUNTER — Emergency Department
Admission: EM | Admit: 2022-06-22 | Discharge: 2022-06-22 | Discharge status: Against Medical Advice | Payer: Medicaid Other | Attending: Emergency Medicine | Admitting: Emergency Medicine

## 2022-06-22 ENCOUNTER — Ambulatory Visit: Payer: Medicaid Other | Admitting: Physical Therapy

## 2022-06-22 ENCOUNTER — Encounter: Payer: Self-pay | Admitting: Emergency Medicine

## 2022-06-22 DIAGNOSIS — R936 Abnormal findings on diagnostic imaging of limbs: Secondary | ICD-10-CM | POA: Diagnosis not present

## 2022-06-22 DIAGNOSIS — R059 Cough, unspecified: Secondary | ICD-10-CM | POA: Insufficient documentation

## 2022-06-22 DIAGNOSIS — J45909 Unspecified asthma, uncomplicated: Secondary | ICD-10-CM | POA: Insufficient documentation

## 2022-06-22 DIAGNOSIS — E871 Hypo-osmolality and hyponatremia: Secondary | ICD-10-CM | POA: Diagnosis not present

## 2022-06-22 DIAGNOSIS — R600 Localized edema: Secondary | ICD-10-CM | POA: Diagnosis not present

## 2022-06-22 DIAGNOSIS — Z5321 Procedure and treatment not carried out due to patient leaving prior to being seen by health care provider: Secondary | ICD-10-CM | POA: Insufficient documentation

## 2022-06-22 DIAGNOSIS — R601 Generalized edema: Secondary | ICD-10-CM | POA: Diagnosis not present

## 2022-06-22 DIAGNOSIS — R609 Edema, unspecified: Secondary | ICD-10-CM | POA: Diagnosis not present

## 2022-06-22 DIAGNOSIS — R0602 Shortness of breath: Secondary | ICD-10-CM | POA: Insufficient documentation

## 2022-06-22 DIAGNOSIS — M25571 Pain in right ankle and joints of right foot: Secondary | ICD-10-CM | POA: Diagnosis not present

## 2022-06-22 DIAGNOSIS — R262 Difficulty in walking, not elsewhere classified: Secondary | ICD-10-CM | POA: Diagnosis not present

## 2022-06-22 DIAGNOSIS — R079 Chest pain, unspecified: Secondary | ICD-10-CM | POA: Diagnosis not present

## 2022-06-22 DIAGNOSIS — Z981 Arthrodesis status: Secondary | ICD-10-CM | POA: Diagnosis not present

## 2022-06-22 NOTE — ED Triage Notes (Signed)
Pt to triage via w/c with no distress noted; reports SHOB esp with exertion x 33mos and nonprod cough when lying supine; st hx asthma and is out of his neb & inhalers

## 2022-06-23 DIAGNOSIS — R7401 Elevation of levels of liver transaminase levels: Secondary | ICD-10-CM | POA: Diagnosis not present

## 2022-06-23 DIAGNOSIS — N179 Acute kidney failure, unspecified: Secondary | ICD-10-CM | POA: Diagnosis not present

## 2022-06-23 DIAGNOSIS — R601 Generalized edema: Secondary | ICD-10-CM | POA: Diagnosis not present

## 2022-06-24 DIAGNOSIS — R162 Hepatomegaly with splenomegaly, not elsewhere classified: Secondary | ICD-10-CM | POA: Diagnosis not present

## 2022-06-24 DIAGNOSIS — F1090 Alcohol use, unspecified, uncomplicated: Secondary | ICD-10-CM | POA: Diagnosis not present

## 2022-06-24 DIAGNOSIS — N179 Acute kidney failure, unspecified: Secondary | ICD-10-CM | POA: Diagnosis not present

## 2022-06-24 DIAGNOSIS — R7989 Other specified abnormal findings of blood chemistry: Secondary | ICD-10-CM | POA: Diagnosis not present

## 2022-06-24 DIAGNOSIS — R188 Other ascites: Secondary | ICD-10-CM | POA: Diagnosis not present

## 2022-06-24 DIAGNOSIS — J45909 Unspecified asthma, uncomplicated: Secondary | ICD-10-CM | POA: Diagnosis not present

## 2022-06-24 DIAGNOSIS — K7689 Other specified diseases of liver: Secondary | ICD-10-CM | POA: Diagnosis not present

## 2022-06-24 DIAGNOSIS — R601 Generalized edema: Secondary | ICD-10-CM | POA: Diagnosis not present

## 2022-06-25 DIAGNOSIS — R0602 Shortness of breath: Secondary | ICD-10-CM | POA: Diagnosis not present

## 2022-06-29 ENCOUNTER — Ambulatory Visit: Payer: Medicaid Other | Admitting: Internal Medicine

## 2022-06-29 NOTE — Progress Notes (Deleted)
Tom [name], male    DOB: May 22, 1966,    MRN: [id number]   Brief patient profile:  51 yowm MM/quit smoking 2017 @ wt  240 not requiring any meds then but around spring 2022  doe rx with spriva dpi > " no better" and referred to pulmonary clinic in [facility]  04/13/2021 by Dr Gust Brooms  for copd eval           History of Present Illness  04/13/2021  Pulmonary/ 1st office eval/ Nhu Glasby / [employer] scientist Complaint  Patient presents with   Consult    COPD- sob, coughing, wheezing,   Dyspnea:  MMRC2 = can't walk a nl pace on a flat grade s sob but does fine slow and flat  Cough: lots of cough/congestion 24/7 > just white mucus  Sleep: bed is flat, whole bunch of pillows SABA use: 4x daily and sometimes at night and uses sister's neb helps the most   Rec Plan A = Automatic = Always=    Symbicort Take 2 puffs first thing in am and then another 2 puffs about 12 hours later.   Work on inhaler technique  Prednisone 10 mg take  4 each am x 2 days,   2 each am x 2 days,  1 each am x 2 days and stop  Pantoprazole (protonix) 40 mg   Take  30-60 min before first meal of the day and Pepcid (famotidine)  20 mg after supper until return to office - this is the best way to tell whether stomach acid is contributing to your problem.   Plan B = Backup (to supplement plan A, not to replace it) Only use your albuterol inhaler as a rescue medication  GERD diet reviewed, bed blocks rec    05/25/2021  f/u ov/Jaishon Krisher/ [facility] re: copd / AB    maint on symbicort/protonix/  Chief Complaint  Patient presents with   Follow-up    DOE-  Dyspnea:  20 -30 min around a grocery store slow pace, can't do hills  Cough: some better  Sleeping: bed blocks/ but even sleeping  on pillows wakes up 3-4 h p lie down/ has to use nebulizer since ran out of symbicort160 and didn't know he could refill it  SABA use: 2-3 x per day / neb bid avg 02: no  Covid status:  vax x 2  Rec Plan A = Automatic  = Always=   Symbicort 160 Take 2 puffs first thing in am and then another 2 puffs about 12 hours later.  Prednisone 10 mg take  4 each am x 2 days,   2 each am x 2 days,  1 each am x 2 days and stop  Plan B = Backup (to supplement plan A, not to replace it) Only use your albuterol inhaler as a rescue medication  Plan C = Crisis (instead of Plan B but only if Plan B stops working) - only use your albuterol nebulizer if you first try Plan B and it fails to help > ok to use the nebulizer up to every 4 hours but if start needing it regularly call for immediate appointment Ok to try albuterol 15 min before an activity (on alternating days)  that you know would usually make you short of breath  Plan D = Doctor - call me if B and C not adequate Plan E = ER - go to ER or call 911 if all else fails   Please schedule a  follow up visit in 3 months but call sooner if needed with PFT in meantime     09/16/2021  f/u ov/Willow Shidler/ [facility] re: AB not copd    maint on symbicort 160   Chief Complaint  Patient presents with   Follow-up    C/o sob, prod cough with clear sputum and wheezing.   Dyspnea:  able to do  [employer] and no HC paking/ can now do hills  Cough: cough is better  Sleeping: all better on bed blocks  SABA use: much less hfa/ never neb 02: none  Covid status:   vax x 3  Rec We will call you for lung cancer screening  No change in medications Keep working your weight   ER  10/27/21  pulled fluid off but didn't help breathing   11/26/2021  extended f/u ov/Ashana Tullo/ [facility] re: AB copd    maint on symb 80 2bid/ saba  Chief Complaint  Patient presents with   Follow-up    SOB with exertion, wheezing and dry cough mainly in the morning.   Dyspnea:  more difficult to do [employer]/ gradually worse x months assoc with wt gain  To er with neg w/u rx lasix which helped swelling but not breathing and not Cough: to point of gagging / vomiting  Sleeping: 8 in bed blocks does fine but says  can't lie flat for CT scan s sob/gagging SABA use: symbicort 80/ neb helps  02: none  Rec Pantoprazole (protonix) 40 mg   Take  30-60 min before first meal of the day and Pepcid (famotidine)  20 mg after supper until return to office - this is the best way to tell whether stomach acid is contributing to your problem.  GERD diet reviewed, bed blocks rec   Plan A = Automatic = Always=    symbicort 80 Take 2 puffs first thing in am and then another 2 puffs about 12 hours later.  Work on inhaler technique:  Plan B = Backup (to supplement plan A, not to replace it) Only use your albuterol inhaler as a rescue medication  Plan C = Crisis (instead of Plan B but only if Plan B stops working) - only use your albuterol nebulizer if you first try Plan B  Depomedrol 80 mg IM today Please schedule a follow up office visit in 4 weeks, sooner if needed  with all medications /inhalers/ solutions in hand    06/29/2022  f/u ov/Auden Wettstein re: ***   maint on ***  No chief complaint on file.   Dyspnea:  *** Cough: *** Sleeping: *** SABA use: *** 02: *** Covid status:   *** Lung cancer screening :  ***    No obvious day to day or daytime variability or assoc excess/ purulent sputum or mucus plugs or hemoptysis or cp or chest tightness, subjective wheeze or overt sinus or hb symptoms.   *** without nocturnal  or early am exacerbation  of respiratory  c/o's or need for noct saba. Also denies any obvious fluctuation of symptoms with weather or environmental changes or other aggravating or alleviating factors except as outlined above   No unusual exposure hx or h/o childhood pna/ asthma or knowledge of premature birth.  Current Allergies, Complete Past Medical History, Past Surgical History, Family History, and Social History were reviewed in [employer] record.  ROS  The following are not active complaints unless bolded Hoarseness, sore throat, dysphagia, dental problems, itching,  sneezing,  nasal congestion or discharge  of excess mucus or purulent secretions, ear ache,   fever, chills, sweats, unintended wt loss or wt gain, classically pleuritic or exertional cp,  orthopnea pnd or arm/hand swelling  or leg swelling, presyncope, palpitations, abdominal pain, anorexia, nausea, vomiting, diarrhea  or change in bowel habits or change in bladder habits, change in stools or change in urine, dysuria, hematuria,  rash, arthralgias, visual complaints, headache, numbness, weakness or ataxia or problems with walking or coordination,  change in mood or  memory.        No outpatient medications have been marked as taking for the 06/29/22 encounter (Appointment) with Tanda Rockers, MD.                  Past Medical History:  Diagnosis Date   Arthritis    COPD (chronic obstructive pulmonary disease) ([place])    Hyperlipidemia    Hypertension    Osteoporosis        Objective:    Wts  06/29/2022        ***  11/26/2021         299  09/16/2021       280   05/25/21 284 lb 3.2 oz (128.9 kg)  05/18/21 276 lb (125.2 kg)  05/04/21 270 lb (122.5 kg)          (flat did not cause gag but immediate sob) ***       Assessment

## 2022-07-01 ENCOUNTER — Inpatient Hospital Stay: Payer: Medicaid Other | Admitting: Family Medicine

## 2022-07-04 ENCOUNTER — Encounter: Payer: Self-pay | Admitting: Family Medicine

## 2022-07-04 ENCOUNTER — Ambulatory Visit: Payer: Medicaid Other | Admitting: Family Medicine

## 2022-07-04 VITALS — BP 132/66 | HR 113 | Ht 71.0 in | Wt 300.0 lb

## 2022-07-04 DIAGNOSIS — K7031 Alcoholic cirrhosis of liver with ascites: Secondary | ICD-10-CM | POA: Diagnosis not present

## 2022-07-04 DIAGNOSIS — M545 Low back pain, unspecified: Secondary | ICD-10-CM

## 2022-07-04 DIAGNOSIS — G8929 Other chronic pain: Secondary | ICD-10-CM | POA: Diagnosis not present

## 2022-07-04 DIAGNOSIS — M5136 Other intervertebral disc degeneration, lumbar region: Secondary | ICD-10-CM

## 2022-07-04 MED ORDER — SPIRONOLACTONE 25 MG PO TABS: 25.0000 mg | ORAL_TABLET | Freq: Every day | ORAL | 0 refills | Status: DC

## 2022-07-04 MED ORDER — FUROSEMIDE 40 MG PO TABS: 40.0000 mg | ORAL_TABLET | Freq: Every day | ORAL | 0 refills | Status: DC

## 2022-07-04 NOTE — Patient Instructions (Addendum)
Thank you for coming to the office today.  Tramadol at pharmacy, 2 refills remaining, max dose for you will be 1 pill 3 times a day.  Stop taking Chlorthalidone 25mg  fluid pill  START 2 NEW fluid pills  Furosemide Lasix 40mg  daily + Spironolactone 25mg  daily.  We will check blood and see you back in 2 weeks.  Keep me updated after the Liver doctor on Weds 1/17  Use RICE therapy: - R - Rest / relative rest with activity modification avoid overuse of joint - I - Ice packs (make sure you use a towel or sock / something to protect skin) - C - Compression with ACE wrap to apply pressure and reduce swelling allowing more support - E - Elevation - if significant swelling, lift leg above heart level (toes above your nose) to help reduce swelling, most helpful at night after day of being on your feet   Please schedule a Follow-up Appointment to: Return in about 2 weeks (around 07/18/2022) for 2 weeks Follow-up Liver (GI) / labs AFTER.  If you have any other questions or concerns, please feel free to call the office or send a message through [place]. You may also schedule an earlier appointment if necessary.  Additionally, you may be receiving a survey about your experience at our office within a few days to 1 week by e-mail or mail. We value your feedback.  Nobie Putnam, DO [place]

## 2022-07-04 NOTE — Progress Notes (Signed)
Subjective:    Patient ID: Tom [name], male    DOB: 08-20-1965, 57 y.o.   MRN: [id number]  Tom [name] is a 57 y.o. male presenting on 07/04/2022 for Hospitalization Follow-up   HPI  HOSPITAL FOLLOW-UP VISIT  Hospital/Location: [facility] Date of Admission: 06/22/22 Date of Discharge: 06/25/22 Transitions of care telephone call: Not completed.  Reason for Admission: edema, dyspnea, cirrhosis  - Hospital H&P and Discharge Summary have been reviewed - Patient presents today within 1 week after recent hospitalization. Brief summary of recent course, patient had symptoms of edema, dyspnea, hospitalized at [facility] treated for cirrhosis.  Upcoming [facility] Hepatology Liver specialist at [facility] on Weds 07/06/22  - Today reports overall has done well after discharge. Symptoms of edema still present   I have reviewed the discharge medication list, and have reconciled the current and discharge medications today.   Current Outpatient Medications:    albuterol (PROVENTIL) (2.5 MG/3ML) 0.083% nebulizer solution, Take 3 mLs (2.5 mg total) by nebulization every 4 (four) hours as needed for wheezing or shortness of breath., Disp: 75 mL, Rfl: 12   albuterol (VENTOLIN HFA) 108 (90 Base) MCG/ACT inhaler, INHALE 2 PUFFS BY MOUTH EVERY 4 HOURS AS NEEDED FOR WHEEZING FOR SHORTNESS OF BREATH, Disp: 18 g, Rfl: 3   alfuzosin (UROXATRAL) 10 MG 24 hr tablet, Take 1 tablet (10 mg total) by mouth daily with breakfast., Disp: 30 tablet, Rfl: 11   budesonide-formoterol (SYMBICORT) 80-4.5 MCG/ACT inhaler, Take 2 puffs first thing in am and then another 2 puffs about 12 hours later., Disp: 1 each, Rfl: 12   EPINEPHrine (EPIPEN 2-PAK) 0.3 mg/0.3 mL IJ SOAJ injection, Inject 0.3 mg into the muscle as needed for anaphylaxis., Disp: 2 each, Rfl: 1   famotidine (PEPCID) 20 MG tablet, Take 1 tablet (20 mg total) by mouth daily after supper. One after supper, Disp: 90 tablet, Rfl: 3    furosemide (LASIX) 40 MG tablet, Take 1 tablet (40 mg total) by mouth daily., Disp: 30 tablet, Rfl: 0   losartan (COZAAR) 25 MG tablet, Take 1 tablet (25 mg total) by mouth daily., Disp: 90 tablet, Rfl: 3   Multiple Vitamin (MULTIVITAMIN) capsule, Take 1 capsule by mouth daily., Disp: , Rfl:    pantoprazole (PROTONIX) 40 MG tablet, Take 1 tablet (40 mg total) by mouth daily before breakfast., Disp: 90 tablet, Rfl: 3   potassium chloride SA (KLOR-CON M) 20 MEQ tablet, Take 1 tablet (20 mEq total) by mouth daily., Disp: 90 tablet, Rfl: 3   spironolactone (ALDACTONE) 25 MG tablet, Take 1 tablet (25 mg total) by mouth daily., Disp: 30 tablet, Rfl: 0   atorvastatin (LIPITOR) 40 MG tablet, Take 1 tablet (40 mg total) by mouth daily. (Patient not taking: Reported on 07/04/2022), Disp: 90 tablet, Rfl: 3   buPROPion (WELLBUTRIN XL) 150 MG 24 hr tablet, bupropion HCl XL 150 mg 24 hr tablet, extended release  TAKE 1 TABLET (150 MG TOTAL) BY MOUTH IN THE MORNING, Disp: , Rfl:    DULoxetine (CYMBALTA) 20 MG capsule, Take 2 capsules by mouth daily. (Patient not taking: Reported on 07/04/2022), Disp: , Rfl:    tiZANidine (ZANAFLEX) 4 MG tablet, Take 1 tablet (4 mg total) by mouth every 8 (eight) hours as needed for muscle spasms., Disp: 90 tablet, Rfl: 3   traMADol (ULTRAM) 50 MG tablet, TAKE 1 TABLET BY MOUTH EVERY 8 HOURS AS NEEDED., Disp: 30 tablet, Rfl: 2  ------------------------------------------------------------------------- Social History   Tobacco Use  Smoking status: Former    Packs/day: 1.00    Years: 39.00    Total pack years: 39.00    Types: Cigarettes    Quit date: 12/2015    Years since quitting: 6.5   Smokeless tobacco: Never  Vaping Use   Vaping Use: Never used  Substance Use Topics   Alcohol use: No    Review of Systems Per HPI unless specifically indicated above     Objective:    BP 132/66   Pulse (!) 113   Ht 5\' 11"  (1.803 m)   Wt 300 lb (136.1 kg)   SpO2 99%   BMI 41.84  kg/m   Wt Readings from Last 3 Encounters:  07/04/22 300 lb (136.1 kg)  05/29/22 300 lb (136.1 kg)  03/21/22 300 lb (136.1 kg)    Physical Exam Vitals and nursing note reviewed.  Constitutional:      General: He is not in acute distress.    Appearance: He is well-developed. He is obese. He is not diaphoretic.     Comments: Well-appearing, comfortable, cooperative  HENT:     Head: Normocephalic and atraumatic.  Eyes:     General:        Right eye: No discharge.        Left eye: No discharge.     Conjunctiva/sclera: Conjunctivae normal.  Neck:     Thyroid: No thyromegaly.  Cardiovascular:     Rate and Rhythm: Normal rate and regular rhythm.     Pulses: Normal pulses.     Heart sounds: Normal heart sounds. No murmur heard. Pulmonary:     Effort: Pulmonary effort is normal. No respiratory distress.     Breath sounds: Normal breath sounds. No wheezing or rales.  Musculoskeletal:        General: Normal range of motion.     Cervical back: Normal range of motion and neck supple.     Right lower leg: Edema (2-3+ pitting edema bilateral w ascites) present.     Left lower leg: Edema present.  Lymphadenopathy:     Cervical: No cervical adenopathy.  Skin:    General: Skin is warm and dry.     Findings: No erythema or rash.  Neurological:     Mental Status: He is alert and oriented to person, place, and time. Mental status is at baseline.  Psychiatric:        Behavior: Behavior normal.     Comments: Well groomed, good eye contact, normal speech and thoughts      Cr 1.72 K 4.1 Albumin 2.1 Total protein 5.5 T Bilirubin 1.8 AST 46 ALT 21  Hgb 9.4 HCT 27.5 MCV 111.4 Platelet 101  05/21/22 Liver Doppler  Hepatomegaly with heterogenous nodular liver suggestive of chronic liver disease Moderate volume ascites with borderline splenomegaly which can be seen portal hypertension  Results for orders placed or performed during the hospital encounter of 12/07/21  Urinalysis, Complete  w Microscopic Urine, Random  Result Value Ref Range   Color, Urine AMBER (A) YELLOW   APPearance CLEAR CLEAR   Specific Gravity, Urine 1.015 1.005 - 1.030   pH 6.0 5.0 - 8.0   Glucose, UA NEGATIVE NEGATIVE mg/dL   Hgb urine dipstick NEGATIVE NEGATIVE   Bilirubin Urine SMALL (A) NEGATIVE   Ketones, ur NEGATIVE NEGATIVE mg/dL   Protein, ur NEGATIVE NEGATIVE mg/dL   Nitrite NEGATIVE NEGATIVE   Leukocytes,Ua NEGATIVE NEGATIVE   Squamous Epithelial / HPF NONE SEEN 0 - 5   WBC, UA 0-5  0 - 5 WBC/hpf   RBC / HPF 0-5 0 - 5 RBC/hpf   Bacteria, UA NONE SEEN NONE SEEN   Mucus PRESENT       Assessment & Plan:   Problem List Items Addressed This Visit     Alcoholic cirrhosis of liver with ascites ([place]) - Primary   Relevant Medications   furosemide (LASIX) 40 MG tablet   spironolactone (ALDACTONE) 25 MG tablet   Chronic back pain   DDD (degenerative disc disease), lumbar   HFU Alcoholic Liver Cirrhosis w/ ascites Hypoalbuminemia, Thrombocytosis  Awaiting [facility] apt upcoming w/ Hepatology in 2 days.  Tramadol at pharmacy, 2 refills remaining, max dose for you will be 1 pill 3 times a day. Caution with hepatic dosing, restricted to 50mg  THREE TIMES A DAY  Stop taking Chlorthalidone 25mg  fluid pill  START 2 NEW fluid pills Furosemide Lasix 40mg  daily + Spironolactone 25mg  daily.  We will check blood and see you back in 2 weeks. Chemistry panel.  Keep me updated after the Liver doctor on Weds 1/17    Meds ordered this encounter  Medications   furosemide (LASIX) 40 MG tablet    Sig: Take 1 tablet (40 mg total) by mouth daily.    Dispense:  30 tablet    Refill:  0   spironolactone (ALDACTONE) 25 MG tablet    Sig: Take 1 tablet (25 mg total) by mouth daily.    Dispense:  30 tablet    Refill:  0    Follow up plan: Return in about 2 weeks (around 07/18/2022) for 2 weeks Follow-up Liver (GI) / labs AFTER.   Nobie Putnam, [facility] 07/04/2022, 3:43 PM

## 2022-07-08 ENCOUNTER — Ambulatory Visit: Payer: Medicaid Other | Attending: Acute Care

## 2022-07-11 ENCOUNTER — Encounter: Payer: Medicaid Other | Admitting: Physical Therapy

## 2022-07-13 ENCOUNTER — Encounter: Payer: Medicaid Other | Admitting: Physical Therapy

## 2022-07-18 ENCOUNTER — Encounter: Payer: Medicaid Other | Admitting: Physical Therapy

## 2022-07-20 DIAGNOSIS — R34 Anuria and oliguria: Secondary | ICD-10-CM | POA: Diagnosis not present

## 2022-07-20 DIAGNOSIS — I2489 Other forms of acute ischemic heart disease: Secondary | ICD-10-CM | POA: Diagnosis not present

## 2022-07-20 DIAGNOSIS — E860 Dehydration: Secondary | ICD-10-CM | POA: Diagnosis not present

## 2022-07-20 DIAGNOSIS — Z20822 Contact with and (suspected) exposure to covid-19: Secondary | ICD-10-CM | POA: Diagnosis not present

## 2022-07-20 DIAGNOSIS — R6521 Severe sepsis with septic shock: Secondary | ICD-10-CM | POA: Diagnosis not present

## 2022-07-20 DIAGNOSIS — E870 Hyperosmolality and hypernatremia: Secondary | ICD-10-CM | POA: Diagnosis not present

## 2022-07-20 DIAGNOSIS — B370 Candidal stomatitis: Secondary | ICD-10-CM | POA: Diagnosis not present

## 2022-07-20 DIAGNOSIS — N179 Acute kidney failure, unspecified: Secondary | ICD-10-CM | POA: Diagnosis not present

## 2022-07-20 DIAGNOSIS — Z1152 Encounter for screening for COVID-19: Secondary | ICD-10-CM | POA: Diagnosis not present

## 2022-07-20 DIAGNOSIS — J810 Acute pulmonary edema: Secondary | ICD-10-CM | POA: Diagnosis not present

## 2022-07-20 DIAGNOSIS — A419 Sepsis, unspecified organism: Secondary | ICD-10-CM | POA: Diagnosis not present

## 2022-07-20 DIAGNOSIS — R0902 Hypoxemia: Secondary | ICD-10-CM | POA: Diagnosis not present

## 2022-07-20 DIAGNOSIS — J440 Chronic obstructive pulmonary disease with acute lower respiratory infection: Secondary | ICD-10-CM | POA: Diagnosis not present

## 2022-07-20 DIAGNOSIS — E8779 Other fluid overload: Secondary | ICD-10-CM | POA: Diagnosis not present

## 2022-07-20 DIAGNOSIS — J150 Pneumonia due to Klebsiella pneumoniae: Secondary | ICD-10-CM | POA: Diagnosis not present

## 2022-07-20 DIAGNOSIS — Z87891 Personal history of nicotine dependence: Secondary | ICD-10-CM | POA: Diagnosis not present

## 2022-07-20 DIAGNOSIS — F05 Delirium due to known physiological condition: Secondary | ICD-10-CM | POA: Diagnosis not present

## 2022-07-20 DIAGNOSIS — R0600 Dyspnea, unspecified: Secondary | ICD-10-CM | POA: Diagnosis not present

## 2022-07-20 DIAGNOSIS — E875 Hyperkalemia: Secondary | ICD-10-CM | POA: Diagnosis not present

## 2022-07-20 DIAGNOSIS — I471 Supraventricular tachycardia, unspecified: Secondary | ICD-10-CM | POA: Diagnosis not present

## 2022-07-20 DIAGNOSIS — E878 Other disorders of electrolyte and fluid balance, not elsewhere classified: Secondary | ICD-10-CM | POA: Diagnosis not present

## 2022-07-20 DIAGNOSIS — K567 Ileus, unspecified: Secondary | ICD-10-CM | POA: Diagnosis not present

## 2022-07-20 DIAGNOSIS — J101 Influenza due to other identified influenza virus with other respiratory manifestations: Secondary | ICD-10-CM | POA: Diagnosis not present

## 2022-07-20 DIAGNOSIS — K7682 Hepatic encephalopathy: Secondary | ICD-10-CM | POA: Diagnosis not present

## 2022-07-20 DIAGNOSIS — J9601 Acute respiratory failure with hypoxia: Secondary | ICD-10-CM | POA: Diagnosis not present

## 2022-07-20 DIAGNOSIS — E861 Hypovolemia: Secondary | ICD-10-CM | POA: Diagnosis not present

## 2022-07-20 DIAGNOSIS — J690 Pneumonitis due to inhalation of food and vomit: Secondary | ICD-10-CM | POA: Diagnosis not present

## 2022-07-20 DIAGNOSIS — E43 Unspecified severe protein-calorie malnutrition: Secondary | ICD-10-CM | POA: Diagnosis not present

## 2022-07-20 DIAGNOSIS — Z1624 Resistance to multiple antibiotics: Secondary | ICD-10-CM | POA: Diagnosis not present

## 2022-07-21 ENCOUNTER — Encounter: Payer: Medicaid Other | Admitting: Physical Therapy

## 2022-07-21 DIAGNOSIS — R918 Other nonspecific abnormal finding of lung field: Secondary | ICD-10-CM | POA: Diagnosis not present

## 2022-07-21 DIAGNOSIS — I471 Supraventricular tachycardia, unspecified: Secondary | ICD-10-CM | POA: Diagnosis not present

## 2022-07-21 DIAGNOSIS — K219 Gastro-esophageal reflux disease without esophagitis: Secondary | ICD-10-CM | POA: Diagnosis not present

## 2022-07-21 DIAGNOSIS — I10 Essential (primary) hypertension: Secondary | ICD-10-CM | POA: Diagnosis not present

## 2022-07-21 DIAGNOSIS — L299 Pruritus, unspecified: Secondary | ICD-10-CM | POA: Diagnosis not present

## 2022-07-21 DIAGNOSIS — J101 Influenza due to other identified influenza virus with other respiratory manifestations: Secondary | ICD-10-CM | POA: Diagnosis not present

## 2022-07-21 DIAGNOSIS — J9601 Acute respiratory failure with hypoxia: Secondary | ICD-10-CM | POA: Diagnosis not present

## 2022-07-21 DIAGNOSIS — R42 Dizziness and giddiness: Secondary | ICD-10-CM | POA: Diagnosis not present

## 2022-07-21 DIAGNOSIS — J449 Chronic obstructive pulmonary disease, unspecified: Secondary | ICD-10-CM | POA: Diagnosis not present

## 2022-07-21 DIAGNOSIS — R0602 Shortness of breath: Secondary | ICD-10-CM | POA: Diagnosis not present

## 2022-07-21 DIAGNOSIS — M545 Low back pain, unspecified: Secondary | ICD-10-CM | POA: Diagnosis not present

## 2022-07-21 DIAGNOSIS — Z79899 Other long term (current) drug therapy: Secondary | ICD-10-CM | POA: Diagnosis not present

## 2022-07-21 DIAGNOSIS — R7989 Other specified abnormal findings of blood chemistry: Secondary | ICD-10-CM | POA: Diagnosis not present

## 2022-07-21 DIAGNOSIS — G8929 Other chronic pain: Secondary | ICD-10-CM | POA: Diagnosis not present

## 2022-07-22 DIAGNOSIS — K219 Gastro-esophageal reflux disease without esophagitis: Secondary | ICD-10-CM | POA: Diagnosis not present

## 2022-07-22 DIAGNOSIS — L299 Pruritus, unspecified: Secondary | ICD-10-CM | POA: Diagnosis not present

## 2022-07-22 DIAGNOSIS — G8929 Other chronic pain: Secondary | ICD-10-CM | POA: Diagnosis not present

## 2022-07-22 DIAGNOSIS — R059 Cough, unspecified: Secondary | ICD-10-CM | POA: Diagnosis not present

## 2022-07-22 DIAGNOSIS — R7989 Other specified abnormal findings of blood chemistry: Secondary | ICD-10-CM | POA: Diagnosis not present

## 2022-07-22 DIAGNOSIS — J9601 Acute respiratory failure with hypoxia: Secondary | ICD-10-CM | POA: Diagnosis not present

## 2022-07-22 DIAGNOSIS — J449 Chronic obstructive pulmonary disease, unspecified: Secondary | ICD-10-CM | POA: Diagnosis not present

## 2022-07-22 DIAGNOSIS — Z79899 Other long term (current) drug therapy: Secondary | ICD-10-CM | POA: Diagnosis not present

## 2022-07-22 DIAGNOSIS — J984 Other disorders of lung: Secondary | ICD-10-CM | POA: Diagnosis not present

## 2022-07-22 DIAGNOSIS — Z792 Long term (current) use of antibiotics: Secondary | ICD-10-CM | POA: Diagnosis not present

## 2022-07-22 DIAGNOSIS — I471 Supraventricular tachycardia, unspecified: Secondary | ICD-10-CM | POA: Diagnosis not present

## 2022-07-22 DIAGNOSIS — M545 Low back pain, unspecified: Secondary | ICD-10-CM | POA: Diagnosis not present

## 2022-07-22 DIAGNOSIS — I10 Essential (primary) hypertension: Secondary | ICD-10-CM | POA: Diagnosis not present

## 2022-07-23 DIAGNOSIS — J449 Chronic obstructive pulmonary disease, unspecified: Secondary | ICD-10-CM | POA: Diagnosis not present

## 2022-07-23 DIAGNOSIS — R7989 Other specified abnormal findings of blood chemistry: Secondary | ICD-10-CM | POA: Diagnosis not present

## 2022-07-23 DIAGNOSIS — K219 Gastro-esophageal reflux disease without esophagitis: Secondary | ICD-10-CM | POA: Diagnosis not present

## 2022-07-23 DIAGNOSIS — J9601 Acute respiratory failure with hypoxia: Secondary | ICD-10-CM | POA: Diagnosis not present

## 2022-07-23 DIAGNOSIS — Z792 Long term (current) use of antibiotics: Secondary | ICD-10-CM | POA: Diagnosis not present

## 2022-07-23 DIAGNOSIS — M545 Low back pain, unspecified: Secondary | ICD-10-CM | POA: Diagnosis not present

## 2022-07-23 DIAGNOSIS — I471 Supraventricular tachycardia, unspecified: Secondary | ICD-10-CM | POA: Diagnosis not present

## 2022-07-23 DIAGNOSIS — L299 Pruritus, unspecified: Secondary | ICD-10-CM | POA: Diagnosis not present

## 2022-07-23 DIAGNOSIS — G8929 Other chronic pain: Secondary | ICD-10-CM | POA: Diagnosis not present

## 2022-07-23 DIAGNOSIS — Z79899 Other long term (current) drug therapy: Secondary | ICD-10-CM | POA: Diagnosis not present

## 2022-07-23 DIAGNOSIS — I10 Essential (primary) hypertension: Secondary | ICD-10-CM | POA: Diagnosis not present

## 2022-07-24 DIAGNOSIS — J9601 Acute respiratory failure with hypoxia: Secondary | ICD-10-CM | POA: Diagnosis not present

## 2022-07-24 DIAGNOSIS — G8929 Other chronic pain: Secondary | ICD-10-CM | POA: Diagnosis not present

## 2022-07-24 DIAGNOSIS — M545 Low back pain, unspecified: Secondary | ICD-10-CM | POA: Diagnosis not present

## 2022-07-24 DIAGNOSIS — Z792 Long term (current) use of antibiotics: Secondary | ICD-10-CM | POA: Diagnosis not present

## 2022-07-24 DIAGNOSIS — J449 Chronic obstructive pulmonary disease, unspecified: Secondary | ICD-10-CM | POA: Diagnosis not present

## 2022-07-24 DIAGNOSIS — Z79899 Other long term (current) drug therapy: Secondary | ICD-10-CM | POA: Diagnosis not present

## 2022-07-24 DIAGNOSIS — R7989 Other specified abnormal findings of blood chemistry: Secondary | ICD-10-CM | POA: Diagnosis not present

## 2022-07-24 DIAGNOSIS — I471 Supraventricular tachycardia, unspecified: Secondary | ICD-10-CM | POA: Diagnosis not present

## 2022-07-24 DIAGNOSIS — K219 Gastro-esophageal reflux disease without esophagitis: Secondary | ICD-10-CM | POA: Diagnosis not present

## 2022-07-24 DIAGNOSIS — I10 Essential (primary) hypertension: Secondary | ICD-10-CM | POA: Diagnosis not present

## 2022-07-24 DIAGNOSIS — L299 Pruritus, unspecified: Secondary | ICD-10-CM | POA: Diagnosis not present

## 2022-07-25 ENCOUNTER — Encounter: Payer: Medicaid Other | Admitting: Physical Therapy

## 2022-07-25 DIAGNOSIS — R0902 Hypoxemia: Secondary | ICD-10-CM | POA: Diagnosis not present

## 2022-07-25 DIAGNOSIS — I471 Supraventricular tachycardia, unspecified: Secondary | ICD-10-CM | POA: Diagnosis not present

## 2022-07-25 DIAGNOSIS — R918 Other nonspecific abnormal finding of lung field: Secondary | ICD-10-CM | POA: Diagnosis not present

## 2022-07-25 DIAGNOSIS — K219 Gastro-esophageal reflux disease without esophagitis: Secondary | ICD-10-CM | POA: Diagnosis not present

## 2022-07-25 DIAGNOSIS — G8929 Other chronic pain: Secondary | ICD-10-CM | POA: Diagnosis not present

## 2022-07-25 DIAGNOSIS — M545 Low back pain, unspecified: Secondary | ICD-10-CM | POA: Diagnosis not present

## 2022-07-25 DIAGNOSIS — I10 Essential (primary) hypertension: Secondary | ICD-10-CM | POA: Diagnosis not present

## 2022-07-25 DIAGNOSIS — Z792 Long term (current) use of antibiotics: Secondary | ICD-10-CM | POA: Diagnosis not present

## 2022-07-25 DIAGNOSIS — R7989 Other specified abnormal findings of blood chemistry: Secondary | ICD-10-CM | POA: Diagnosis not present

## 2022-07-25 DIAGNOSIS — Z79899 Other long term (current) drug therapy: Secondary | ICD-10-CM | POA: Diagnosis not present

## 2022-07-25 DIAGNOSIS — J9601 Acute respiratory failure with hypoxia: Secondary | ICD-10-CM | POA: Diagnosis not present

## 2022-07-25 DIAGNOSIS — L299 Pruritus, unspecified: Secondary | ICD-10-CM | POA: Diagnosis not present

## 2022-07-25 DIAGNOSIS — J449 Chronic obstructive pulmonary disease, unspecified: Secondary | ICD-10-CM | POA: Diagnosis not present

## 2022-07-26 DIAGNOSIS — J9601 Acute respiratory failure with hypoxia: Secondary | ICD-10-CM | POA: Diagnosis not present

## 2022-07-26 DIAGNOSIS — L299 Pruritus, unspecified: Secondary | ICD-10-CM | POA: Diagnosis not present

## 2022-07-26 DIAGNOSIS — K7460 Unspecified cirrhosis of liver: Secondary | ICD-10-CM | POA: Diagnosis not present

## 2022-07-26 DIAGNOSIS — J189 Pneumonia, unspecified organism: Secondary | ICD-10-CM | POA: Diagnosis not present

## 2022-07-26 DIAGNOSIS — J449 Chronic obstructive pulmonary disease, unspecified: Secondary | ICD-10-CM | POA: Diagnosis not present

## 2022-07-26 DIAGNOSIS — J111 Influenza due to unidentified influenza virus with other respiratory manifestations: Secondary | ICD-10-CM | POA: Diagnosis not present

## 2022-07-26 DIAGNOSIS — G8929 Other chronic pain: Secondary | ICD-10-CM | POA: Diagnosis not present

## 2022-07-26 DIAGNOSIS — R7989 Other specified abnormal findings of blood chemistry: Secondary | ICD-10-CM | POA: Diagnosis not present

## 2022-07-26 DIAGNOSIS — J9811 Atelectasis: Secondary | ICD-10-CM | POA: Diagnosis not present

## 2022-07-26 DIAGNOSIS — Z7951 Long term (current) use of inhaled steroids: Secondary | ICD-10-CM | POA: Diagnosis not present

## 2022-07-26 DIAGNOSIS — M545 Low back pain, unspecified: Secondary | ICD-10-CM | POA: Diagnosis not present

## 2022-07-26 DIAGNOSIS — Z792 Long term (current) use of antibiotics: Secondary | ICD-10-CM | POA: Diagnosis not present

## 2022-07-26 DIAGNOSIS — K219 Gastro-esophageal reflux disease without esophagitis: Secondary | ICD-10-CM | POA: Diagnosis not present

## 2022-07-26 DIAGNOSIS — R918 Other nonspecific abnormal finding of lung field: Secondary | ICD-10-CM | POA: Diagnosis not present

## 2022-07-26 DIAGNOSIS — I471 Supraventricular tachycardia, unspecified: Secondary | ICD-10-CM | POA: Diagnosis not present

## 2022-07-26 DIAGNOSIS — D649 Anemia, unspecified: Secondary | ICD-10-CM | POA: Diagnosis not present

## 2022-07-26 DIAGNOSIS — I10 Essential (primary) hypertension: Secondary | ICD-10-CM | POA: Diagnosis not present

## 2022-07-27 DIAGNOSIS — Z792 Long term (current) use of antibiotics: Secondary | ICD-10-CM | POA: Diagnosis not present

## 2022-07-27 DIAGNOSIS — K7460 Unspecified cirrhosis of liver: Secondary | ICD-10-CM | POA: Diagnosis not present

## 2022-07-27 DIAGNOSIS — J189 Pneumonia, unspecified organism: Secondary | ICD-10-CM | POA: Diagnosis not present

## 2022-07-27 DIAGNOSIS — J1100 Influenza due to unidentified influenza virus with unspecified type of pneumonia: Secondary | ICD-10-CM | POA: Diagnosis not present

## 2022-07-27 DIAGNOSIS — J111 Influenza due to unidentified influenza virus with other respiratory manifestations: Secondary | ICD-10-CM | POA: Diagnosis not present

## 2022-07-27 DIAGNOSIS — J449 Chronic obstructive pulmonary disease, unspecified: Secondary | ICD-10-CM | POA: Diagnosis not present

## 2022-07-27 DIAGNOSIS — M545 Low back pain, unspecified: Secondary | ICD-10-CM | POA: Diagnosis not present

## 2022-07-27 DIAGNOSIS — J9601 Acute respiratory failure with hypoxia: Secondary | ICD-10-CM | POA: Diagnosis not present

## 2022-07-27 DIAGNOSIS — G8929 Other chronic pain: Secondary | ICD-10-CM | POA: Diagnosis not present

## 2022-07-27 DIAGNOSIS — I471 Supraventricular tachycardia, unspecified: Secondary | ICD-10-CM | POA: Diagnosis not present

## 2022-07-27 DIAGNOSIS — I10 Essential (primary) hypertension: Secondary | ICD-10-CM | POA: Diagnosis not present

## 2022-07-27 DIAGNOSIS — R7989 Other specified abnormal findings of blood chemistry: Secondary | ICD-10-CM | POA: Diagnosis not present

## 2022-07-27 DIAGNOSIS — K219 Gastro-esophageal reflux disease without esophagitis: Secondary | ICD-10-CM | POA: Diagnosis not present

## 2022-07-27 DIAGNOSIS — D6489 Other specified anemias: Secondary | ICD-10-CM | POA: Diagnosis not present

## 2022-07-27 DIAGNOSIS — L299 Pruritus, unspecified: Secondary | ICD-10-CM | POA: Diagnosis not present

## 2022-07-28 ENCOUNTER — Encounter: Payer: Medicaid Other | Admitting: Physical Therapy

## 2022-07-28 DIAGNOSIS — K7460 Unspecified cirrhosis of liver: Secondary | ICD-10-CM | POA: Diagnosis not present

## 2022-07-28 DIAGNOSIS — Z4682 Encounter for fitting and adjustment of non-vascular catheter: Secondary | ICD-10-CM | POA: Diagnosis not present

## 2022-07-28 DIAGNOSIS — J90 Pleural effusion, not elsewhere classified: Secondary | ICD-10-CM | POA: Diagnosis not present

## 2022-07-28 DIAGNOSIS — J101 Influenza due to other identified influenza virus with other respiratory manifestations: Secondary | ICD-10-CM | POA: Diagnosis not present

## 2022-07-28 DIAGNOSIS — Z452 Encounter for adjustment and management of vascular access device: Secondary | ICD-10-CM | POA: Diagnosis not present

## 2022-07-28 DIAGNOSIS — R109 Unspecified abdominal pain: Secondary | ICD-10-CM | POA: Diagnosis not present

## 2022-07-28 DIAGNOSIS — D649 Anemia, unspecified: Secondary | ICD-10-CM | POA: Diagnosis not present

## 2022-07-28 DIAGNOSIS — J189 Pneumonia, unspecified organism: Secondary | ICD-10-CM | POA: Diagnosis not present

## 2022-07-28 DIAGNOSIS — E8770 Fluid overload, unspecified: Secondary | ICD-10-CM | POA: Diagnosis not present

## 2022-07-28 DIAGNOSIS — I10 Essential (primary) hypertension: Secondary | ICD-10-CM | POA: Diagnosis not present

## 2022-07-28 DIAGNOSIS — J111 Influenza due to unidentified influenza virus with other respiratory manifestations: Secondary | ICD-10-CM | POA: Diagnosis not present

## 2022-07-28 DIAGNOSIS — J9601 Acute respiratory failure with hypoxia: Secondary | ICD-10-CM | POA: Diagnosis not present

## 2022-07-29 DIAGNOSIS — I959 Hypotension, unspecified: Secondary | ICD-10-CM | POA: Diagnosis not present

## 2022-07-29 DIAGNOSIS — Z9911 Dependence on respirator [ventilator] status: Secondary | ICD-10-CM | POA: Diagnosis not present

## 2022-07-29 DIAGNOSIS — Z79899 Other long term (current) drug therapy: Secondary | ICD-10-CM | POA: Diagnosis not present

## 2022-07-29 DIAGNOSIS — Z452 Encounter for adjustment and management of vascular access device: Secondary | ICD-10-CM | POA: Diagnosis not present

## 2022-07-29 DIAGNOSIS — R918 Other nonspecific abnormal finding of lung field: Secondary | ICD-10-CM | POA: Diagnosis not present

## 2022-07-29 DIAGNOSIS — K7460 Unspecified cirrhosis of liver: Secondary | ICD-10-CM | POA: Diagnosis not present

## 2022-07-29 DIAGNOSIS — E8770 Fluid overload, unspecified: Secondary | ICD-10-CM | POA: Diagnosis not present

## 2022-07-29 DIAGNOSIS — Z4682 Encounter for fitting and adjustment of non-vascular catheter: Secondary | ICD-10-CM | POA: Diagnosis not present

## 2022-07-29 DIAGNOSIS — J449 Chronic obstructive pulmonary disease, unspecified: Secondary | ICD-10-CM | POA: Diagnosis not present

## 2022-07-29 DIAGNOSIS — J111 Influenza due to unidentified influenza virus with other respiratory manifestations: Secondary | ICD-10-CM | POA: Diagnosis not present

## 2022-07-29 DIAGNOSIS — A419 Sepsis, unspecified organism: Secondary | ICD-10-CM | POA: Diagnosis not present

## 2022-07-29 DIAGNOSIS — K652 Spontaneous bacterial peritonitis: Secondary | ICD-10-CM | POA: Diagnosis not present

## 2022-07-29 DIAGNOSIS — N179 Acute kidney failure, unspecified: Secondary | ICD-10-CM | POA: Diagnosis not present

## 2022-07-29 DIAGNOSIS — R6521 Severe sepsis with septic shock: Secondary | ICD-10-CM | POA: Diagnosis not present

## 2022-07-29 DIAGNOSIS — J811 Chronic pulmonary edema: Secondary | ICD-10-CM | POA: Diagnosis not present

## 2022-07-29 DIAGNOSIS — J984 Other disorders of lung: Secondary | ICD-10-CM | POA: Diagnosis not present

## 2022-07-29 DIAGNOSIS — R0902 Hypoxemia: Secondary | ICD-10-CM | POA: Diagnosis not present

## 2022-07-29 DIAGNOSIS — K219 Gastro-esophageal reflux disease without esophagitis: Secondary | ICD-10-CM | POA: Diagnosis not present

## 2022-07-29 DIAGNOSIS — J101 Influenza due to other identified influenza virus with other respiratory manifestations: Secondary | ICD-10-CM | POA: Diagnosis not present

## 2022-07-29 DIAGNOSIS — I2789 Other specified pulmonary heart diseases: Secondary | ICD-10-CM | POA: Diagnosis not present

## 2022-07-29 DIAGNOSIS — Z792 Long term (current) use of antibiotics: Secondary | ICD-10-CM | POA: Diagnosis not present

## 2022-07-29 DIAGNOSIS — J189 Pneumonia, unspecified organism: Secondary | ICD-10-CM | POA: Diagnosis not present

## 2022-07-29 DIAGNOSIS — J9601 Acute respiratory failure with hypoxia: Secondary | ICD-10-CM | POA: Diagnosis not present

## 2022-07-29 DIAGNOSIS — D649 Anemia, unspecified: Secondary | ICD-10-CM | POA: Diagnosis not present

## 2022-07-30 DIAGNOSIS — N179 Acute kidney failure, unspecified: Secondary | ICD-10-CM | POA: Diagnosis not present

## 2022-07-30 DIAGNOSIS — J9601 Acute respiratory failure with hypoxia: Secondary | ICD-10-CM | POA: Diagnosis not present

## 2022-07-30 DIAGNOSIS — I959 Hypotension, unspecified: Secondary | ICD-10-CM | POA: Diagnosis not present

## 2022-07-30 DIAGNOSIS — I471 Supraventricular tachycardia, unspecified: Secondary | ICD-10-CM | POA: Diagnosis not present

## 2022-07-30 DIAGNOSIS — K7681 Hepatopulmonary syndrome: Secondary | ICD-10-CM | POA: Diagnosis not present

## 2022-07-30 DIAGNOSIS — J810 Acute pulmonary edema: Secondary | ICD-10-CM | POA: Diagnosis not present

## 2022-07-30 DIAGNOSIS — J09X2 Influenza due to identified novel influenza A virus with other respiratory manifestations: Secondary | ICD-10-CM | POA: Diagnosis not present

## 2022-07-30 DIAGNOSIS — D72829 Elevated white blood cell count, unspecified: Secondary | ICD-10-CM | POA: Diagnosis not present

## 2022-07-30 DIAGNOSIS — J189 Pneumonia, unspecified organism: Secondary | ICD-10-CM | POA: Diagnosis not present

## 2022-07-30 DIAGNOSIS — Z9911 Dependence on respirator [ventilator] status: Secondary | ICD-10-CM | POA: Diagnosis not present

## 2022-07-30 DIAGNOSIS — I2489 Other forms of acute ischemic heart disease: Secondary | ICD-10-CM | POA: Diagnosis not present

## 2022-07-31 DIAGNOSIS — J9601 Acute respiratory failure with hypoxia: Secondary | ICD-10-CM | POA: Diagnosis not present

## 2022-07-31 DIAGNOSIS — R140 Abdominal distension (gaseous): Secondary | ICD-10-CM | POA: Diagnosis not present

## 2022-07-31 DIAGNOSIS — M7989 Other specified soft tissue disorders: Secondary | ICD-10-CM | POA: Diagnosis not present

## 2022-07-31 DIAGNOSIS — Z9911 Dependence on respirator [ventilator] status: Secondary | ICD-10-CM | POA: Diagnosis not present

## 2022-07-31 DIAGNOSIS — J690 Pneumonitis due to inhalation of food and vomit: Secondary | ICD-10-CM | POA: Diagnosis not present

## 2022-07-31 DIAGNOSIS — I2489 Other forms of acute ischemic heart disease: Secondary | ICD-10-CM | POA: Diagnosis not present

## 2022-07-31 DIAGNOSIS — D72829 Elevated white blood cell count, unspecified: Secondary | ICD-10-CM | POA: Diagnosis not present

## 2022-07-31 DIAGNOSIS — K7681 Hepatopulmonary syndrome: Secondary | ICD-10-CM | POA: Diagnosis not present

## 2022-07-31 DIAGNOSIS — J09X2 Influenza due to identified novel influenza A virus with other respiratory manifestations: Secondary | ICD-10-CM | POA: Diagnosis not present

## 2022-07-31 DIAGNOSIS — R188 Other ascites: Secondary | ICD-10-CM | POA: Diagnosis not present

## 2022-07-31 DIAGNOSIS — I959 Hypotension, unspecified: Secondary | ICD-10-CM | POA: Diagnosis not present

## 2022-07-31 DIAGNOSIS — N179 Acute kidney failure, unspecified: Secondary | ICD-10-CM | POA: Diagnosis not present

## 2022-07-31 DIAGNOSIS — I471 Supraventricular tachycardia, unspecified: Secondary | ICD-10-CM | POA: Diagnosis not present

## 2022-08-01 ENCOUNTER — Encounter: Payer: Medicaid Other | Admitting: Physical Therapy

## 2022-08-01 DIAGNOSIS — K7460 Unspecified cirrhosis of liver: Secondary | ICD-10-CM | POA: Diagnosis not present

## 2022-08-01 DIAGNOSIS — J9601 Acute respiratory failure with hypoxia: Secondary | ICD-10-CM | POA: Diagnosis not present

## 2022-08-01 DIAGNOSIS — Z9911 Dependence on respirator [ventilator] status: Secondary | ICD-10-CM | POA: Diagnosis not present

## 2022-08-01 DIAGNOSIS — K838 Other specified diseases of biliary tract: Secondary | ICD-10-CM | POA: Diagnosis not present

## 2022-08-01 DIAGNOSIS — Z4682 Encounter for fitting and adjustment of non-vascular catheter: Secondary | ICD-10-CM | POA: Diagnosis not present

## 2022-08-01 DIAGNOSIS — D72829 Elevated white blood cell count, unspecified: Secondary | ICD-10-CM | POA: Diagnosis not present

## 2022-08-01 DIAGNOSIS — I471 Supraventricular tachycardia, unspecified: Secondary | ICD-10-CM | POA: Diagnosis not present

## 2022-08-01 DIAGNOSIS — I959 Hypotension, unspecified: Secondary | ICD-10-CM | POA: Diagnosis not present

## 2022-08-01 DIAGNOSIS — K7681 Hepatopulmonary syndrome: Secondary | ICD-10-CM | POA: Diagnosis not present

## 2022-08-01 DIAGNOSIS — J690 Pneumonitis due to inhalation of food and vomit: Secondary | ICD-10-CM | POA: Diagnosis not present

## 2022-08-01 DIAGNOSIS — I2489 Other forms of acute ischemic heart disease: Secondary | ICD-10-CM | POA: Diagnosis not present

## 2022-08-01 DIAGNOSIS — N179 Acute kidney failure, unspecified: Secondary | ICD-10-CM | POA: Diagnosis not present

## 2022-08-01 DIAGNOSIS — J09X2 Influenza due to identified novel influenza A virus with other respiratory manifestations: Secondary | ICD-10-CM | POA: Diagnosis not present

## 2022-08-01 DIAGNOSIS — R188 Other ascites: Secondary | ICD-10-CM | POA: Diagnosis not present

## 2022-08-02 DIAGNOSIS — D72829 Elevated white blood cell count, unspecified: Secondary | ICD-10-CM | POA: Diagnosis not present

## 2022-08-02 DIAGNOSIS — I2489 Other forms of acute ischemic heart disease: Secondary | ICD-10-CM | POA: Diagnosis not present

## 2022-08-02 DIAGNOSIS — K6389 Other specified diseases of intestine: Secondary | ICD-10-CM | POA: Diagnosis not present

## 2022-08-02 DIAGNOSIS — J9601 Acute respiratory failure with hypoxia: Secondary | ICD-10-CM | POA: Diagnosis not present

## 2022-08-02 DIAGNOSIS — N179 Acute kidney failure, unspecified: Secondary | ICD-10-CM | POA: Diagnosis not present

## 2022-08-02 DIAGNOSIS — K7681 Hepatopulmonary syndrome: Secondary | ICD-10-CM | POA: Diagnosis not present

## 2022-08-02 DIAGNOSIS — J09X2 Influenza due to identified novel influenza A virus with other respiratory manifestations: Secondary | ICD-10-CM | POA: Diagnosis not present

## 2022-08-02 DIAGNOSIS — F05 Delirium due to known physiological condition: Secondary | ICD-10-CM | POA: Diagnosis not present

## 2022-08-02 DIAGNOSIS — J690 Pneumonitis due to inhalation of food and vomit: Secondary | ICD-10-CM | POA: Diagnosis not present

## 2022-08-02 DIAGNOSIS — I471 Supraventricular tachycardia, unspecified: Secondary | ICD-10-CM | POA: Diagnosis not present

## 2022-08-02 DIAGNOSIS — Z9911 Dependence on respirator [ventilator] status: Secondary | ICD-10-CM | POA: Diagnosis not present

## 2022-08-02 DIAGNOSIS — I959 Hypotension, unspecified: Secondary | ICD-10-CM | POA: Diagnosis not present

## 2022-08-03 DIAGNOSIS — J101 Influenza due to other identified influenza virus with other respiratory manifestations: Secondary | ICD-10-CM | POA: Diagnosis not present

## 2022-08-03 DIAGNOSIS — I959 Hypotension, unspecified: Secondary | ICD-10-CM | POA: Diagnosis not present

## 2022-08-03 DIAGNOSIS — D72829 Elevated white blood cell count, unspecified: Secondary | ICD-10-CM | POA: Diagnosis not present

## 2022-08-03 DIAGNOSIS — K7681 Hepatopulmonary syndrome: Secondary | ICD-10-CM | POA: Diagnosis not present

## 2022-08-03 DIAGNOSIS — Z4682 Encounter for fitting and adjustment of non-vascular catheter: Secondary | ICD-10-CM | POA: Diagnosis not present

## 2022-08-03 DIAGNOSIS — N179 Acute kidney failure, unspecified: Secondary | ICD-10-CM | POA: Diagnosis not present

## 2022-08-03 DIAGNOSIS — J9601 Acute respiratory failure with hypoxia: Secondary | ICD-10-CM | POA: Diagnosis not present

## 2022-08-03 DIAGNOSIS — I2489 Other forms of acute ischemic heart disease: Secondary | ICD-10-CM | POA: Diagnosis not present

## 2022-08-03 DIAGNOSIS — F05 Delirium due to known physiological condition: Secondary | ICD-10-CM | POA: Diagnosis not present

## 2022-08-03 DIAGNOSIS — Z9911 Dependence on respirator [ventilator] status: Secondary | ICD-10-CM | POA: Diagnosis not present

## 2022-08-03 DIAGNOSIS — J09X2 Influenza due to identified novel influenza A virus with other respiratory manifestations: Secondary | ICD-10-CM | POA: Diagnosis not present

## 2022-08-03 DIAGNOSIS — K6389 Other specified diseases of intestine: Secondary | ICD-10-CM | POA: Diagnosis not present

## 2022-08-03 DIAGNOSIS — J690 Pneumonitis due to inhalation of food and vomit: Secondary | ICD-10-CM | POA: Diagnosis not present

## 2022-08-03 DIAGNOSIS — I471 Supraventricular tachycardia, unspecified: Secondary | ICD-10-CM | POA: Diagnosis not present

## 2022-08-04 ENCOUNTER — Encounter: Payer: Medicaid Other | Admitting: Physical Therapy

## 2022-08-04 DIAGNOSIS — K7682 Hepatic encephalopathy: Secondary | ICD-10-CM | POA: Diagnosis not present

## 2022-08-04 DIAGNOSIS — I471 Supraventricular tachycardia, unspecified: Secondary | ICD-10-CM | POA: Diagnosis not present

## 2022-08-04 DIAGNOSIS — N179 Acute kidney failure, unspecified: Secondary | ICD-10-CM | POA: Diagnosis not present

## 2022-08-04 DIAGNOSIS — J9601 Acute respiratory failure with hypoxia: Secondary | ICD-10-CM | POA: Diagnosis not present

## 2022-08-04 DIAGNOSIS — K219 Gastro-esophageal reflux disease without esophagitis: Secondary | ICD-10-CM | POA: Diagnosis not present

## 2022-08-04 DIAGNOSIS — I959 Hypotension, unspecified: Secondary | ICD-10-CM | POA: Diagnosis not present

## 2022-08-04 DIAGNOSIS — K767 Hepatorenal syndrome: Secondary | ICD-10-CM | POA: Diagnosis not present

## 2022-08-04 DIAGNOSIS — K6389 Other specified diseases of intestine: Secondary | ICD-10-CM | POA: Diagnosis not present

## 2022-08-04 DIAGNOSIS — I2489 Other forms of acute ischemic heart disease: Secondary | ICD-10-CM | POA: Diagnosis not present

## 2022-08-04 DIAGNOSIS — D72829 Elevated white blood cell count, unspecified: Secondary | ICD-10-CM | POA: Diagnosis not present

## 2022-08-04 DIAGNOSIS — J09X2 Influenza due to identified novel influenza A virus with other respiratory manifestations: Secondary | ICD-10-CM | POA: Diagnosis not present

## 2022-08-04 DIAGNOSIS — F05 Delirium due to known physiological condition: Secondary | ICD-10-CM | POA: Diagnosis not present

## 2022-08-05 DIAGNOSIS — J9601 Acute respiratory failure with hypoxia: Secondary | ICD-10-CM | POA: Diagnosis not present

## 2022-08-05 DIAGNOSIS — R0603 Acute respiratory distress: Secondary | ICD-10-CM | POA: Diagnosis not present

## 2022-08-05 DIAGNOSIS — R0902 Hypoxemia: Secondary | ICD-10-CM | POA: Diagnosis not present

## 2022-08-05 DIAGNOSIS — I10 Essential (primary) hypertension: Secondary | ICD-10-CM | POA: Diagnosis not present

## 2022-08-05 DIAGNOSIS — J449 Chronic obstructive pulmonary disease, unspecified: Secondary | ICD-10-CM | POA: Diagnosis not present

## 2022-08-05 DIAGNOSIS — R109 Unspecified abdominal pain: Secondary | ICD-10-CM | POA: Diagnosis not present

## 2022-08-06 DIAGNOSIS — A419 Sepsis, unspecified organism: Secondary | ICD-10-CM | POA: Diagnosis not present

## 2022-08-06 DIAGNOSIS — R918 Other nonspecific abnormal finding of lung field: Secondary | ICD-10-CM | POA: Diagnosis not present

## 2022-08-06 DIAGNOSIS — Z9911 Dependence on respirator [ventilator] status: Secondary | ICD-10-CM | POA: Diagnosis not present

## 2022-08-06 DIAGNOSIS — J90 Pleural effusion, not elsewhere classified: Secondary | ICD-10-CM | POA: Diagnosis not present

## 2022-08-06 DIAGNOSIS — K7460 Unspecified cirrhosis of liver: Secondary | ICD-10-CM | POA: Diagnosis not present

## 2022-08-06 DIAGNOSIS — J189 Pneumonia, unspecified organism: Secondary | ICD-10-CM | POA: Diagnosis not present

## 2022-08-06 DIAGNOSIS — R109 Unspecified abdominal pain: Secondary | ICD-10-CM | POA: Diagnosis not present

## 2022-08-06 DIAGNOSIS — J101 Influenza due to other identified influenza virus with other respiratory manifestations: Secondary | ICD-10-CM | POA: Diagnosis not present

## 2022-08-06 DIAGNOSIS — Z452 Encounter for adjustment and management of vascular access device: Secondary | ICD-10-CM | POA: Diagnosis not present

## 2022-08-06 DIAGNOSIS — K6389 Other specified diseases of intestine: Secondary | ICD-10-CM | POA: Diagnosis not present

## 2022-08-06 DIAGNOSIS — R578 Other shock: Secondary | ICD-10-CM | POA: Diagnosis not present

## 2022-08-06 DIAGNOSIS — J9601 Acute respiratory failure with hypoxia: Secondary | ICD-10-CM | POA: Diagnosis not present

## 2022-08-06 DIAGNOSIS — Z4682 Encounter for fitting and adjustment of non-vascular catheter: Secondary | ICD-10-CM | POA: Diagnosis not present

## 2022-08-07 DIAGNOSIS — K219 Gastro-esophageal reflux disease without esophagitis: Secondary | ICD-10-CM | POA: Diagnosis not present

## 2022-08-07 DIAGNOSIS — J9601 Acute respiratory failure with hypoxia: Secondary | ICD-10-CM | POA: Diagnosis not present

## 2022-08-07 DIAGNOSIS — D72829 Elevated white blood cell count, unspecified: Secondary | ICD-10-CM | POA: Diagnosis not present

## 2022-08-07 DIAGNOSIS — F05 Delirium due to known physiological condition: Secondary | ICD-10-CM | POA: Diagnosis not present

## 2022-08-07 DIAGNOSIS — I959 Hypotension, unspecified: Secondary | ICD-10-CM | POA: Diagnosis not present

## 2022-08-07 DIAGNOSIS — J09X2 Influenza due to identified novel influenza A virus with other respiratory manifestations: Secondary | ICD-10-CM | POA: Diagnosis not present

## 2022-08-07 DIAGNOSIS — J101 Influenza due to other identified influenza virus with other respiratory manifestations: Secondary | ICD-10-CM | POA: Diagnosis not present

## 2022-08-07 DIAGNOSIS — K7681 Hepatopulmonary syndrome: Secondary | ICD-10-CM | POA: Diagnosis not present

## 2022-08-07 DIAGNOSIS — I471 Supraventricular tachycardia, unspecified: Secondary | ICD-10-CM | POA: Diagnosis not present

## 2022-08-07 DIAGNOSIS — I2489 Other forms of acute ischemic heart disease: Secondary | ICD-10-CM | POA: Diagnosis not present

## 2022-08-07 DIAGNOSIS — N179 Acute kidney failure, unspecified: Secondary | ICD-10-CM | POA: Diagnosis not present

## 2022-08-07 DIAGNOSIS — K7682 Hepatic encephalopathy: Secondary | ICD-10-CM | POA: Diagnosis not present

## 2022-08-08 DIAGNOSIS — J101 Influenza due to other identified influenza virus with other respiratory manifestations: Secondary | ICD-10-CM | POA: Diagnosis not present

## 2022-08-08 DIAGNOSIS — J9601 Acute respiratory failure with hypoxia: Secondary | ICD-10-CM | POA: Diagnosis not present

## 2022-08-08 DIAGNOSIS — I959 Hypotension, unspecified: Secondary | ICD-10-CM | POA: Diagnosis not present

## 2022-08-08 DIAGNOSIS — K7682 Hepatic encephalopathy: Secondary | ICD-10-CM | POA: Diagnosis not present

## 2022-08-08 DIAGNOSIS — I2489 Other forms of acute ischemic heart disease: Secondary | ICD-10-CM | POA: Diagnosis not present

## 2022-08-08 DIAGNOSIS — N179 Acute kidney failure, unspecified: Secondary | ICD-10-CM | POA: Diagnosis not present

## 2022-08-08 DIAGNOSIS — J810 Acute pulmonary edema: Secondary | ICD-10-CM | POA: Diagnosis not present

## 2022-08-08 DIAGNOSIS — J09X2 Influenza due to identified novel influenza A virus with other respiratory manifestations: Secondary | ICD-10-CM | POA: Diagnosis not present

## 2022-08-08 DIAGNOSIS — F05 Delirium due to known physiological condition: Secondary | ICD-10-CM | POA: Diagnosis not present

## 2022-08-08 DIAGNOSIS — I471 Supraventricular tachycardia, unspecified: Secondary | ICD-10-CM | POA: Diagnosis not present

## 2022-08-08 DIAGNOSIS — K7681 Hepatopulmonary syndrome: Secondary | ICD-10-CM | POA: Diagnosis not present

## 2022-08-08 DIAGNOSIS — D72829 Elevated white blood cell count, unspecified: Secondary | ICD-10-CM | POA: Diagnosis not present

## 2022-08-09 DIAGNOSIS — I2489 Other forms of acute ischemic heart disease: Secondary | ICD-10-CM | POA: Diagnosis not present

## 2022-08-09 DIAGNOSIS — D72829 Elevated white blood cell count, unspecified: Secondary | ICD-10-CM | POA: Diagnosis not present

## 2022-08-09 DIAGNOSIS — I959 Hypotension, unspecified: Secondary | ICD-10-CM | POA: Diagnosis not present

## 2022-08-09 DIAGNOSIS — I471 Supraventricular tachycardia, unspecified: Secondary | ICD-10-CM | POA: Diagnosis not present

## 2022-08-09 DIAGNOSIS — J9601 Acute respiratory failure with hypoxia: Secondary | ICD-10-CM | POA: Diagnosis not present

## 2022-08-09 DIAGNOSIS — J810 Acute pulmonary edema: Secondary | ICD-10-CM | POA: Diagnosis not present

## 2022-08-09 DIAGNOSIS — J09X2 Influenza due to identified novel influenza A virus with other respiratory manifestations: Secondary | ICD-10-CM | POA: Diagnosis not present

## 2022-08-09 DIAGNOSIS — N179 Acute kidney failure, unspecified: Secondary | ICD-10-CM | POA: Diagnosis not present

## 2022-08-09 DIAGNOSIS — K560 Paralytic ileus: Secondary | ICD-10-CM | POA: Diagnosis not present

## 2022-08-09 DIAGNOSIS — K7682 Hepatic encephalopathy: Secondary | ICD-10-CM | POA: Diagnosis not present

## 2022-08-09 DIAGNOSIS — K7681 Hepatopulmonary syndrome: Secondary | ICD-10-CM | POA: Diagnosis not present

## 2022-08-09 DIAGNOSIS — F05 Delirium due to known physiological condition: Secondary | ICD-10-CM | POA: Diagnosis not present

## 2022-08-10 DIAGNOSIS — K767 Hepatorenal syndrome: Secondary | ICD-10-CM | POA: Diagnosis not present

## 2022-08-10 DIAGNOSIS — K219 Gastro-esophageal reflux disease without esophagitis: Secondary | ICD-10-CM | POA: Diagnosis not present

## 2022-08-10 DIAGNOSIS — E875 Hyperkalemia: Secondary | ICD-10-CM | POA: Diagnosis not present

## 2022-08-10 DIAGNOSIS — E870 Hyperosmolality and hypernatremia: Secondary | ICD-10-CM | POA: Diagnosis not present

## 2022-08-10 DIAGNOSIS — I2489 Other forms of acute ischemic heart disease: Secondary | ICD-10-CM | POA: Diagnosis not present

## 2022-08-10 DIAGNOSIS — N179 Acute kidney failure, unspecified: Secondary | ICD-10-CM | POA: Diagnosis not present

## 2022-08-10 DIAGNOSIS — I471 Supraventricular tachycardia, unspecified: Secondary | ICD-10-CM | POA: Diagnosis not present

## 2022-08-10 DIAGNOSIS — I959 Hypotension, unspecified: Secondary | ICD-10-CM | POA: Diagnosis not present

## 2022-08-10 DIAGNOSIS — J09X2 Influenza due to identified novel influenza A virus with other respiratory manifestations: Secondary | ICD-10-CM | POA: Diagnosis not present

## 2022-08-10 DIAGNOSIS — D72829 Elevated white blood cell count, unspecified: Secondary | ICD-10-CM | POA: Diagnosis not present

## 2022-08-10 DIAGNOSIS — R509 Fever, unspecified: Secondary | ICD-10-CM | POA: Diagnosis not present

## 2022-08-10 DIAGNOSIS — J101 Influenza due to other identified influenza virus with other respiratory manifestations: Secondary | ICD-10-CM | POA: Diagnosis not present

## 2022-08-11 DIAGNOSIS — D72829 Elevated white blood cell count, unspecified: Secondary | ICD-10-CM | POA: Diagnosis not present

## 2022-08-11 DIAGNOSIS — N179 Acute kidney failure, unspecified: Secondary | ICD-10-CM | POA: Diagnosis not present

## 2022-08-11 DIAGNOSIS — I471 Supraventricular tachycardia, unspecified: Secondary | ICD-10-CM | POA: Diagnosis not present

## 2022-08-11 DIAGNOSIS — F05 Delirium due to known physiological condition: Secondary | ICD-10-CM | POA: Diagnosis not present

## 2022-08-11 DIAGNOSIS — J810 Acute pulmonary edema: Secondary | ICD-10-CM | POA: Diagnosis not present

## 2022-08-11 DIAGNOSIS — K7681 Hepatopulmonary syndrome: Secondary | ICD-10-CM | POA: Diagnosis not present

## 2022-08-11 DIAGNOSIS — J9601 Acute respiratory failure with hypoxia: Secondary | ICD-10-CM | POA: Diagnosis not present

## 2022-08-11 DIAGNOSIS — J101 Influenza due to other identified influenza virus with other respiratory manifestations: Secondary | ICD-10-CM | POA: Diagnosis not present

## 2022-08-11 DIAGNOSIS — J09X2 Influenza due to identified novel influenza A virus with other respiratory manifestations: Secondary | ICD-10-CM | POA: Diagnosis not present

## 2022-08-11 DIAGNOSIS — E875 Hyperkalemia: Secondary | ICD-10-CM | POA: Diagnosis not present

## 2022-08-11 DIAGNOSIS — I959 Hypotension, unspecified: Secondary | ICD-10-CM | POA: Diagnosis not present

## 2022-08-11 DIAGNOSIS — I2489 Other forms of acute ischemic heart disease: Secondary | ICD-10-CM | POA: Diagnosis not present

## 2022-08-11 DIAGNOSIS — E870 Hyperosmolality and hypernatremia: Secondary | ICD-10-CM | POA: Diagnosis not present

## 2022-08-12 DIAGNOSIS — D72829 Elevated white blood cell count, unspecified: Secondary | ICD-10-CM | POA: Diagnosis not present

## 2022-08-12 DIAGNOSIS — I959 Hypotension, unspecified: Secondary | ICD-10-CM | POA: Diagnosis not present

## 2022-08-12 DIAGNOSIS — E870 Hyperosmolality and hypernatremia: Secondary | ICD-10-CM | POA: Diagnosis not present

## 2022-08-12 DIAGNOSIS — E875 Hyperkalemia: Secondary | ICD-10-CM | POA: Diagnosis not present

## 2022-08-12 DIAGNOSIS — I2489 Other forms of acute ischemic heart disease: Secondary | ICD-10-CM | POA: Diagnosis not present

## 2022-08-12 DIAGNOSIS — I471 Supraventricular tachycardia, unspecified: Secondary | ICD-10-CM | POA: Diagnosis not present

## 2022-08-12 DIAGNOSIS — N179 Acute kidney failure, unspecified: Secondary | ICD-10-CM | POA: Diagnosis not present

## 2022-08-12 DIAGNOSIS — J101 Influenza due to other identified influenza virus with other respiratory manifestations: Secondary | ICD-10-CM | POA: Diagnosis not present

## 2022-08-12 DIAGNOSIS — J09X2 Influenza due to identified novel influenza A virus with other respiratory manifestations: Secondary | ICD-10-CM | POA: Diagnosis not present

## 2022-08-12 DIAGNOSIS — K7682 Hepatic encephalopathy: Secondary | ICD-10-CM | POA: Diagnosis not present

## 2022-08-12 DIAGNOSIS — J9601 Acute respiratory failure with hypoxia: Secondary | ICD-10-CM | POA: Diagnosis not present

## 2022-08-12 DIAGNOSIS — K767 Hepatorenal syndrome: Secondary | ICD-10-CM | POA: Diagnosis not present

## 2022-08-12 DIAGNOSIS — J810 Acute pulmonary edema: Secondary | ICD-10-CM | POA: Diagnosis not present

## 2022-08-13 DIAGNOSIS — I2489 Other forms of acute ischemic heart disease: Secondary | ICD-10-CM | POA: Diagnosis not present

## 2022-08-13 DIAGNOSIS — I471 Supraventricular tachycardia, unspecified: Secondary | ICD-10-CM | POA: Diagnosis not present

## 2022-08-13 DIAGNOSIS — F05 Delirium due to known physiological condition: Secondary | ICD-10-CM | POA: Diagnosis not present

## 2022-08-13 DIAGNOSIS — E870 Hyperosmolality and hypernatremia: Secondary | ICD-10-CM | POA: Diagnosis not present

## 2022-08-13 DIAGNOSIS — K7682 Hepatic encephalopathy: Secondary | ICD-10-CM | POA: Diagnosis not present

## 2022-08-13 DIAGNOSIS — J810 Acute pulmonary edema: Secondary | ICD-10-CM | POA: Diagnosis not present

## 2022-08-13 DIAGNOSIS — E875 Hyperkalemia: Secondary | ICD-10-CM | POA: Diagnosis not present

## 2022-08-13 DIAGNOSIS — N179 Acute kidney failure, unspecified: Secondary | ICD-10-CM | POA: Diagnosis not present

## 2022-08-13 DIAGNOSIS — D72829 Elevated white blood cell count, unspecified: Secondary | ICD-10-CM | POA: Diagnosis not present

## 2022-08-13 DIAGNOSIS — K7681 Hepatopulmonary syndrome: Secondary | ICD-10-CM | POA: Diagnosis not present

## 2022-08-13 DIAGNOSIS — I959 Hypotension, unspecified: Secondary | ICD-10-CM | POA: Diagnosis not present

## 2022-08-13 DIAGNOSIS — J9601 Acute respiratory failure with hypoxia: Secondary | ICD-10-CM | POA: Diagnosis not present

## 2022-08-14 DIAGNOSIS — F05 Delirium due to known physiological condition: Secondary | ICD-10-CM | POA: Diagnosis not present

## 2022-08-14 DIAGNOSIS — I471 Supraventricular tachycardia, unspecified: Secondary | ICD-10-CM | POA: Diagnosis not present

## 2022-08-14 DIAGNOSIS — K7682 Hepatic encephalopathy: Secondary | ICD-10-CM | POA: Diagnosis not present

## 2022-08-14 DIAGNOSIS — E870 Hyperosmolality and hypernatremia: Secondary | ICD-10-CM | POA: Diagnosis not present

## 2022-08-14 DIAGNOSIS — K219 Gastro-esophageal reflux disease without esophagitis: Secondary | ICD-10-CM | POA: Diagnosis not present

## 2022-08-14 DIAGNOSIS — E875 Hyperkalemia: Secondary | ICD-10-CM | POA: Diagnosis not present

## 2022-08-14 DIAGNOSIS — K7681 Hepatopulmonary syndrome: Secondary | ICD-10-CM | POA: Diagnosis not present

## 2022-08-14 DIAGNOSIS — J9601 Acute respiratory failure with hypoxia: Secondary | ICD-10-CM | POA: Diagnosis not present

## 2022-08-14 DIAGNOSIS — J810 Acute pulmonary edema: Secondary | ICD-10-CM | POA: Diagnosis not present

## 2022-08-14 DIAGNOSIS — I21A1 Myocardial infarction type 2: Secondary | ICD-10-CM | POA: Diagnosis not present

## 2022-08-15 DIAGNOSIS — I21A1 Myocardial infarction type 2: Secondary | ICD-10-CM | POA: Diagnosis not present

## 2022-08-15 DIAGNOSIS — K7681 Hepatopulmonary syndrome: Secondary | ICD-10-CM | POA: Diagnosis not present

## 2022-08-15 DIAGNOSIS — R509 Fever, unspecified: Secondary | ICD-10-CM | POA: Diagnosis not present

## 2022-08-15 DIAGNOSIS — K7682 Hepatic encephalopathy: Secondary | ICD-10-CM | POA: Diagnosis not present

## 2022-08-15 DIAGNOSIS — R918 Other nonspecific abnormal finding of lung field: Secondary | ICD-10-CM | POA: Diagnosis not present

## 2022-08-15 DIAGNOSIS — F05 Delirium due to known physiological condition: Secondary | ICD-10-CM | POA: Diagnosis not present

## 2022-08-15 DIAGNOSIS — J101 Influenza due to other identified influenza virus with other respiratory manifestations: Secondary | ICD-10-CM | POA: Diagnosis not present

## 2022-08-15 DIAGNOSIS — J189 Pneumonia, unspecified organism: Secondary | ICD-10-CM | POA: Diagnosis not present

## 2022-08-15 DIAGNOSIS — I959 Hypotension, unspecified: Secondary | ICD-10-CM | POA: Diagnosis not present

## 2022-08-15 DIAGNOSIS — I471 Supraventricular tachycardia, unspecified: Secondary | ICD-10-CM | POA: Diagnosis not present

## 2022-08-15 DIAGNOSIS — J9601 Acute respiratory failure with hypoxia: Secondary | ICD-10-CM | POA: Diagnosis not present

## 2022-08-15 DIAGNOSIS — M7989 Other specified soft tissue disorders: Secondary | ICD-10-CM | POA: Diagnosis not present

## 2022-08-15 DIAGNOSIS — N179 Acute kidney failure, unspecified: Secondary | ICD-10-CM | POA: Diagnosis not present

## 2022-08-15 DIAGNOSIS — J9811 Atelectasis: Secondary | ICD-10-CM | POA: Diagnosis not present

## 2022-08-15 DIAGNOSIS — J09X2 Influenza due to identified novel influenza A virus with other respiratory manifestations: Secondary | ICD-10-CM | POA: Diagnosis not present

## 2022-08-15 DIAGNOSIS — R188 Other ascites: Secondary | ICD-10-CM | POA: Diagnosis not present

## 2022-08-15 DIAGNOSIS — J810 Acute pulmonary edema: Secondary | ICD-10-CM | POA: Diagnosis not present

## 2022-08-15 DIAGNOSIS — D72829 Elevated white blood cell count, unspecified: Secondary | ICD-10-CM | POA: Diagnosis not present

## 2022-08-16 DIAGNOSIS — N179 Acute kidney failure, unspecified: Secondary | ICD-10-CM | POA: Diagnosis not present

## 2022-08-16 DIAGNOSIS — F05 Delirium due to known physiological condition: Secondary | ICD-10-CM | POA: Diagnosis not present

## 2022-08-16 DIAGNOSIS — B370 Candidal stomatitis: Secondary | ICD-10-CM | POA: Diagnosis not present

## 2022-08-16 DIAGNOSIS — I471 Supraventricular tachycardia, unspecified: Secondary | ICD-10-CM | POA: Diagnosis not present

## 2022-08-16 DIAGNOSIS — I959 Hypotension, unspecified: Secondary | ICD-10-CM | POA: Diagnosis not present

## 2022-08-16 DIAGNOSIS — I21A1 Myocardial infarction type 2: Secondary | ICD-10-CM | POA: Diagnosis not present

## 2022-08-16 DIAGNOSIS — J9601 Acute respiratory failure with hypoxia: Secondary | ICD-10-CM | POA: Diagnosis not present

## 2022-08-16 DIAGNOSIS — K7682 Hepatic encephalopathy: Secondary | ICD-10-CM | POA: Diagnosis not present

## 2022-08-16 DIAGNOSIS — J810 Acute pulmonary edema: Secondary | ICD-10-CM | POA: Diagnosis not present

## 2022-08-16 DIAGNOSIS — K7681 Hepatopulmonary syndrome: Secondary | ICD-10-CM | POA: Diagnosis not present

## 2022-08-16 DIAGNOSIS — R5381 Other malaise: Secondary | ICD-10-CM | POA: Diagnosis not present

## 2022-08-17 DIAGNOSIS — J9601 Acute respiratory failure with hypoxia: Secondary | ICD-10-CM | POA: Diagnosis not present

## 2022-08-17 DIAGNOSIS — J101 Influenza due to other identified influenza virus with other respiratory manifestations: Secondary | ICD-10-CM | POA: Diagnosis not present

## 2022-08-17 DIAGNOSIS — J810 Acute pulmonary edema: Secondary | ICD-10-CM | POA: Diagnosis not present

## 2022-08-17 DIAGNOSIS — R0902 Hypoxemia: Secondary | ICD-10-CM | POA: Diagnosis not present

## 2022-08-17 DIAGNOSIS — K7681 Hepatopulmonary syndrome: Secondary | ICD-10-CM | POA: Diagnosis not present

## 2022-08-17 DIAGNOSIS — R918 Other nonspecific abnormal finding of lung field: Secondary | ICD-10-CM | POA: Diagnosis not present

## 2022-08-18 DIAGNOSIS — R140 Abdominal distension (gaseous): Secondary | ICD-10-CM | POA: Diagnosis not present

## 2022-08-18 DIAGNOSIS — J9601 Acute respiratory failure with hypoxia: Secondary | ICD-10-CM | POA: Diagnosis not present

## 2022-08-18 DIAGNOSIS — J810 Acute pulmonary edema: Secondary | ICD-10-CM | POA: Diagnosis not present

## 2022-08-18 DIAGNOSIS — K7681 Hepatopulmonary syndrome: Secondary | ICD-10-CM | POA: Diagnosis not present

## 2022-08-19 DIAGNOSIS — J101 Influenza due to other identified influenza virus with other respiratory manifestations: Secondary | ICD-10-CM | POA: Diagnosis not present

## 2022-08-19 DIAGNOSIS — J810 Acute pulmonary edema: Secondary | ICD-10-CM | POA: Diagnosis not present

## 2022-08-19 DIAGNOSIS — R0902 Hypoxemia: Secondary | ICD-10-CM | POA: Diagnosis not present

## 2022-08-19 DIAGNOSIS — R918 Other nonspecific abnormal finding of lung field: Secondary | ICD-10-CM | POA: Diagnosis not present

## 2022-08-19 DIAGNOSIS — R1310 Dysphagia, unspecified: Secondary | ICD-10-CM | POA: Diagnosis not present

## 2022-08-19 DIAGNOSIS — J9601 Acute respiratory failure with hypoxia: Secondary | ICD-10-CM | POA: Diagnosis not present

## 2022-08-20 DIAGNOSIS — J810 Acute pulmonary edema: Secondary | ICD-10-CM | POA: Diagnosis not present

## 2022-08-20 DIAGNOSIS — R1310 Dysphagia, unspecified: Secondary | ICD-10-CM | POA: Diagnosis not present

## 2022-08-20 DIAGNOSIS — J9601 Acute respiratory failure with hypoxia: Secondary | ICD-10-CM | POA: Diagnosis not present

## 2022-08-20 DIAGNOSIS — G9340 Encephalopathy, unspecified: Secondary | ICD-10-CM | POA: Diagnosis not present

## 2022-08-20 DIAGNOSIS — M7989 Other specified soft tissue disorders: Secondary | ICD-10-CM | POA: Diagnosis not present

## 2022-08-21 DIAGNOSIS — R2232 Localized swelling, mass and lump, left upper limb: Secondary | ICD-10-CM | POA: Diagnosis not present

## 2022-08-21 DIAGNOSIS — J9601 Acute respiratory failure with hypoxia: Secondary | ICD-10-CM | POA: Diagnosis not present

## 2022-08-21 DIAGNOSIS — R1310 Dysphagia, unspecified: Secondary | ICD-10-CM | POA: Diagnosis not present

## 2022-08-21 DIAGNOSIS — M19032 Primary osteoarthritis, left wrist: Secondary | ICD-10-CM | POA: Diagnosis not present

## 2022-08-21 DIAGNOSIS — Z452 Encounter for adjustment and management of vascular access device: Secondary | ICD-10-CM | POA: Diagnosis not present

## 2022-08-21 DIAGNOSIS — D72829 Elevated white blood cell count, unspecified: Secondary | ICD-10-CM | POA: Diagnosis not present

## 2022-08-21 DIAGNOSIS — G9340 Encephalopathy, unspecified: Secondary | ICD-10-CM | POA: Diagnosis not present

## 2022-08-21 DIAGNOSIS — M79602 Pain in left arm: Secondary | ICD-10-CM | POA: Diagnosis not present

## 2022-08-21 DIAGNOSIS — M79601 Pain in right arm: Secondary | ICD-10-CM | POA: Diagnosis not present

## 2022-08-21 DIAGNOSIS — J810 Acute pulmonary edema: Secondary | ICD-10-CM | POA: Diagnosis not present

## 2022-08-21 DIAGNOSIS — Z4682 Encounter for fitting and adjustment of non-vascular catheter: Secondary | ICD-10-CM | POA: Diagnosis not present

## 2022-08-22 DIAGNOSIS — R739 Hyperglycemia, unspecified: Secondary | ICD-10-CM | POA: Diagnosis not present

## 2022-08-22 DIAGNOSIS — I721 Aneurysm of artery of upper extremity: Secondary | ICD-10-CM | POA: Diagnosis not present

## 2022-08-22 DIAGNOSIS — J811 Chronic pulmonary edema: Secondary | ICD-10-CM | POA: Diagnosis not present

## 2022-08-22 DIAGNOSIS — R2232 Localized swelling, mass and lump, left upper limb: Secondary | ICD-10-CM | POA: Diagnosis not present

## 2022-08-22 DIAGNOSIS — G9340 Encephalopathy, unspecified: Secondary | ICD-10-CM | POA: Diagnosis not present

## 2022-08-22 DIAGNOSIS — J9601 Acute respiratory failure with hypoxia: Secondary | ICD-10-CM | POA: Diagnosis not present

## 2022-08-22 DIAGNOSIS — M7989 Other specified soft tissue disorders: Secondary | ICD-10-CM | POA: Diagnosis not present

## 2022-08-22 DIAGNOSIS — R1310 Dysphagia, unspecified: Secondary | ICD-10-CM | POA: Diagnosis not present

## 2022-08-22 IMAGING — CR DG CHEST 2V
1 series · 2 of 2 positions shown · non-contrast
Comparison: 04/13/2021

CLINICAL DATA: Worsening shortness of breath.

EXAM:
CHEST - 2 VIEW

[Series 1: dg chest 2 view · 0.14mm/px · 2 of 2 slices shown]
[im 1/2]
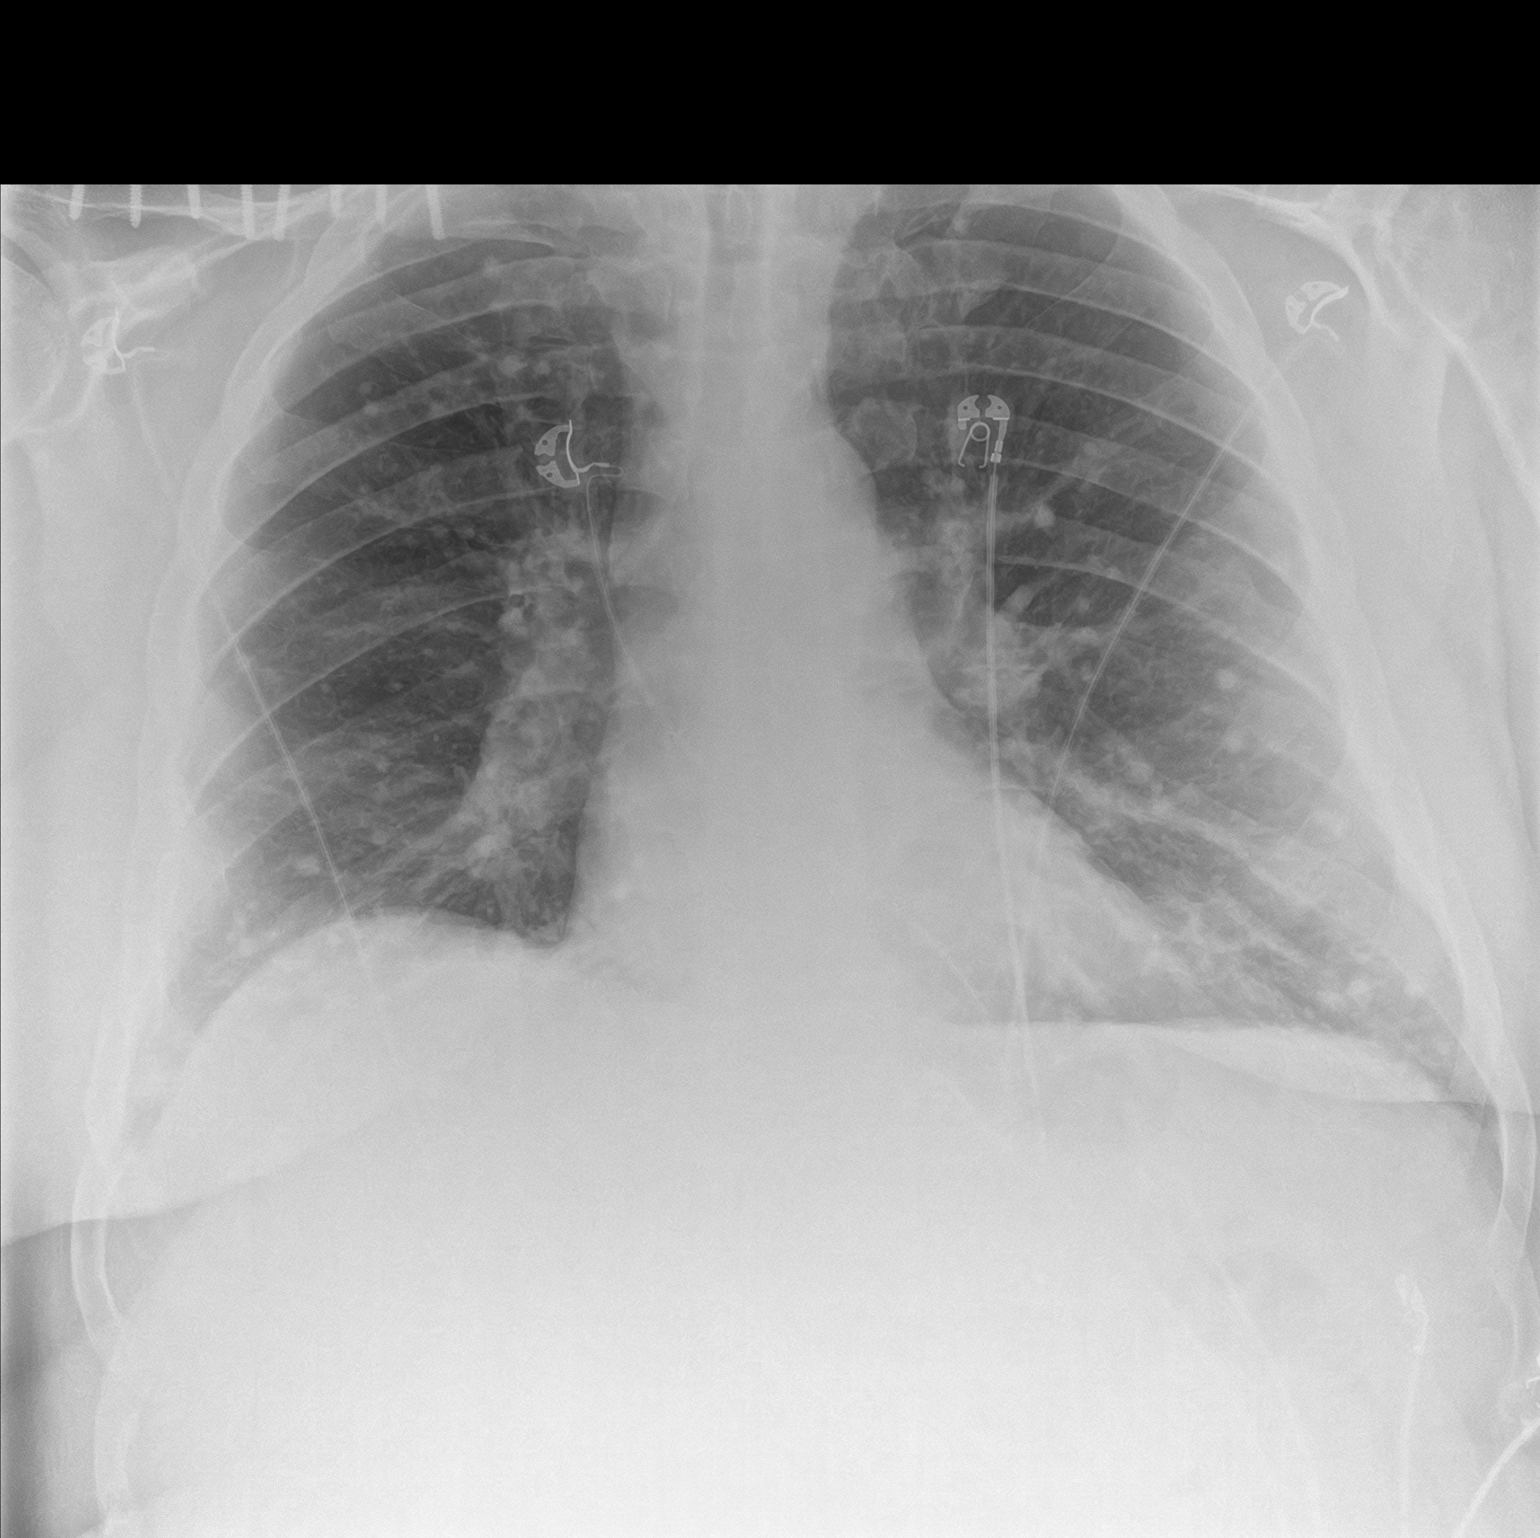
[im 2/2]
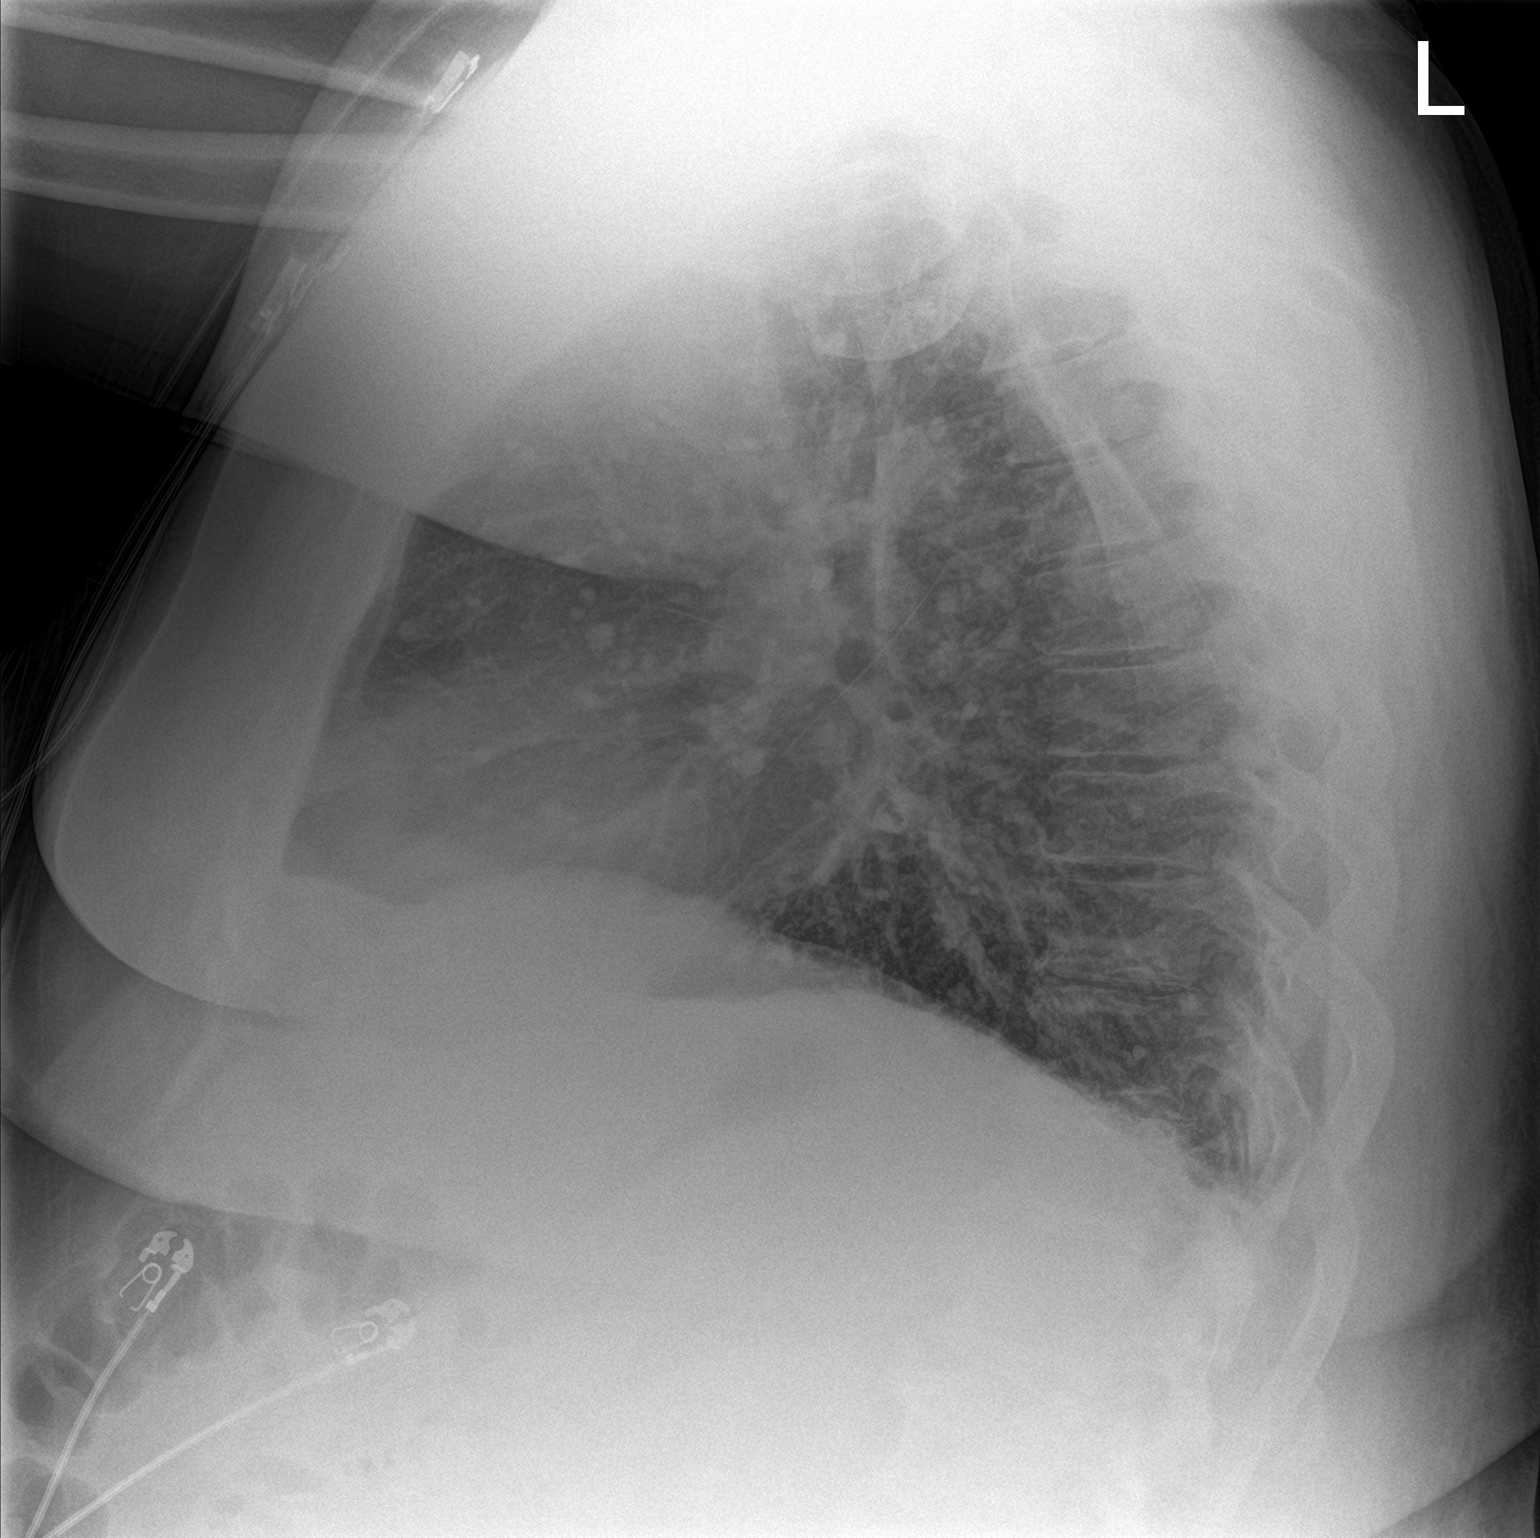

[2 of 2 positions shown; findings below may reference images not displayed]

FINDINGS: The cardiac silhouette, mediastinal and hilar contours are within
normal limits and stable.

Stable numerous bilateral calcified granulomas likely suggesting
histoplasmosis. No acute pulmonary infiltrates or pleural effusions.
The bony thorax is intact. Stable right clavicle hardware.
IMPRESSION: No acute cardiopulmonary findings. Stable calcified granulomas.

## 2022-08-23 DIAGNOSIS — J9601 Acute respiratory failure with hypoxia: Secondary | ICD-10-CM | POA: Diagnosis not present

## 2022-08-23 DIAGNOSIS — R739 Hyperglycemia, unspecified: Secondary | ICD-10-CM | POA: Diagnosis not present

## 2022-08-23 DIAGNOSIS — G9340 Encephalopathy, unspecified: Secondary | ICD-10-CM | POA: Diagnosis not present

## 2022-08-23 DIAGNOSIS — J811 Chronic pulmonary edema: Secondary | ICD-10-CM | POA: Diagnosis not present

## 2022-08-23 DIAGNOSIS — R1310 Dysphagia, unspecified: Secondary | ICD-10-CM | POA: Diagnosis not present

## 2022-08-24 DIAGNOSIS — J9601 Acute respiratory failure with hypoxia: Secondary | ICD-10-CM | POA: Diagnosis not present

## 2022-08-24 DIAGNOSIS — E43 Unspecified severe protein-calorie malnutrition: Secondary | ICD-10-CM | POA: Diagnosis not present

## 2022-08-24 DIAGNOSIS — J811 Chronic pulmonary edema: Secondary | ICD-10-CM | POA: Diagnosis not present

## 2022-08-24 DIAGNOSIS — R739 Hyperglycemia, unspecified: Secondary | ICD-10-CM | POA: Diagnosis not present

## 2022-08-24 DIAGNOSIS — R410 Disorientation, unspecified: Secondary | ICD-10-CM | POA: Diagnosis not present

## 2022-08-24 DIAGNOSIS — R1310 Dysphagia, unspecified: Secondary | ICD-10-CM | POA: Diagnosis not present

## 2022-08-25 DIAGNOSIS — J9601 Acute respiratory failure with hypoxia: Secondary | ICD-10-CM | POA: Diagnosis not present

## 2022-08-25 DIAGNOSIS — R1310 Dysphagia, unspecified: Secondary | ICD-10-CM | POA: Diagnosis not present

## 2022-08-25 DIAGNOSIS — G479 Sleep disorder, unspecified: Secondary | ICD-10-CM | POA: Diagnosis not present

## 2022-08-25 DIAGNOSIS — R410 Disorientation, unspecified: Secondary | ICD-10-CM | POA: Diagnosis not present

## 2022-08-25 DIAGNOSIS — R739 Hyperglycemia, unspecified: Secondary | ICD-10-CM | POA: Diagnosis not present

## 2022-08-25 DIAGNOSIS — G9340 Encephalopathy, unspecified: Secondary | ICD-10-CM | POA: Diagnosis not present

## 2022-08-26 DIAGNOSIS — R410 Disorientation, unspecified: Secondary | ICD-10-CM | POA: Diagnosis not present

## 2022-08-26 DIAGNOSIS — J9601 Acute respiratory failure with hypoxia: Secondary | ICD-10-CM | POA: Diagnosis not present

## 2022-08-26 DIAGNOSIS — I10 Essential (primary) hypertension: Secondary | ICD-10-CM | POA: Diagnosis not present

## 2022-08-26 DIAGNOSIS — R739 Hyperglycemia, unspecified: Secondary | ICD-10-CM | POA: Diagnosis not present

## 2022-08-26 DIAGNOSIS — R1310 Dysphagia, unspecified: Secondary | ICD-10-CM | POA: Diagnosis not present

## 2022-08-26 DIAGNOSIS — G9340 Encephalopathy, unspecified: Secondary | ICD-10-CM | POA: Diagnosis not present

## 2022-08-26 DIAGNOSIS — E43 Unspecified severe protein-calorie malnutrition: Secondary | ICD-10-CM | POA: Diagnosis not present

## 2022-08-27 DIAGNOSIS — J9601 Acute respiratory failure with hypoxia: Secondary | ICD-10-CM | POA: Diagnosis not present

## 2022-08-27 DIAGNOSIS — J101 Influenza due to other identified influenza virus with other respiratory manifestations: Secondary | ICD-10-CM | POA: Diagnosis not present

## 2022-08-28 DIAGNOSIS — J9601 Acute respiratory failure with hypoxia: Secondary | ICD-10-CM | POA: Diagnosis not present

## 2022-08-29 DIAGNOSIS — G9340 Encephalopathy, unspecified: Secondary | ICD-10-CM | POA: Diagnosis not present

## 2022-08-29 DIAGNOSIS — R410 Disorientation, unspecified: Secondary | ICD-10-CM | POA: Diagnosis not present

## 2022-08-29 DIAGNOSIS — R451 Restlessness and agitation: Secondary | ICD-10-CM | POA: Diagnosis not present

## 2022-08-29 DIAGNOSIS — R52 Pain, unspecified: Secondary | ICD-10-CM | POA: Diagnosis not present

## 2022-08-29 DIAGNOSIS — G479 Sleep disorder, unspecified: Secondary | ICD-10-CM | POA: Diagnosis not present

## 2022-08-29 DIAGNOSIS — R739 Hyperglycemia, unspecified: Secondary | ICD-10-CM | POA: Diagnosis not present

## 2022-08-29 DIAGNOSIS — R1310 Dysphagia, unspecified: Secondary | ICD-10-CM | POA: Diagnosis not present

## 2022-08-29 DIAGNOSIS — J9601 Acute respiratory failure with hypoxia: Secondary | ICD-10-CM | POA: Diagnosis not present

## 2022-08-30 DIAGNOSIS — R109 Unspecified abdominal pain: Secondary | ICD-10-CM | POA: Diagnosis not present

## 2022-08-30 DIAGNOSIS — J101 Influenza due to other identified influenza virus with other respiratory manifestations: Secondary | ICD-10-CM | POA: Diagnosis not present

## 2022-08-30 DIAGNOSIS — R188 Other ascites: Secondary | ICD-10-CM | POA: Diagnosis not present

## 2022-08-30 DIAGNOSIS — J9601 Acute respiratory failure with hypoxia: Secondary | ICD-10-CM | POA: Diagnosis not present

## 2022-08-31 DIAGNOSIS — J9601 Acute respiratory failure with hypoxia: Secondary | ICD-10-CM | POA: Diagnosis not present

## 2022-08-31 DIAGNOSIS — R451 Restlessness and agitation: Secondary | ICD-10-CM | POA: Diagnosis not present

## 2022-08-31 DIAGNOSIS — G479 Sleep disorder, unspecified: Secondary | ICD-10-CM | POA: Diagnosis not present

## 2022-08-31 DIAGNOSIS — R410 Disorientation, unspecified: Secondary | ICD-10-CM | POA: Diagnosis not present

## 2022-09-06 DIAGNOSIS — R410 Disorientation, unspecified: Secondary | ICD-10-CM | POA: Diagnosis not present

## 2022-09-06 DIAGNOSIS — G479 Sleep disorder, unspecified: Secondary | ICD-10-CM | POA: Diagnosis not present

## 2022-09-06 DIAGNOSIS — R451 Restlessness and agitation: Secondary | ICD-10-CM | POA: Diagnosis not present

## 2022-09-08 DIAGNOSIS — R188 Other ascites: Secondary | ICD-10-CM | POA: Diagnosis not present

## 2022-09-08 DIAGNOSIS — R109 Unspecified abdominal pain: Secondary | ICD-10-CM | POA: Diagnosis not present

## 2022-09-12 DIAGNOSIS — N179 Acute kidney failure, unspecified: Secondary | ICD-10-CM | POA: Diagnosis not present

## 2022-09-12 DIAGNOSIS — F1090 Alcohol use, unspecified, uncomplicated: Secondary | ICD-10-CM | POA: Diagnosis not present

## 2022-09-12 DIAGNOSIS — K7290 Hepatic failure, unspecified without coma: Secondary | ICD-10-CM | POA: Diagnosis not present

## 2022-09-13 DIAGNOSIS — I471 Supraventricular tachycardia, unspecified: Secondary | ICD-10-CM | POA: Diagnosis not present

## 2022-09-13 DIAGNOSIS — K7460 Unspecified cirrhosis of liver: Secondary | ICD-10-CM | POA: Diagnosis not present

## 2022-09-13 DIAGNOSIS — R188 Other ascites: Secondary | ICD-10-CM | POA: Diagnosis not present

## 2022-09-13 DIAGNOSIS — F1090 Alcohol use, unspecified, uncomplicated: Secondary | ICD-10-CM | POA: Diagnosis not present

## 2022-09-13 DIAGNOSIS — N179 Acute kidney failure, unspecified: Secondary | ICD-10-CM | POA: Diagnosis not present

## 2022-09-13 DIAGNOSIS — D539 Nutritional anemia, unspecified: Secondary | ICD-10-CM | POA: Diagnosis not present

## 2022-09-20 ENCOUNTER — Telehealth: Payer: Self-pay

## 2022-09-20 NOTE — Telephone Encounter (Signed)
Copied from [place] [id number]. Topic: Referral - Request for Referral >> Sep 20, 2022 12:33 PM Everette C wrote: Has patient seen PCP for this complaint? Yes.   *If NO, is insurance requiring patient see PCP for this issue before PCP can refer them? Referral for which specialty: Physical Therapy  Preferred provider/office: Patient has no preference  Reason for referral: Patient would like in-home PT for their right leg and hip

## 2022-09-20 NOTE — Telephone Encounter (Signed)
Please notify patient to schedule apt.  [facility] orders will require an office visit to document this prior to ordering.  Nobie Putnam, [facility] 09/20/2022, 5:41 PM

## 2022-09-21 ENCOUNTER — Encounter: Payer: Self-pay | Admitting: Family Medicine

## 2022-09-21 ENCOUNTER — Ambulatory Visit: Payer: Medicaid Other | Admitting: Family Medicine

## 2022-09-21 ENCOUNTER — Other Ambulatory Visit: Payer: Self-pay | Admitting: Family Medicine

## 2022-09-21 VITALS — BP 112/64 | HR 66 | Temp 96.2°F | Wt 244.0 lb

## 2022-09-21 DIAGNOSIS — J432 Centrilobular emphysema: Secondary | ICD-10-CM

## 2022-09-21 DIAGNOSIS — M545 Low back pain, unspecified: Secondary | ICD-10-CM

## 2022-09-21 DIAGNOSIS — R600 Localized edema: Secondary | ICD-10-CM

## 2022-09-21 DIAGNOSIS — M5136 Other intervertebral disc degeneration, lumbar region: Secondary | ICD-10-CM | POA: Diagnosis not present

## 2022-09-21 DIAGNOSIS — I10 Essential (primary) hypertension: Secondary | ICD-10-CM

## 2022-09-21 DIAGNOSIS — G9341 Metabolic encephalopathy: Secondary | ICD-10-CM

## 2022-09-21 DIAGNOSIS — K7031 Alcoholic cirrhosis of liver with ascites: Secondary | ICD-10-CM | POA: Diagnosis not present

## 2022-09-21 DIAGNOSIS — G8929 Other chronic pain: Secondary | ICD-10-CM

## 2022-09-21 DIAGNOSIS — R531 Weakness: Secondary | ICD-10-CM

## 2022-09-21 MED ORDER — LIDOCAINE 5 % EX PTCH: 1.0000 | MEDICATED_PATCH | CUTANEOUS | 1 refills | Status: DC

## 2022-09-21 NOTE — Progress Notes (Signed)
Subjective:    Patient ID: Tom [name], male    DOB: Sep 24, 1965, 57 y.o.   MRN: [id number]  Tom [name] is a 57 y.o. male presenting on 09/21/2022 for No chief complaint on file.   HPI  HOSPITAL FOLLOW-UP VISIT  Hospital/Location: [facility] Date of Admission: 07/20/22 Date of Discharge: 09/14/22 Transitions of care telephone call: Not completed  Reason for Admission: Alcoholic Liver Cirrhosis Ascites, Resp failure, influenza septic shock  - Hospital H&P and Discharge Summary have been reviewed - Patient presents today 7 days after recent hospitalization. Brief summary of recent course, patient had symptoms of acute respiratory failure, septic shock following influenza and cirrhosis with liver failure ascites, hospitalized, treated with long course of ICU management, intubation, diuretics, antibiotics.  Waiting on [place] Specialist for walker  Acute hypoixc respiratory failure - resolved. Discharged off oxygen Alcoholic liver disease with ascites Severe protein-calorie malnutrition  He is due for lab test today for follow-up Creatinine. Lab repeat Creatinine, if 0.6 to 1 can restart spironolactone 100mg  and furosemide 40mg   Upcoming apt with GI - Establish with [facility], 11/07/22 at [facility], they will discuss future fluid drainage with q 2 week paracentesis  He has discontinued Tramadol, due to goal to avoid med, and not needed, and risk of harming his liver  Initially recommended SNF but could not locate a facility  Discharged with new rx on Carvedilol, Lactulose, Magnesium, MVI, Rifaximin, Thiamine,   Documented Myocardial Injury due to respiratory failure  He needs to sign advanced directives. Resolved metabolic encephalopathy delirium now cleared.  Severe Dysphagia - once extubated kept NPO, not candidate of PEG. Passed modified barium swallow 08/25/22, diet advanced   He is doing his own PT Needs HH PT OT RN  - Today reports overall has  done well after discharge. Symptoms of swelling has improved now still has RLE edema. He uses walker. His breathing has improved.   - New medications on discharge: See med rec - Changes to current meds on discharge: see AVS  I have reviewed the discharge medication list, and have reconciled the current and discharge medications today.   Current Outpatient Medications:    albuterol (PROVENTIL) (2.5 MG/3ML) 0.083% nebulizer solution, Take 3 mLs (2.5 mg total) by nebulization every 4 (four) hours as needed for wheezing or shortness of breath., Disp: 75 mL, Rfl: 12   albuterol (VENTOLIN HFA) 108 (90 Base) MCG/ACT inhaler, INHALE 2 PUFFS BY MOUTH EVERY 4 HOURS AS NEEDED FOR WHEEZING FOR SHORTNESS OF BREATH, Disp: 18 g, Rfl: 3   atorvastatin (LIPITOR) 40 MG tablet, Take 1 tablet (40 mg total) by mouth daily., Disp: 90 tablet, Rfl: 3   budesonide-formoterol (SYMBICORT) 80-4.5 MCG/ACT inhaler, Take 2 puffs first thing in am and then another 2 puffs about 12 hours later., Disp: 1 each, Rfl: 12   carvedilol (COREG) 3.125 MG tablet, Take 3.125 mg by mouth 2 (two) times daily., Disp: , Rfl:    EPINEPHrine (EPIPEN 2-PAK) 0.3 mg/0.3 mL IJ SOAJ injection, Inject 0.3 mg into the muscle as needed for anaphylaxis., Disp: 2 each, Rfl: 1   famotidine (PEPCID) 20 MG tablet, Take 1 tablet (20 mg total) by mouth daily after supper. One after supper, Disp: 90 tablet, Rfl: 3   lactulose (CHRONULAC) 10 GM/15ML solution, SMARTSIG:15 Milliliter(s) By Mouth Every Other Day, Disp: , Rfl:    lidocaine (LIDODERM) 5 %, Place 1 patch onto the skin daily. Remove & Discard patch within 12 hours or as directed by  MD, Disp: 90 patch, Rfl: 1   magnesium oxide (MAG-OX) 400 (240 Mg) MG tablet, Take 2 tablets by mouth 2 (two) times daily., Disp: , Rfl:    Multiple Vitamin (MULTIVITAMIN) capsule, Take 1 capsule by mouth daily., Disp: , Rfl:    pantoprazole (PROTONIX) 40 MG tablet, Take 1 tablet (40 mg total) by mouth daily before  breakfast., Disp: 90 tablet, Rfl: 3   alfuzosin (UROXATRAL) 10 MG 24 hr tablet, Take 1 tablet (10 mg total) by mouth daily with breakfast., Disp: 30 tablet, Rfl: 11   furosemide (LASIX) 40 MG tablet, Take 1 tablet (40 mg total) by mouth daily. (Patient not taking: Reported on 09/21/2022), Disp: 30 tablet, Rfl: 0   spironolactone (ALDACTONE) 100 MG tablet, Take 1 tablet (100 mg total) by mouth daily., Disp: , Rfl:   ------------------------------------------------------------------------- Social History   Tobacco Use   Smoking status: Former    Packs/day: 1.00    Years: 39.00    Additional pack years: 0.00    Total pack years: 39.00    Types: Cigarettes    Quit date: 12/2015    Years since quitting: 6.7   Smokeless tobacco: Never  Vaping Use   Vaping Use: Never used  Substance Use Topics   Alcohol use: No    Review of Systems Per HPI unless specifically indicated above     Objective:    BP 112/64 (BP Location: Right Arm, Patient Position: Sitting, Cuff Size: Large)   Pulse 66   Temp (!) 96.2 F (35.7 C) (Temporal)   Wt 244 lb (110.7 kg)   SpO2 99%   BMI 34.03 kg/m   Wt Readings from Last 3 Encounters:  09/21/22 244 lb (110.7 kg)  07/04/22 300 lb (136.1 kg)  05/29/22 300 lb (136.1 kg)    Physical Exam Vitals and nursing note reviewed.  Constitutional:      General: He is not in acute distress.    Appearance: He is well-developed. He is obese. He is not diaphoretic.     Comments: Well-appearing, comfortable, cooperative  HENT:     Head: Normocephalic and atraumatic.  Eyes:     General:        Right eye: No discharge.        Left eye: No discharge.     Conjunctiva/sclera: Conjunctivae normal.  Neck:     Thyroid: No thyromegaly.  Cardiovascular:     Rate and Rhythm: Normal rate and regular rhythm.     Pulses: Normal pulses.     Heart sounds: Normal heart sounds. No murmur heard. Pulmonary:     Effort: Pulmonary effort is normal. No respiratory distress.      Breath sounds: Normal breath sounds. No wheezing or rales.  Musculoskeletal:        General: Normal range of motion.     Cervical back: Normal range of motion and neck supple.     Right lower leg: Edema (3+ pitting edema) present.     Left lower leg: Edema present.     Comments: Using walker  Lymphadenopathy:     Cervical: No cervical adenopathy.  Skin:    General: Skin is warm and dry.     Findings: No erythema or rash.  Neurological:     Mental Status: He is alert and oriented to person, place, and time. Mental status is at baseline.  Psychiatric:        Behavior: Behavior normal.     Comments: Well groomed, good eye contact, normal speech and  thoughts      Results for orders placed or performed during the hospital encounter of 12/07/21  Urinalysis, Complete w Microscopic Urine, Random  Result Value Ref Range   Color, Urine AMBER (A) YELLOW   APPearance CLEAR CLEAR   Specific Gravity, Urine 1.015 1.005 - 1.030   pH 6.0 5.0 - 8.0   Glucose, UA NEGATIVE NEGATIVE mg/dL   Hgb urine dipstick NEGATIVE NEGATIVE   Bilirubin Urine SMALL (A) NEGATIVE   Ketones, ur NEGATIVE NEGATIVE mg/dL   Protein, ur NEGATIVE NEGATIVE mg/dL   Nitrite NEGATIVE NEGATIVE   Leukocytes,Ua NEGATIVE NEGATIVE   Squamous Epithelial / HPF NONE SEEN 0 - 5   WBC, UA 0-5 0 - 5 WBC/hpf   RBC / HPF 0-5 0 - 5 RBC/hpf   Bacteria, UA NONE SEEN NONE SEEN   Mucus PRESENT       Assessment & Plan:   Problem List Items Addressed This Visit     Alcoholic cirrhosis of liver with ascites - Primary   Relevant Medications   spironolactone (ALDACTONE) 100 MG tablet   Other Relevant Orders   COMPLETE METABOLIC PANEL WITH GFR   Ambulatory referral to Home Health   Chronic back pain   Relevant Medications   lidocaine (LIDODERM) 5 %   DDD (degenerative disc disease), lumbar   Relevant Medications   lidocaine (LIDODERM) 5 %   Essential hypertension   Relevant Medications   carvedilol (COREG) 3.125 MG tablet    spironolactone (ALDACTONE) 100 MG tablet   Other Relevant Orders   Ambulatory referral to Home Health   RESOLVED: Metabolic encephalopathy    Resolved. Acute episode in hospitalization due to acute illness respiratory failure, septic shock, intubation. This has resolved w/ treatment of his liver and infection, now with improved fluid balance.  Delirium has resolved. He is at baseline cognitive status.      Other Visit Diagnoses     Centrilobular emphysema       Relevant Orders   Ambulatory referral to [place]   Bilateral lower extremity edema       Relevant Orders   Ambulatory referral to [place]   Generalized weakness       Relevant Orders   Ambulatory referral to [place] stay at hospital with complex course Acute illness with septic shock, delirium, respiratory failure - have resolved. Unable to DC to SNF due to unable to locate proper bed They discharged to home with attempted Home Health but has not initiated yet. Needs new orders today.  If the delirium has cleared after hospital, now and we are oriented to person place time. He would be considered Free of Delirium and in right mind to sign papers for advanced directives.  Med rec reviewed Stop taking Tramadol, Wellbutrin, Losartan, Hydroxyzine, Tizanidine.  CMET lab today to check kidney function after discharge If the Creatinine is improved 0.6 to 1.0 - we can restart diuretics - restart Spironolactone 100mg  daily and Furosemide (lasix) 40mg  daily for fluid. Has rx from hospital, we can re order if need  HYPERTENSION management, now on less medication.  Upcoming [facility] Hepatology 11/07/22 scheduled, they can discuss further med management and fluid regimen. They may schedule paracentesis in future.  We will submit referral to home health company. If you can go ahead and contact the patient advocate at [facility] hopefully they can help speed up the home health process.  Back Pain, will re order  Lidoderm patches.  Orders Placed This Encounter  Procedures   COMPLETE METABOLIC PANEL WITH GFR   Ambulatory referral to [place]    Referral Priority:   Routine    Referral Type:   Home Health Care    Referral Reason:   Specialty Services Required    Requested Specialty:   [place]    Number of Visits Requested:   1      Meds ordered this encounter  Medications   lidocaine (LIDODERM) 5 %    Sig: Place 1 patch onto the skin daily. Remove & Discard patch within 12 hours or as directed by MD    Dispense:  90 patch    Refill:  1    Follow up plan: Return if symptoms worsen or fail to improve.   Nobie Putnam, [facility] 09/21/2022, 10:54 AM

## 2022-09-21 NOTE — Assessment & Plan Note (Signed)
Resolved. Acute episode in hospitalization due to acute illness respiratory failure, septic shock, intubation. This has resolved w/ treatment of his liver and infection, now with improved fluid balance.  Delirium has resolved. He is at baseline cognitive status.

## 2022-09-21 NOTE — Telephone Encounter (Signed)
Requested medication (s) are due for refill today: yes  Requested medication (s) are on the active medication list: yes    Last refill: 07/04/22  #30  0 refills  Future visit scheduled no  Notes to clinic:Failed due to labs, please review. Thank you.  Requested Prescriptions  Pending Prescriptions Disp Refills   furosemide (LASIX) 40 MG tablet [Pharmacy Med Name: FUROSEMIDE 40 MG TABLET] 30 tablet 0    Sig: TAKE 1 TABLET BY MOUTH EVERY DAY     Cardiovascular:  Diuretics - Loop Failed - 09/21/2022 12:37 PM      Failed - K in normal range and within 180 days    Potassium  Date Value Ref Range Status  12/02/2021 3.8 3.5 - 5.1 mmol/L Final         Failed - Ca in normal range and within 180 days    Calcium  Date Value Ref Range Status  12/02/2021 8.6 (L) 8.9 - 10.3 mg/dL Final         Failed - Na in normal range and within 180 days    Sodium  Date Value Ref Range Status  12/02/2021 133 (L) 135 - 145 mmol/L Final         Failed - Cr in normal range and within 180 days    Creat  Date Value Ref Range Status  01/20/2021 1.16 0.70 - 1.30 mg/dL Final   Creatinine, Ser  Date Value Ref Range Status  12/02/2021 1.10 0.61 - 1.24 mg/dL Final         Failed - Cl in normal range and within 180 days    Chloride  Date Value Ref Range Status  12/02/2021 95 (L) 98 - 111 mmol/L Final         Failed - Mg Level in normal range and within 180 days    No results found for: "MG"       Passed - Last BP in normal range    BP Readings from Last 1 Encounters:  09/21/22 112/64         Passed - Valid encounter within last 6 months    Recent Outpatient Visits           Today Alcoholic cirrhosis of liver with ascites   [place], DO   2 months ago Alcoholic cirrhosis of liver with ascites [facility])   [place], [name] J, DO   6 months ago [place], DO   1 year ago Benign prostatic hyperplasia with urinary hesitancy   [facility] Olin Hauser, DO   1 year ago Morbid obesity [facility])   [place], [name] J, [place]

## 2022-09-21 NOTE — Telephone Encounter (Signed)
Called and spoke with patient and he has appointment today, 09/21/22 for an hospital follow up.

## 2022-09-21 NOTE — Patient Instructions (Addendum)
Thank you for coming to the office today.  If the delirium has cleared after hospital, now and we are oriented to person place time. He would be considered Free of Delirium and in right mind to sign papers.  Stop taking Tramadol, Wellbutrin, Losartan, Hydroxyzine, Tizanidine.  If the Creatinine is improved, which is kidney function measure  You can restart Spironolactone 100mg  daily and Furosemide (lasix) 40mg  daily for fluid  Please follow up with Hepatologist in May to discuss fluid medications further  We will submit referral to home health company.  If you can go ahead and contact the patient advocate at [facility] hopefully they can help speed up the home health process.   Please schedule a Follow-up Appointment to: Return if symptoms worsen or fail to improve.  If you have any other questions or concerns, please feel free to call the office or send a message through [place]. You may also schedule an earlier appointment if necessary.  Additionally, you may be receiving a survey about your experience at our office within a few days to 1 week by e-mail or mail. We value your feedback.  Nobie Putnam, DO [place]

## 2022-09-22 LAB — COMPLETE METABOLIC PANEL WITH GFR
AG Ratio: 1 (calc) (ref 1.0–2.5)
ALT: 17 U/L (ref 9–46)
AST: 41 U/L — ABNORMAL HIGH (ref 10–35)
Albumin: 3.3 g/dL — ABNORMAL LOW (ref 3.6–5.1)
Alkaline phosphatase (APISO): 94 U/L (ref 35–144)
BUN: 8 mg/dL (ref 7–25)
CO2: 25 mmol/L (ref 20–32)
Calcium: 9.4 mg/dL (ref 8.6–10.3)
Chloride: 104 mmol/L (ref 98–110)
Creat: 0.81 mg/dL (ref 0.70–1.30)
Globulin: 3.4 g/dL (calc) (ref 1.9–3.7)
Glucose, Bld: 108 mg/dL — ABNORMAL HIGH (ref 65–99)
Potassium: 4.4 mmol/L (ref 3.5–5.3)
Sodium: 137 mmol/L (ref 135–146)
Total Bilirubin: 2.4 mg/dL — ABNORMAL HIGH (ref 0.2–1.2)
Total Protein: 6.7 g/dL (ref 6.1–8.1)
eGFR: 102 mL/min/{1.73_m2} (ref >=60)

## 2022-09-23 DIAGNOSIS — R42 Dizziness and giddiness: Secondary | ICD-10-CM | POA: Diagnosis not present

## 2022-09-23 DIAGNOSIS — G8929 Other chronic pain: Secondary | ICD-10-CM | POA: Diagnosis not present

## 2022-09-23 DIAGNOSIS — Z79891 Long term (current) use of opiate analgesic: Secondary | ICD-10-CM | POA: Diagnosis not present

## 2022-09-23 DIAGNOSIS — Z91030 Bee allergy status: Secondary | ICD-10-CM | POA: Diagnosis not present

## 2022-09-23 DIAGNOSIS — Z888 Allergy status to other drugs, medicaments and biological substances status: Secondary | ICD-10-CM | POA: Diagnosis not present

## 2022-09-23 DIAGNOSIS — M25539 Pain in unspecified wrist: Secondary | ICD-10-CM | POA: Diagnosis not present

## 2022-09-23 DIAGNOSIS — Z87891 Personal history of nicotine dependence: Secondary | ICD-10-CM | POA: Diagnosis not present

## 2022-09-23 DIAGNOSIS — R9431 Abnormal electrocardiogram [ECG] [EKG]: Secondary | ICD-10-CM | POA: Diagnosis not present

## 2022-09-26 DIAGNOSIS — M7989 Other specified soft tissue disorders: Secondary | ICD-10-CM | POA: Diagnosis not present

## 2022-09-26 DIAGNOSIS — M25571 Pain in right ankle and joints of right foot: Secondary | ICD-10-CM | POA: Diagnosis not present

## 2022-09-26 DIAGNOSIS — K7460 Unspecified cirrhosis of liver: Secondary | ICD-10-CM | POA: Diagnosis not present

## 2022-09-26 DIAGNOSIS — R1110 Vomiting, unspecified: Secondary | ICD-10-CM | POA: Diagnosis not present

## 2022-09-26 DIAGNOSIS — R188 Other ascites: Secondary | ICD-10-CM | POA: Diagnosis not present

## 2022-09-27 ENCOUNTER — Telehealth: Payer: Self-pay | Admitting: *Deleted

## 2022-09-27 IMAGING — CR DG CHEST 2V
1 series · 2 of 2 positions shown · non-contrast
Comparison: 10/27/2021

CLINICAL DATA: Shortness of breath.  Acid reflux.

EXAM:
CHEST - 2 VIEW

[Series 1: dg chest 2 view · 0.14mm/px · 2 of 2 slices shown]
[im 1/2]
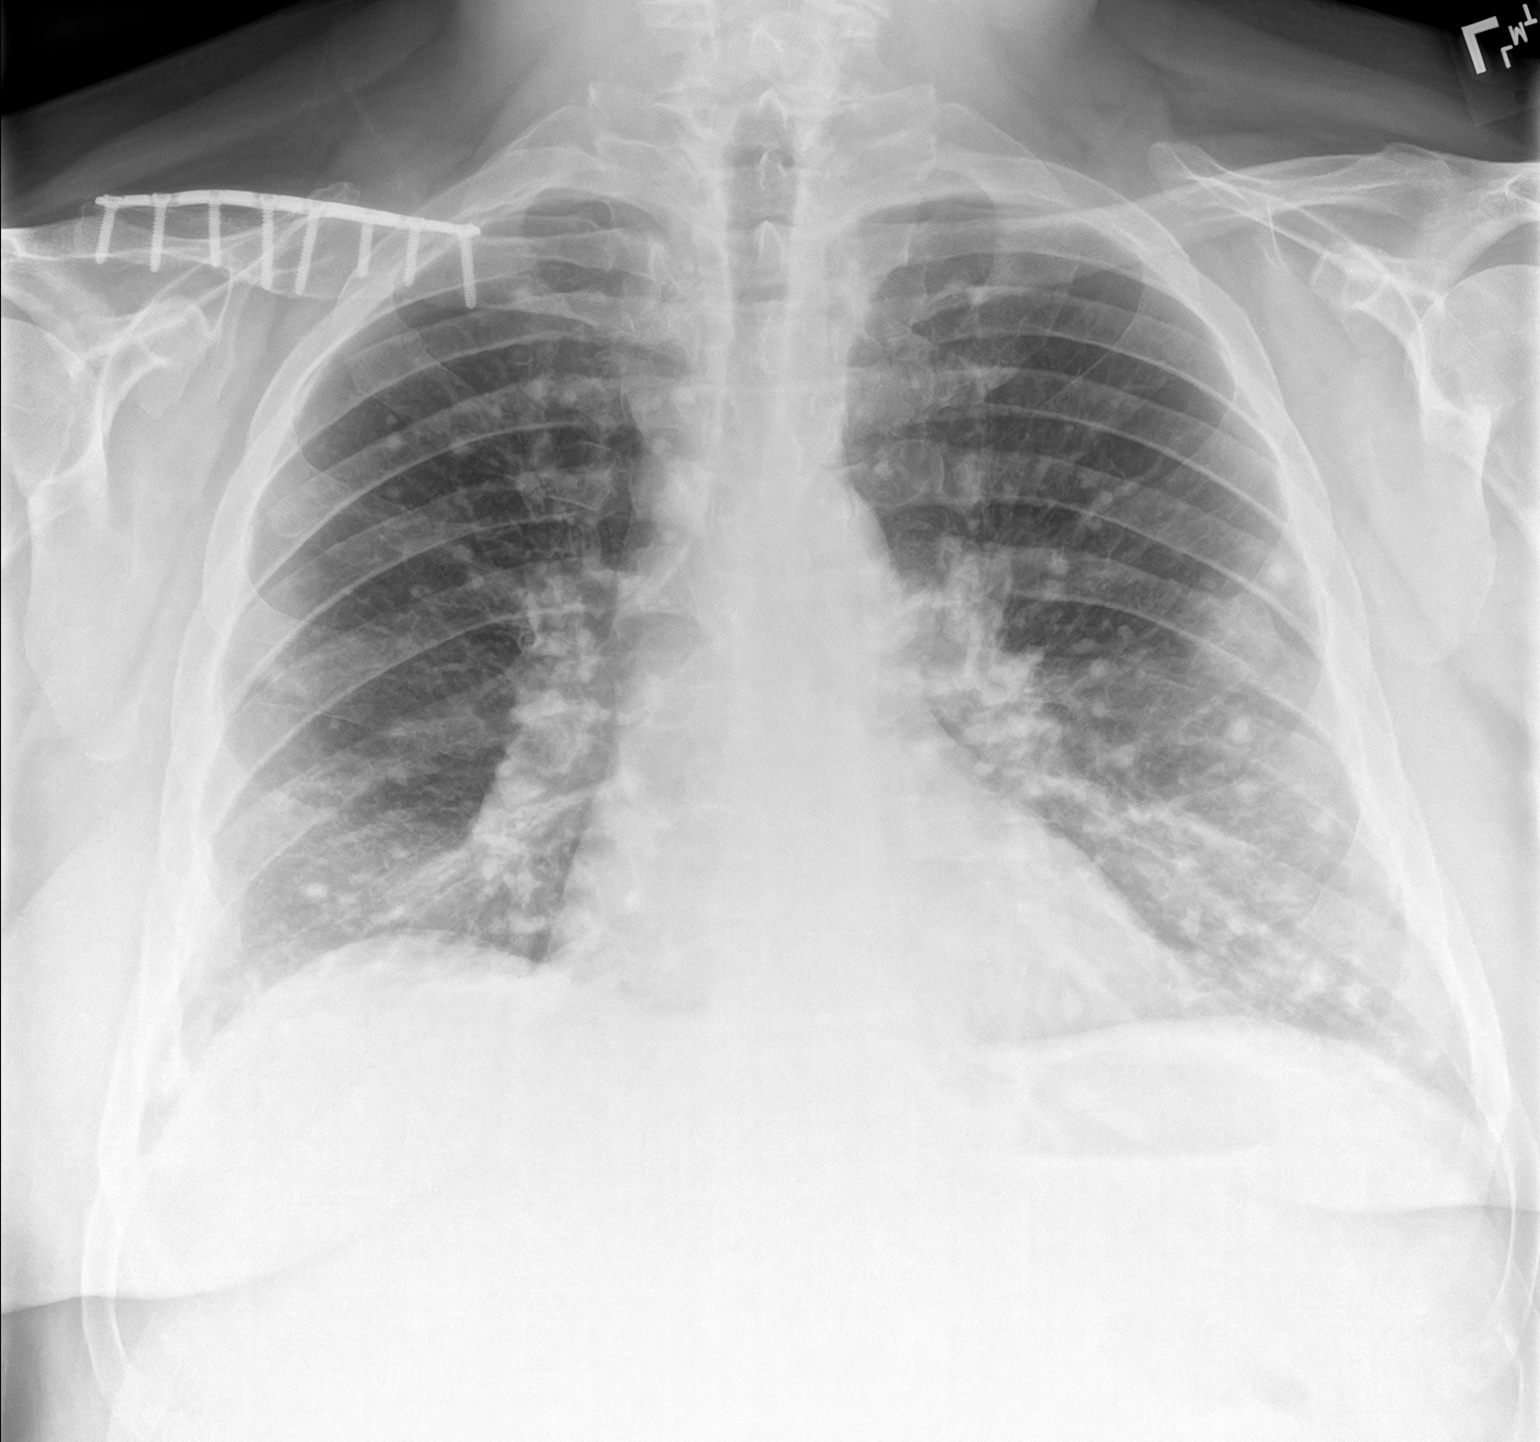
[im 2/2]
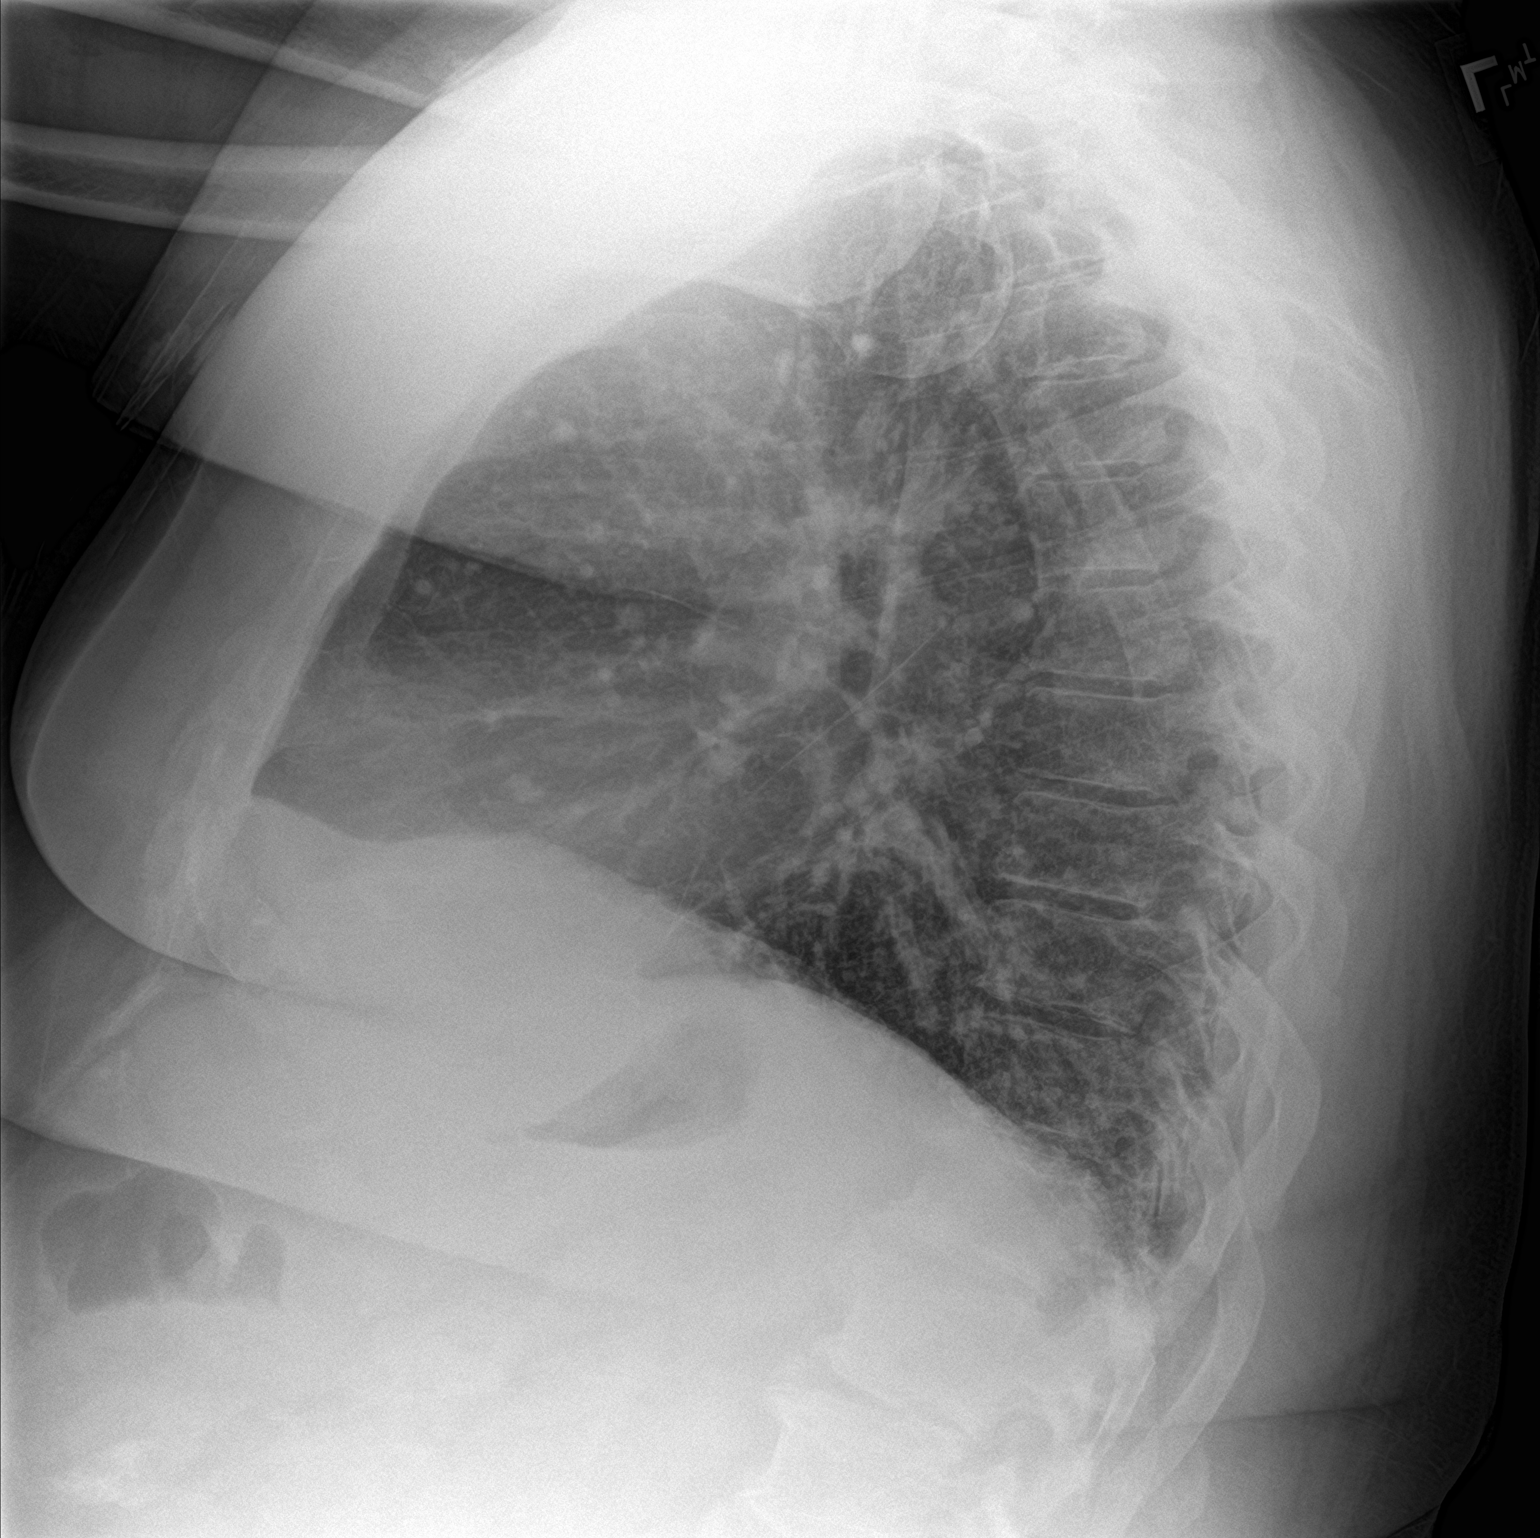

[2 of 2 positions shown; findings below may reference images not displayed]

FINDINGS: Heart size is normal. Multiple calcified granulomas scattered
throughout both lungs appear the same as were seen previously. No
evidence of consolidation, collapse or effusion. Old healed rib
fractures on the left.
IMPRESSION: No active cardiopulmonary disease. Multiple calcified granulomas as
seen previously.

## 2022-09-27 NOTE — Transitions of Care (Post Inpatient/ED Visit) (Signed)
   09/27/2022  Name: Tom [name] MRN: [id number] DOB: 10-21-1965  Today's TOC FU Call Status: Today's TOC FU Call Status:: Unsuccessul Call (1st Attempt) Unsuccessful Call (1st Attempt) Date: 09/27/22  Attempted to reach the patient regarding the most recent Inpatient/ED visit.  Follow Up Plan: Additional outreach attempts will be made to reach the patient to complete the Transitions of Care (Post Inpatient/ED visit) call.   [name] RN, BSN [place]  Managed [facility] RN Care Coordinator [phone]

## 2022-09-28 ENCOUNTER — Ambulatory Visit: Payer: Self-pay

## 2022-09-28 ENCOUNTER — Telehealth: Payer: Self-pay | Admitting: *Deleted

## 2022-09-28 NOTE — Telephone Encounter (Signed)
ED eval seems appropriate

## 2022-09-28 NOTE — Transitions of Care (Post Inpatient/ED Visit) (Signed)
   09/28/2022  Name: Tom [name] MRN: [id number] DOB: 01/12/1966  Today's TOC FU Call Status: Today's TOC FU Call Status:: Successful TOC FU Call Competed TOC FU Call Complete Date: 09/28/22  Transition Care Management Follow-up Telephone Call Date of Discharge: 09/26/22 Discharge Facility: Other ([facility] Facility) Name of Other ([facility]) Discharge Facility: [facility] ED Type of Discharge: Emergency Department Reason for ED Visit: Other: (Nausea and vomiting) How have you been since you were released from the hospital?: Better Any questions or concerns?: No  Items Reviewed: Did you receive and understand the discharge instructions provided?: Yes Medications obtained and verified?: Yes (Medications Reviewed) Any new allergies since your discharge?: No Dietary orders reviewed?: NA Do you have support at home?: Yes People in Home: alone Name of Support/Comfort Primary Source: Warden/ranger  Home Care and Equipment/Supplies: Were Home Health Services Ordered?: NA Any new equipment or medical supplies ordered?: NA  Functional Questionnaire: Do you need assistance with bathing/showering or dressing?: No Do you need assistance with meal preparation?: Yes (Patient's girlfriend) Do you need assistance with eating?: No Do you have difficulty maintaining continence: No Do you need assistance with getting out of bed/getting out of a chair/moving?: No Do you have difficulty managing or taking your medications?: Yes (Patient's girlfriend manages medications)  Follow up appointments reviewed: PCP Follow-up appointment confirmed?: NA Specialist Hospital Follow-up appointment confirmed?: Yes Date of Specialist follow-up appointment?: 11/07/22 Follow-Up Specialty Provider:: [facility] Hepatology Do you need transportation to your follow-up appointment?: No Do you understand care options if your condition(s) worsen?: Yes-patient verbalized understanding  SDOH Interventions  Today    Flowsheet Row Most Recent Value  SDOH Interventions   Transportation Interventions Intervention Not Indicated      The patient was given information about care management services as a benefit of their Medicaid health plan today.    Patient                                              agreed to services and verbal consent obtained.   [name] RN, BSN [place]  Managed [facility] RN Care Coordinator [phone]

## 2022-09-28 NOTE — Telephone Encounter (Signed)
     Chief Complaint: Pt. States he is very weak and "I can't do anything for myself. I need to go to the hospital." Lives alone.  Symptoms: Above Frequency: "A long time." Pertinent Negatives: Patient denies  Disposition: [x] ED /[] Urgent Care (no appt availability in office) / [] Appointment(In office/virtual)/ []  [place] [facility]/ [] Home Care/ [] Refused Recommended Disposition /[] [place] Mobile [facility]/ []  Follow-up with PCP Additional Notes: Will have someone take him to ED.  Reason for Disposition  Patient sounds very sick or weak to the triager  Answer Assessment - Initial Assessment Questions 1. ONSET: "When did the pain start?"      This week 2. LOCATION: "Where is the pain located?"      Both ankles 3. PAIN: "How bad is the pain?"    (Scale 1-10; or mild, moderate, severe)  - MILD (1-3): doesn't interfere with normal activities.   - MODERATE (4-7): interferes with normal activities (e.g., work or school) or awakens from sleep, limping.   - SEVERE (8-10): excruciating pain, unable to do any normal activities, unable to walk.      Severe 4. WORK OR EXERCISE: "Has there been any recent work or exercise that involved this part of the body?"      No 5. CAUSE: "What do you think is causing the ankle pain?"     Unsure 6. OTHER SYMPTOMS: "Do you have any other symptoms?" (e.g., calf pain, rash, fever, swelling)     Pain 7. PREGNANCY: "Is there any chance you are pregnant?" "When was your last menstrual period?"     N/a  Answer Assessment - Initial Assessment Questions 1. DESCRIPTION: "Describe how you are feeling."     "I can't get up and do anything for myself. I feel worse." 2. SEVERITY: "How bad is it?"  "Can you stand and walk?"   - MILD (0-3): Feels weak or tired, but does not interfere with work, school or normal activities.   - MODERATE (4-7): Able to stand and walk; weakness interferes with work, school, or normal activities.   - SEVERE (8-10): Unable to stand or  walk; unable to do usual activities.     Moderate-severe 3. ONSET: "When did these symptoms begin?" (e.g., hours, days, weeks, months)     "Ive been feeling bad a long time and I nned to go to the hospital." 4. CAUSE: "What do you think is causing the weakness or fatigue?" (e.g., not drinking enough fluids, medical problem, trouble sleeping)     Unsure 5. NEW MEDICINES:  "Have you started on any new medicines recently?" (e.g., opioid pain medicines, benzodiazepines, muscle relaxants, antidepressants, antihistamines, neuroleptics, beta blockers)     Yes 6. OTHER SYMPTOMS: "Do you have any other symptoms?" (e.g., chest pain, fever, cough, SOB, vomiting, diarrhea, bleeding, other areas of pain)     Both ankles hurt 7. PREGNANCY: "Is there any chance you are pregnant?" "When was your last menstrual period?"     N/a  Protocols used: Ankle Pain-A-AH, Weakness (Generalized) and Fatigue-A-AH

## 2022-09-30 DIAGNOSIS — K7460 Unspecified cirrhosis of liver: Secondary | ICD-10-CM | POA: Diagnosis not present

## 2022-09-30 DIAGNOSIS — Z20822 Contact with and (suspected) exposure to covid-19: Secondary | ICD-10-CM | POA: Diagnosis not present

## 2022-09-30 DIAGNOSIS — R9431 Abnormal electrocardiogram [ECG] [EKG]: Secondary | ICD-10-CM | POA: Diagnosis not present

## 2022-09-30 DIAGNOSIS — R600 Localized edema: Secondary | ICD-10-CM | POA: Diagnosis not present

## 2022-09-30 DIAGNOSIS — R109 Unspecified abdominal pain: Secondary | ICD-10-CM | POA: Diagnosis not present

## 2022-09-30 DIAGNOSIS — R1010 Upper abdominal pain, unspecified: Secondary | ICD-10-CM | POA: Diagnosis not present

## 2022-09-30 DIAGNOSIS — N179 Acute kidney failure, unspecified: Secondary | ICD-10-CM | POA: Diagnosis not present

## 2022-10-01 DIAGNOSIS — K7290 Hepatic failure, unspecified without coma: Secondary | ICD-10-CM | POA: Diagnosis not present

## 2022-10-01 DIAGNOSIS — R188 Other ascites: Secondary | ICD-10-CM | POA: Diagnosis not present

## 2022-10-01 DIAGNOSIS — R600 Localized edema: Secondary | ICD-10-CM | POA: Diagnosis not present

## 2022-10-01 DIAGNOSIS — E876 Hypokalemia: Secondary | ICD-10-CM | POA: Diagnosis not present

## 2022-10-01 DIAGNOSIS — N179 Acute kidney failure, unspecified: Secondary | ICD-10-CM | POA: Diagnosis not present

## 2022-10-01 DIAGNOSIS — R109 Unspecified abdominal pain: Secondary | ICD-10-CM | POA: Diagnosis not present

## 2022-10-01 DIAGNOSIS — K7460 Unspecified cirrhosis of liver: Secondary | ICD-10-CM | POA: Diagnosis not present

## 2022-10-02 DIAGNOSIS — Z9889 Other specified postprocedural states: Secondary | ICD-10-CM | POA: Diagnosis not present

## 2022-10-02 DIAGNOSIS — R600 Localized edema: Secondary | ICD-10-CM | POA: Diagnosis not present

## 2022-10-02 DIAGNOSIS — I071 Rheumatic tricuspid insufficiency: Secondary | ICD-10-CM | POA: Diagnosis not present

## 2022-10-02 DIAGNOSIS — N179 Acute kidney failure, unspecified: Secondary | ICD-10-CM | POA: Diagnosis not present

## 2022-10-02 DIAGNOSIS — R109 Unspecified abdominal pain: Secondary | ICD-10-CM | POA: Diagnosis not present

## 2022-10-03 DIAGNOSIS — R109 Unspecified abdominal pain: Secondary | ICD-10-CM | POA: Diagnosis not present

## 2022-10-03 DIAGNOSIS — K7290 Hepatic failure, unspecified without coma: Secondary | ICD-10-CM | POA: Diagnosis not present

## 2022-10-03 DIAGNOSIS — K920 Hematemesis: Secondary | ICD-10-CM | POA: Diagnosis not present

## 2022-10-03 DIAGNOSIS — E8770 Fluid overload, unspecified: Secondary | ICD-10-CM | POA: Diagnosis not present

## 2022-10-03 DIAGNOSIS — F1090 Alcohol use, unspecified, uncomplicated: Secondary | ICD-10-CM | POA: Diagnosis not present

## 2022-10-04 ENCOUNTER — Other Ambulatory Visit: Payer: Medicaid Other | Admitting: *Deleted

## 2022-10-04 DIAGNOSIS — E8770 Fluid overload, unspecified: Secondary | ICD-10-CM | POA: Diagnosis not present

## 2022-10-04 DIAGNOSIS — F1090 Alcohol use, unspecified, uncomplicated: Secondary | ICD-10-CM | POA: Diagnosis not present

## 2022-10-04 DIAGNOSIS — R109 Unspecified abdominal pain: Secondary | ICD-10-CM | POA: Diagnosis not present

## 2022-10-04 DIAGNOSIS — R600 Localized edema: Secondary | ICD-10-CM | POA: Diagnosis not present

## 2022-10-04 DIAGNOSIS — K7290 Hepatic failure, unspecified without coma: Secondary | ICD-10-CM | POA: Diagnosis not present

## 2022-10-04 NOTE — Patient Outreach (Signed)
  Medicaid Managed Care   Unsuccessful Attempt Note   10/04/2022 Name: Tom [name] MRN: [id number] DOB: 05/15/1966  Referred by: [name], DO Reason for referral : High Risk Managed Medicaid (Unsuccessful RNCM initial telephone outreach)   An unsuccessful telephone outreach was attempted today. The patient was referred to the case management team for assistance with care management and care coordination.    Follow Up Plan: A HIPAA compliant phone message was left for the patient providing contact information and requesting a return call. and The Managed Medicaid care management team will reach out to the patient again over the next 7 days.    [name] RN, BSN [place]  Managed [facility] RN Care Coordinator [phone]

## 2022-10-04 NOTE — Patient Instructions (Signed)
Visit Information  Mr. Aahan L Hyder  - as a part of your Medicaid benefit, you are eligible for care management and care coordination services at no cost or copay. I was unable to reach you by phone today but would be happy to help you with your health related needs. Please feel free to call me @ [phone].   A member of the Managed Medicaid care management team will reach out to you again over the next 7 days.   Tanaja Ganger RN, BSN [place]  Managed Medicaid RN Care Coordinator [phone]   

## 2022-10-04 NOTE — Patient Outreach (Signed)
Care Coordination  10/04/2022  [place] [facility] [birth year] [phone]  RNCM received phone call from Patient's sister. Patient is inpatient at a [facility] facility at this time. RNCM will follow and reach out once patient is discharged to home.  [name] RN, BSN [place]  Managed [facility] RN Care Coordinator [phone]

## 2022-10-05 ENCOUNTER — Telehealth: Payer: Self-pay | Admitting: *Deleted

## 2022-10-05 ENCOUNTER — Other Ambulatory Visit: Payer: Self-pay | Admitting: Family Medicine

## 2022-10-05 ENCOUNTER — Telehealth: Payer: Self-pay | Admitting: Family Medicine

## 2022-10-05 DIAGNOSIS — G8929 Other chronic pain: Secondary | ICD-10-CM

## 2022-10-05 DIAGNOSIS — M5136 Other intervertebral disc degeneration, lumbar region: Secondary | ICD-10-CM

## 2022-10-05 NOTE — Telephone Encounter (Signed)
Medication Refill - Medication: hydrOXYzine (ATARAX) 25 MG tablet ,    Has the patient contacted their pharmacy? Yes.     Preferred Pharmacy (with phone number or street name):  [id number] - [place], [place] - [address] Phone: [phone]  Fax: [phone]     Has the patient been seen for an appointment in the last year OR does the patient have an upcoming appointment? Yes.    The patient told his provider 2 weeks ago that he no longer wanted the tramadol but he since has changed his mind and states he does want this prescription now due to the p[ain he has been in. He recently was in the hospital due to pain and was discharged yesterday from [facility] after being transferred from [facility]. Please assist patient further

## 2022-10-05 NOTE — Transitions of Care (Post Inpatient/ED Visit) (Signed)
   10/05/2022  Name: Tom [name] MRN: [id number] DOB: [birth year]  Today's TOC FU Call Status: Today's TOC FU Call Status:: Successful TOC FU Call Competed TOC FU Call Complete Date: 10/05/22  Transition Care Management Follow-up Telephone Call Date of Discharge: 10/04/22 Discharge Facility: Other ([facility]) Name of Other ([facility]) Discharge Facility: [facility] Type of Discharge: Inpatient Admission Primary Inpatient Discharge Diagnosis:: Alcoholic Cirrhosis Reason for ED Visit: Other: (abdominal pain) How have you been since you were released from the hospital?: Same Any questions or concerns?: Yes Patient Questions/Concerns:: Patient would like to have a Nurse come to his home to help manage his care. Patient Questions/Concerns Addressed: Other: (Patient has appointment with PCP on 10/10/22)  Items Reviewed: Did you receive and understand the discharge instructions provided?: Yes Medications obtained and verified?: Yes (Medications Reviewed) Any new allergies since your discharge?: No Do you have support at home?: Yes People in Home: significant other Name of Support/Comfort Primary Source: Warden/ranger  Home Care and Equipment/Supplies: Were Home Health Services Ordered?: No Any new equipment or medical supplies ordered?: No  Functional Questionnaire: Do you need assistance with bathing/showering or dressing?: No Do you need assistance with meal preparation?: No Do you need assistance with eating?: No Do you have difficulty maintaining continence: No Do you need assistance with getting out of bed/getting out of a chair/moving?: No Do you have difficulty managing or taking your medications?: Yes  Follow up appointments reviewed: PCP Follow-up appointment confirmed?: Yes Date of PCP follow-up appointment?: 10/10/22 Follow-up Provider: PCP/Dr. Althea Charon Do you need transportation to your follow-up appointment?: No Do you understand care  options if your condition(s) worsen?: Yes-patient verbalized understanding  Medications discussed, unable to review all medications today. Patient requesting medication for pain and itching. RNCM explained that has a prescription for Hydroxyzine for itching. Patient/Sister have reached out to PCP requesting updated prescription for Tramadol. Discussed Compliance packaging, will follow up on 10/13/22 @ 2:30pm.  [name] RN, BSN [place]  Managed [facility] RN Care Coordinator [phone]

## 2022-10-05 NOTE — Telephone Encounter (Signed)
Pt  in the office requesting refill on  tramadol called [facility]   [place] he was just discharged from the hospital  while in the hospital he was taken this medication

## 2022-10-05 NOTE — Telephone Encounter (Signed)
It was discontinued due to his liver disease cirrhosis. It may not be effective. He will need to discuss further with liver doctors.  I can re order it now and we can review again next visit  Saralyn Pilar, DO [facility] 10/05/2022, 6:04 PM

## 2022-10-05 NOTE — Telephone Encounter (Signed)
The patient told his provider 2 weeks ago that he no longer wanted the tramadol but he since has changed his mind and states he does want this prescription now due to the p[ain he has been in. He recently was in the hospital due to pain and was discharged yesterday from [facility] after being transferred from [facility]. Please assist patient further

## 2022-10-06 NOTE — Telephone Encounter (Signed)
That is no problem. Thank you.  I have already Refilled Tramadol yesterday 4/17  Saralyn Pilar, DO [facility] 10/06/2022, 10:14 AM

## 2022-10-10 ENCOUNTER — Encounter: Payer: Self-pay | Admitting: Family Medicine

## 2022-10-10 ENCOUNTER — Ambulatory Visit: Payer: Medicaid Other | Admitting: Family Medicine

## 2022-10-10 VITALS — BP 129/79 | HR 96 | Ht 71.0 in | Wt 220.0 lb

## 2022-10-10 DIAGNOSIS — Z515 Encounter for palliative care: Secondary | ICD-10-CM

## 2022-10-10 DIAGNOSIS — S70351A Superficial foreign body, right thigh, initial encounter: Secondary | ICD-10-CM

## 2022-10-10 DIAGNOSIS — Z1839 Other retained organic fragments: Secondary | ICD-10-CM

## 2022-10-10 DIAGNOSIS — K7031 Alcoholic cirrhosis of liver with ascites: Secondary | ICD-10-CM

## 2022-10-10 DIAGNOSIS — M5136 Other intervertebral disc degeneration, lumbar region: Secondary | ICD-10-CM

## 2022-10-10 DIAGNOSIS — G8929 Other chronic pain: Secondary | ICD-10-CM

## 2022-10-10 DIAGNOSIS — M545 Low back pain, unspecified: Secondary | ICD-10-CM

## 2022-10-10 DIAGNOSIS — M19071 Primary osteoarthritis, right ankle and foot: Secondary | ICD-10-CM

## 2022-10-10 DIAGNOSIS — L299 Pruritus, unspecified: Secondary | ICD-10-CM

## 2022-10-10 DIAGNOSIS — R112 Nausea with vomiting, unspecified: Secondary | ICD-10-CM | POA: Diagnosis not present

## 2022-10-10 MED ORDER — OXYCODONE HCL 5 MG PO TABS
5.0000 mg | ORAL_TABLET | ORAL | 0 refills | Status: DC | PRN
Start: 2022-10-10 — End: 2022-10-25

## 2022-10-10 MED ORDER — ONDANSETRON 4 MG PO TBDP
4.0000 mg | ORAL_TABLET | Freq: Three times a day (TID) | ORAL | 2 refills | Status: DC | PRN
Start: 2022-10-10 — End: 2023-02-08

## 2022-10-10 MED ORDER — HYDROXYZINE PAMOATE 25 MG PO CAPS
25.0000 mg | ORAL_CAPSULE | Freq: Three times a day (TID) | ORAL | 2 refills | Status: DC | PRN
Start: 2022-10-10 — End: 2023-02-22

## 2022-10-10 NOTE — Patient Instructions (Addendum)
Thank you for coming to the office today.  Tick removal  Ordered Zofran ODT nausea med as needed.  Oxycodone IR  as needed.   --------------  [facility] Phone: [phone]. Her name is [place].   -------------  Referral to [facility] Palliative for symptom management  They will contact you.  -----------  We can refer to Chronic Care Management Team  They work on the phone and help coordinate all of these issues. Nurse RN Elita Quick  ------------------   Please get a list of what medical supplies you need and which company will work for you / covered  I can write and order these once I hear back.  ------------------    DME  [facility] [address] [place], [place]  [address] Ph: [phone] Fax: [phone]  [facility]. [address] [place], [place] [address] Ph: [phone] Fax: [phone]   Please schedule a Follow-up Appointment to: Return in about 2 weeks (around 10/24/2022) for 2 week follow-up Cirrhosis.  If you have any other questions or concerns, please feel free to call the office or send a message through MyChart. You may also schedule an earlier appointment if necessary.  Additionally, you may be receiving a survey about your experience at our office within a few days to 1 week by e-mail or mail. We value your feedback.  Saralyn Pilar, DO [facility], [place]

## 2022-10-10 NOTE — Progress Notes (Unsigned)
Subjective:    Patient ID: Tom [name], male    DOB: [birth year], 57 y.o.   MRN: [id number]  Tom [name] is a 57 y.o. male presenting on 10/10/2022 for Hospitalization Follow-up   HPI  HOSPITAL FOLLOW-UP VISIT  Hospital/Location: [facility] Date of Admission: 09/30/22 Date of Discharge: 10/04/22 Transitions of care telephone call: 10/06/22 Kelle Darting RN  Reason for Admission: Volume Overload / Ascites / Cirrhosis  - Hospital H&P and Discharge Summary have been reviewed - Patient presents today 6 days after recent hospitalization. Brief summary of recent course, patient had symptoms of volume overload swelling, hospitalized, drained fluid off paracentesis, treated by [facility].  Last time removed 3.5 L fluid off of him.  Nausea episodic. Still present. Lost 17 lbs since last visit.  Has upcoming [facility] 11/07/22, Hepatology visit.  He still has not received Home Health start, RN PT and home aide. Home Health Orders - were ordered by me on 09/21/22 but they failed due to no available staff in his service area. No other insurance covering the services.  He will need DME, they will arrange a list of needs and contact us with location preferred medical store  Has had some limited success recently with Tramadol, has needed to take inc amount to help back and joint and ankle pain, and was advised w/ caution at hospital, he has done better with Oxycodone IR  AS NEEDED and taking much less of it. Interested in switch  - Today reports overall has done well after discharge. Symptoms of edema fluid weight have improved or resolved with drainage He continues to take diuretic therapy  - New medications on discharge: none - Changes to current meds on discharge: none  I have reviewed the discharge medication list, and have reconciled the current and discharge medications today.   Current Outpatient Medications:    albuterol (PROVENTIL) (2.5 MG/3ML) 0.083% nebulizer  solution, Take 3 mLs (2.5 mg total) by nebulization every 4 (four) hours as needed for wheezing or shortness of breath., Disp: 75 mL, Rfl: 12   albuterol (VENTOLIN HFA) 108 (90 Base) MCG/ACT inhaler, INHALE 2 PUFFS BY MOUTH EVERY 4 HOURS AS NEEDED FOR WHEEZING FOR SHORTNESS OF BREATH, Disp: 18 g, Rfl: 3   atorvastatin (LIPITOR) 40 MG tablet, Take 1 tablet (40 mg total) by mouth daily., Disp: 90 tablet, Rfl: 3   budesonide-formoterol (SYMBICORT) 80-4.5 MCG/ACT inhaler, Take 2 puffs first thing in am and then another 2 puffs about 12 hours later., Disp: 1 each, Rfl: 12   carvedilol (COREG) 3.125 MG tablet, Take 3.125 mg by mouth 2 (two) times daily., Disp: , Rfl:    EPINEPHrine (EPIPEN 2-PAK) 0.3 mg/0.3 mL IJ SOAJ injection, Inject 0.3 mg into the muscle as needed for anaphylaxis., Disp: 2 each, Rfl: 1   famotidine (PEPCID) 20 MG tablet, Take 1 tablet (20 mg total) by mouth daily after supper. One after supper, Disp: 90 tablet, Rfl: 3   folic acid (FOLVITE) 1 MG tablet, Take 1 mg by mouth daily., Disp: , Rfl:    furosemide (LASIX) 40 MG tablet, Take 1 tablet (40 mg total) by mouth daily., Disp: 30 tablet, Rfl: 2   Homeopathic Products (LIVER SUPPORT SL), Place under the tongue., Disp: , Rfl:    hydrOXYzine (VISTARIL) 25 MG capsule, Take 1 capsule (25 mg total) by mouth every 8 (eight) hours as needed., Disp: 30 capsule, Rfl: 2   lactulose (CHRONULAC) 10 GM/15ML solution, SMARTSIG:15 Milliliter(s) By Mouth Every Other Day,  Disp: , Rfl:    magnesium oxide (MAG-OX) 400 (240 Mg) MG tablet, Take 2 tablets by mouth 2 (two) times daily., Disp: , Rfl:    Multiple Vitamin (MULTIVITAMIN) capsule, Take 1 capsule by mouth daily., Disp: , Rfl:    ondansetron (ZOFRAN-ODT) 4 MG disintegrating tablet, Take 1 tablet (4 mg total) by mouth every 8 (eight) hours as needed for nausea or vomiting., Disp: 30 tablet, Rfl: 2   oxyCODONE (OXY IR/ROXICODONE) 5 MG immediate release tablet, Take 1 tablet (5 mg total) by mouth every  4 (four) hours as needed for severe pain., Disp: 42 tablet, Rfl: 0   pantoprazole (PROTONIX) 40 MG tablet, Take 1 tablet (40 mg total) by mouth daily before breakfast., Disp: 90 tablet, Rfl: 3   spironolactone (ALDACTONE) 100 MG tablet, Take 1 tablet (100 mg total) by mouth daily., Disp: , Rfl:   ------------------------------------------------------------------------- Social History   Tobacco Use   Smoking status: Former    Packs/day: 1.00    Years: 39.00    Additional pack years: 0.00    Total pack years: 39.00    Types: Cigarettes    Quit date: 12/2015    Years since quitting: 6.8   Smokeless tobacco: Never  Vaping Use   Vaping Use: Never used  Substance Use Topics   Alcohol use: No    Review of Systems Per HPI unless specifically indicated above     Objective:    BP 129/79   Pulse 96   Ht 5\' 11"  (1.803 m)   Wt 220 lb (99.8 kg)   SpO2 99%   BMI 30.68 kg/m   Wt Readings from Last 3 Encounters:  10/10/22 220 lb (99.8 kg)  09/21/22 244 lb (110.7 kg)  07/04/22 300 lb (136.1 kg)    Physical Exam Vitals and nursing note reviewed.  Constitutional:      General: He is not in acute distress.    Appearance: He is well-developed. He is not diaphoretic.     Comments: Well-appearing, comfortable, cooperative, thinner with weight loss  HENT:     Head: Normocephalic and atraumatic.  Eyes:     General:        Right eye: No discharge.        Left eye: No discharge.     Conjunctiva/sclera: Conjunctivae normal.  Neck:     Thyroid: No thyromegaly.  Cardiovascular:     Rate and Rhythm: Normal rate and regular rhythm.     Pulses: Normal pulses.     Heart sounds: Normal heart sounds. No murmur heard. Pulmonary:     Effort: Pulmonary effort is normal. No respiratory distress.     Breath sounds: Normal breath sounds. No wheezing or rales.  Musculoskeletal:        General: Normal range of motion.     Cervical back: Normal range of motion and neck supple.     Right lower  leg: Edema (+1-2  pitting edema bilateral, improved) present.     Left lower leg: Edema present.  Lymphadenopathy:     Cervical: No cervical adenopathy.  Skin:    General: Skin is warm and dry.     Coloration: Skin is jaundiced (mild).     Findings: Lesion (R inner thigh embedded tick attached.) present. No erythema or rash.  Neurological:     Mental Status: He is alert and oriented to person, place, and time. Mental status is at baseline.  Psychiatric:        Behavior: Behavior normal.  Comments: Well groomed, good eye contact, normal speech and thoughts       Results for orders placed or performed in visit on 09/21/22  COMPLETE METABOLIC PANEL WITH GFR  Result Value Ref Range   Glucose, Bld 108 (H) 65 - 99 mg/dL   BUN 8 7 - 25 mg/dL   Creat 1.61 0.96 - 0.45 mg/dL   eGFR 409 > OR = 60 WJ/XBJ/4.78G9   BUN/Creatinine Ratio SEE NOTE: 6 - 22 (calc)   Sodium 137 135 - 146 mmol/L   Potassium 4.4 3.5 - 5.3 mmol/L   Chloride 104 98 - 110 mmol/L   CO2 25 20 - 32 mmol/L   Calcium 9.4 8.6 - 10.3 mg/dL   Total Protein 6.7 6.1 - 8.1 g/dL   Albumin 3.3 (L) 3.6 - 5.1 g/dL   Globulin 3.4 1.9 - 3.7 g/dL (calc)   AG Ratio 1.0 1.0 - 2.5 (calc)   Total Bilirubin 2.4 (H) 0.2 - 1.2 mg/dL   Alkaline phosphatase (APISO) 94 35 - 144 U/L   AST 41 (H) 10 - 35 U/L   ALT 17 9 - 46 U/L      Assessment & Plan:   Problem List Items Addressed This Visit     Alcoholic cirrhosis of liver with ascites - Primary   Relevant Orders   Amb Referral to Palliative Care   AMB Referral to Managed Medicaid Care Management   Arthritis of right ankle   Relevant Medications   oxyCODONE (OXY IR/ROXICODONE) 5 MG immediate release tablet   Other Relevant Orders   AMB Referral to Managed Medicaid Care Management   Chronic back pain   Relevant Medications   oxyCODONE (OXY IR/ROXICODONE) 5 MG immediate release tablet   Other Relevant Orders   AMB Referral to Managed Medicaid Care Management   DDD  (degenerative disc disease), lumbar   Relevant Medications   oxyCODONE (OXY IR/ROXICODONE) 5 MG immediate release tablet   Other Relevant Orders   Amb Referral to Palliative Care   AMB Referral to Managed Medicaid Care Management   Other Visit Diagnoses     Nausea and vomiting, unspecified vomiting type       Relevant Medications   ondansetron (ZOFRAN-ODT) 4 MG disintegrating tablet   Other Relevant Orders   Amb Referral to Palliative Care   AMB Referral to Managed Medicaid Care Management   Palliative care patient       Relevant Orders   AMB Referral to Managed Medicaid Care Management   Itching       Relevant Medications   hydrOXYzine (VISTARIL) 25 MG capsule   Embedded tick of right thigh, initial encounter           HFU Cirrhosis, alcoholic  Volume status stable today, to improved after large volume loss from paracentesis at hospital  Upcoming [facility] Hepatology GI May 2024  Continue Cirrhosis management w/ diuretic regimen  Symptom management today Rx Ordered Zofran ODT nausea med as needed. Discontinue Tramadol due to taking too many hepatic dosing recommend and it was ineffective START Rx Oxycodone IR 5mg  as needed  Re-attempt to refer to [facility] Phone: [phone]. Her name is [place].  However, updated per Retta Diones that they were unable to provide service due to not accepting his insurance  Message copied here = He has [employer]. We no longer accept this policy. This is the only Medicaid policy that we do not accept "  -------------  Referral to [facility] Palliative for symptom  management  We can refer to Chronic Care Management Team / Managed Medicaid progoram  For DME needs Please get a list of what medical supplies you need and which company will work for you / covered  I can write and order these once I hear back.   R Thigh inner Tick Removed today with forceps, all remnants removed. No rash Unsure duration  of attached time.  Orders Placed This Encounter  Procedures   Amb Referral to Palliative Care    Referral Priority:   Urgent    Referral Type:   Consultation    Number of Visits Requested:   1   AMB Referral to Managed Medicaid Care Management    Referral Priority:   Routine    Referral Type:   Consultation    Referral Reason:   Managed Medicaid Care Management    Number of Visits Requested:   1     Meds ordered this encounter  Medications   oxyCODONE (OXY IR/ROXICODONE) 5 MG immediate release tablet    Sig: Take 1 tablet (5 mg total) by mouth every 4 (four) hours as needed for severe pain.    Dispense:  42 tablet    Refill:  0   ondansetron (ZOFRAN-ODT) 4 MG disintegrating tablet    Sig: Take 1 tablet (4 mg total) by mouth every 8 (eight) hours as needed for nausea or vomiting.    Dispense:  30 tablet    Refill:  2   hydrOXYzine (VISTARIL) 25 MG capsule    Sig: Take 1 capsule (25 mg total) by mouth every 8 (eight) hours as needed.    Dispense:  30 capsule    Refill:  2    Follow up plan: Return in about 2 weeks (around 10/24/2022) for 2 week follow-up Cirrhosis.   Saralyn Pilar, DO [facility] 10/10/2022, 1:49 PM

## 2022-10-11 ENCOUNTER — Encounter: Payer: Self-pay | Admitting: Family Medicine

## 2022-10-12 ENCOUNTER — Other Ambulatory Visit: Payer: Medicaid Other

## 2022-10-12 DIAGNOSIS — Z515 Encounter for palliative care: Secondary | ICD-10-CM

## 2022-10-12 NOTE — Progress Notes (Signed)
(  Key: Shelly Flatten)   [facility] has not replied to your PA request. Turnaround time for review of a PA request is dependent upon insurance plan and can range from 24 hours to 5 calendar days.  You may close this dialog, return to your dashboard, and perform other tasks. To check for an update later, click on the "[employer] Detailed Status" located in the upper right corner of your dashboard.  If this search option is not available or [facility] has not replied to your request within this timeframe, please contact the number on the back of the patient's insurance card.

## 2022-10-12 NOTE — Progress Notes (Signed)
Okay to do Peer to Peer if you can arrange it for this week. Let me know when!  Saralyn Pilar, DO [facility] 10/12/2022, 1:31 PM

## 2022-10-12 NOTE — Progress Notes (Signed)
[facility] reviewed your OXYCODONE HCL (IR) 5 MG TABLET request for the aboveidentified member, and it is denied for the following reason: because we did not see certain  details about your illness and treatment. We see that this request is for a drug called  oxycodone hydrochloride for your illness (other intervertebral disc degeneration, lumbar  region). We may consider continued approval of this drug when we see certain records  (documentation as to why you need continued opioid treatment and a current plan of care). We  did not see such records (documentation may include, but is not limited to, chart notes,  prescription claims records, prescription receipts, and laboratory data). We based this decision  on your health plan's prior authorization criteria named Opioid Analgesics. If you have any questions, contact the [facility] Department at [phone].  If you would like to speak to the clinician/physician who made this decision, please call  [phone] and request a peer-to-peer review.    I did send office notes and this is the response received.  Please advise on next step.

## 2022-10-12 NOTE — Progress Notes (Signed)
TELEPHONE ENCOUNTER   Palliative care SW connected with patient to discuss new PC referral.  Patient was receptive to services and stated that he was feeling okay today. Patient states that he normally has constant pain but hat had just took some pain relievers and feels fine at this time. Patient shares that he mostly stays in the home but has a girlfriend that lives with him and provides care assistance if needed. She also works during the day. Patient voiced no concerns at this time.  TC: to patients friend, Delorise Jackson. LVM.  Plan: in person PC visit scheduled for 5/3 .

## 2022-10-12 NOTE — Progress Notes (Signed)
Called [phone] to schedule peer to peer.  Reference number [id number].  Info has been sent to review and they will call back to schedule/discuss case.

## 2022-10-13 ENCOUNTER — Other Ambulatory Visit: Payer: Medicaid Other | Admitting: *Deleted

## 2022-10-13 DIAGNOSIS — N179 Acute kidney failure, unspecified: Secondary | ICD-10-CM | POA: Diagnosis not present

## 2022-10-13 NOTE — Patient Instructions (Signed)
Visit Information  [name]  - as a part of your Medicaid benefit, you are eligible for care management and care coordination services at no cost or copay. I was unable to reach you by phone today but would be happy to help you with your health related needs. Please feel free to call me @ [phone].   A member of the Managed Medicaid care management team will reach out to you again over the next 7 days.   [name] RN, BSN [place]  Managed [facility] RN Care Coordinator [phone]

## 2022-10-13 NOTE — Patient Outreach (Signed)
  Medicaid Managed Care   Unsuccessful Attempt Note   10/13/2022 Name: Tom [name] MRN: [id number] DOB: 04-20-1966  Referred by: [name], DO Reason for referral : High Risk Managed Medicaid (Unsuccessful RNCM initial telephone outreach)   An unsuccessful telephone outreach was attempted today. The patient was referred to the case management team for assistance with care management and care coordination.    Follow Up Plan: The Managed Medicaid care management team will reach out to the patient again over the next 7 days.    [name] RN, BSN [place]  Managed [facility] RN Care Coordinator [phone]

## 2022-10-13 NOTE — Progress Notes (Signed)
Spoke to [facility] regarding patients medication authorization.  She needs documentation stating why the patient is on the continued therapy and what the plan of care is for this patient.  Can fax the note to [phone].  Letter has already been started.

## 2022-10-17 ENCOUNTER — Other Ambulatory Visit: Payer: Self-pay | Admitting: Family Medicine

## 2022-10-17 DIAGNOSIS — N179 Acute kidney failure, unspecified: Secondary | ICD-10-CM | POA: Diagnosis not present

## 2022-10-17 NOTE — Telephone Encounter (Signed)
Pt called asking for Lactulose.  He got it at the hospital and needs a refill.  [facility] [place] [facility]  CB#  [phone]

## 2022-10-18 NOTE — Telephone Encounter (Signed)
Requested medication (s) are due for refill today: For review  Requested medication (s) are on the active medication list: yes    Last refill: 09/21/22  Future visit scheduled Yes 10/20/22  Notes to clinic:Historical Provider, please review. Thank you.  Requested Prescriptions  Pending Prescriptions Disp Refills   lactulose (CHRONULAC) 10 GM/15ML solution 236 mL      Gastroenterology:  Laxatives - lactulose Passed - 10/17/2022  5:21 PM      Passed - Cl in normal range and within 360 days    Chloride  Date Value Ref Range Status  09/21/2022 104 98 - 110 mmol/L Final         Passed - CO2 in normal range and within 360 days    CO2  Date Value Ref Range Status  09/21/2022 25 20 - 32 mmol/L Final         Passed - K in normal range and within 360 days    Potassium  Date Value Ref Range Status  09/21/2022 4.4 3.5 - 5.3 mmol/L Final         Passed - Na in normal range and within 360 days    Sodium  Date Value Ref Range Status  09/21/2022 137 135 - 146 mmol/L Final         Passed - Valid encounter within last 12 months    Recent Outpatient Visits           1 week ago Alcoholic cirrhosis of liver with ascites [facility])   [place] [facility] Atkinson, [name], DO   3 weeks ago Alcoholic cirrhosis of liver with ascites [facility])   [place] [facility] Pope, [name], DO   3 months ago Alcoholic cirrhosis of liver with ascites [facility])   [place] [facility] [name], DO   7 months ago Pre-diabetes   [facility] [name], DO   1 year ago Benign prostatic hyperplasia with urinary hesitancy   [place] [facility] Althea Charon, [name], DO       Future Appointments             In 2 days Althea Charon, [name], DO [facility]   In 6 days Althea Charon, [name], DO [facility], [facility]

## 2022-10-19 ENCOUNTER — Encounter: Payer: Self-pay | Admitting: *Deleted

## 2022-10-19 ENCOUNTER — Other Ambulatory Visit: Payer: Medicaid Other | Admitting: *Deleted

## 2022-10-19 MED ORDER — LACTULOSE 10 GM/15ML PO SOLN: ORAL | 0 refills | Status: DC

## 2022-10-19 NOTE — Patient Outreach (Signed)
Care Coordination  10/19/2022  Mathayus Stanbery [facility] 12-10-1965 [id number]    Successful telephone outreach with Mr. Rideaux today. However, he was at an Eye exam appointment and unable to complete this visit. RNCM will reconnect with Mr. Arps on Friday 10/21/22 @ 3pm.  [name] RN, BSN   Managed [facility] RN Care Coordinator [phone]

## 2022-10-20 ENCOUNTER — Encounter: Payer: Self-pay | Admitting: Family Medicine

## 2022-10-20 ENCOUNTER — Ambulatory Visit (INDEPENDENT_AMBULATORY_CARE_PROVIDER_SITE_OTHER): Payer: Medicaid Other | Admitting: Family Medicine

## 2022-10-20 VITALS — BP 118/68 | HR 104 | Ht 71.0 in | Wt 227.0 lb

## 2022-10-20 DIAGNOSIS — K7031 Alcoholic cirrhosis of liver with ascites: Secondary | ICD-10-CM | POA: Diagnosis not present

## 2022-10-20 DIAGNOSIS — G8929 Other chronic pain: Secondary | ICD-10-CM

## 2022-10-20 DIAGNOSIS — M5136 Other intervertebral disc degeneration, lumbar region: Secondary | ICD-10-CM | POA: Diagnosis not present

## 2022-10-20 DIAGNOSIS — M545 Low back pain, unspecified: Secondary | ICD-10-CM

## 2022-10-20 DIAGNOSIS — Z515 Encounter for palliative care: Secondary | ICD-10-CM

## 2022-10-20 MED ORDER — LACTULOSE 10 GM/15ML PO SOLN
ORAL | 5 refills | Status: DC
Start: 2022-10-20 — End: 2023-04-06

## 2022-10-20 NOTE — Patient Instructions (Addendum)
Thank you for coming to the office today.  Re ordered Lactulose take every other day  Keep on current fluid pill dosage as adjusted by [facility] Liver specialist  Keep up with [facility] Palliative tomorrow, discuss with Pain Medication with them and how to help manage your symptoms. I can re order next week based on what they say.  Please schedule a Follow-up Appointment to: Return in about 4 weeks (around 11/17/2022) for 4 week follow-up Cirrhosis, Pain / Palliative updates.  If you have any other questions or concerns, please feel free to call the office or send a message through MyChart. You may also schedule an earlier appointment if necessary.  Additionally, you may be receiving a survey about your experience at our office within a few days to 1 week by e-mail or mail. We value your feedback.  Saralyn Pilar, DO [facility], [place]

## 2022-10-20 NOTE — Progress Notes (Signed)
Subjective:    Patient ID: Tom [name], male    DOB: 1966-05-19, 57 y.o.   MRN: [id number]  Tom [name] is a 57 y.o. male presenting on 10/20/2022 for Medical Management of Chronic Issues   HPI  Cirrhosis, Alcoholic Ascites / Edema He has seen [facility] Hepatology No alcohol 7 months, he is doing better [facility] Hepatology lowered his diuretics from Spiro 100 down to 25 and Furosemide 40 down to 20 Also he needs Lactulose ordered every other day Needs new orders.  R Foot Pain Has R foot CAM Walker boot, has bone spur Improved on pain medication  Chronic Back Pain / Lumbar DDD He has been managed on pain medication recently after cirrhosis diagnosis due to limited function with chronic back pain. He cannot take Tylenol or NSAID. Previously on Tramadol. Now he is on Oxycodone AS NEEDED He has upcoming Palliative Care Visit tomorrow to discuss symptom management. I advised him to discuss pain with them and they can review with me for further prescribing.     03/21/2022    2:56 PM 12/16/2020    3:16 PM  Depression screen PHQ 2/9  Decreased Interest 0 0  Down, Depressed, Hopeless 0 0  PHQ - 2 Score 0 0  Altered sleeping 0 0  Tired, decreased energy 0 0  Change in appetite 0 0  Feeling bad or failure about yourself  0 0  Trouble concentrating 0 0  Moving slowly or fidgety/restless 0 0  Suicidal thoughts 0 0  PHQ-9 Score 0 0  Difficult doing work/chores Not difficult at all Not difficult at all    Social History   Tobacco Use   Smoking status: Former    Packs/day: 1.00    Years: 39.00    Additional pack years: 0.00    Total pack years: 39.00    Types: Cigarettes    Quit date: 12/2015    Years since quitting: 6.8   Smokeless tobacco: Never  Vaping Use   Vaping Use: Never used  Substance Use Topics   Alcohol use: No    Review of Systems Per HPI unless specifically indicated above     Objective:    BP 118/68   Pulse (!) 104   Ht 5\' 11"  (1.803 m)    Wt 227 lb (103 kg)   SpO2 94%   BMI 31.66 kg/m   Wt Readings from Last 3 Encounters:  10/20/22 227 lb (103 kg)  10/10/22 220 lb (99.8 kg)  09/21/22 244 lb (110.7 kg)    Physical Exam Vitals and nursing note reviewed.  Constitutional:      General: He is not in acute distress.    Appearance: He is well-developed. He is not diaphoretic.     Comments: Well-appearing, comfortable, cooperative, thinner with weight loss  HENT:     Head: Normocephalic and atraumatic.  Eyes:     General:        Right eye: No discharge.        Left eye: No discharge.     Conjunctiva/sclera: Conjunctivae normal.  Neck:     Thyroid: No thyromegaly.  Cardiovascular:     Rate and Rhythm: Normal rate and regular rhythm.     Pulses: Normal pulses.     Heart sounds: Normal heart sounds. No murmur heard. Pulmonary:     Effort: Pulmonary effort is normal. No respiratory distress.     Breath sounds: Normal breath sounds. No wheezing or rales.  Musculoskeletal:  General: Normal range of motion.     Cervical back: Normal range of motion and neck supple.     Right lower leg: Edema (+1-2  pitting edema bilateral, improved) present.     Left lower leg: Edema present.  Lymphadenopathy:     Cervical: No cervical adenopathy.  Skin:    General: Skin is warm and dry.     Coloration: Skin is jaundiced (mild to improved now nearly resolved).     Findings: Lesion (R inner thigh embedded tick attached.) present. No erythema or rash.  Neurological:     Mental Status: He is alert and oriented to person, place, and time. Mental status is at baseline.  Psychiatric:        Behavior: Behavior normal.     Comments: Well groomed, good eye contact, normal speech and thoughts       Results for orders placed or performed in visit on 09/21/22  COMPLETE METABOLIC PANEL WITH GFR  Result Value Ref Range   Glucose, Bld 108 (H) 65 - 99 mg/dL   BUN 8 7 - 25 mg/dL   Creat 1.61 0.96 - 0.45 mg/dL   eGFR 409 > OR = 60  WJ/XBJ/4.78G9   BUN/Creatinine Ratio SEE NOTE: 6 - 22 (calc)   Sodium 137 135 - 146 mmol/L   Potassium 4.4 3.5 - 5.3 mmol/L   Chloride 104 98 - 110 mmol/L   CO2 25 20 - 32 mmol/L   Calcium 9.4 8.6 - 10.3 mg/dL   Total Protein 6.7 6.1 - 8.1 g/dL   Albumin 3.3 (L) 3.6 - 5.1 g/dL   Globulin 3.4 1.9 - 3.7 g/dL (calc)   AG Ratio 1.0 1.0 - 2.5 (calc)   Total Bilirubin 2.4 (H) 0.2 - 1.2 mg/dL   Alkaline phosphatase (APISO) 94 35 - 144 U/L   AST 41 (H) 10 - 35 U/L   ALT 17 9 - 46 U/L      Assessment & Plan:   Problem List Items Addressed This Visit     Alcoholic cirrhosis of liver with ascites (HCC) - Primary   Relevant Medications   furosemide (LASIX) 20 MG tablet   spironolactone (ALDACTONE) 25 MG tablet   lactulose (CHRONULAC) 10 GM/15ML solution   Chronic back pain   DDD (degenerative disc disease), lumbar   Other Visit Diagnoses     Palliative care patient           Alcoholic Cirrhosis Followed by [facility] Continue current diuretic regimen per [facility] Now lower doses, updated chart. Re order Lactulose QOD  Chronic Pain / Lumbar Spine / Back / Foot On Oxycodone AS NEEDED to help him function, caution with liver disease, limited other med options  Keep up with [facility] Palliative tomorrow, discuss with Pain Medication with them and how to help manage your symptoms. I can re order next week based on what they say.  Meds ordered this encounter  Medications   lactulose (CHRONULAC) 10 GM/15ML solution    Sig: SMARTSIG:15 Milliliter(s) By Mouth Every Other Day    Dispense:  236 mL    Refill:  5      Follow up plan: Return in about 4 weeks (around 11/17/2022) for 4 week follow-up Cirrhosis, Pain / Palliative updates.   Saralyn Pilar, DO [facility] 10/20/2022, 1:29 PM

## 2022-10-21 ENCOUNTER — Other Ambulatory Visit: Payer: Medicaid Other | Admitting: *Deleted

## 2022-10-21 ENCOUNTER — Other Ambulatory Visit: Payer: Medicaid Other

## 2022-10-21 ENCOUNTER — Encounter: Payer: Self-pay | Admitting: *Deleted

## 2022-10-21 DIAGNOSIS — T849XXA Unspecified complication of internal orthopedic prosthetic device, implant and graft, initial encounter: Secondary | ICD-10-CM | POA: Diagnosis present

## 2022-10-21 DIAGNOSIS — Z515 Encounter for palliative care: Secondary | ICD-10-CM

## 2022-10-21 DIAGNOSIS — M79671 Pain in right foot: Secondary | ICD-10-CM | POA: Diagnosis not present

## 2022-10-21 DIAGNOSIS — T8484XA Pain due to internal orthopedic prosthetic devices, implants and grafts, initial encounter: Secondary | ICD-10-CM | POA: Diagnosis not present

## 2022-10-21 NOTE — Patient Outreach (Signed)
   Embedded Care Coordination  Case Closure Note  10/21/2022 Name: Tom [name] MRN: [id number] DOB: 12-24-1965  [name] is a 57 y.o. year old male who is a primary care patient of [name], DO. The Embedded Care Coordination team was consulted for assistance with chronic disease management and care coordination needs related to  Liver Cirrhosis  Patient now with Palliative Care.  The patient has met all care management goals, agreed to case closure, and has been provided with contact information for the care management team. Appropriate care team members and provider have been notified via electronic communication. The care management team is available to provide care management/care coordination support at any time in the future should needs arise. Further engagement requires referral order ([id number]).   If patient returns call to provider office and is in need of assistance from the embedded care coordination team, please advise that the patient call the [facility] Care Guide at [phone] for assistance.   [name] RN, BSN [place]  Managed [facility] RN Care Coordinator [phone]

## 2022-10-22 NOTE — Progress Notes (Signed)
COMMUNITY PALLIATIVE CARE SW NOTE  PATIENT NAME: Tom [name] DOB: Nov 13, 1965 MRN: [id number]  PRIMARY CARE PROVIDER: [name], DO  RESPONSIBLE PARTY:  Acct ID - Guarantor Home Phone Work Phone Relationship Acct Type  [id number] Hurman Horn* [phone]  Self P/F     [address], [place], [place] [address]     PLAN OF CARE and INTERVENTIONS:              GOALS OF CARE/ ADVANCE CARE PLANNING:    Goals include to maximize quality of life and assist with pain management. Our advance care planning conversation included a discussion about:    The value and importance of advance care planning  Review and updating or creation of an advance directive document.                          Code Status: FULL CODE  ACD: Patient has HCPOA documents that need to be completed.                         GOC: ongoing discussion. Patients wishes to have full scope of treatment and is not interested in hospice discussion at this time.  2.        SOCIAL/EMOTIONAL/SPIRITUAL ASSESSMENT/ INTERVENTIONS:         Palliative care encounter: SW completed initial in home visit with patient. Patients sister present. PC criteria reviewed and discussed. Consent form reviewed and signed.   Presenting problem: patient 57 yr male, lives at home with spouse, has multiple chronic medical conditions requiring additional care including Chronic Pain / Lumbar Spine / Back / Foot and Alcoholic cirrhosis of liver with ascites. Per PCP patient would benefit from initial palliative consult to assist with pain management and symptom management. PC made patient aware that PC is unable to assist with pain management.   Pain: patient taking Oxycodone but has ran out. PCP encouraged to continue to manage pain medications. Pain clinic referral discussed as well as an option to assist with pain management. Patient shared that he was being followed by a pain management clinic in the past but was released from them due  to not passing drug screenings d/t patient smoking marijuana. Patient shares that he no longer smokes. Patient states today he is having a lot pain in his foot.  Cirrhosis: Patient is taking lactulose QOD. Patient share that he is not having any symptoms or issues due to his cirrhosis at this time. Patient scheduled to see Liver specialist 5/20.  Bone spur: to bottom R foot. Foot is very swollen and warm to touch, patient states pain is 11/10. Patient does not have any more pain medication. Patient advised to visit urgent care to address pain, swelling and open area to bottom of foot as soon as possible, patient state he will go today.  Mobility: patient is ambulatory w/ single are walker. Patient is able to complete ADL's (I). Patient declined HH therapy at this time, stating that his main concern is pain and will be a ble to do his normal day-day task once his pain is under control.  Medications: patient is out of pain medications and will outreach PCP office to make aware. Patient is holding on Lasix at this time.  Appetite: Patient share his appetite is fair now that he is taking Zofran.   Psychosocial assessment: completed.   In home support: Patient lives with spouse.   Transportation: no needs.  Food: no food insecurities witnessed.   Resources: discussed Medicare benefits as patient has been disabled for over 3 years and has had Medicaid for 3 yrs. Per [employer] (Optometrist), everyone eligible for Social Security Disability Insurance (SSDI) benefits is also eligible for Medicare after a 56-month qualifying period. SW advised patient to outreach 1-800-MEDICARE to inquire about enrolling.  Safety and long term planning: patient feels safe in his home and desires to remain in his home.    SW discussed goals, reviewed care plan, provided emotional support, used active and reflective listening in the form of reciprocity emotional response. Questions and concerns were  addressed. The patient was encouraged to call with any additional questions and/or concerns. PC Provided general support and encouragement, no other unmet needs identified.   PC will follow up in 2-3 weeks.   3.         PATIENT/CAREGIVER EDUCATION/ COPING:   Appearance: well groomed, appropriate given situation  Mental Status: Alert and oriented. Eye Contact: good  Thought Process: rational  Thought Content: not assessed  Speech: good/clear  Mood: Normal and calm Affect: Congruent to endorsed mood, full ranging Insight: good Judgement: good Interaction Style: Cooperative   Patient A&O and able to make needs known. Patient able to engage in conversation and answer all questions appropriately. PHQ9: 0 and GAD-7 is 0.     4.         PERSONAL EMERGENCY PLAN:  Patient will call 9-1-1 for emergencies.    5.         COMMUNITY RESOURCES COORDINATION/ HEALTH CARE NAVIGATION:  patient and family manages her care.    6.      FINANCIAL CONCERNS/NEEDS: None                         Primary Health Insurance: Healthy blue Medicaid Secondary Health Insurance:      SOCIAL HX:  Social History   Tobacco Use   Smoking status: Former    Packs/day: 1.00    Years: 39.00    Additional pack years: 0.00    Total pack years: 39.00    Types: Cigarettes    Quit date: 12/2015    Years since quitting: 6.8   Smokeless tobacco: Never  Substance Use Topics   Alcohol use: No    CODE STATUS: FULL CODE ADVANCED DIRECTIVES: Y. To be completed MOST FORM COMPLETE:  N HOSPICE EDUCATION PROVIDED: N  PPS: 50%  Time spent: 45 min      Tom [name], [place]

## 2022-10-24 ENCOUNTER — Ambulatory Visit: Payer: Medicaid Other | Admitting: Family Medicine

## 2022-10-24 ENCOUNTER — Ambulatory Visit: Payer: Self-pay

## 2022-10-24 DIAGNOSIS — K219 Gastro-esophageal reflux disease without esophagitis: Secondary | ICD-10-CM | POA: Diagnosis not present

## 2022-10-24 DIAGNOSIS — J449 Chronic obstructive pulmonary disease, unspecified: Secondary | ICD-10-CM | POA: Diagnosis not present

## 2022-10-24 DIAGNOSIS — I10 Essential (primary) hypertension: Secondary | ICD-10-CM | POA: Diagnosis not present

## 2022-10-24 DIAGNOSIS — S91331A Puncture wound without foreign body, right foot, initial encounter: Secondary | ICD-10-CM | POA: Diagnosis not present

## 2022-10-24 DIAGNOSIS — K7460 Unspecified cirrhosis of liver: Secondary | ICD-10-CM | POA: Diagnosis not present

## 2022-10-24 DIAGNOSIS — M7989 Other specified soft tissue disorders: Secondary | ICD-10-CM | POA: Diagnosis not present

## 2022-10-24 DIAGNOSIS — I2510 Atherosclerotic heart disease of native coronary artery without angina pectoris: Secondary | ICD-10-CM | POA: Diagnosis not present

## 2022-10-24 DIAGNOSIS — Z967 Presence of other bone and tendon implants: Secondary | ICD-10-CM | POA: Diagnosis not present

## 2022-10-24 DIAGNOSIS — Z87891 Personal history of nicotine dependence: Secondary | ICD-10-CM | POA: Diagnosis not present

## 2022-10-24 NOTE — Telephone Encounter (Signed)
Palliative did their assessment on 10/21/22. He was advised to go to [facility] or ED for acute concerns if sudden severe worsening pain.  It sounds like he requested medication to take to travel to ED? I am unsure what this request is about.  If he is needing to go to hospital due to severe pain, I would advise that he go directly instead of ordering medicine to travel to ED.  I will need time to coordinate with his Palliative Care team and help determine how to help manage his pain going forward.  Saralyn Pilar, DO [facility] 10/24/2022, 12:01 PM

## 2022-10-24 NOTE — Telephone Encounter (Signed)
  Chief Complaint: Screw from sx coming out of foot - pain bleeding Symptoms: Pain bleeding Frequency: Unsure Pertinent Negatives: Patient denies  Disposition: [x] ED /[] Urgent Care (no appt availability in office) / [] Appointment(In office/virtual)/ []  [place] [facility]/ [] Home Care/ [] Refused Recommended Disposition /[] [place] [facility]/ []  Follow-up with PCP Additional Notes: Pt was calling to get pain medication called in so he can get enough relief to get to ED. Pt  terminated call when I advised that Rx request may take 24 hours and it may be denied.  Reason for Disposition  SEVERE pain in the wound  Answer Assessment - Initial Assessment Questions 1. LOCATION: "Where is the wound located?"      Foot 2. WOUND APPEARANCE: "What does the wound look like?"      Screw from sx is coming out of foot. 3. SIZE: If redness is present, ask: "What is the size of the red area?" (Inches, centimeters, or compare to size of a coin)      unsure 4. SPREAD: "What's changed in the last day?"  "Do you see any red streaks coming from the wound?"      5. ONSET: "When did it start to look infected?"       6. 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 7. PAIN: Do you have any pain?"  If Yes, ask: "How bad is the pain?"  (e.g., Scale 1-10; mild, moderate, or severe)    - MILD (1-3): Doesn't interfere with normal activities.     - MODERATE (4-7): Interferes with normal activities or awakens from sleep.    - SEVERE (8-10): Excruciating pain, unable to do any normal activities.       severe 8. FEVER: "Do you have a fever?" If Yes, ask: "What is your temperature, how was it measured, and when did it start?"      9. OTHER SYMPTOMS: "Do you have any other symptoms?" (e.g., shaking chills, weakness, rash elsewhere on body)  Protocols used: Wound Infection Suspected-A-AH

## 2022-10-25 ENCOUNTER — Other Ambulatory Visit: Payer: Self-pay | Admitting: Family Medicine

## 2022-10-25 ENCOUNTER — Encounter: Payer: Self-pay | Admitting: *Deleted

## 2022-10-25 DIAGNOSIS — M19071 Primary osteoarthritis, right ankle and foot: Secondary | ICD-10-CM

## 2022-10-25 DIAGNOSIS — T8484XA Pain due to internal orthopedic prosthetic devices, implants and grafts, initial encounter: Secondary | ICD-10-CM | POA: Diagnosis not present

## 2022-10-25 DIAGNOSIS — M545 Low back pain, unspecified: Secondary | ICD-10-CM

## 2022-10-25 DIAGNOSIS — M5136 Other intervertebral disc degeneration, lumbar region: Secondary | ICD-10-CM

## 2022-10-25 MED ORDER — OXYCODONE HCL 5 MG PO TABS
5.0000 mg | ORAL_TABLET | ORAL | 0 refills | Status: DC | PRN
Start: 2022-10-25 — End: 2022-11-04

## 2022-10-25 NOTE — Telephone Encounter (Signed)
Requested medications are due for refill today.  Unsure  Requested medications are on the active medications list.  yes  Last refill. 10/10/2022 #[id number] [phone] rf  Future visit scheduled.   yes  Notes to clinic.  Refill not delegated.    Requested Prescriptions  Pending Prescriptions Disp Refills   oxyCODONE (OXY IR/ROXICODONE) 5 MG immediate release tablet 42 tablet 0    Sig: Take 1 tablet (5 mg total) by mouth every 4 (four) hours as needed for severe pain.     Not Delegated - Analgesics:  Opioid Agonists Failed - 10/25/2022 12:01 PM      Failed - This refill cannot be delegated      Failed - Urine Drug Screen completed in last 360 days      Passed - Valid encounter within last 3 months    Recent Outpatient Visits           5 days ago Alcoholic cirrhosis of liver with ascites [facility])   [place] [facility] [place], [name], DO   2 weeks ago Alcoholic cirrhosis of liver with ascites [facility])   [place] [facility] [place], [name], DO   1 month ago Alcoholic cirrhosis of liver with ascites [facility])   [place] [facility] [place], [name], DO   3 months ago Alcoholic cirrhosis of liver with ascites [facility])   [place] [facility] [name], DO   7 months ago Pre-diabetes   [place] [facility] Althea Charon, [name], DO       Future Appointments             In 4 weeks Althea Charon, [name], DO [facility], [place]

## 2022-10-25 NOTE — Telephone Encounter (Signed)
I called the patient.  His Oxycodone has been ordered today.  It was last filled about 2 weeks ago 4/22. He was advised I can manage it every 2 weeks if needed.  He also has podiatry/ortho managing his lower extremity with procedure.  Palliative Care will keep following and hopefully we can work together to help manage his symptoms.  Saralyn Pilar, DO [facility] 10/25/2022, 5:10 PM

## 2022-10-25 NOTE — Telephone Encounter (Signed)
Medication Refill - Medication: oxyCODONE (OXY IR/ROXICODONE) 5 MG immediate release tablet   Has the patient contacted their pharmacy? No.   Preferred Pharmacy (with phone number or street name):    [id number] [place] [place] - [address]  [address] [place] [address]  Phone: [phone] Fax: [phone]  Hours: Not open 24 hours     Has the patient been seen for an appointment in the last year OR does the patient have an upcoming appointment? Yes.    Agent: Please be advised that RX refills may take up to 3 business days. We ask that you follow-up with your pharmacy.

## 2022-10-25 NOTE — Telephone Encounter (Signed)
This encounter was created in error - please disregard.  See previous NT encounter from 10/24/22.

## 2022-10-25 NOTE — Telephone Encounter (Signed)
Patient returned call to request if PCP can prescribe pain [facility] [place]. Patient reports he is only getting a few pills at a time . Reviewed note from PCP from yesterday regarding if pain severe go to ED and he will coordinate with palliative care for pain management treatment. Patient requesting medication for pain. Please advise .

## 2022-10-26 DIAGNOSIS — Z981 Arthrodesis status: Secondary | ICD-10-CM | POA: Diagnosis not present

## 2022-10-26 DIAGNOSIS — T8484XA Pain due to internal orthopedic prosthetic devices, implants and grafts, initial encounter: Secondary | ICD-10-CM | POA: Diagnosis not present

## 2022-11-01 ENCOUNTER — Telehealth: Payer: Self-pay

## 2022-11-01 NOTE — Telephone Encounter (Signed)
Copied from CRM [id number]. Topic: General - Other >> Nov 01, 2022  8:26 AM Everette C wrote: Reason for CRM: The patient has called to follow up on their previous request for oxyCODONE (OXY IR/ROXICODONE) 5 MG immediate release tablet [[id number]]   Please contact the patient further when possible

## 2022-11-01 NOTE — Telephone Encounter (Signed)
Please notify patient  It is too early to fill Oxycodone.  I was able to manage his pain medicine about every 2 weeks, his last fill of Oxycodone was 10/25/22  Also I see he just picked up a previous Tramadol rx on 10/31/22  I will not be able to renew any pain medicine this week.  I would advise against picking up the old Tramadol rx, if there are any refills left, because as we talked before in office last, he said he was not working so we switched him OFF Tramadol and on to Oxycodone. He should not take both.  Saralyn Pilar, DO [facility] 11/01/2022, 12:59 PM

## 2022-11-01 NOTE — Telephone Encounter (Signed)
Please review.   Last refill was 10/25/2022.  Is he a week too early?  oxyCODONE (OXY IR/ROXICODONE) 5 MG immediate release tablet 42 tablet 0 10/25/2022    Sig - Route: Take 1 tablet (5 mg total) by mouth every 4 (four) hours as needed for severe pain. - Oral   Sent to pharmacy as: oxyCODONE (OXY IR/ROXICODONE) 5 MG immediate release tablet   Earliest Fill Date: 10/25/2022   E-Prescribing Status: Receipt confirmed by pharmacy (10/25/2022  5:04 PM EDT)      Thanks,   Vernona Rieger

## 2022-11-01 NOTE — Telephone Encounter (Signed)
Patient did get tramadol filled.  Advised him his oxycodone is to early to fill.  Patient understands.

## 2022-11-02 ENCOUNTER — Other Ambulatory Visit: Payer: Self-pay | Admitting: Family Medicine

## 2022-11-02 NOTE — Telephone Encounter (Signed)
Requested medication (s) are due for refill today: yes  Requested medication (s) are on the active medication list: yes  Last refill:  09/14/22  Future visit scheduled: yes  Notes to clinic:  historical med, please advise      Requested Prescriptions  Pending Prescriptions Disp Refills   folic acid (FOLVITE) 1 MG tablet      Sig: Take 1 tablet (1 mg total) by mouth daily.     Endocrinology:  Vitamins Passed - 11/02/2022 12:30 PM      Passed - Valid encounter within last 12 months    Recent Outpatient Visits           1 week ago Alcoholic cirrhosis of liver with ascites [facility])   [place] [facility] [place], [name], DO   3 weeks ago Alcoholic cirrhosis of liver with ascites [facility])   [place] [facility] [place], [name], DO   1 month ago Alcoholic cirrhosis of liver with ascites [facility])   [place] [facility] [place], [name], DO   4 months ago Alcoholic cirrhosis of liver with ascites [facility])   [place] [facility] [name], DO   7 months ago Pre-diabetes   [place] [facility] Althea Charon, [name], DO       Future Appointments             In 3 weeks Althea Charon, [name], DO [place] [facility], [place]

## 2022-11-02 NOTE — Telephone Encounter (Signed)
Medication Refill - Medication: folic acid (FOLVITE) 1 MG tablet [[id number]]    (pt stated otc is not sold in a 1mg  tablet)   Has the patient contacted their pharmacy? No     Preferred Pharmacy (with phone number or street name):  [id number] [place] [place] - [address]  [address] [place] [address]  Phone: [phone] Fax: [phone]  Hours: Not open 24 hour      Has the patient been seen for an appointment in the last year OR does the patient have an upcoming appointment? Yes.    Agent: Please be advised that RX refills may take up to 3 business days. We ask that you follow-up with your pharmacy.

## 2022-11-03 MED ORDER — FOLIC ACID 1 MG PO TABS: 1.0000 mg | ORAL_TABLET | Freq: Every day | ORAL | 3 refills | Status: DC

## 2022-11-04 ENCOUNTER — Ambulatory Visit: Payer: Self-pay

## 2022-11-04 ENCOUNTER — Ambulatory Visit: Payer: Self-pay | Admitting: *Deleted

## 2022-11-04 ENCOUNTER — Telehealth: Payer: Self-pay | Admitting: Family Medicine

## 2022-11-04 DIAGNOSIS — M19071 Primary osteoarthritis, right ankle and foot: Secondary | ICD-10-CM

## 2022-11-04 DIAGNOSIS — G8929 Other chronic pain: Secondary | ICD-10-CM

## 2022-11-04 DIAGNOSIS — M5136 Other intervertebral disc degeneration, lumbar region: Secondary | ICD-10-CM

## 2022-11-04 MED ORDER — OXYCODONE HCL 5 MG PO TABS
5.0000 mg | ORAL_TABLET | ORAL | 0 refills | Status: DC | PRN
Start: 2022-11-04 — End: 2022-11-15

## 2022-11-04 NOTE — Addendum Note (Signed)
Addended by: [name] on: 11/04/2022 05:08 PM   Modules accepted: Orders

## 2022-11-04 NOTE — Telephone Encounter (Signed)
It has been 10 days since last fill for oxycodone, it is technically a 7 day supply of medication. If he is off the Tramadol as it is not helping, I will send refill rx now.  He has palliative care nurse visit next week on 5/22  I will wait for further updates from them regarding his pain control before ordering any other medication for pain.  Please let him know that he should no longer fill Tramadol at pharmacy if it is not helping.  Saralyn Pilar, DO [facility] 11/04/2022, 5:08 PM

## 2022-11-04 NOTE — Telephone Encounter (Signed)
Reason for Disposition . Caller requesting a CONTROLLED substance prescription refill (e.g., narcotics, ADHD medicines)  Answer Assessment - Initial Assessment Questions 1. ONSET: "When did the pain start?"      Patient states he has recently had a screw removed fron ankle 2. LOCATION: "Where is the pain located?"      Right foot 3. PAIN: "How bad is the pain?"    (Scale 1-10; or mild, moderate, severe)  - MILD (1-3): doesn't interfere with normal activities.   - MODERATE (4-7): interferes with normal activities (e.g., work or school) or awakens from sleep, limping.   - SEVERE (8-10): excruciating pain, unable to do any normal activities, unable to walk.      severe 4. WORK OR EXERCISE: "Has there been any recent work or exercise that involved this part of the body?"      na 5. CAUSE: "What do you think is causing the foot pain?"     Post surgery 6. OTHER SYMPTOMS: "Do you have any other symptoms?" (e.g., leg pain, rash, fever, numbness)     no  Answer Assessment - Initial Assessment Questions 1. DRUG NAME: "What medicine do you need to have refilled?"     oxyCODONE (OXY IR/ROXICODONE) 5 MG immediate release tablet 2. REFILLS REMAINING: "How many refills are remaining?" (Note: The label on the medicine or pill bottle will show how many refills are remaining. If there are no refills remaining, then a renewal may be needed.)     none 3. EXPIRATION DATE: "What is the expiration date?" (Note: The label states when the prescription will expire, and thus can no longer be refilled.)     ns 4. PRESCRIBING HCP: "Who prescribed it?" Reason: If prescribed by specialist, call should be referred to that group.     PCP is only provider that is allowed to prescribe for patient 5. SYMPTOMS: "Do you have any symptoms?"     Severe pain post surgical  Protocols used: Foot Pain-A-AH, Medication Refill and Renewal Call-A-AH

## 2022-11-04 NOTE — Telephone Encounter (Signed)
error 

## 2022-11-04 NOTE — Telephone Encounter (Signed)
  Chief Complaint: Medication refill request Symptoms: Pain Frequency: Ongoing Pertinent Negatives: Patient denies  Disposition: [] ED /[] Urgent Care (no appt availability in office) / [] Appointment(In office/virtual)/ []  [place] [facility]/ [] Home Care/ [] Refused Recommended Disposition /[] [place] [facility]/ [x]  Follow-up with PCP Additional Notes: Pt called earlier regarding pain medication Oxycodone.  Called [facility]. Per [facility], provider has been given information and most likely has not had time to review.  Advised pt of this. Also advised pt that he may not get a call back regarding refill status.  Pt advised to go to ED for pain medication if needed.   Summary: medication due to severe pain   Patient is calling back again to make sure provider gets his meds called in as he says he can't walk to the bathroom that just how much pain he is in. He needs something done asap.      Sent teams message to [facility], Rachell and Morrie Sheldon requesting update on medication refill. Called [facility] - Spoke to Rachell - Dr. Kirtland Bouchard has been given request - He has been very busy and most likely has not yet seen request.  Reason for Disposition  [1] Prescription refill request for ESSENTIAL medicine (i.e., likelihood of harm to patient if not taken) AND [2] triager unable to refill per department policy  Answer Assessment - Initial Assessment Questions 1. DRUG NAME: "What medicine do you need to have refilled?"     Pain medication  Protocols used: Medication Refill and Renewal Call-A-AH

## 2022-11-04 NOTE — Telephone Encounter (Signed)
  Chief Complaint: patient is requesting refill of oxycodone- tramadol not helping Symptoms: foot pain- severe- unable to walk on foot- using scooter Frequency: patient last had oxycodone- 2 weeks ago  Disposition: [] ED /[] Urgent Care (no appt availability in office) / [] Appointment(In office/virtual)/ []  [place] [facility]/ [] Home Care/ [] Refused Recommended Disposition /[] [place] [facility]/ [x]  Follow-up with PCP Additional Notes: Patient states he would like PCP to call in oxycodone for him- he is in severe pain. Patient states his PCP is the only provider that can fill this medication. Patient states she will not abuse this Rx and really wished he did not need it- patient requesting Rx : [facility]

## 2022-11-07 NOTE — Telephone Encounter (Signed)
This is a duplicate message.  Please see my entry on the other 11/04/22 phone call. I ordered his medication already.  Thank you  Saralyn Pilar, DO [facility] 11/07/2022, 9:48 AM

## 2022-11-08 DIAGNOSIS — T8484XA Pain due to internal orthopedic prosthetic devices, implants and grafts, initial encounter: Secondary | ICD-10-CM | POA: Diagnosis not present

## 2022-11-09 ENCOUNTER — Other Ambulatory Visit: Payer: Medicaid Other

## 2022-11-09 VITALS — BP 130/78 | HR 108 | Temp 98.2°F

## 2022-11-09 DIAGNOSIS — Z515 Encounter for palliative care: Secondary | ICD-10-CM

## 2022-11-09 NOTE — Progress Notes (Signed)
COMMUNITY PALLIATIVE CARE SW NOTE  PATIENT NAME: Tom [name] DOB: 02-18-1966 MRN: [id number]  PRIMARY CARE PROVIDER: [name], DO  RESPONSIBLE PARTY:  Acct ID - Guarantor Home Phone Work Phone Relationship Acct Type  [id number] Hurman Horn* [phone]  Self P/F     [address], [place], [place] [address]     PLAN OF CARE and INTERVENTIONS:              Palliative care encounter: joint PC visit with patient and PC RN Raynelle Fanning, B.  Pain: patient continue taking Oxycodone. PCP encouraged to continue to manage pain medications.    Cirrhosis: Patient is taking lactulose QOD. Patient share that he is not having any symptoms or issues due to his cirrhosis at this time. Patient saw liver specialist on 5/20, no major changes, patient has been advised to stop taking folic acid.  Parenthesis: patient is scheduled to have fluid drawn off on Thu. Patient is holding fluid in his belly and states he can tell it is time to have fluid drawn off due to it effecting his breathing.   Bone spur: to bottom R foot. Foot is very swollen and warm to touch, patient states pain is 9/10. Patient does not have any more pain medication.    Mobility: patient is ambulatory w/ single are walker. Patient is able to complete ADL's (I). Patient declined HH therapy at this time, stating that his main concern is pain and will be a ble to do his normal day-day task once his pain is under control.   Medications: patient is out of pain medications and will outreach PCP office to make aware. Patient is holding on Lasix at this time.   Appetite: Patient share his appetite is fair now especially after stopping folic acid.   Resource: patient advised/remiNded to call 1-800 MEDICARE.     SOCIAL HX:  Social History   Tobacco Use   Smoking status: Former    Packs/day: 1.00    Years: 39.00    Additional pack years: 0.00    Total pack years: 39.00    Types: Cigarettes    Quit date: 12/2015     Years since quitting: 6.8   Smokeless tobacco: Never  Substance Use Topics   Alcohol use: No        [name], [place]

## 2022-11-09 NOTE — Progress Notes (Signed)
PATIENT NAME: Tom [name] DOB: 09-Nov-1965 MRN: [id number]  PRIMARY CARE PROVIDER: [name], DO  RESPONSIBLE PARTY:  Acct ID - Guarantor Home Phone Work Phone Relationship Acct Type  [id number] Hurman Horn* [phone]  Self P/F     [address], [place], [place] [address]    Joint visit completed with [place], SW and patient.  Cirrhosis:  Will have paracentesis this week. Last one was in March per patient. Patient advised it has been 7 months since he drank alcohol.  Taking lactulose every other day.   Functional Status:  Using a rolling walker for stability.  No recent falls.  Patient is independent with grooming, hygiene, toileting and dressing.  Is able to do light household chores.  Not driving at this time.   Right foot pain r/t previous orthopedic surgery:  Pain rating averages 9-10 when up ambulating.  Shooting and throbbing pain reported to right foot.  Currently taking oxycodone 5 mg up to 3 x daily per patient.  States his wife keeps the oxycodone with her at work and only leaves 3 in the home during the day.   Previously tried long acting pain medication but patient did not like the side effects.  Very little pain at night and pain is better when leg is elevated however he reports foot gets very cold when elevated at times. Oxycodone is refilled about every 2 weeks.  Patient discussed quality of life and wanting to live as normal as possible.      CODE STATUS: Full ADVANCED DIRECTIVES: Yes MOST FORM: No PPS: 50%   PHYSICAL EXAM:   VITALS: Today's Vitals   11/09/22 1246  BP: 130/78  Pulse: (!) 108  Temp: 98.2 F (36.8 C)  SpO2: 96%    LUNGS: clear to auscultation  CARDIAC: Cor RRR}  EXTREMITIES: 2+ bilateral lower extremity edema SKIN: Skin color, texture, turgor normal. No rashes or lesions, mobility and turgor normal, or right ankle with previous surgical wound, right heel remains closed.   NEURO: positive for gait  problems       Truitt Merle, RN

## 2022-11-12 DIAGNOSIS — I2510 Atherosclerotic heart disease of native coronary artery without angina pectoris: Secondary | ICD-10-CM | POA: Diagnosis not present

## 2022-11-12 DIAGNOSIS — Z888 Allergy status to other drugs, medicaments and biological substances status: Secondary | ICD-10-CM | POA: Diagnosis not present

## 2022-11-12 DIAGNOSIS — G8929 Other chronic pain: Secondary | ICD-10-CM | POA: Diagnosis not present

## 2022-11-12 DIAGNOSIS — R262 Difficulty in walking, not elsewhere classified: Secondary | ICD-10-CM | POA: Diagnosis not present

## 2022-11-12 DIAGNOSIS — R0902 Hypoxemia: Secondary | ICD-10-CM | POA: Diagnosis not present

## 2022-11-12 DIAGNOSIS — J9811 Atelectasis: Secondary | ICD-10-CM | POA: Diagnosis not present

## 2022-11-12 DIAGNOSIS — M79673 Pain in unspecified foot: Secondary | ICD-10-CM | POA: Diagnosis not present

## 2022-11-12 DIAGNOSIS — M2559 Pain in other specified joint: Secondary | ICD-10-CM | POA: Diagnosis not present

## 2022-11-12 DIAGNOSIS — R0602 Shortness of breath: Secondary | ICD-10-CM | POA: Diagnosis not present

## 2022-11-12 DIAGNOSIS — Z7951 Long term (current) use of inhaled steroids: Secondary | ICD-10-CM | POA: Diagnosis not present

## 2022-11-12 DIAGNOSIS — R6881 Early satiety: Secondary | ICD-10-CM | POA: Diagnosis not present

## 2022-11-12 DIAGNOSIS — I10 Essential (primary) hypertension: Secondary | ICD-10-CM | POA: Diagnosis not present

## 2022-11-12 DIAGNOSIS — J449 Chronic obstructive pulmonary disease, unspecified: Secondary | ICD-10-CM | POA: Diagnosis not present

## 2022-11-12 DIAGNOSIS — L97419 Non-pressure chronic ulcer of right heel and midfoot with unspecified severity: Secondary | ICD-10-CM | POA: Diagnosis not present

## 2022-11-12 DIAGNOSIS — R600 Localized edema: Secondary | ICD-10-CM | POA: Diagnosis not present

## 2022-11-12 DIAGNOSIS — R918 Other nonspecific abnormal finding of lung field: Secondary | ICD-10-CM | POA: Diagnosis not present

## 2022-11-12 DIAGNOSIS — Z91030 Bee allergy status: Secondary | ICD-10-CM | POA: Diagnosis not present

## 2022-11-12 DIAGNOSIS — M25571 Pain in right ankle and joints of right foot: Secondary | ICD-10-CM | POA: Diagnosis not present

## 2022-11-13 DIAGNOSIS — M25572 Pain in left ankle and joints of left foot: Secondary | ICD-10-CM | POA: Diagnosis not present

## 2022-11-13 DIAGNOSIS — Z888 Allergy status to other drugs, medicaments and biological substances status: Secondary | ICD-10-CM | POA: Diagnosis not present

## 2022-11-13 DIAGNOSIS — Z91030 Bee allergy status: Secondary | ICD-10-CM | POA: Diagnosis not present

## 2022-11-13 DIAGNOSIS — G8929 Other chronic pain: Secondary | ICD-10-CM | POA: Diagnosis not present

## 2022-11-13 DIAGNOSIS — Z9889 Other specified postprocedural states: Secondary | ICD-10-CM | POA: Diagnosis not present

## 2022-11-13 DIAGNOSIS — I10 Essential (primary) hypertension: Secondary | ICD-10-CM | POA: Diagnosis not present

## 2022-11-13 DIAGNOSIS — R0602 Shortness of breath: Secondary | ICD-10-CM | POA: Diagnosis not present

## 2022-11-13 DIAGNOSIS — R140 Abdominal distension (gaseous): Secondary | ICD-10-CM | POA: Diagnosis not present

## 2022-11-13 DIAGNOSIS — Z87891 Personal history of nicotine dependence: Secondary | ICD-10-CM | POA: Diagnosis not present

## 2022-11-14 DIAGNOSIS — R188 Other ascites: Secondary | ICD-10-CM | POA: Diagnosis not present

## 2022-11-14 DIAGNOSIS — R0602 Shortness of breath: Secondary | ICD-10-CM | POA: Diagnosis not present

## 2022-11-15 ENCOUNTER — Telehealth: Payer: Self-pay | Admitting: Family Medicine

## 2022-11-15 ENCOUNTER — Observation Stay
Admission: EM | Admit: 2022-11-15 | Discharge: 2022-11-16 | Disposition: A | Discharge status: Home / Self Care | Payer: Medicaid Other | Attending: Internal Medicine | Admitting: Internal Medicine

## 2022-11-15 ENCOUNTER — Emergency Department: Payer: Medicaid Other

## 2022-11-15 ENCOUNTER — Ambulatory Visit: Payer: Self-pay | Admitting: *Deleted

## 2022-11-15 ENCOUNTER — Other Ambulatory Visit: Payer: Self-pay

## 2022-11-15 ENCOUNTER — Encounter: Payer: Self-pay | Admitting: Emergency Medicine

## 2022-11-15 DIAGNOSIS — T8489XA Other specified complication of internal orthopedic prosthetic devices, implants and grafts, initial encounter: Secondary | ICD-10-CM | POA: Diagnosis not present

## 2022-11-15 DIAGNOSIS — I2510 Atherosclerotic heart disease of native coronary artery without angina pectoris: Secondary | ICD-10-CM | POA: Diagnosis not present

## 2022-11-15 DIAGNOSIS — J45909 Unspecified asthma, uncomplicated: Secondary | ICD-10-CM | POA: Diagnosis not present

## 2022-11-15 DIAGNOSIS — R7989 Other specified abnormal findings of blood chemistry: Secondary | ICD-10-CM

## 2022-11-15 DIAGNOSIS — N179 Acute kidney failure, unspecified: Secondary | ICD-10-CM | POA: Insufficient documentation

## 2022-11-15 DIAGNOSIS — Y828 Other medical devices associated with adverse incidents: Secondary | ICD-10-CM | POA: Insufficient documentation

## 2022-11-15 DIAGNOSIS — J449 Chronic obstructive pulmonary disease, unspecified: Secondary | ICD-10-CM | POA: Diagnosis not present

## 2022-11-15 DIAGNOSIS — Z79899 Other long term (current) drug therapy: Secondary | ICD-10-CM | POA: Insufficient documentation

## 2022-11-15 DIAGNOSIS — M545 Low back pain, unspecified: Secondary | ICD-10-CM

## 2022-11-15 DIAGNOSIS — M79671 Pain in right foot: Secondary | ICD-10-CM | POA: Diagnosis present

## 2022-11-15 DIAGNOSIS — R7303 Prediabetes: Secondary | ICD-10-CM | POA: Diagnosis present

## 2022-11-15 DIAGNOSIS — K7031 Alcoholic cirrhosis of liver with ascites: Secondary | ICD-10-CM | POA: Diagnosis present

## 2022-11-15 DIAGNOSIS — I10 Essential (primary) hypertension: Secondary | ICD-10-CM | POA: Insufficient documentation

## 2022-11-15 DIAGNOSIS — R0602 Shortness of breath: Secondary | ICD-10-CM | POA: Diagnosis not present

## 2022-11-15 DIAGNOSIS — T849XXA Unspecified complication of internal orthopedic prosthetic device, implant and graft, initial encounter: Secondary | ICD-10-CM | POA: Diagnosis not present

## 2022-11-15 DIAGNOSIS — R7402 Elevation of levels of lactic acid dehydrogenase (LDH): Secondary | ICD-10-CM | POA: Diagnosis not present

## 2022-11-15 DIAGNOSIS — M5136 Other intervertebral disc degeneration, lumbar region: Secondary | ICD-10-CM

## 2022-11-15 DIAGNOSIS — J4489 Other specified chronic obstructive pulmonary disease: Secondary | ICD-10-CM | POA: Diagnosis present

## 2022-11-15 DIAGNOSIS — R188 Other ascites: Secondary | ICD-10-CM | POA: Diagnosis not present

## 2022-11-15 DIAGNOSIS — Z515 Encounter for palliative care: Secondary | ICD-10-CM

## 2022-11-15 DIAGNOSIS — R600 Localized edema: Secondary | ICD-10-CM | POA: Diagnosis not present

## 2022-11-15 DIAGNOSIS — M19071 Primary osteoarthritis, right ankle and foot: Secondary | ICD-10-CM

## 2022-11-15 DIAGNOSIS — Z87891 Personal history of nicotine dependence: Secondary | ICD-10-CM

## 2022-11-15 DIAGNOSIS — M7989 Other specified soft tissue disorders: Secondary | ICD-10-CM | POA: Diagnosis not present

## 2022-11-15 LAB — CBC WITH DIFFERENTIAL/PLATELET
Abs Immature Granulocytes: 0.01 10*3/uL (ref 0.00–0.07)
Basophils Absolute: 0.1 10*3/uL (ref 0.0–0.1)
Basophils Relative: 1 %
Eosinophils Absolute: 0.3 10*3/uL (ref 0.0–0.5)
Eosinophils Relative: 5 %
HCT: 32.9 % — ABNORMAL LOW (ref 39.0–52.0)
Hemoglobin: 11 g/dL — ABNORMAL LOW (ref 13.0–17.0)
Immature Granulocytes: 0 %
Lymphocytes Relative: 26 %
Lymphs Abs: 1.7 10*3/uL (ref 0.7–4.0)
MCH: 29 pg (ref 26.0–34.0)
MCHC: 33.4 g/dL (ref 30.0–36.0)
MCV: 86.8 fL (ref 80.0–100.0)
Monocytes Absolute: 0.8 10*3/uL (ref 0.1–1.0)
Monocytes Relative: 12 %
Neutro Abs: 3.7 10*3/uL (ref 1.7–7.7)
Neutrophils Relative %: 56 %
Platelets: 164 10*3/uL (ref 150–400)
RBC: 3.79 MIL/uL — ABNORMAL LOW (ref 4.22–5.81)
RDW: 14.6 % (ref 11.5–15.5)
WBC: 6.7 10*3/uL (ref 4.0–10.5)
nRBC: 0 % (ref 0.0–0.2)

## 2022-11-15 LAB — COMPREHENSIVE METABOLIC PANEL
ALT: 12 U/L (ref 0–44)
AST: 35 U/L (ref 15–41)
Albumin: 2.7 g/dL — ABNORMAL LOW (ref 3.5–5.0)
Alkaline Phosphatase: 74 U/L (ref 38–126)
Anion gap: 7 (ref 5–15)
BUN: 18 mg/dL (ref 6–20)
CO2: 25 mmol/L (ref 22–32)
Calcium: 8.5 mg/dL — ABNORMAL LOW (ref 8.9–10.3)
Chloride: 104 mmol/L (ref 98–111)
Creatinine, Ser: 1.74 mg/dL — ABNORMAL HIGH (ref 0.61–1.24)
GFR, Estimated: 45 mL/min — ABNORMAL LOW (ref >60)
Glucose, Bld: 148 mg/dL — ABNORMAL HIGH (ref 70–99)
Potassium: 4.2 mmol/L (ref 3.5–5.1)
Sodium: 136 mmol/L (ref 135–145)
Total Bilirubin: 1.7 mg/dL — ABNORMAL HIGH (ref 0.3–1.2)
Total Protein: 6 g/dL — ABNORMAL LOW (ref 6.5–8.1)

## 2022-11-15 LAB — CBC
HCT: 32.2 % — ABNORMAL LOW (ref 39.0–52.0)
Hemoglobin: 10.5 g/dL — ABNORMAL LOW (ref 13.0–17.0)
MCH: 28.4 pg (ref 26.0–34.0)
MCHC: 32.6 g/dL (ref 30.0–36.0)
MCV: 87 fL (ref 80.0–100.0)
Platelets: 160 10*3/uL (ref 150–400)
RBC: 3.7 MIL/uL — ABNORMAL LOW (ref 4.22–5.81)
RDW: 14.8 % (ref 11.5–15.5)
WBC: 7.2 10*3/uL (ref 4.0–10.5)
nRBC: 0 % (ref 0.0–0.2)

## 2022-11-15 LAB — URINALYSIS, COMPLETE (UACMP) WITH MICROSCOPIC
Bilirubin Urine: NEGATIVE
Glucose, UA: NEGATIVE mg/dL
Ketones, ur: NEGATIVE mg/dL
Leukocytes,Ua: NEGATIVE
Nitrite: NEGATIVE
Protein, ur: 30 mg/dL — AB
RBC / HPF: >50 RBC/hpf (ref 0–5)
Specific Gravity, Urine: 1.02 (ref 1.005–1.030)
pH: 6 (ref 5.0–8.0)

## 2022-11-15 LAB — LACTIC ACID, PLASMA
Lactic Acid, Venous: 2.7 mmol/L (ref 0.5–1.9)
Lactic Acid, Venous: 2.4 mmol/L (ref 0.5–1.9)

## 2022-11-15 LAB — C-REACTIVE PROTEIN: CRP: 1 mg/dL — ABNORMAL HIGH (ref <1.0)

## 2022-11-15 LAB — CREATININE, SERUM
Creatinine, Ser: 1.54 mg/dL — ABNORMAL HIGH (ref 0.61–1.24)
GFR, Estimated: 52 mL/min — ABNORMAL LOW (ref >60)

## 2022-11-15 LAB — SEDIMENTATION RATE: Sed Rate: 27 mm/hr — ABNORMAL HIGH (ref 0–20)

## 2022-11-15 MED ORDER — OXYCODONE HCL 5 MG PO TABS: 10.0000 mg | ORAL_TABLET | ORAL | Status: DC

## 2022-11-15 MED ORDER — OXYCODONE HCL 5 MG PO TABS
5.0000 mg | ORAL_TABLET | ORAL | Status: AC
  Administered 2022-11-15: 5 mg via ORAL
  Filled 2022-11-15: qty 1

## 2022-11-15 MED ORDER — ATORVASTATIN CALCIUM 20 MG PO TABS
40.0000 mg | ORAL_TABLET | Freq: Every day | ORAL | Status: DC
  Administered 2022-11-16: 40 mg via ORAL
  Filled 2022-11-15: qty 2

## 2022-11-15 MED ORDER — OXYCODONE HCL 5 MG PO TABS
5.0000 mg | ORAL_TABLET | ORAL | 0 refills | Status: DC | PRN
Start: 2022-11-15 — End: 2022-11-16

## 2022-11-15 MED ORDER — HYDROXYZINE HCL 10 MG PO TABS
25.0000 mg | ORAL_TABLET | Freq: Three times a day (TID) | ORAL | Status: DC | PRN
  Administered 2022-11-16: 25 mg via ORAL
  Filled 2022-11-15 (×2): qty 3

## 2022-11-15 MED ORDER — ACETAMINOPHEN 650 MG RE SUPP: 650.0000 mg | Freq: Four times a day (QID) | RECTAL | Status: DC | PRN

## 2022-11-15 MED ORDER — ALBUTEROL SULFATE (2.5 MG/3ML) 0.083% IN NEBU: 2.5000 mg | INHALATION_SOLUTION | RESPIRATORY_TRACT | Status: DC | PRN

## 2022-11-15 MED ORDER — ONDANSETRON HCL 4 MG PO TABS: 4.0000 mg | ORAL_TABLET | Freq: Four times a day (QID) | ORAL | Status: DC | PRN

## 2022-11-15 MED ORDER — MORPHINE SULFATE (PF) 4 MG/ML IV SOLN
4.0000 mg | Freq: Once | INTRAVENOUS | Status: AC
  Administered 2022-11-15: 4 mg via INTRAVENOUS
  Filled 2022-11-15: qty 1

## 2022-11-15 MED ORDER — CARVEDILOL 3.125 MG PO TABS
3.1250 mg | ORAL_TABLET | Freq: Two times a day (BID) | ORAL | Status: DC
  Administered 2022-11-16: 3.125 mg via ORAL
  Filled 2022-11-15: qty 1

## 2022-11-15 MED ORDER — ONDANSETRON HCL 4 MG/2ML IJ SOLN: 4.0000 mg | Freq: Four times a day (QID) | INTRAMUSCULAR | Status: DC | PRN

## 2022-11-15 MED ORDER — FAMOTIDINE 20 MG PO TABS: 20.0000 mg | ORAL_TABLET | Freq: Every day | ORAL | Status: DC

## 2022-11-15 MED ORDER — PANTOPRAZOLE SODIUM 40 MG PO TBEC
40.0000 mg | DELAYED_RELEASE_TABLET | Freq: Every day | ORAL | Status: DC
  Administered 2022-11-16: 40 mg via ORAL
  Filled 2022-11-15: qty 1

## 2022-11-15 MED ORDER — SODIUM CHLORIDE 0.9 % IV BOLUS
500.0000 mL | Freq: Once | INTRAVENOUS | Status: AC
  Administered 2022-11-15: 500 mL via INTRAVENOUS

## 2022-11-15 MED ORDER — SPIRONOLACTONE 25 MG PO TABS
100.0000 mg | ORAL_TABLET | Freq: Every day | ORAL | Status: DC
  Administered 2022-11-16: 100 mg via ORAL
  Filled 2022-11-15: qty 4

## 2022-11-15 MED ORDER — ACETAMINOPHEN 325 MG PO TABS
650.0000 mg | ORAL_TABLET | Freq: Four times a day (QID) | ORAL | Status: DC | PRN
  Administered 2022-11-15: 650 mg via ORAL
  Filled 2022-11-15: qty 2

## 2022-11-15 MED ORDER — LACTULOSE 10 GM/15ML PO SOLN
10.0000 g | ORAL | Status: DC
  Administered 2022-11-16: 10 g via ORAL
  Filled 2022-11-15: qty 30

## 2022-11-15 MED ORDER — FUROSEMIDE 20 MG PO TABS
20.0000 mg | ORAL_TABLET | Freq: Every day | ORAL | Status: DC
  Administered 2022-11-16: 20 mg via ORAL
  Filled 2022-11-15: qty 1

## 2022-11-15 MED ORDER — ENOXAPARIN SODIUM 60 MG/0.6ML IJ SOSY
50.0000 mg | PREFILLED_SYRINGE | INTRAMUSCULAR | Status: DC
  Administered 2022-11-15: 50 mg via SUBCUTANEOUS
  Filled 2022-11-15: qty 0.6

## 2022-11-15 MED ORDER — OXYCODONE HCL 5 MG PO TABS
5.0000 mg | ORAL_TABLET | ORAL | Status: DC | PRN
  Administered 2022-11-15: 5 mg via ORAL
  Filled 2022-11-15: qty 1

## 2022-11-15 NOTE — ED Notes (Signed)
EDP at [facility], pt alert, NAD, calm, interactive, participatory.

## 2022-11-15 NOTE — Assessment & Plan Note (Addendum)
Holding spironolactone, lactulose and furosemide for up to 24 hours while hydrating for AKI

## 2022-11-15 NOTE — Assessment & Plan Note (Addendum)
Creatinine 1.7 up from baseline 0.81 a month prior Suspect prerenal related to spironolactone and Lasix for management of cirrhosis, dehydration related to lactulose Hydrate, monitor, avoid nephrotoxins Monitor intake and output

## 2022-11-15 NOTE — H&P (Signed)
History and Physical    Patient: Tom [name]:[id number] DOB: 03-26-1966 DOA: 11/15/2022 DOS: the patient was seen and examined on 11/15/2022 PCP: [name], DO  Patient coming from: Home  Chief Complaint:  Chief Complaint  Patient presents with   Post-op Problem    HPI: Tom [name] is a 57 y.o. male with medical history significant for DM, HTN, cirrhosis of the liver, CAD, COPD, with prior arthrodesis of the right ankle complicated by chronic right foot pain for which she follows with [facility] who presents to the ED for persistent foot pain and running out of his pain medication.  Patient has a current problem with a screw from his surgery protruding from his right heel and is supposed to be non-bearing on this leg, however he did some walking and now he has pain and has noticed some oozing in the area.  He denies fever or chills ED course and data review: Vitals within normal limits.  WBC normal with elevated lactic acid of 2.4.  Sed rate 27.CMP notable for creatinine of 1.7 up from his baseline of 0.81 a month ago.  LFTs notable for total bilirubin of 1.7.  Hemoglobin 11, down from his baseline of 12.6.  Urinalysis not consistent with infection. Right lower extremity Doppler negative for DVT CT right foot and ankle showing chronic findings as outlined as follows: IMPRESSION: 1. Marked periosteal reaction about the distal tibia and fibula and multiple anteroposterior screws across the distal tibia, likely sequela of chronic infectious/inflammatory process. 2. There are 2 anteroposterior screws for arthrodesis of the talonavicular joint. There is however loosening about the screws in the talus as well as in the calcaneus suspicious for infectious/inflammatory process. 3. Osseous fragmentation of the talus posterior to the ghost track for prior hardware. 4. Marked skin thickening and subcutaneous soft tissue edema about the distal leg as well as  along the ankle/foot. No fluid collection or abscess on this noncontrast enhanced examination. 5. Generalized muscle atrophy of the distal leg muscles. 6. Degenerative changes of the first metatarsophalangeal joint.   The ED provider spoke with both orthopedics and podiatry.  They both feel that the findings are likely chronic but podiatry agrees to see patient in the a.m.  Patient was given pain medication and was also given an IV fluid bolus for his AKI. Hospitalist consulted for admission.   Review of Systems: As mentioned in the history of present illness. All other systems reviewed and are negative.  Past Medical History:  Diagnosis Date   Arthritis    COPD (chronic obstructive pulmonary disease) (HCC)    Hyperlipidemia    Hypertension    Osteoporosis    Past Surgical History:  Procedure Laterality Date   ANKLE FUSION Right 2017   BACK SURGERY     TOTAL KNEE ARTHROPLASTY Right 04/2012   Social History:  reports that he quit smoking about 6 years ago. His smoking use included cigarettes. He has a 39.00 pack-year smoking history. He has never used smokeless tobacco. He reports that he does not drink alcohol. No history on file for drug use.  Allergies  Allergen Reactions   Alfuzosin Other (See Comments)    Dizziness    Amlodipine Nausea And Vomiting   Bee Venom Swelling   Cymbalta [Duloxetine Hcl] Other (See Comments)    Emesis    Duloxetine Other (See Comments)    Emesis   Lisinopril    Flomax [Tamsulosin Hcl] Itching    History reviewed. No pertinent family  history.  Prior to Admission medications   Medication Sig Start Date End Date Taking? Authorizing Provider  albuterol (PROVENTIL) (2.5 MG/3ML) 0.083% nebulizer solution Take 3 mLs (2.5 mg total) by nebulization every 4 (four) hours as needed for wheezing or shortness of breath. Patient not taking: Reported on 10/19/2022 05/25/21   Nyoka Cowden, MD  albuterol (VENTOLIN HFA) 108 (90 Base) MCG/ACT inhaler INHALE 2  PUFFS BY MOUTH EVERY 4 HOURS AS NEEDED FOR WHEEZING FOR SHORTNESS OF BREATH 03/21/22   [name], [name], DO  atorvastatin (LIPITOR) 40 MG tablet Take 1 tablet (40 mg total) by mouth daily. 03/21/22   [name], [name], DO  budesonide-formoterol (SYMBICORT) 80-4.5 MCG/ACT inhaler Take 2 puffs first thing in am and then another 2 puffs about 12 hours later. 03/21/22   [name], [name], DO  carvedilol (COREG) 3.125 MG tablet Take 3.125 mg by mouth 2 (two) times daily. 09/14/22   [provider]  EPINEPHrine (EPIPEN 2-PAK) 0.3 mg/0.3 mL IJ SOAJ injection Inject 0.3 mg into the muscle as needed for anaphylaxis. Patient not taking: Reported on 10/19/2022 12/16/21   [name], DO  famotidine (PEPCID) 20 MG tablet Take 1 tablet (20 mg total) by mouth daily after supper. One after supper 03/21/22   [name], DO  folic acid (FOLVITE) 1 MG tablet Take 1 tablet (1 mg total) by mouth daily. 11/03/22   [name], [name], DO  furosemide (LASIX) 20 MG tablet Take 1 tablet (20 mg total) by mouth daily. 10/20/22   [name], DO  Homeopathic Products (LIVER SUPPORT SL) Place under the tongue.    [provider]  hydrOXYzine (VISTARIL) 25 MG capsule Take 1 capsule (25 mg total) by mouth every 8 (eight) hours as needed. 10/10/22   Althea Charon, [name], DO  lactulose (CHRONULAC) 10 GM/15ML solution SMARTSIG:15 Milliliter(s) By Mouth Every Other Day 10/20/22   [name], DO  magnesium oxide (MAG-OX) 400 (240 Mg) MG tablet Take 2 tablets by mouth 2 (two) times daily. 09/14/22   [provider]  Multiple Vitamin (MULTIVITAMIN) capsule Take 1 capsule by mouth daily.    [provider]  ondansetron (ZOFRAN-ODT) 4 MG disintegrating tablet Take 1 tablet (4 mg total) by mouth every 8 (eight) hours as needed for nausea or vomiting. 10/10/22   Althea Charon, [name], DO  oxyCODONE (OXY IR/ROXICODONE) 5 MG immediate  release tablet Take 1 tablet (5 mg total) by mouth every 4 (four) hours as needed for severe pain. 11/15/22   [name], [name], DO  pantoprazole (PROTONIX) 40 MG tablet Take 1 tablet (40 mg total) by mouth daily before breakfast. 06/01/22   Althea Charon, [name], DO  spironolactone (ALDACTONE) 25 MG tablet Take 1 tablet (25 mg total) by mouth daily. 10/20/22   [name], DO    Physical Exam: Vitals:   11/15/22 1900 11/15/22 1915 11/15/22 2033 11/15/22 2100  BP: 134/79 127/72 128/76   Pulse: 84 83 85   Resp: 16 16 18    Temp:   98.4 F (36.9 C)   TempSrc:   Oral   SpO2: 100% 97% 100%   Weight:    103 kg   Physical Exam Vitals and nursing note reviewed.  Constitutional:      General: He is not in acute distress. HENT:     Head: Normocephalic and atraumatic.  Cardiovascular:     Rate and Rhythm: Normal rate and regular rhythm.     Heart sounds: Normal heart  sounds.  Pulmonary:     Effort: Pulmonary effort is normal.     Breath sounds: Normal breath sounds.  Abdominal:     Palpations: Abdomen is soft.     Tenderness: There is no abdominal tenderness.  Musculoskeletal:     Comments: See pic  Neurological:     Mental Status: Mental status is at baseline.        Labs on Admission: I have personally reviewed following labs and imaging studies  CBC: Recent Labs  Lab 11/15/22 1432  WBC 6.7  NEUTROABS 3.7  HGB 11.0*  HCT 32.9*  MCV 86.8  PLT 164   Basic Metabolic Panel: Recent Labs  Lab 11/15/22 1432  NA 136  K 4.2  CL 104  CO2 25  GLUCOSE 148*  BUN 18  CREATININE 1.74*  CALCIUM 8.5*   GFR: Estimated Creatinine Clearance: 56.6 mL/min (A) (by C-G formula based on SCr of 1.74 mg/dL (H)). Liver Function Tests: Recent Labs  Lab 11/15/22 1432  AST 35  ALT 12  ALKPHOS 74  BILITOT 1.7*  PROT 6.0*  ALBUMIN 2.7*   No results for input(s): "LIPASE", "AMYLASE" in the last 168 hours. No results for input(s): "AMMONIA" in the last 168  hours. Coagulation Profile: No results for input(s): "INR", "PROTIME" in the last 168 hours. Cardiac Enzymes: No results for input(s): "CKTOTAL", "CKMB", "CKMBINDEX", "TROPONINI" in the last 168 hours. BNP (last 3 results) No results for input(s): "PROBNP" in the last 8760 hours. HbA1C: No results for input(s): "HGBA1C" in the last 72 hours. CBG: No results for input(s): "GLUCAP" in the last 168 hours. Lipid Profile: No results for input(s): "CHOL", "HDL", "LDLCALC", "TRIG", "CHOLHDL", "LDLDIRECT" in the last 72 hours. Thyroid Function Tests: No results for input(s): "TSH", "T4TOTAL", "FREET4", "T3FREE", "THYROIDAB" in the last 72 hours. Anemia Panel: No results for input(s): "VITAMINB12", "FOLATE", "FERRITIN", "TIBC", "IRON", "RETICCTPCT" in the last 72 hours. Urine analysis:    Component Value Date/Time   COLORURINE YELLOW (A) 11/15/2022 1810   APPEARANCEUR HAZY (A) 11/15/2022 1810   LABSPEC 1.020 11/15/2022 1810   PHURINE 6.0 11/15/2022 1810   GLUCOSEU NEGATIVE 11/15/2022 1810   HGBUR LARGE (A) 11/15/2022 1810   BILIRUBINUR NEGATIVE 11/15/2022 1810   BILIRUBINUR Negative 08/24/2021 1516   KETONESUR NEGATIVE 11/15/2022 1810   PROTEINUR 30 (A) 11/15/2022 1810   UROBILINOGEN 0.2 08/24/2021 1516   NITRITE NEGATIVE 11/15/2022 1810   LEUKOCYTESUR NEGATIVE 11/15/2022 1810    Radiological Exams on Admission: US Venous Img Lower Unilateral Right  Result Date: 11/15/2022 CLINICAL DATA:  Edema and swelling EXAM: RIGHT LOWER EXTREMITY VENOUS DOPPLER ULTRASOUND TECHNIQUE: Gray-scale sonography with graded compression, as well as color Doppler and duplex ultrasound were performed to evaluate the lower extremity deep venous systems from the level of the common femoral vein and including the common femoral, femoral, profunda femoral, popliteal and calf veins including the posterior tibial, peroneal and gastrocnemius veins when visible. Spectral Doppler was utilized to evaluate flow at rest  and with distal augmentation maneuvers in the common femoral, femoral and popliteal veins. COMPARISON:  None Available. FINDINGS: Contralateral Common Femoral Vein: Respiratory phasicity is normal and symmetric with the symptomatic side. No evidence of thrombus. Normal compressibility. Common Femoral Vein: No evidence of thrombus. Normal compressibility, respiratory phasicity and response to augmentation. Saphenofemoral Junction: No evidence of thrombus. Normal compressibility and flow on color Doppler imaging. Profunda Femoral Vein: No evidence of thrombus. Normal compressibility and flow on color Doppler imaging. Femoral Vein: No evidence of thrombus.  Normal compressibility, respiratory phasicity and response to augmentation. Popliteal Vein: No evidence of thrombus. Normal compressibility, respiratory phasicity and response to augmentation. Calf Veins: No evidence of thrombus. Normal compressibility and flow on color Doppler imaging. IMPRESSION: No evidence of deep venous thrombosis. Electronically Signed   By: Judie Petit.  Shick M.D.   On: 11/15/2022 18:39   CT Foot Right Wo Contrast  Result Date: 11/15/2022 CLINICAL DATA:  Foot swelling, diabetic, osteomyelitis suspected. Patient had right ankle fusion a proximally 3 weeks ago. EXAM: CT OF THE RIGHT ANKLE WITHOUT CONTRAST CT OF THE RIGHT FOOT WITHOUT CONTRAST TECHNIQUE: Multidetector CT imaging of the right ANKLE AND foot was performed according to the standard protocol. Multiplanar CT image reconstructions were also generated. RADIATION DOSE REDUCTION: This exam was performed according to the departmental dose-optimization program which includes automated exposure control, adjustment of the mA and/or kV according to patient size and/or use of iterative reconstruction technique. COMPARISON:  None Available. FINDINGS: Right ankle/foot: Bones/Joint/Cartilage There is marked periosteal reaction about the distal tibia and fibula and multiple anteroposterior screws across  the distal tibia. There is ghosting artifact from the prior hardware in the posterior to mid talus body and calcaneus. Osseous fragmentation of the talus is noted posterior to the ghost track for prior hardware. There are 2 anteroposterior screws for arthrodesis of the talonavicular joint. There is however loosening about the screws in the talus as well as in the calcaneus suspicious for infectious/inflammatory process. There are degenerative changes of the talonavicular joint. Calcaneocuboid joint and tarsometatarsal joints are intact. Degenerative changes of the first metatarsophalangeal joint. Remaining distal foot joints appear intact. Ligaments Suboptimally assessed by CT. Muscles and Tendons Generalized muscle atrophy of the distal leg muscles. Achilles tendon is intact. Tendons of the flexor, extensor and peroneal compartments also appear maintained. Soft tissues There is marked skin thickening and subcutaneous soft tissue edema about the distal leg as well as along the ankle/foot. No fluid collection or abscess on this noncontrast enhanced examination. IMPRESSION: 1. Marked periosteal reaction about the distal tibia and fibula and multiple anteroposterior screws across the distal tibia, likely sequela of chronic infectious/inflammatory process. 2. There are 2 anteroposterior screws for arthrodesis of the talonavicular joint. There is however loosening about the screws in the talus as well as in the calcaneus suspicious for infectious/inflammatory process. 3. Osseous fragmentation of the talus posterior to the ghost track for prior hardware. 4. Marked skin thickening and subcutaneous soft tissue edema about the distal leg as well as along the ankle/foot. No fluid collection or abscess on this noncontrast enhanced examination. 5. Generalized muscle atrophy of the distal leg muscles. 6. Degenerative changes of the first metatarsophalangeal joint. Electronically Signed   By: Larose Hires D.O.   On: 11/15/2022  17:03   CT Ankle Right Wo Contrast  Result Date: 11/15/2022 CLINICAL DATA:  Foot swelling, diabetic, osteomyelitis suspected. Patient had right ankle fusion a proximally 3 weeks ago. EXAM: CT OF THE RIGHT ANKLE WITHOUT CONTRAST CT OF THE RIGHT FOOT WITHOUT CONTRAST TECHNIQUE: Multidetector CT imaging of the right ANKLE AND foot was performed according to the standard protocol. Multiplanar CT image reconstructions were also generated. RADIATION DOSE REDUCTION: This exam was performed according to the departmental dose-optimization program which includes automated exposure control, adjustment of the mA and/or kV according to patient size and/or use of iterative reconstruction technique. COMPARISON:  None Available. FINDINGS: Right ankle/foot: Bones/Joint/Cartilage There is marked periosteal reaction about the distal tibia and fibula and multiple anteroposterior screws across the  distal tibia. There is ghosting artifact from the prior hardware in the posterior to mid talus body and calcaneus. Osseous fragmentation of the talus is noted posterior to the ghost track for prior hardware. There are 2 anteroposterior screws for arthrodesis of the talonavicular joint. There is however loosening about the screws in the talus as well as in the calcaneus suspicious for infectious/inflammatory process. There are degenerative changes of the talonavicular joint. Calcaneocuboid joint and tarsometatarsal joints are intact. Degenerative changes of the first metatarsophalangeal joint. Remaining distal foot joints appear intact. Ligaments Suboptimally assessed by CT. Muscles and Tendons Generalized muscle atrophy of the distal leg muscles. Achilles tendon is intact. Tendons of the flexor, extensor and peroneal compartments also appear maintained. Soft tissues There is marked skin thickening and subcutaneous soft tissue edema about the distal leg as well as along the ankle/foot. No fluid collection or abscess on this noncontrast  enhanced examination. IMPRESSION: 1. Marked periosteal reaction about the distal tibia and fibula and multiple anteroposterior screws across the distal tibia, likely sequela of chronic infectious/inflammatory process. 2. There are 2 anteroposterior screws for arthrodesis of the talonavicular joint. There is however loosening about the screws in the talus as well as in the calcaneus suspicious for infectious/inflammatory process. 3. Osseous fragmentation of the talus posterior to the ghost track for prior hardware. 4. Marked skin thickening and subcutaneous soft tissue edema about the distal leg as well as along the ankle/foot. No fluid collection or abscess on this noncontrast enhanced examination. 5. Generalized muscle atrophy of the distal leg muscles. 6. Degenerative changes of the first metatarsophalangeal joint. Electronically Signed   By: Larose Hires D.O.   On: 11/15/2022 17:03     Data Reviewed: Relevant notes from primary care and specialist visits, past discharge summaries as available in EHR, including Care Everywhere. Prior diagnostic testing as pertinent to current admission diagnoses Updated medications and problem lists for reconciliation ED course, including vitals, labs, imaging, treatment and response to treatment Triage notes, nursing and pharmacy notes and ED provider's notes Notable results as noted in HPI   Assessment and Plan: * Complication associated with orthopedic device (HCC) Right foot pain with history of arthrodesis right ankle and foot 12/2021 - Chronic findings on CT right ankle and foot - Per review of orthopedic note from 10/21/2022: ."..nonweightbearing across the right leg. Discussed the screw protruding through the bone into the area of the soft tissue...potential damage to the soft tissue with a screw protruding to this area...(at)...this point there is no signs of infection or open wounds ..." -Patient with persistent pain, reported oozing.  Normal WBC, but  lactic acid 2.4 and sed rate 27 -ED provider spoke with both Ortho and podiatry who think it is chronic - Will hold off on antibiotics for now - Podiatry consulted as they agreed to see patient in the a.m. - Pain control -Keep leg elevated  Elevated lactic acid level Suspect nonsepsis related.  Possible dehydration IV hydration and monitor  AKI (acute kidney injury) (HCC) Creatinine 1.7 up from baseline 0.81 a month prior Suspect prerenal related to spironolactone and Lasix for management of cirrhosis, dehydration related to lactulose Hydrate, monitor, avoid nephrotoxins Monitor intake and output  Alcoholic cirrhosis of liver with ascites (HCC) Holding spironolactone, lactulose and furosemide for up to 24 hours while hydrating for AKI   Asthmatic bronchitis , chronic Albuterol as needed  Pre-diabetes Hold metformin due to renal function  Essential hypertension Hold diuretics Continue carvedilol  Atherosclerosis of native coronary artery  of native heart without angina pectoris Continue carvedilol and atorvastatin     DVT prophylaxis: Lovenox  Consults: Podiatry, Dr. Logan Bores  Advance Care Planning:   Code Status: Full Code   Family Communication: none  Disposition Plan: Back to previous home environment  Severity of Illness: The appropriate patient status for this patient is OBSERVATION. Observation status is judged to be reasonable and necessary in order to provide the required intensity of service to ensure the patient's safety. The patient's presenting symptoms, physical exam findings, and initial radiographic and laboratory data in the context of their medical condition is felt to place them at decreased risk for further clinical deterioration. Furthermore, it is anticipated that the patient will be medically stable for discharge from the hospital within 2 midnights of admission.   Author: Andris Baumann, MD 11/15/2022 9:07 PM  For on call review [email or web address].

## 2022-11-15 NOTE — Telephone Encounter (Signed)
Signed order for re order Oxycodone, inc pill count to 60 pills per rx, and he can plan to go to ED as discussed today he came in to office for walk in, he had worsening swelling.   We will follow up with Palliative and he has upcoming apt next week with me  But he did agree to go to hospital ED today for evaluation and may need drainage of fluid.  I will continue his oxycodone as needed for now.  Saralyn Pilar, DO [facility] 11/15/2022, 1:49 PM

## 2022-11-15 NOTE — Assessment & Plan Note (Addendum)
Right foot pain with history of arthrodesis right ankle and foot 12/2021 - Chronic findings on CT right ankle and foot - Per review of orthopedic note from 10/21/2022: ."..nonweightbearing across the right leg. Discussed the screw protruding through the bone into the area of the soft tissue...potential damage to the soft tissue with a screw protruding to this area...(at)...this point there is no signs of infection or open wounds ..." -Patient with persistent pain, reported oozing.  Normal WBC, but lactic acid 2.4 and sed rate 27 -ED provider spoke with both Ortho and podiatry who think it is chronic - Will hold off on antibiotics for now - Podiatry consulted as they agreed to see patient in the a.m. - Pain control -Keep leg elevated

## 2022-11-15 NOTE — Assessment & Plan Note (Signed)
Albuterol as needed 

## 2022-11-15 NOTE — Assessment & Plan Note (Signed)
Hold metformin due to renal function

## 2022-11-15 NOTE — Assessment & Plan Note (Signed)
Continue carvedilol and atorvastatin

## 2022-11-15 NOTE — Assessment & Plan Note (Signed)
Suspect nonsepsis related.  Possible dehydration IV hydration and monitor

## 2022-11-15 NOTE — Telephone Encounter (Signed)
  Chief Complaint: requesting refill pain medication oxycodone  Symptoms: ankle pain and swelling. 11/14/22 had 7 and 1/2 liters fluid removed at [facility] and now ankle pain worse. Patient has been taking more medication at times than prescribed due to pain increased. Now out of medication. Pus noted from ankle wound. Taking antibiotics per ortho.  Frequency: today  Pertinent Negatives: Patient denies fever.  Disposition: [] ED /[] Urgent Care (no appt availability in office) / [] Appointment(In office/virtual)/ []  [place] [facility]/ [] Home Care/ [] Refused Recommended Disposition /[] [facility]/ [x]  Follow-up with PCP Additional Notes:   Requesting refill for oxycodone last refill 11/04/22. Patient has taken more than prescribed due to pain and infection. Please advise if appt needed. No appt available until 11/21/22 and patient already has appt 11/23/22. Patient out of pain medication.     Reason for Disposition  [1] Redness of the skin AND [2] no fever  Answer Assessment - Initial Assessment Questions 1. ONSET: "When did the pain start?"      Worsening pain due to infection 2. LOCATION: "Where is the pain located?"      Ankle  3. PAIN: "How bad is the pain?"    (Scale 1-10; or mild, moderate, severe)  - MILD (1-3): doesn't interfere with normal activities.   - MODERATE (4-7): interferes with normal activities (e.g., work or school) or awakens from sleep, limping.   - SEVERE (8-10): excruciating pain, unable to do any normal activities, unable to walk.      Difficulty walking , infected ankle / hardware, taking antibiotics per ortho 4. WORK OR EXERCISE: "Has there been any recent work or exercise that involved this part of the body?"      na 5. CAUSE: "What do you think is causing the ankle pain?"     Infection and out of medication for pain 6. OTHER SYMPTOMS: "Do you have any other symptoms?" (e.g., calf pain, rash, fever, swelling)     Ankle swelling, pus oozing out,  redness 7. PREGNANCY: "Is there any chance you are pregnant?" "When was your last menstrual period?"     na  Protocols used: Ankle Pain-A-AH

## 2022-11-15 NOTE — ED Notes (Signed)
Venous doppler at BS 

## 2022-11-15 NOTE — ED Triage Notes (Signed)
Patient to ED via POV for post-op problem. Patient had right ankle fusion approx 3 weeks ago. Sent by PCP for possible infection and increased swelling. Patient states he was unable to walk on it due to pain. Currently out of pain meds and can't get more till Thursday. Been taking 4 pills a day instead of 3.

## 2022-11-15 NOTE — Assessment & Plan Note (Signed)
Hold diuretics Continue carvedilol

## 2022-11-15 NOTE — ED Notes (Signed)
EDP at BS 

## 2022-11-15 NOTE — ED Provider Notes (Signed)
[facility] Provider Note    Event Date/Time   First MD Initiated Contact with Patient 11/15/22 1522     (approximate)   History   Post-op Problem   HPI  Tom [name] is a 57 y.o. male with a history of cirrhosis and previous right foot injury with ankle and foot fusion in July of last year  Patient reports that he started having increasing pain and swelling after walking or [facility] hospitals after therapeutic paracentesis 2 days ago.  No abdominal pain no fevers or chills.  He reports that he had a previous problem and a chronic pain in the right foot relative to poor healing of the right foot after surgery.  No chest pain no shortness of breath.  No numbness.  He reports severe pain in the right foot and typically takes hydrocodone but has been running out early for some time now and ran out today.  Went to primary care offer office was not able to be seen by his primary care today, and was referred to the ER for further evaluation  Called and spoke with patient's primary care doctor, Lucie Leather.  He advised that he is not able to formally see the patient today.  He did refill the patient's oxycodone and the patient is now engaged with palliative therapy as well.  He reports patient does have a known history of slow healing or complicated issue with the right ankle     Physical Exam   Triage Vital Signs: ED Triage Vitals  Enc Vitals Group     BP 11/15/22 1422 118/79     Pulse Rate 11/15/22 1422 89     Resp 11/15/22 1422 18     Temp 11/15/22 1422 98.4 F (36.9 C)     Temp Source 11/15/22 1422 Oral     SpO2 11/15/22 1422 100 %     Weight --      Height --      Head Circumference --      Peak Flow --      Pain Score 11/15/22 1420 10     Pain Loc --      Pain Edu? --      Excl. in GC? --     Most recent vital signs: Vitals:   11/15/22 1900 11/15/22 1915  BP: 134/79 127/72  Pulse: 84 83  Resp: 16 16  Temp:    SpO2: 100% 97%      General: Awake, no distress.  CV:  Good peripheral perfusion.  Strong easily dopplerable right posterior tibial and dorsalis pedis pulses with normal capillary fill and right foot.  Normal heart tones Resp:  Normal effort.  Clear bilaterally Abd:  No distention.  Soft nontender Other:  Slight edema in both lower feet which patient reports to be chronic, except the right lower extremity from about the ankle through the foot has moderate edema.  No erythema.  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  He reports at times he will see drainage from it, it is not presently draining noted about the size of a perhaps 2 to 3 mm across and unknown depth.  No surrounding erythema.  No necrosis.  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  in nature   ED Results / Procedures / Treatments   Labs (all labs ordered are listed, but only abnormal results are displayed) Labs Reviewed  LACTIC ACID, PLASMA - Abnormal; Notable for the following components:      Result Value   Lactic Acid, Venous 2.7 (*)    All other components within normal limits  LACTIC ACID, PLASMA - Abnormal; Notable for the following components:   Lactic Acid, Venous 2.4 (*)    All other components within normal limits  COMPREHENSIVE METABOLIC PANEL - Abnormal; Notable for the following components:   Glucose, Bld 148 (*)    Creatinine, Ser 1.74 (*)    Calcium 8.5 (*)    Total Protein 6.0 (*)    Albumin 2.7 (*)    Total Bilirubin 1.7 (*)    GFR, Estimated 45 (*)    All other components within normal limits  CBC WITH DIFFERENTIAL/PLATELET - Abnormal; Notable for the following components:   RBC 3.79 (*)    Hemoglobin 11.0 (*)    HCT 32.9 (*)    All other components within normal limits  SEDIMENTATION RATE - Abnormal;  Notable for the following components:   Sed Rate 27 (*)    All other components within normal limits  URINALYSIS, COMPLETE (UACMP) WITH MICROSCOPIC - Abnormal; Notable for the following components:   Color, Urine YELLOW (*)    APPearance HAZY (*)    Hgb urine dipstick LARGE (*)    Protein, ur 30 (*)    Bacteria, UA RARE (*)    All other components within normal limits  CULTURE, BLOOD (ROUTINE X 2)  CULTURE, BLOOD (ROUTINE X 2)  C-REACTIVE PROTEIN    RADIOLOGY  I personally viewed the patient's CT of the right foot but defer interpretation to the radiologist given the complexity  US Venous Img Lower Unilateral Right  Result Date: 11/15/2022 CLINICAL DATA:  Edema and swelling EXAM: RIGHT LOWER EXTREMITY VENOUS DOPPLER ULTRASOUND TECHNIQUE: Gray-scale sonography with graded compression, as well as color Doppler and duplex ultrasound were performed to evaluate the lower extremity deep venous systems from the level of the common femoral vein and including the common femoral, femoral, profunda femoral, popliteal and calf veins including the posterior tibial, peroneal and gastrocnemius veins when visible. Spectral Doppler was utilized to evaluate flow at rest and with distal augmentation maneuvers in the common femoral, femoral and popliteal veins. COMPARISON:  None Available. FINDINGS: Contralateral Common Femoral Vein: Respiratory phasicity is normal and symmetric with the symptomatic side. No evidence of thrombus. Normal compressibility. Common Femoral Vein: No evidence of thrombus. Normal compressibility, respiratory phasicity and response to augmentation. Saphenofemoral Junction: No evidence of thrombus. Normal compressibility and flow on color Doppler imaging. Profunda Femoral Vein: No evidence of thrombus. Normal compressibility and flow on color Doppler imaging. Femoral Vein: No evidence of thrombus. Normal compressibility, respiratory phasicity and response to augmentation. Popliteal Vein: No  evidence of thrombus. Normal compressibility, respiratory phasicity and response to augmentation. Calf Veins: No evidence of thrombus. Normal compressibility and flow on color Doppler imaging. IMPRESSION: No evidence of deep venous thrombosis. Electronically Signed   By: Judie Petit.  Shick M.D.   On: 11/15/2022 18:39   CT Foot Right Wo Contrast  Result Date: 11/15/2022 CLINICAL DATA:  Foot swelling, diabetic, osteomyelitis suspected. Patient had right ankle fusion a proximally 3 weeks ago. EXAM: CT OF THE RIGHT ANKLE WITHOUT CONTRAST CT OF THE RIGHT FOOT WITHOUT CONTRAST TECHNIQUE: Multidetector CT imaging of the right ANKLE AND foot was performed  according to the standard protocol. Multiplanar CT image reconstructions were also generated. RADIATION DOSE REDUCTION: This exam was performed according to the departmental dose-optimization program which includes automated exposure control, adjustment of the mA and/or kV according to patient size and/or use of iterative reconstruction technique. COMPARISON:  None Available. FINDINGS: Right ankle/foot: Bones/Joint/Cartilage There is marked periosteal reaction about the distal tibia and fibula and multiple anteroposterior screws across the distal tibia. There is ghosting artifact from the prior hardware in the posterior to mid talus body and calcaneus. Osseous fragmentation of the talus is noted posterior to the ghost track for prior hardware. There are 2 anteroposterior screws for arthrodesis of the talonavicular joint. There is however loosening about the screws in the talus as well as in the calcaneus suspicious for infectious/inflammatory process. There are degenerative changes of the talonavicular joint. Calcaneocuboid joint and tarsometatarsal joints are intact. Degenerative changes of the first metatarsophalangeal joint. Remaining distal foot joints appear intact. Ligaments Suboptimally assessed by CT. Muscles and Tendons Generalized muscle atrophy of the distal leg  muscles. Achilles tendon is intact. Tendons of the flexor, extensor and peroneal compartments also appear maintained. Soft tissues There is marked skin thickening and subcutaneous soft tissue edema about the distal leg as well as along the ankle/foot. No fluid collection or abscess on this noncontrast enhanced examination. IMPRESSION: 1. Marked periosteal reaction about the distal tibia and fibula and multiple anteroposterior screws across the distal tibia, likely sequela of chronic infectious/inflammatory process. 2. There are 2 anteroposterior screws for arthrodesis of the talonavicular joint. There is however loosening about the screws in the talus as well as in the calcaneus suspicious for infectious/inflammatory process. 3. Osseous fragmentation of the talus posterior to the ghost track for prior hardware. 4. Marked skin thickening and subcutaneous soft tissue edema about the distal leg as well as along the ankle/foot. No fluid collection or abscess on this noncontrast enhanced examination. 5. Generalized muscle atrophy of the distal leg muscles. 6. Degenerative changes of the first metatarsophalangeal joint. Electronically Signed   By: Larose Hires D.O.   On: 11/15/2022 17:03   CT Ankle Right Wo Contrast  Result Date: 11/15/2022 CLINICAL DATA:  Foot swelling, diabetic, osteomyelitis suspected. Patient had right ankle fusion a proximally 3 weeks ago. EXAM: CT OF THE RIGHT ANKLE WITHOUT CONTRAST CT OF THE RIGHT FOOT WITHOUT CONTRAST TECHNIQUE: Multidetector CT imaging of the right ANKLE AND foot was performed according to the standard protocol. Multiplanar CT image reconstructions were also generated. RADIATION DOSE REDUCTION: This exam was performed according to the departmental dose-optimization program which includes automated exposure control, adjustment of the mA and/or kV according to patient size and/or use of iterative reconstruction technique. COMPARISON:  None Available. FINDINGS: Right ankle/foot:  Bones/Joint/Cartilage There is marked periosteal reaction about the distal tibia and fibula and multiple anteroposterior screws across the distal tibia. There is ghosting artifact from the prior hardware in the posterior to mid talus body and calcaneus. Osseous fragmentation of the talus is noted posterior to the ghost track for prior hardware. There are 2 anteroposterior screws for arthrodesis of the talonavicular joint. There is however loosening about the screws in the talus as well as in the calcaneus suspicious for infectious/inflammatory process. There are degenerative changes of the talonavicular joint. Calcaneocuboid joint and tarsometatarsal joints are intact. Degenerative changes of the first metatarsophalangeal joint. Remaining distal foot joints appear intact. Ligaments Suboptimally assessed by CT. Muscles and Tendons Generalized muscle atrophy of the distal leg muscles. Achilles tendon is intact.  Tendons of the flexor, extensor and peroneal compartments also appear maintained. Soft tissues There is marked skin thickening and subcutaneous soft tissue edema about the distal leg as well as along the ankle/foot. No fluid collection or abscess on this noncontrast enhanced examination. IMPRESSION: 1. Marked periosteal reaction about the distal tibia and fibula and multiple anteroposterior screws across the distal tibia, likely sequela of chronic infectious/inflammatory process. 2. There are 2 anteroposterior screws for arthrodesis of the talonavicular joint. There is however loosening about the screws in the talus as well as in the calcaneus suspicious for infectious/inflammatory process. 3. Osseous fragmentation of the talus posterior to the ghost track for prior hardware. 4. Marked skin thickening and subcutaneous soft tissue edema about the distal leg as well as along the ankle/foot. No fluid collection or abscess on this noncontrast enhanced examination. 5. Generalized muscle atrophy of the distal leg  muscles. 6. Degenerative changes of the first metatarsophalangeal joint. Electronically Signed   By: Larose Hires D.O.   On: 11/15/2022 17:03        PROCEDURES:  Critical Care performed: No  Procedures   MEDICATIONS ORDERED IN ED: Medications  sodium chloride 0.9 % bolus 500 mL (0 mLs Intravenous Stopped 11/15/22 1640)  oxyCODONE (Oxy IR/ROXICODONE) immediate release tablet 5 mg (5 mg Oral Given 11/15/22 1613)  oxyCODONE (Oxy IR/ROXICODONE) immediate release tablet 5 mg (5 mg Oral Given 11/15/22 1808)  morphine (PF) 4 MG/ML injection 4 mg (4 mg Intravenous Given 11/15/22 1925)  sodium chloride 0.9 % bolus 500 mL (0 mLs Intravenous Stopped 11/15/22 2004)     IMPRESSION / MDM / ASSESSMENT AND PLAN / ED COURSE  I reviewed the triage vital signs and the nursing notes.                              Differential diagnosis includes, but is not limited to, new acute on chronic inflammation, hardware malfunction, osteomyelitis potentially of a chronic component, infection, or other causation of orthopedic pain.  Presently no evidence of DVT or arterial compromise.  No clear evidence of infection.  He reports intermittent drainage from the site on the heel but this seems to have some chronicity to it.  He does report increasing pain and swelling though.  His white count is normal she is afebrile.  I discussed with her podiatrist, the advisability see the patient in consult and to consider that the could potentially perform biopsy of the site to evaluate further for other cause for possible osteomyelitis but defer to them and their expertise.  At this juncture I do not have clear indication to initiate antibiotics.  Urinalysis rare bacteria some red cells and slightly elevated white cells with no nitrites or leukocytes, no associated dysuria suspect unlikely to represent UTI  Labs do demonstrate a somewhat unexpected elevated lactic acid and acute kidney injury.  Patient reports intermittent issues with  his kidneys recently related to his cirrhosis and hospitalizations at [facility] of similar nature.  His previous creatinine at [facility] was approximately 0.9.  Does appear to have an acute component.  Will hydrate.  Given his associated likely chronic potential subacute pain in the right foot for potential infectious cause though seems more inflammatory in nature, discussed with our hospitalist and patient will be admitted for anticipated podiatry consult by Dr. Logan Bores and further care and management regarding AKI under the hospitalist service.  Patient understand agreeable with plan  Patient's presentation is most  consistent with acute complicated illness / injury requiring diagnostic workup.    The patient is on the cardiac monitor to evaluate for evidence of arrhythmia and/or significant heart rate changes.     ----------------------------------------- 7:03 PM on 11/15/2022 ----------------------------------------- Discussed case with orthopedics Dr. Audelia Acton, he reviewed imaging and clinical history and labs and advises there is likely a chronic process involving the right foot and ankle.  I am doubtful that there is an acute issue but advises it certainly appears the patient has a failed fusion. Recommends podiatry consult. Para March  FINAL CLINICAL IMPRESSION(S) / ED DIAGNOSES   Final diagnoses:  Elevated lactic acid level  AKI (acute kidney injury) (HCC)  Right foot pain     Rx / DC Orders   ED Discharge Orders     None        Note:  This document was prepared using Dragon voice recognition software and may include unintentional dictation errors.   Sharyn Creamer, MD 11/15/22 2018

## 2022-11-16 DIAGNOSIS — T849XXA Unspecified complication of internal orthopedic prosthetic device, implant and graft, initial encounter: Secondary | ICD-10-CM | POA: Diagnosis not present

## 2022-11-16 LAB — CBC WITH DIFFERENTIAL/PLATELET
Abs Immature Granulocytes: 0.02 10*3/uL (ref 0.00–0.07)
Basophils Absolute: 0.1 10*3/uL (ref 0.0–0.1)
Basophils Relative: 2 %
Eosinophils Absolute: 0.6 10*3/uL — ABNORMAL HIGH (ref 0.0–0.5)
Eosinophils Relative: 7 %
HCT: 33.9 % — ABNORMAL LOW (ref 39.0–52.0)
Hemoglobin: 11.3 g/dL — ABNORMAL LOW (ref 13.0–17.0)
Immature Granulocytes: 0 %
Lymphocytes Relative: 31 %
Lymphs Abs: 2.4 10*3/uL (ref 0.7–4.0)
MCH: 28.7 pg (ref 26.0–34.0)
MCHC: 33.3 g/dL (ref 30.0–36.0)
MCV: 86 fL (ref 80.0–100.0)
Monocytes Absolute: 1 10*3/uL (ref 0.1–1.0)
Monocytes Relative: 13 %
Neutro Abs: 3.6 10*3/uL (ref 1.7–7.7)
Neutrophils Relative %: 47 %
Platelets: 186 10*3/uL (ref 150–400)
RBC: 3.94 MIL/uL — ABNORMAL LOW (ref 4.22–5.81)
RDW: 14.6 % (ref 11.5–15.5)
WBC: 7.7 10*3/uL (ref 4.0–10.5)
nRBC: 0 % (ref 0.0–0.2)

## 2022-11-16 LAB — BASIC METABOLIC PANEL
Anion gap: 8 (ref 5–15)
BUN: 17 mg/dL (ref 6–20)
CO2: 22 mmol/L (ref 22–32)
Calcium: 8.5 mg/dL — ABNORMAL LOW (ref 8.9–10.3)
Chloride: 106 mmol/L (ref 98–111)
Creatinine, Ser: 1.63 mg/dL — ABNORMAL HIGH (ref 0.61–1.24)
GFR, Estimated: 49 mL/min — ABNORMAL LOW (ref >60)
Glucose, Bld: 99 mg/dL (ref 70–99)
Potassium: 3.5 mmol/L (ref 3.5–5.1)
Sodium: 136 mmol/L (ref 135–145)

## 2022-11-16 LAB — CULTURE, BLOOD (ROUTINE X 2): Culture: NO GROWTH

## 2022-11-16 LAB — HIV ANTIBODY (ROUTINE TESTING W REFLEX): HIV Screen 4th Generation wRfx: NONREACTIVE

## 2022-11-16 MED ORDER — OXYCODONE HCL 5 MG PO TABS
5.0000 mg | ORAL_TABLET | ORAL | Status: DC | PRN
  Administered 2022-11-16: 10 mg via ORAL
  Filled 2022-11-16: qty 2

## 2022-11-16 MED ORDER — ALBUTEROL SULFATE (2.5 MG/3ML) 0.083% IN NEBU: 2.5000 mg | INHALATION_SOLUTION | RESPIRATORY_TRACT | 0 refills | Status: AC | PRN

## 2022-11-16 MED ORDER — OXYCODONE HCL 5 MG PO TABS: 15.0000 mg | ORAL_TABLET | ORAL | Status: DC | PRN

## 2022-11-16 MED ORDER — OXYCODONE HCL 15 MG PO TABS: 15.0000 mg | ORAL_TABLET | ORAL | 0 refills | Status: DC | PRN

## 2022-11-16 MED ORDER — HYDROMORPHONE HCL 1 MG/ML IJ SOLN: 0.5000 mg | INTRAMUSCULAR | Status: DC | PRN

## 2022-11-16 MED ORDER — METHOCARBAMOL 500 MG PO TABS: 500.0000 mg | ORAL_TABLET | Freq: Three times a day (TID) | ORAL | 0 refills | Status: AC

## 2022-11-16 MED ORDER — OXYCODONE HCL 5 MG PO TABS
10.0000 mg | ORAL_TABLET | ORAL | Status: DC | PRN
  Administered 2022-11-16 (×2): 10 mg via ORAL
  Filled 2022-11-16 (×2): qty 2

## 2022-11-16 MED ORDER — DICLOFENAC SODIUM 1 % EX GEL
4.0000 g | Freq: Four times a day (QID) | CUTANEOUS | Status: DC
  Administered 2022-11-16: 4 g via TOPICAL
  Filled 2022-11-16: qty 100

## 2022-11-16 MED ORDER — ALBUMIN HUMAN 25 % IV SOLN
12.5000 g | Freq: Once | INTRAVENOUS | Status: AC
  Administered 2022-11-16: 12.5 g via INTRAVENOUS
  Filled 2022-11-16: qty 50

## 2022-11-16 MED ORDER — METHOCARBAMOL 500 MG PO TABS
500.0000 mg | ORAL_TABLET | Freq: Three times a day (TID) | ORAL | Status: DC
  Administered 2022-11-16: 500 mg via ORAL
  Filled 2022-11-16: qty 1

## 2022-11-16 MED ORDER — OXYCODONE HCL 5 MG PO TABS: 10.0000 mg | ORAL_TABLET | ORAL | Status: DC | PRN

## 2022-11-16 MED ORDER — KETOROLAC TROMETHAMINE 15 MG/ML IJ SOLN
15.0000 mg | Freq: Four times a day (QID) | INTRAMUSCULAR | Status: DC
  Administered 2022-11-16: 15 mg via INTRAVENOUS
  Filled 2022-11-16: qty 1

## 2022-11-16 MED ORDER — DICLOFENAC SODIUM 1 % EX GEL: 4.0000 g | Freq: Four times a day (QID) | CUTANEOUS | 0 refills | Status: DC

## 2022-11-16 NOTE — Progress Notes (Signed)
PT Cancellation Note  Patient Details Name: Tom [name] MRN: [id number] DOB: [birth year]   Cancelled Treatment:    Reason Eval/Treat Not Completed: Other (comment). Awaiting podiatry consult, PT to hold evaluation at this time.   Olga Coaster PT, DPT 8:13 AM,11/16/22

## 2022-11-16 NOTE — Discharge Summary (Signed)
Physician Discharge Summary  Cotey Ham Laske ZOX:[id number] DOB: 08-Apr-1966 DOA: 11/15/2022  PCP: [name], DO  Admit date: 11/15/2022 Discharge date: 11/16/2022  Admitted From: Home Disposition:  Home  Recommendations for Outpatient Follow-up:  Follow up with PCP in 1-2 weeks Follow-up with orthopedic surgery 1 week  Home Health: No Equipment/Devices: None  Discharge Condition: Stable CODE STATUS: Full Diet recommendation: Carb mod  Brief/Interim Summary:  57 y.o. male with medical history significant for DM, HTN, cirrhosis of the liver, CAD, COPD, with prior arthrodesis of the right ankle complicated by chronic right foot pain for which she follows with [facility] who presents to the ED for persistent foot pain and running out of his pain medication.  Patient has a current problem with a screw from his surgery protruding from his right heel and is supposed to be non-bearing on this leg, however he did some walking and now he has pain and has noticed some oozing in the area.  He denies fever or chills   Images reviewed.  Patient seen in consultation by podiatry.  No overt evidence of infection.  Suspect malunion.  Patient has a follow-up with orthopedic surgery as outpatient scheduled.  Podiatry informed him that he is welcome to follow-up with their practice as outpatient as well.  Patient is on chronic clindamycin so no changes and antimicrobial therapy recommended at this time.  Pain regimen ordered on discharge.  Recommend initiation of muscle relaxant to minimize narcotic use.    Discharge Diagnoses:  Principal Problem:   Complication associated with orthopedic device [facility]) Active Problems:   Right foot pain   Elevated lactic acid level   AKI (acute kidney injury) (HCC)   Atherosclerosis of native coronary artery of native heart without angina pectoris   Essential hypertension   Pre-diabetes   Asthmatic bronchitis , chronic   Alcoholic cirrhosis of  liver with ascites (HCC)  Intractable pain Complication associated with orthopedic device History of arthrodesis right foot and ankle Patient presented for pain and decreased ability to ambulate of the right lower extremity.  Pain regiment adjusted.  Podiatry consult appreciated.  No plans for surgical intervention.  Low suspicion for infected hardware.  Suspect malunion.  Patient has scheduled follow-up with orthopedics within a week.  He is also instructed that he can follow-up with podiatry practice as needed.  Discharge Instructions  Discharge Instructions     Ambulatory Referral for Lung Cancer Scre   Complete by: As directed    Diet - low sodium heart healthy   Complete by: As directed    Increase activity slowly   Complete by: As directed       Allergies as of 11/16/2022       Reactions   Alfuzosin Other (See Comments)   Dizziness    Amlodipine Nausea And Vomiting   Bee Venom Swelling   Cymbalta [duloxetine Hcl] Other (See Comments)   Emesis   Duloxetine Other (See Comments)   Emesis   Lisinopril    Flomax [tamsulosin Hcl] Itching        Medication List     STOP taking these medications    ibuprofen 800 MG tablet Commonly known as: ADVIL   traMADol 50 MG tablet Commonly known as: ULTRAM       TAKE these medications    albuterol (2.5 MG/3ML) 0.083% nebulizer solution Commonly known as: PROVENTIL Take 3 mLs (2.5 mg total) by nebulization every 4 (four) hours as needed for wheezing or shortness of breath. What changed:  Another medication with the same name was removed. Continue taking this medication, and follow the directions you see here.   atorvastatin 40 MG tablet Commonly known as: LIPITOR Take 1 tablet (40 mg total) by mouth daily.   budesonide-formoterol 80-4.5 MCG/ACT inhaler Commonly known as: Symbicort Take 2 puffs first thing in am and then another 2 puffs about 12 hours later.   carvedilol 3.125 MG tablet Commonly known as: COREG Take  3.125 mg by mouth 2 (two) times daily.   clindamycin 300 MG capsule Commonly known as: CLEOCIN   diclofenac Sodium 1 % Gel Commonly known as: VOLTAREN Apply 4 g topically 4 (four) times daily.   DSS 100 MG Caps TAKE 1 CAPSULE (100 MG TOTAL) BY MOUTH TWO (2) TIMES A DAY   EPINEPHrine 0.3 mg/0.3 mL Soaj injection Commonly known as: EpiPen 2-Pak Inject 0.3 mg into the muscle as needed for anaphylaxis.   famotidine 20 MG tablet Commonly known as: Pepcid Take 1 tablet (20 mg total) by mouth daily after supper. One after supper   folic acid 1 MG tablet Commonly known as: FOLVITE Take 1 tablet (1 mg total) by mouth daily.   furosemide 20 MG tablet Commonly known as: LASIX Take 1 tablet (20 mg total) by mouth daily. What changed: Another medication with the same name was removed. Continue taking this medication, and follow the directions you see here.   gabapentin 100 MG capsule Commonly known as: NEURONTIN Take 100 mg by mouth 3 (three) times daily.   hydrOXYzine 25 MG capsule Commonly known as: VISTARIL Take 1 capsule (25 mg total) by mouth every 8 (eight) hours as needed.   lactulose 10 GM/15ML solution Commonly known as: CHRONULAC SMARTSIG:15 Milliliter(s) By Mouth Every Other Day   lidocaine 5 % Commonly known as: LIDODERM Place 1 patch onto the skin daily.   LIVER SUPPORT SL Place under the tongue.   magnesium oxide 400 (240 Mg) MG tablet Commonly known as: MAG-OX Take 2 tablets by mouth 2 (two) times daily.   methocarbamol 500 MG tablet Commonly known as: ROBAXIN Take 1 tablet (500 mg total) by mouth 3 (three) times daily for 14 days.   multivitamin capsule Take 1 capsule by mouth daily.   ondansetron 4 MG disintegrating tablet Commonly known as: ZOFRAN-ODT Take 1 tablet (4 mg total) by mouth every 8 (eight) hours as needed for nausea or vomiting.   oxyCODONE 15 MG immediate release tablet Commonly known as: ROXICODONE Take 1 tablet (15 mg total) by  mouth every 4 (four) hours as needed for moderate pain or severe pain. What changed:  medication strength how much to take reasons to take this   pantoprazole 40 MG tablet Commonly known as: Protonix Take 1 tablet (40 mg total) by mouth daily before breakfast.   promethazine 12.5 MG tablet Commonly known as: PHENERGAN   spironolactone 25 MG tablet Commonly known as: ALDACTONE Take 1 tablet (25 mg total) by mouth daily.        Allergies  Allergen Reactions   Alfuzosin Other (See Comments)    Dizziness    Amlodipine Nausea And Vomiting   Bee Venom Swelling   Cymbalta [Duloxetine Hcl] Other (See Comments)    Emesis    Duloxetine Other (See Comments)    Emesis   Lisinopril    Flomax [Tamsulosin Hcl] Itching    Consultations: Podiatry   Procedures/Studies: US Venous Img Lower Unilateral Right  Result Date: 11/15/2022 CLINICAL DATA:  Edema and swelling EXAM: RIGHT LOWER EXTREMITY  VENOUS DOPPLER ULTRASOUND TECHNIQUE: Gray-scale sonography with graded compression, as well as color Doppler and duplex ultrasound were performed to evaluate the lower extremity deep venous systems from the level of the common femoral vein and including the common femoral, femoral, profunda femoral, popliteal and calf veins including the posterior tibial, peroneal and gastrocnemius veins when visible. Spectral Doppler was utilized to evaluate flow at rest and with distal augmentation maneuvers in the common femoral, femoral and popliteal veins. COMPARISON:  None Available. FINDINGS: Contralateral Common Femoral Vein: Respiratory phasicity is normal and symmetric with the symptomatic side. No evidence of thrombus. Normal compressibility. Common Femoral Vein: No evidence of thrombus. Normal compressibility, respiratory phasicity and response to augmentation. Saphenofemoral Junction: No evidence of thrombus. Normal compressibility and flow on color Doppler imaging. Profunda Femoral Vein: No evidence of  thrombus. Normal compressibility and flow on color Doppler imaging. Femoral Vein: No evidence of thrombus. Normal compressibility, respiratory phasicity and response to augmentation. Popliteal Vein: No evidence of thrombus. Normal compressibility, respiratory phasicity and response to augmentation. Calf Veins: No evidence of thrombus. Normal compressibility and flow on color Doppler imaging. IMPRESSION: No evidence of deep venous thrombosis. Electronically Signed   By: Judie Petit.  Shick M.D.   On: 11/15/2022 18:39   CT Foot Right Wo Contrast  Result Date: 11/15/2022 CLINICAL DATA:  Foot swelling, diabetic, osteomyelitis suspected. Patient had right ankle fusion a proximally 3 weeks ago. EXAM: CT OF THE RIGHT ANKLE WITHOUT CONTRAST CT OF THE RIGHT FOOT WITHOUT CONTRAST TECHNIQUE: Multidetector CT imaging of the right ANKLE AND foot was performed according to the standard protocol. Multiplanar CT image reconstructions were also generated. RADIATION DOSE REDUCTION: This exam was performed according to the departmental dose-optimization program which includes automated exposure control, adjustment of the mA and/or kV according to patient size and/or use of iterative reconstruction technique. COMPARISON:  None Available. FINDINGS: Right ankle/foot: Bones/Joint/Cartilage There is marked periosteal reaction about the distal tibia and fibula and multiple anteroposterior screws across the distal tibia. There is ghosting artifact from the prior hardware in the posterior to mid talus body and calcaneus. Osseous fragmentation of the talus is noted posterior to the ghost track for prior hardware. There are 2 anteroposterior screws for arthrodesis of the talonavicular joint. There is however loosening about the screws in the talus as well as in the calcaneus suspicious for infectious/inflammatory process. There are degenerative changes of the talonavicular joint. Calcaneocuboid joint and tarsometatarsal joints are intact.  Degenerative changes of the first metatarsophalangeal joint. Remaining distal foot joints appear intact. Ligaments Suboptimally assessed by CT. Muscles and Tendons Generalized muscle atrophy of the distal leg muscles. Achilles tendon is intact. Tendons of the flexor, extensor and peroneal compartments also appear maintained. Soft tissues There is marked skin thickening and subcutaneous soft tissue edema about the distal leg as well as along the ankle/foot. No fluid collection or abscess on this noncontrast enhanced examination. IMPRESSION: 1. Marked periosteal reaction about the distal tibia and fibula and multiple anteroposterior screws across the distal tibia, likely sequela of chronic infectious/inflammatory process. 2. There are 2 anteroposterior screws for arthrodesis of the talonavicular joint. There is however loosening about the screws in the talus as well as in the calcaneus suspicious for infectious/inflammatory process. 3. Osseous fragmentation of the talus posterior to the ghost track for prior hardware. 4. Marked skin thickening and subcutaneous soft tissue edema about the distal leg as well as along the ankle/foot. No fluid collection or abscess on this noncontrast enhanced examination. 5. Generalized muscle atrophy  of the distal leg muscles. 6. Degenerative changes of the first metatarsophalangeal joint. Electronically Signed   By: Larose Hires D.O.   On: 11/15/2022 17:03   CT Ankle Right Wo Contrast  Result Date: 11/15/2022 CLINICAL DATA:  Foot swelling, diabetic, osteomyelitis suspected. Patient had right ankle fusion a proximally 3 weeks ago. EXAM: CT OF THE RIGHT ANKLE WITHOUT CONTRAST CT OF THE RIGHT FOOT WITHOUT CONTRAST TECHNIQUE: Multidetector CT imaging of the right ANKLE AND foot was performed according to the standard protocol. Multiplanar CT image reconstructions were also generated. RADIATION DOSE REDUCTION: This exam was performed according to the departmental dose-optimization  program which includes automated exposure control, adjustment of the mA and/or kV according to patient size and/or use of iterative reconstruction technique. COMPARISON:  None Available. FINDINGS: Right ankle/foot: Bones/Joint/Cartilage There is marked periosteal reaction about the distal tibia and fibula and multiple anteroposterior screws across the distal tibia. There is ghosting artifact from the prior hardware in the posterior to mid talus body and calcaneus. Osseous fragmentation of the talus is noted posterior to the ghost track for prior hardware. There are 2 anteroposterior screws for arthrodesis of the talonavicular joint. There is however loosening about the screws in the talus as well as in the calcaneus suspicious for infectious/inflammatory process. There are degenerative changes of the talonavicular joint. Calcaneocuboid joint and tarsometatarsal joints are intact. Degenerative changes of the first metatarsophalangeal joint. Remaining distal foot joints appear intact. Ligaments Suboptimally assessed by CT. Muscles and Tendons Generalized muscle atrophy of the distal leg muscles. Achilles tendon is intact. Tendons of the flexor, extensor and peroneal compartments also appear maintained. Soft tissues There is marked skin thickening and subcutaneous soft tissue edema about the distal leg as well as along the ankle/foot. No fluid collection or abscess on this noncontrast enhanced examination. IMPRESSION: 1. Marked periosteal reaction about the distal tibia and fibula and multiple anteroposterior screws across the distal tibia, likely sequela of chronic infectious/inflammatory process. 2. There are 2 anteroposterior screws for arthrodesis of the talonavicular joint. There is however loosening about the screws in the talus as well as in the calcaneus suspicious for infectious/inflammatory process. 3. Osseous fragmentation of the talus posterior to the ghost track for prior hardware. 4. Marked skin  thickening and subcutaneous soft tissue edema about the distal leg as well as along the ankle/foot. No fluid collection or abscess on this noncontrast enhanced examination. 5. Generalized muscle atrophy of the distal leg muscles. 6. Degenerative changes of the first metatarsophalangeal joint. Electronically Signed   By: Larose Hires D.O.   On: 11/15/2022 17:03      Subjective: Seen and examined on the day of discharge.  Stable no distress.  Pain well-controlled.  Stable for discharge home.  Discharge Exam: Vitals:   11/16/22 0410 11/16/22 0909  BP: 127/88 (!) 143/77  Pulse: 88 88  Resp: 18 18  Temp: 98.8 F (37.1 C) 97.9 F (36.6 C)  SpO2: 100% 95%   Vitals:   11/15/22 2336 11/16/22 0007 11/16/22 0410 11/16/22 0909  BP: 126/82  127/88 (!) 143/77  Pulse: 81  88 88  Resp: 18  18 18   Temp:  99 F (37.2 C) 98.8 F (37.1 C) 97.9 F (36.6 C)  TempSrc:  Oral    SpO2: 99%  100% 95%  Weight:      Height:        General: Pt is alert, awake, not in acute distress Cardiovascular: RRR, S1/S2 +, no rubs, no gallops Respiratory: CTA bilaterally,  no wheezing, no rhonchi Abdominal: Soft, NT, ND, bowel sounds + Extremities: no edema, no cyanosis    The results of significant diagnostics from this hospitalization (including imaging, microbiology, ancillary and laboratory) are listed below for reference.     Microbiology: Recent Results (from the past 240 hour(s))  Blood Culture (routine x 2)     Status: None (Preliminary result)   Collection Time: 11/15/22  4:15 PM   Specimen: BLOOD  Result Value Ref Range Status   Specimen Description BLOOD BLOOD RIGHT WRIST  Final   Special Requests   Final    BOTTLES DRAWN AEROBIC AND ANAEROBIC Blood Culture results may not be optimal due to an inadequate volume of blood received in culture bottles   Culture   Final    NO GROWTH < 12 HOURS Performed at [facility], [address] [place], [place] [address]    Report Status PENDING   Incomplete  Blood Culture (routine x 2)     Status: None (Preliminary result)   Collection Time: 11/15/22  4:20 PM   Specimen: BLOOD  Result Value Ref Range Status   Specimen Description BLOOD LEFT ANTECUBITAL  Final   Special Requests   Final    BOTTLES DRAWN AEROBIC AND ANAEROBIC Blood Culture results may not be optimal due to an excessive volume of blood received in culture bottles   Culture   Final    NO GROWTH < 12 HOURS Performed at [facility], [address] [place], [place] [address]    Report Status PENDING  Incomplete     Labs: BNP (last 3 results) No results for input(s): "BNP" in the last 8760 hours. Basic Metabolic Panel: Recent Labs  Lab 11/15/22 1432 11/15/22 2216 11/16/22 0834  NA 136  --  136  K 4.2  --  3.5  CL 104  --  106  CO2 25  --  22  GLUCOSE 148*  --  99  BUN 18  --  17  CREATININE 1.74* 1.54* 1.63*  CALCIUM 8.5*  --  8.5*   Liver Function Tests: Recent Labs  Lab 11/15/22 1432  AST 35  ALT 12  ALKPHOS 74  BILITOT 1.7*  PROT 6.0*  ALBUMIN 2.7*   No results for input(s): "LIPASE", "AMYLASE" in the last 168 hours. No results for input(s): "AMMONIA" in the last 168 hours. CBC: Recent Labs  Lab 11/15/22 1432 11/15/22 2216 11/16/22 0834  WBC 6.7 7.2 7.7  NEUTROABS 3.7  --  3.6  HGB 11.0* 10.5* 11.3*  HCT 32.9* 32.2* 33.9*  MCV 86.8 87.0 86.0  PLT 164 160 186   Cardiac Enzymes: No results for input(s): "CKTOTAL", "CKMB", "CKMBINDEX", "TROPONINI" in the last 168 hours. BNP: Invalid input(s): "POCBNP" CBG: No results for input(s): "GLUCAP" in the last 168 hours. D-Dimer No results for input(s): "DDIMER" in the last 72 hours. Hgb A1c No results for input(s): "HGBA1C" in the last 72 hours. Lipid Profile No results for input(s): "CHOL", "HDL", "LDLCALC", "TRIG", "CHOLHDL", "LDLDIRECT" in the last 72 hours. Thyroid function studies No results for input(s): "TSH", "T4TOTAL", "T3FREE", "THYROIDAB" in the last 72  hours.  Invalid input(s): "FREET3" Anemia work up No results for input(s): "VITAMINB12", "FOLATE", "FERRITIN", "TIBC", "IRON", "RETICCTPCT" in the last 72 hours. Urinalysis    Component Value Date/Time   COLORURINE YELLOW (A) 11/15/2022 1810   APPEARANCEUR HAZY (A) 11/15/2022 1810   LABSPEC 1.020 11/15/2022 1810   PHURINE 6.0 11/15/2022 1810   GLUCOSEU NEGATIVE 11/15/2022 1810  HGBUR LARGE (A) 11/15/2022 1810   BILIRUBINUR NEGATIVE 11/15/2022 1810   BILIRUBINUR Negative 08/24/2021 1516   KETONESUR NEGATIVE 11/15/2022 1810   PROTEINUR 30 (A) 11/15/2022 1810   UROBILINOGEN 0.2 08/24/2021 1516   NITRITE NEGATIVE 11/15/2022 1810   LEUKOCYTESUR NEGATIVE 11/15/2022 1810   Sepsis Labs Recent Labs  Lab 11/15/22 1432 11/15/22 2216 11/16/22 0834  WBC 6.7 7.2 7.7   Microbiology Recent Results (from the past 240 hour(s))  Blood Culture (routine x 2)     Status: None (Preliminary result)   Collection Time: 11/15/22  4:15 PM   Specimen: BLOOD  Result Value Ref Range Status   Specimen Description BLOOD BLOOD RIGHT WRIST  Final   Special Requests   Final    BOTTLES DRAWN AEROBIC AND ANAEROBIC Blood Culture results may not be optimal due to an inadequate volume of blood received in culture bottles   Culture   Final    NO GROWTH < 12 HOURS Performed at [facility], [address] [place], [place] [address]    Report Status PENDING  Incomplete  Blood Culture (routine x 2)     Status: None (Preliminary result)   Collection Time: 11/15/22  4:20 PM   Specimen: BLOOD  Result Value Ref Range Status   Specimen Description BLOOD LEFT ANTECUBITAL  Final   Special Requests   Final    BOTTLES DRAWN AEROBIC AND ANAEROBIC Blood Culture results may not be optimal due to an excessive volume of blood received in culture bottles   Culture   Final    NO GROWTH < 12 HOURS Performed at [facility], [address], [place], [place] [address]    Report Status PENDING   Incomplete     Time coordinating discharge: Over 30 minutes  SIGNED:   [name], MD  [facility] 11/16/2022, 12:32 PM Pager   If 7PM-7AM, please contact night-coverage

## 2022-11-16 NOTE — Progress Notes (Signed)
OT Cancellation Note  Patient Details Name: Tom [name] MRN: [id number] DOB: 12-Nov-1965   Cancelled Treatment:    Reason Eval/Treat Not Completed: Other (comment). Order received, pt pending podiatry consult. Will initiate services as pt appropriate.    Kathie Dike, M.S. OTR/L  11/16/22, 8:16 AM  ascom [phone]

## 2022-11-16 NOTE — Consult Note (Signed)
Reason for Consult: Possible infection Referring Physician: Dr Ignatius Specking is an 57 y.o. male.  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  His surgeries have been performed by Dr. Deborah Chalk at [facility] orthopedics.  He just spoke with him on the phone and has outpatient follow-up scheduled with him on Monday.  York Spaniel he came to the ER because of his ongoing pain.  Also undergoing treatment for liver cirrhosis  Past Medical History:  Diagnosis Date   Arthritis    COPD (chronic obstructive pulmonary disease) (HCC)    Hyperlipidemia    Hypertension    Osteoporosis     Past Surgical History:  Procedure Laterality Date   ANKLE FUSION Right 2017   BACK SURGERY     TOTAL KNEE ARTHROPLASTY Right 04/2012    History reviewed. No pertinent family history.  Social History:  reports that he quit smoking about 6 years ago. His smoking use included cigarettes. He has a 39.00 pack-year smoking history. He has never used smokeless tobacco. He reports that he does not drink alcohol. No history on file for drug use.  Allergies:  Allergies  Allergen Reactions   Alfuzosin Other (See Comments)    Dizziness    Amlodipine Nausea And Vomiting   Bee Venom Swelling   Cymbalta [Duloxetine Hcl] Other (See Comments)    Emesis    Duloxetine Other (See Comments)    Emesis   Lisinopril    Flomax [Tamsulosin Hcl] Itching    Medications: I have reviewed the patient's current medications.  Results for orders placed or performed during the hospital encounter of 11/15/22 (from the past 48 hour(s))  Lactic acid, plasma     Status: Abnormal   Collection Time: 11/15/22  2:32 PM   Result Value Ref Range   Lactic Acid, Venous 2.7 (HH) 0.5 - 1.9 mmol/L    Comment: CRITICAL RESULT CALLED TO, READ BACK BY AND VERIFIED WITH JACQUE FERGUSON 1458 11/15/22 MU Performed at [facility], [address] [place], [place] [address]   Comprehensive metabolic panel     Status: Abnormal   Collection Time: 11/15/22  2:32 PM  Result Value Ref Range   Sodium 136 135 - 145 mmol/L   Potassium 4.2 3.5 - 5.1 mmol/L   Chloride 104 98 - 111 mmol/L   CO2 25 22 - 32 mmol/L   Glucose, Bld 148 (H) 70 - 99 mg/dL    Comment: Glucose reference range applies only to samples taken after fasting for at least 8 hours.   BUN 18 6 - 20 mg/dL   Creatinine, Ser 6.04 (H) 0.61 - 1.24 mg/dL   Calcium 8.5 (L) 8.9 - 10.3 mg/dL   Total Protein 6.0 (L) 6.5 - 8.1 g/dL   Albumin 2.7 (L) 3.5 - 5.0 g/dL   AST 35 15 - 41 U/L   ALT 12 0 - 44 U/L   Alkaline Phosphatase 74 38 - 126 U/L   Total Bilirubin 1.7 (H) 0.3 - 1.2 mg/dL   GFR, Estimated 45 (L) >60 mL/min    Comment: (NOTE) Calculated using the CKD-EPI Creatinine Equation (2021)    Anion gap 7 5 - 15    Comment: Performed  at [facility], [address] [place], [place] [address]  CBC with Differential     Status: Abnormal   Collection Time: 11/15/22  2:32 PM  Result Value Ref Range   WBC 6.7 4.0 - 10.5 K/uL   RBC 3.79 (L) 4.22 - 5.81 MIL/uL   Hemoglobin 11.0 (L) 13.0 - 17.0 g/dL   HCT 32.4 (L) 40.1 - 02.7 %   MCV 86.8 80.0 - 100.0 fL   MCH 29.0 26.0 - 34.0 pg   MCHC 33.4 30.0 - 36.0 g/dL   RDW 25.3 66.4 - 40.3 %   Platelets 164 150 - 400 K/uL   nRBC 0.0 0.0 - 0.2 %   Neutrophils Relative % 56 %   Neutro Abs 3.7 1.7 - 7.7 K/uL   Lymphocytes Relative 26 %   Lymphs Abs 1.7 0.7 - 4.0 K/uL   Monocytes Relative 12 %   Monocytes Absolute 0.8 0.1 - 1.0 K/uL   Eosinophils Relative 5 %   Eosinophils Absolute 0.3 0.0 - 0.5 K/uL   Basophils Relative 1 %   Basophils Absolute 0.1 0.0 - 0.1 K/uL   Immature Granulocytes 0 %   Abs  Immature Granulocytes 0.01 0.00 - 0.07 K/uL    Comment: Performed at [facility], [address] [place], [place] [address]  Sedimentation rate     Status: Abnormal   Collection Time: 11/15/22  3:50 PM  Result Value Ref Range   Sed Rate 27 (H) 0 - 20 mm/hr    Comment: Performed at [facility], [address] [place], [place] [address]  Blood Culture (routine x 2)     Status: None (Preliminary result)   Collection Time: 11/15/22  4:15 PM   Specimen: BLOOD  Result Value Ref Range   Specimen Description BLOOD BLOOD RIGHT WRIST    Special Requests      BOTTLES DRAWN AEROBIC AND ANAEROBIC Blood Culture results may not be optimal due to an inadequate volume of blood received in culture bottles   Culture      NO GROWTH < 12 HOURS Performed at [facility], [address], [place], [place] [address]    Report Status PENDING   Blood Culture (routine x 2)     Status: None (Preliminary result)   Collection Time: 11/15/22  4:20 PM   Specimen: BLOOD  Result Value Ref Range   Specimen Description BLOOD LEFT ANTECUBITAL    Special Requests      BOTTLES DRAWN AEROBIC AND ANAEROBIC Blood Culture results may not be optimal due to an excessive volume of blood received in culture bottles   Culture      NO GROWTH < 12 HOURS Performed at [facility], [address] [place], [place] [address]    Report Status PENDING   Lactic acid, plasma     Status: Abnormal   Collection Time: 11/15/22  4:21 PM  Result Value Ref Range   Lactic Acid, Venous 2.4 (HH) 0.5 - 1.9 mmol/L    Comment: CRITICAL VALUE NOTED. VALUE IS CONSISTENT WITH PREVIOUSLY REPORTED/CALLED VALUE MU Performed at [facility], [address] [place], [place] [address]   C-reactive protein     Status: Abnormal   Collection Time: 11/15/22  4:27 PM  Result Value Ref Range   CRP 1.0 (H) <1.0 mg/dL    Comment: Performed at [facility], [address] [place], [place] [address]   Urinalysis, Complete w Microscopic -Urine, Clean Catch  Status: Abnormal   Collection Time: 11/15/22  6:10 PM  Result Value Ref Range   Color, Urine YELLOW (A) YELLOW   APPearance HAZY (A) CLEAR   Specific Gravity, Urine 1.020 1.005 - 1.030   pH 6.0 5.0 - 8.0   Glucose, UA NEGATIVE NEGATIVE mg/dL   Hgb urine dipstick LARGE (A) NEGATIVE   Bilirubin Urine NEGATIVE NEGATIVE   Ketones, ur NEGATIVE NEGATIVE mg/dL   Protein, ur 30 (A) NEGATIVE mg/dL   Nitrite NEGATIVE NEGATIVE   Leukocytes,Ua NEGATIVE NEGATIVE   RBC / HPF >50 0 - 5 RBC/hpf   WBC, UA 6-10 0 - 5 WBC/hpf   Bacteria, UA RARE (A) NONE SEEN   Squamous Epithelial / HPF 0-5 0 - 5 /HPF   Mucus PRESENT     Comment: Performed at [facility], [address] [place], [place] [address]  CBC     Status: Abnormal   Collection Time: 11/15/22 10:16 PM  Result Value Ref Range   WBC 7.2 4.0 - 10.5 K/uL   RBC 3.70 (L) 4.22 - 5.81 MIL/uL   Hemoglobin 10.5 (L) 13.0 - 17.0 g/dL   HCT 19.1 (L) 47.8 - 29.5 %   MCV 87.0 80.0 - 100.0 fL   MCH 28.4 26.0 - 34.0 pg   MCHC 32.6 30.0 - 36.0 g/dL   RDW 62.1 30.8 - 65.7 %   Platelets 160 150 - 400 K/uL   nRBC 0.0 0.0 - 0.2 %    Comment: Performed at [facility], [address] [place], [place] [address]  Creatinine, serum     Status: Abnormal   Collection Time: 11/15/22 10:16 PM  Result Value Ref Range   Creatinine, Ser 1.54 (H) 0.61 - 1.24 mg/dL   GFR, Estimated 52 (L) >60 mL/min    Comment: (NOTE) Calculated using the CKD-EPI Creatinine Equation (2021) Performed at [facility], [address] [place], [place] [address]   HIV Antibody (routine testing w rflx)     Status: None   Collection Time: 11/16/22  5:03 AM  Result Value Ref Range   HIV Screen 4th Generation wRfx Non Reactive Non Reactive    Comment: Performed at [facility], [address] [place], [place] [address]  CBC with Differential/Platelet     Status: Abnormal   Collection  Time: 11/16/22  8:34 AM  Result Value Ref Range   WBC 7.7 4.0 - 10.5 K/uL   RBC 3.94 (L) 4.22 - 5.81 MIL/uL   Hemoglobin 11.3 (L) 13.0 - 17.0 g/dL   HCT 40.1 (L) 02.7 - 25.3 %   MCV 86.0 80.0 - 100.0 fL   MCH 28.7 26.0 - 34.0 pg   MCHC 33.3 30.0 - 36.0 g/dL   RDW 66.4 40.3 - 47.4 %   Platelets 186 150 - 400 K/uL   nRBC 0.0 0.0 - 0.2 %   Neutrophils Relative % 47 %   Neutro Abs 3.6 1.7 - 7.7 K/uL   Lymphocytes Relative 31 %   Lymphs Abs 2.4 0.7 - 4.0 K/uL   Monocytes Relative 13 %   Monocytes Absolute 1.0 0.1 - 1.0 K/uL   Eosinophils Relative 7 %   Eosinophils Absolute 0.6 (H) 0.0 - 0.5 K/uL   Basophils Relative 2 %   Basophils Absolute 0.1 0.0 - 0.1 K/uL   Immature Granulocytes 0 %   Abs Immature Granulocytes 0.02 0.00 - 0.07 K/uL    Comment: Performed at [facility], [address] [place],  [place] [address]  Basic metabolic panel     Status: Abnormal   Collection Time: 11/16/22  8:34 AM  Result Value Ref Range   Sodium 136 135 - 145 mmol/L   Potassium 3.5 3.5 - 5.1 mmol/L   Chloride 106 98 - 111 mmol/L   CO2 22 22 - 32 mmol/L   Glucose, Bld 99 70 - 99 mg/dL    Comment: Glucose reference range applies only to samples taken after fasting for at least 8 hours.   BUN 17 6 - 20 mg/dL   Creatinine, Ser 5.40 (H) 0.61 - 1.24 mg/dL   Calcium 8.5 (L) 8.9 - 10.3 mg/dL   GFR, Estimated 49 (L) >60 mL/min    Comment: (NOTE) Calculated using the CKD-EPI Creatinine Equation (2021)    Anion gap 8 5 - 15    Comment: Performed at [facility], [address] [name], [place] [address]    US Venous Img Lower Unilateral Right  Result Date: 11/15/2022 CLINICAL DATA:  Edema and swelling EXAM: RIGHT LOWER EXTREMITY VENOUS DOPPLER ULTRASOUND TECHNIQUE: Gray-scale sonography with graded compression, as well as color Doppler and duplex ultrasound were performed to evaluate the lower extremity deep venous systems from the level of the common femoral vein and including the  common femoral, femoral, profunda femoral, popliteal and calf veins including the posterior tibial, peroneal and gastrocnemius veins when visible. Spectral Doppler was utilized to evaluate flow at rest and with distal augmentation maneuvers in the common femoral, femoral and popliteal veins. COMPARISON:  None Available. FINDINGS: Contralateral Common Femoral Vein: Respiratory phasicity is normal and symmetric with the symptomatic side. No evidence of thrombus. Normal compressibility. Common Femoral Vein: No evidence of thrombus. Normal compressibility, respiratory phasicity and response to augmentation. Saphenofemoral Junction: No evidence of thrombus. Normal compressibility and flow on color Doppler imaging. Profunda Femoral Vein: No evidence of thrombus. Normal compressibility and flow on color Doppler imaging. Femoral Vein: No evidence of thrombus. Normal compressibility, respiratory phasicity and response to augmentation. Popliteal Vein: No evidence of thrombus. Normal compressibility, respiratory phasicity and response to augmentation. Calf Veins: No evidence of thrombus. Normal compressibility and flow on color Doppler imaging. IMPRESSION: No evidence of deep venous thrombosis. Electronically Signed   By: Judie Petit.  Shick M.D.   On: 11/15/2022 18:39   CT Foot Right Wo Contrast  Result Date: 11/15/2022 CLINICAL DATA:  Foot swelling, diabetic, osteomyelitis suspected. Patient had right ankle fusion a proximally 3 weeks ago. EXAM: CT OF THE RIGHT ANKLE WITHOUT CONTRAST CT OF THE RIGHT FOOT WITHOUT CONTRAST TECHNIQUE: Multidetector CT imaging of the right ANKLE AND foot was performed according to the standard protocol. Multiplanar CT image reconstructions were also generated. RADIATION DOSE REDUCTION: This exam was performed according to the departmental dose-optimization program which includes automated exposure control, adjustment of the mA and/or kV according to patient size and/or use of iterative reconstruction  technique. COMPARISON:  None Available. FINDINGS: Right ankle/foot: Bones/Joint/Cartilage There is marked periosteal reaction about the distal tibia and fibula and multiple anteroposterior screws across the distal tibia. There is ghosting artifact from the prior hardware in the posterior to mid talus body and calcaneus. Osseous fragmentation of the talus is noted posterior to the ghost track for prior hardware. There are 2 anteroposterior screws for arthrodesis of the talonavicular joint. There is however loosening about the screws in the talus as well as in the calcaneus suspicious for infectious/inflammatory process. There are degenerative changes of the talonavicular joint. Calcaneocuboid joint and tarsometatarsal joints are intact.  Degenerative changes of the first metatarsophalangeal joint. Remaining distal foot joints appear intact. Ligaments Suboptimally assessed by CT. Muscles and Tendons Generalized muscle atrophy of the distal leg muscles. Achilles tendon is intact. Tendons of the flexor, extensor and peroneal compartments also appear maintained. Soft tissues There is marked skin thickening and subcutaneous soft tissue edema about the distal leg as well as along the ankle/foot. No fluid collection or abscess on this noncontrast enhanced examination. IMPRESSION: 1. Marked periosteal reaction about the distal tibia and fibula and multiple anteroposterior screws across the distal tibia, likely sequela of chronic infectious/inflammatory process. 2. There are 2 anteroposterior screws for arthrodesis of the talonavicular joint. There is however loosening about the screws in the talus as well as in the calcaneus suspicious for infectious/inflammatory process. 3. Osseous fragmentation of the talus posterior to the ghost track for prior hardware. 4. Marked skin thickening and subcutaneous soft tissue edema about the distal leg as well as along the ankle/foot. No fluid collection or abscess on this noncontrast  enhanced examination. 5. Generalized muscle atrophy of the distal leg muscles. 6. Degenerative changes of the first metatarsophalangeal joint. Electronically Signed   By: Larose Hires D.O.   On: 11/15/2022 17:03   CT Ankle Right Wo Contrast  Result Date: 11/15/2022 CLINICAL DATA:  Foot swelling, diabetic, osteomyelitis suspected. Patient had right ankle fusion a proximally 3 weeks ago. EXAM: CT OF THE RIGHT ANKLE WITHOUT CONTRAST CT OF THE RIGHT FOOT WITHOUT CONTRAST TECHNIQUE: Multidetector CT imaging of the right ANKLE AND foot was performed according to the standard protocol. Multiplanar CT image reconstructions were also generated. RADIATION DOSE REDUCTION: This exam was performed according to the departmental dose-optimization program which includes automated exposure control, adjustment of the mA and/or kV according to patient size and/or use of iterative reconstruction technique. COMPARISON:  None Available. FINDINGS: Right ankle/foot: Bones/Joint/Cartilage There is marked periosteal reaction about the distal tibia and fibula and multiple anteroposterior screws across the distal tibia. There is ghosting artifact from the prior hardware in the posterior to mid talus body and calcaneus. Osseous fragmentation of the talus is noted posterior to the ghost track for prior hardware. There are 2 anteroposterior screws for arthrodesis of the talonavicular joint. There is however loosening about the screws in the talus as well as in the calcaneus suspicious for infectious/inflammatory process. There are degenerative changes of the talonavicular joint. Calcaneocuboid joint and tarsometatarsal joints are intact. Degenerative changes of the first metatarsophalangeal joint. Remaining distal foot joints appear intact. Ligaments Suboptimally assessed by CT. Muscles and Tendons Generalized muscle atrophy of the distal leg muscles. Achilles tendon is intact. Tendons of the flexor, extensor and peroneal compartments also  appear maintained. Soft tissues There is marked skin thickening and subcutaneous soft tissue edema about the distal leg as well as along the ankle/foot. No fluid collection or abscess on this noncontrast enhanced examination. IMPRESSION: 1. Marked periosteal reaction about the distal tibia and fibula and multiple anteroposterior screws across the distal tibia, likely sequela of chronic infectious/inflammatory process. 2. There are 2 anteroposterior screws for arthrodesis of the talonavicular joint. There is however loosening about the screws in the talus as well as in the calcaneus suspicious for infectious/inflammatory process. 3. Osseous fragmentation of the talus posterior to the ghost track for prior hardware. 4. Marked skin thickening and subcutaneous soft tissue edema about the distal leg as well as along the ankle/foot. No fluid collection or abscess on this noncontrast enhanced examination. 5. Generalized muscle atrophy of the distal leg  muscles. 6. Degenerative changes of the first metatarsophalangeal joint. Electronically Signed   By: Larose Hires D.O.   On: 11/15/2022 17:03    Review of Systems  Constitutional:  Negative for chills and fever.  Respiratory:  Negative for shortness of breath.   Cardiovascular:  Negative for chest pain.  Gastrointestinal:  Negative for nausea and vomiting.  Musculoskeletal:  Positive for joint pain (Right foot and ankle).  All other systems reviewed and are negative.  Blood pressure (!) 143/77, pulse 88, temperature 97.9 F (36.6 C), resp. rate 18, height 6' (1.829 m), weight 103 kg, SpO2 95 %.  Vitals:   11/16/22 0410 11/16/22 0909  BP: 127/88 (!) 143/77  Pulse: 88 88  Resp: 18 18  Temp: 98.8 F (37.1 C) 97.9 F (36.6 C)  SpO2: 100% 95%    General AA&O x3. Normal mood and affect.  Vascular Pulses are palpable, limb is warm and well-perfused, moderate to severe edema right lower extremity  Neurologic Epicritic sensation grossly present.   Dermatologic (Wound) Small stab incision plantar heel with no cellulitis, mild serous drainage  Orthopedic: Motor intact BLE.    Assessment/Plan:  Chronic right lower extremity pain status post TTC fusion -Imaging: Studies independently reviewed.  There is some lucency around the screws but there is no failure of the hardware and the remaining implants are in good position.  I suspect this likely is secondary to nonunion rather than infection, in conjunction with his lab work I have low suspicion of infection his sed rate has only mild elevation and his CRP is normal.  His lactate was elevated on admission but as discussed with internal medicine, this is likely secondary to his cirrhosis.  He is on chronic clindamycin and has established outpatient follow-up with his orthopedic surgeon next week.  He is stable for discharge from our standpoint.  I left a voicemail with his orthopedic surgeon to discuss his case but was not able to reach him.  The patient has already discussed his weightbearing restrictions with his surgeon and will adhere to these.  He is welcome to follow-up with our practice as needed as an outpatient   Edwin Cap 11/16/2022, 1:14 PM   Best available via secure chat for questions or concerns.

## 2022-11-16 NOTE — Progress Notes (Signed)
Discharged in stable condition, after receiving d/c instructions.

## 2022-11-16 NOTE — Plan of Care (Signed)
  Problem: Clinical Measurements: Goal: Ability to maintain clinical measurements within normal limits will improve Outcome: Progressing Goal: Will remain free from infection Outcome: Progressing Goal: Diagnostic test results will improve Outcome: Progressing Goal: Respiratory complications will improve Outcome: Progressing Goal: Cardiovascular complication will be avoided Outcome: Progressing   Problem: Nutrition: Goal: Adequate nutrition will be maintained Outcome: Progressing   Problem: Coping: Goal: Level of anxiety will decrease Outcome: Progressing   Problem: Elimination: Goal: Will not experience complications related to bowel motility Outcome: Progressing Goal: Will not experience complications related to urinary retention Outcome: Progressing   Problem: Safety: Goal: Ability to remain free from injury will improve Outcome: Progressing   Problem: Skin Integrity: Goal: Risk for impaired skin integrity will decrease Outcome: Progressing   

## 2022-11-18 ENCOUNTER — Other Ambulatory Visit: Payer: Medicaid Other

## 2022-11-18 DIAGNOSIS — Z515 Encounter for palliative care: Secondary | ICD-10-CM

## 2022-11-18 NOTE — Progress Notes (Signed)
TELEPHONE ENCOUNTER  Incoming call from patient inquiring if PC was going to do a home visit today. Sw advised patient that PC is not scheduled to visit again until 6/24. Patient shared that he did not think he could wait that long and that he has had 4 ED visits since PC previous visits due to foot and abdominal pain. Patient state today his abdomen/stomach is in discomfort and is currently draining fluids. Patient share that he refuses to return back to the ER due to feeling as though he will not receive the care/treatment he needs.   SW connected patient with PC RN, PJ, whom scheduled an in person visit with patient for mon 6/3 @10  am and advised patient to go to the hospital to be treated should his symptoms worsen over the weekend.

## 2022-11-19 LAB — CULTURE, BLOOD (ROUTINE X 2): Culture: NO GROWTH

## 2022-11-20 LAB — CULTURE, BLOOD (ROUTINE X 2)

## 2022-11-21 ENCOUNTER — Other Ambulatory Visit: Payer: Medicaid Other

## 2022-11-21 VITALS — BP 152/80 | HR 94 | Resp 18

## 2022-11-21 DIAGNOSIS — M7989 Other specified soft tissue disorders: Secondary | ICD-10-CM | POA: Diagnosis not present

## 2022-11-21 DIAGNOSIS — R600 Localized edema: Secondary | ICD-10-CM | POA: Diagnosis not present

## 2022-11-21 DIAGNOSIS — Z515 Encounter for palliative care: Secondary | ICD-10-CM

## 2022-11-21 DIAGNOSIS — T8484XD Pain due to internal orthopedic prosthetic devices, implants and grafts, subsequent encounter: Secondary | ICD-10-CM | POA: Diagnosis not present

## 2022-11-21 NOTE — Progress Notes (Signed)
1010- Palliative Care Follow Up Encounter Note   PATIENT NAME: Tom [name] DOB: September 26, 1965 MRN: [id number]  PRIMARY CARE PROVIDER: [name], DO  RESPONSIBLE PARTY:  Acct ID - Guarantor Home Phone Work Phone Relationship Acct Type  [id number] Hurman Horn* [id number]  Self P/F     [address] [place], [place] [address]   RN completed home visit.    HISTORY OF PRESENT ILLNESS:  57 y.o. male with medical history significant for DM, HTN, cirrhosis of the liver, CAD, COPD, with prior arthrodesis of the right ankle complicated by chronic right foot pain.    Pain: Rates pain currently to right foot, 8 on 1-10 scale. "Not as bad as it could be. It doesn't ever go below an 8. I have to have pain meds to just walk outside."   Cirrhosis: Pt denies abdominal pain at present. Abdomen is very large and firm. He says he weighs himself, "when I feel like I can get up on my foot."  Paracentesis: Pt had called this past Friday and reported that he had drainage from his abdomen from last paracentesis. Reported that the fluid was dk yellow and that it took about 8 hours to soak thru a washcloth. RN asked pt if he measures his abdomen. Pt states, "no, I just weigh myself every day." When asked what weight was today, pt states, " oh I haven't weighed myself yet. I didn't fell like getting up on my foot." When asked what weight was yesterday, pt reports that he has not weighed himself this weekend.   RN discussed importance of weights and/or abdominal measurements to track ascites. Pt verbalized understanding.  Pt reports that drainage stopped last evening. Next paracentesis is scheduled for June 24th. Pt says he does not feel bloated.   Foot surgery: Right leg is very edematous 4+ and pitting. Warm and dry. Pt reports that "foot is not swollen when I wake up, but once I am up for a bit, it swells like this." Pt has an appt with foot surgeon this afternoon. Reports that he uses  rollator when walking and "put my knee up on that seat." When asked about bearing weight, he reports that he walks on his foot , but "not much". RN reminded pt of no weight bearing status for right foot. Pt voiced understanding.   Mobility: Pt has rollator beside chair. Pt reports that he uses it "most of the time." RN cautioned pt against ambulating without it.   RN asked about HH PT. Pt is interested if surgeon ok's it. Pt states, " I will ask the surgeon today about it."  Goals of Care: Have less issues with right foot, less pain, and feel like doing more. I just want to walk around and spend time with my grandbabies.  Pt requesting a call from [place] to help him apply for medicare. RN to notify SW.  CODE STATUS: Full ADVANCED DIRECTIVES: Yes MOST FORM: No PPS: 50%  Next appt scheduled: Raynelle Fanning and [place] to reach out to schedule next home visit with pt.   PHYSICAL EXAM:   VITALS: Today's Vitals   11/21/22 1028  BP: (!) 152/80  Pulse: 94  Resp: 18  SpO2: 96%  PainSc: 8   PainLoc: Foot    LUNGS:  diminished in bases CARDIAC:  regular, no JVD EXTREMITIES: MAE x 4, 4+edema noted to right foot Abdomen: Swollen, firm, bowel sounds present. SKIN: bruises noted to arms. Skin is dusky yellow in color   Barbette Merino,  RN

## 2022-11-23 ENCOUNTER — Encounter: Payer: Self-pay | Admitting: Family Medicine

## 2022-11-23 ENCOUNTER — Ambulatory Visit: Payer: Medicaid Other | Admitting: Family Medicine

## 2022-11-23 VITALS — BP 164/88 | HR 104 | Temp 96.8°F | Wt 253.0 lb

## 2022-11-23 DIAGNOSIS — T8484XA Pain due to internal orthopedic prosthetic devices, implants and grafts, initial encounter: Secondary | ICD-10-CM

## 2022-11-23 DIAGNOSIS — Z515 Encounter for palliative care: Secondary | ICD-10-CM | POA: Diagnosis not present

## 2022-11-23 DIAGNOSIS — G8929 Other chronic pain: Secondary | ICD-10-CM | POA: Diagnosis not present

## 2022-11-23 DIAGNOSIS — M79671 Pain in right foot: Secondary | ICD-10-CM

## 2022-11-23 DIAGNOSIS — K7031 Alcoholic cirrhosis of liver with ascites: Secondary | ICD-10-CM | POA: Diagnosis not present

## 2022-11-23 MED ORDER — OXYCODONE HCL 10 MG PO TABS
10.0000 mg | ORAL_TABLET | Freq: Four times a day (QID) | ORAL | 0 refills | Status: DC | PRN
Start: 2022-11-23 — End: 2022-12-01

## 2022-11-23 NOTE — Patient Instructions (Addendum)
Thank you for coming to the office today.  [facility] available today for pick up on the Oxycodone.  I would suggest 10mg  dosing approximately 4 times per day if possible at max dose. I can do a 2 week rx to help manage the pain for now.  I will be in touch with the Palliative Team to see if they can offer assistance on these medications to help me manage them for you.  We may need to get in contact with Pain Management specialist if we are unable to control your pain. That would likely be at a different location.  Keep up with [facility] for now.  Please schedule a Follow-up Appointment to: Return in about 2 weeks (around 12/07/2022) for 2 week follow-up Pain Management, Virtual Video.  If you have any other questions or concerns, please feel free to call the office or send a message through MyChart. You may also schedule an earlier appointment if necessary.  Additionally, you may be receiving a survey about your experience at our office within a few days to 1 week by e-mail or mail. We value your feedback.  Saralyn Pilar, DO [facility], [place]

## 2022-11-23 NOTE — Progress Notes (Unsigned)
Subjective:    Patient ID: Tom [name], male    DOB: Nov 01, 1965, 57 y.o.   MRN: [id number]  Tom [name] is a 57 y.o. 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 They said his bones are not suitable to walk on right now and has significant pain with this, also concern for risk of infection in future. He has [facility] apt tomorrow  Upcoming apts [facility] tomorrow and [employer].  He is needing pain management handled through our office. He is on Oxycodone AS NEEDED taking 5mg  x 7-8 per day, inc amount compared to rx.  He is a patient of palliative care, and has [facility] RN saw him. Has not seen NP recently for symptom management.  Cirrhosis, Ascites / Edema Reviewed recent hospitalization     03/21/2022    2:56 PM 12/16/2020    3:16 PM  Depression screen PHQ 2/9  Decreased Interest 0 0  Down, Depressed, Hopeless 0 0  PHQ - 2 Score 0 0  Altered sleeping 0 0  Tired, decreased energy 0 0  Change in appetite 0 0  Feeling bad or failure about yourself  0 0  Trouble concentrating 0 0  Moving slowly or fidgety/restless 0 0  Suicidal thoughts 0 0  PHQ-9 Score 0 0  Difficult doing work/chores Not difficult at all Not difficult at all    Social History   Tobacco Use   Smoking status: Former    Packs/day: 1.00    Years: 39.00    Additional pack years: 0.00    Total pack years: 39.00    Types: Cigarettes    Quit date: 12/2015    Years since quitting: 6.9   Smokeless tobacco: Never  Vaping Use   Vaping Use: Never used  Substance Use Topics   Alcohol use: No    Review of Systems Per HPI unless specifically indicated above     Objective:    BP (!) 164/88 (BP  Location: Right Arm, Patient Position: Sitting, Cuff Size: Normal)   Pulse (!) 104   Temp (!) 96.8 F (36 C) (Temporal)   Wt 253 lb (114.8 kg)   SpO2 99%   BMI 34.31 kg/m   Wt Readings from Last 3 Encounters:  11/23/22 253 lb (114.8 kg)  11/15/22 227 lb 1.2 oz (103 kg)  10/20/22 227 lb (103 kg)    Physical Exam Vitals and nursing note reviewed.  Constitutional:      General: He is not in acute distress.    Appearance: Normal appearance. He is well-developed. He is not diaphoretic.     Comments: Well-appearing, comfortable, cooperative  HENT:     Head: Normocephalic and atraumatic.  Eyes:     General:        Right eye: No discharge.        Left eye: No discharge.     Conjunctiva/sclera: Conjunctivae normal.  Cardiovascular:     Rate and Rhythm: Normal rate.  Pulmonary:     Effort: Pulmonary effort is normal.  Musculoskeletal:     Right lower leg: Edema present.     Left lower leg: Edema present.     Comments: Antalgic gait, has rolling walker  Skin:    General: Skin is warm and dry.     Findings: No erythema or rash.  Neurological:     Mental Status: He is alert and oriented to person, place, and time.  Psychiatric:        Mood and Affect: Mood normal.        Behavior: Behavior normal.        Thought Content: Thought content normal.     Comments: Well groomed, good eye contact, normal speech and thoughts      Results for orders placed or performed during the hospital encounter of 11/15/22  Blood Culture (routine x 2)   Specimen: BLOOD  Result Value Ref Range   Specimen Description BLOOD BLOOD RIGHT WRIST    Special Requests      BOTTLES DRAWN AEROBIC AND ANAEROBIC Blood Culture results may not be optimal due to an inadequate volume of blood received in culture bottles   Culture      NO GROWTH 5 DAYS Performed at [facility], [address] [place], [place] [address]    Report Status 11/20/2022 FINAL   Blood Culture (routine x 2)   Specimen:  BLOOD  Result Value Ref Range   Specimen Description BLOOD LEFT ANTECUBITAL    Special Requests      BOTTLES DRAWN AEROBIC AND ANAEROBIC Blood Culture results may not be optimal due to an excessive volume of blood received in culture bottles   Culture      NO GROWTH 5 DAYS Performed at [facility], [address] [place], [place] [address]    Report Status 11/20/2022 FINAL   Lactic acid, plasma  Result Value Ref Range   Lactic Acid, Venous 2.7 (HH) 0.5 - 1.9 mmol/L  Lactic acid, plasma  Result Value Ref Range   Lactic Acid, Venous 2.4 (HH) 0.5 - 1.9 mmol/L  Comprehensive metabolic panel  Result Value Ref Range   Sodium 136 135 - 145 mmol/L   Potassium 4.2 3.5 - 5.1 mmol/L   Chloride 104 98 - 111 mmol/L   CO2 25 22 - 32 mmol/L   Glucose, Bld 148 (H) 70 - 99 mg/dL   BUN 18 6 - 20 mg/dL   Creatinine, Ser 0.98 (H) 0.61 - 1.24 mg/dL   Calcium 8.5 (L) 8.9 - 10.3 mg/dL   Total Protein 6.0 (L) 6.5 - 8.1 g/dL   Albumin 2.7 (L) 3.5 - 5.0 g/dL   AST 35 15 - 41 U/L   ALT 12 0 - 44 U/L   Alkaline Phosphatase 74 38 - 126 U/L   Total Bilirubin 1.7 (H) 0.3 - 1.2 mg/dL   GFR, Estimated 45 (L) >60 mL/min   Anion gap 7 5 - 15  CBC with Differential  Result Value Ref Range   WBC 6.7 4.0 - 10.5 K/uL   RBC 3.79 (L) 4.22 - 5.81 MIL/uL   Hemoglobin 11.0 (L) 13.0 - 17.0 g/dL   HCT 11.9 (L) 14.7 - 82.9 %   MCV 86.8 80.0 - 100.0 fL   MCH 29.0 26.0 - 34.0 pg   MCHC 33.4 30.0 - 36.0 g/dL   RDW 56.2 13.0 - 86.5 %   Platelets 164 150 - 400 K/uL   nRBC 0.0 0.0 - 0.2 %   Neutrophils Relative % 56 %   Neutro Abs 3.7 1.7 - 7.7 K/uL   Lymphocytes Relative 26 %   Lymphs Abs 1.7 0.7 - 4.0 K/uL   Monocytes Relative 12 %   Monocytes  Absolute 0.8 0.1 - 1.0 K/uL   Eosinophils Relative 5 %   Eosinophils Absolute 0.3 0.0 - 0.5 K/uL   Basophils Relative 1 %   Basophils Absolute 0.1 0.0 - 0.1 K/uL   Immature Granulocytes 0 %   Abs Immature Granulocytes 0.01 0.00 - 0.07 K/uL  Sedimentation  rate  Result Value Ref Range   Sed Rate 27 (H) 0 - 20 mm/hr  Urinalysis, Complete w Microscopic -Urine, Clean Catch  Result Value Ref Range   Color, Urine YELLOW (A) YELLOW   APPearance HAZY (A) CLEAR   Specific Gravity, Urine 1.020 1.005 - 1.030   pH 6.0 5.0 - 8.0   Glucose, UA NEGATIVE NEGATIVE mg/dL   Hgb urine dipstick LARGE (A) NEGATIVE   Bilirubin Urine NEGATIVE NEGATIVE   Ketones, ur NEGATIVE NEGATIVE mg/dL   Protein, ur 30 (A) NEGATIVE mg/dL   Nitrite NEGATIVE NEGATIVE   Leukocytes,Ua NEGATIVE NEGATIVE   RBC / HPF >50 0 - 5 RBC/hpf   WBC, UA 6-10 0 - 5 WBC/hpf   Bacteria, UA RARE (A) NONE SEEN   Squamous Epithelial / HPF 0-5 0 - 5 /HPF   Mucus PRESENT   C-reactive protein  Result Value Ref Range   CRP 1.0 (H) <1.0 mg/dL  CBC  Result Value Ref Range   WBC 7.2 4.0 - 10.5 K/uL   RBC 3.70 (L) 4.22 - 5.81 MIL/uL   Hemoglobin 10.5 (L) 13.0 - 17.0 g/dL   HCT 41.3 (L) 24.4 - 01.0 %   MCV 87.0 80.0 - 100.0 fL   MCH 28.4 26.0 - 34.0 pg   MCHC 32.6 30.0 - 36.0 g/dL   RDW 27.2 53.6 - 64.4 %   Platelets 160 150 - 400 K/uL   nRBC 0.0 0.0 - 0.2 %  Creatinine, serum  Result Value Ref Range   Creatinine, Ser 1.54 (H) 0.61 - 1.24 mg/dL   GFR, Estimated 52 (L) >60 mL/min  HIV Antibody (routine testing w rflx)  Result Value Ref Range   HIV Screen 4th Generation wRfx Non Reactive Non Reactive  CBC with Differential/Platelet  Result Value Ref Range   WBC 7.7 4.0 - 10.5 K/uL   RBC 3.94 (L) 4.22 - 5.81 MIL/uL   Hemoglobin 11.3 (L) 13.0 - 17.0 g/dL   HCT 03.4 (L) 74.2 - 59.5 %   MCV 86.0 80.0 - 100.0 fL   MCH 28.7 26.0 - 34.0 pg   MCHC 33.3 30.0 - 36.0 g/dL   RDW 63.8 75.6 - 43.3 %   Platelets 186 150 - 400 K/uL   nRBC 0.0 0.0 - 0.2 %   Neutrophils Relative % 47 %   Neutro Abs 3.6 1.7 - 7.7 K/uL   Lymphocytes Relative 31 %   Lymphs Abs 2.4 0.7 - 4.0 K/uL   Monocytes Relative 13 %   Monocytes Absolute 1.0 0.1 - 1.0 K/uL   Eosinophils Relative 7 %   Eosinophils Absolute 0.6  (H) 0.0 - 0.5 K/uL   Basophils Relative 2 %   Basophils Absolute 0.1 0.0 - 0.1 K/uL   Immature Granulocytes 0 %   Abs Immature Granulocytes 0.02 0.00 - 0.07 K/uL  Basic metabolic panel  Result Value Ref Range   Sodium 136 135 - 145 mmol/L   Potassium 3.5 3.5 - 5.1 mmol/L   Chloride 106 98 - 111 mmol/L   CO2 22 22 - 32 mmol/L   Glucose, Bld 99 70 - 99 mg/dL   BUN 17 6 - 20 mg/dL  Creatinine, Ser 1.63 (H) 0.61 - 1.24 mg/dL   Calcium 8.5 (L) 8.9 - 10.3 mg/dL   GFR, Estimated 49 (L) >60 mL/min   Anion gap 8 5 - 15      Assessment & Plan:   Problem List Items Addressed This Visit     Alcoholic cirrhosis of liver with ascites (HCC)   Other Visit Diagnoses     Chronic foot pain, right    -  Primary   Relevant Medications   Oxycodone HCl 10 MG TABS   Palliative care patient       Relevant Medications   Oxycodone HCl 10 MG TABS   Painful orthopaedic hardware (HCC)       Relevant Medications   Oxycodone HCl 10 MG TABS      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 He is non weight bearing. But cannot obtain a scooter.  Additionally Cirrhosis and edema is complicating this situation.  He uses rolling walker currently   [facility] today for pick up on the Oxycodone. They were able to cancel the 15mg  order from hospital.  New rx sent Oxycodone IR 10mg  4 per day #60 dispense, anticipate 2 week supply  I advised that I can help manage his pain at this time and I will try to work with Palliative Care for further symptom management advice.  We may need to get in contact with Pain Management specialist if we are unable to control your pain. That would likely be at a different location.  Keep up with [facility] for now for ID + Ortho.  ***Call Palliative  Meds ordered this encounter  Medications   Oxycodone HCl 10 MG TABS     Sig: Take 1 tablet (10 mg total) by mouth 4 (four) times daily as needed (modereate to severe pain).    Dispense:  60 tablet    Refill:  0    Follow up plan: Return in about 2 weeks (around 12/07/2022) for 2 week follow-up Pain Management, Virtual Video.   Saralyn Pilar, DO [facility] 11/23/2022, 1:25 PM

## 2022-12-01 ENCOUNTER — Other Ambulatory Visit: Payer: Self-pay | Admitting: Family Medicine

## 2022-12-01 DIAGNOSIS — Z515 Encounter for palliative care: Secondary | ICD-10-CM

## 2022-12-01 DIAGNOSIS — T8484XA Pain due to internal orthopedic prosthetic devices, implants and grafts, initial encounter: Secondary | ICD-10-CM

## 2022-12-01 DIAGNOSIS — G8929 Other chronic pain: Secondary | ICD-10-CM

## 2022-12-01 NOTE — Telephone Encounter (Signed)
Medication Refill - Medication: Oxycodone HCl 10 MG TABS  Pt states that he will run out of medication on Saturday and is wanting to see if he is able to get his medication before it runs out.   Has the patient contacted their pharmacy? No.   Preferred Pharmacy (with phone number or street name): [id number] [place] [place] [facility] [address]  Phone: [phone] Fax: [phone]  Has the patient been seen for an appointment in the last year OR does the patient have an upcoming appointment? Yes.    Agent: Please be advised that RX refills may take up to 3 business days. We ask that you follow-up with your pharmacy.

## 2022-12-02 ENCOUNTER — Telehealth: Payer: Self-pay

## 2022-12-02 MED ORDER — OXYCODONE HCL 10 MG PO TABS
10.0000 mg | ORAL_TABLET | Freq: Four times a day (QID) | ORAL | 0 refills | Status: DC | PRN
Start: 2022-12-02 — End: 2022-12-12

## 2022-12-02 NOTE — Telephone Encounter (Signed)
Rx Oxycodone sent to pharmacy.  Saralyn Pilar, DO [facility] 12/02/2022, 4:50 PM

## 2022-12-02 NOTE — Telephone Encounter (Signed)
Copied from CRM [id number]. Topic: General - Other >> Dec 02, 2022  9:39 AM Franchot Heidelberg wrote: Reason for CRM: Pt called check to status of refill request for oxycodone

## 2022-12-02 NOTE — Telephone Encounter (Signed)
Requested medication (s) are due for refill today: no  Requested medication (s) are on the active medication list: yes  Last refill:  11/23/22  Future visit scheduled: yes  Notes to clinic:  Unable to refill per protocol, cannot delegate.  Unable to refuse, request is too soon.     Requested Prescriptions  Pending Prescriptions Disp Refills   Oxycodone HCl 10 MG TABS 60 tablet 0    Sig: Take 1 tablet (10 mg total) by mouth 4 (four) times daily as needed (modereate to severe pain).     Not Delegated - Analgesics:  Opioid Agonists Failed - 12/01/2022  4:49 PM      Failed - This refill cannot be delegated      Failed - Urine Drug Screen completed in last 360 days      Passed - Valid encounter within last 3 months    Recent Outpatient Visits           1 week ago Chronic foot pain, right   [place] [place], [name], DO   1 month ago Alcoholic cirrhosis of liver with ascites [facility])   [place], [name], DO   1 month ago Alcoholic cirrhosis of liver with ascites [facility])   [place] [facility] [place], [name], DO   2 months ago Alcoholic cirrhosis of liver with ascites [facility])   [place] [facility], [name], DO   5 months ago Alcoholic cirrhosis of liver with ascites [facility])    [facility] Althea Charon, [name], DO       Future Appointments             In 6 days Althea Charon, [name], DO  [facility]

## 2022-12-03 ENCOUNTER — Emergency Department: Payer: Medicaid Other

## 2022-12-03 ENCOUNTER — Observation Stay
Admission: EM | Admit: 2022-12-03 | Discharge: 2022-12-05 | Disposition: A | Discharge status: Home / Self Care | Payer: Medicaid Other | Attending: Internal Medicine | Admitting: Internal Medicine

## 2022-12-03 DIAGNOSIS — E876 Hypokalemia: Secondary | ICD-10-CM | POA: Diagnosis present

## 2022-12-03 DIAGNOSIS — Z79899 Other long term (current) drug therapy: Secondary | ICD-10-CM | POA: Insufficient documentation

## 2022-12-03 DIAGNOSIS — J449 Chronic obstructive pulmonary disease, unspecified: Secondary | ICD-10-CM | POA: Diagnosis present

## 2022-12-03 DIAGNOSIS — T84490A Other mechanical complication of muscle and tendon graft, initial encounter: Secondary | ICD-10-CM | POA: Insufficient documentation

## 2022-12-03 DIAGNOSIS — M25571 Pain in right ankle and joints of right foot: Secondary | ICD-10-CM | POA: Insufficient documentation

## 2022-12-03 DIAGNOSIS — T849XXA Unspecified complication of internal orthopedic prosthetic device, implant and graft, initial encounter: Secondary | ICD-10-CM | POA: Diagnosis present

## 2022-12-03 DIAGNOSIS — R911 Solitary pulmonary nodule: Secondary | ICD-10-CM | POA: Diagnosis not present

## 2022-12-03 DIAGNOSIS — R0602 Shortness of breath: Secondary | ICD-10-CM | POA: Diagnosis present

## 2022-12-03 DIAGNOSIS — Z87891 Personal history of nicotine dependence: Secondary | ICD-10-CM | POA: Insufficient documentation

## 2022-12-03 DIAGNOSIS — R140 Abdominal distension (gaseous): Secondary | ICD-10-CM | POA: Diagnosis present

## 2022-12-03 DIAGNOSIS — R188 Other ascites: Secondary | ICD-10-CM | POA: Diagnosis not present

## 2022-12-03 DIAGNOSIS — L97909 Non-pressure chronic ulcer of unspecified part of unspecified lower leg with unspecified severity: Secondary | ICD-10-CM | POA: Diagnosis not present

## 2022-12-03 DIAGNOSIS — R609 Edema, unspecified: Secondary | ICD-10-CM | POA: Diagnosis not present

## 2022-12-03 DIAGNOSIS — R1900 Intra-abdominal and pelvic swelling, mass and lump, unspecified site: Secondary | ICD-10-CM | POA: Diagnosis not present

## 2022-12-03 DIAGNOSIS — Z96651 Presence of right artificial knee joint: Secondary | ICD-10-CM | POA: Diagnosis not present

## 2022-12-03 DIAGNOSIS — K219 Gastro-esophageal reflux disease without esophagitis: Secondary | ICD-10-CM | POA: Diagnosis present

## 2022-12-03 DIAGNOSIS — G8929 Other chronic pain: Secondary | ICD-10-CM | POA: Diagnosis not present

## 2022-12-03 DIAGNOSIS — E785 Hyperlipidemia, unspecified: Secondary | ICD-10-CM | POA: Diagnosis present

## 2022-12-03 DIAGNOSIS — R069 Unspecified abnormalities of breathing: Secondary | ICD-10-CM | POA: Diagnosis not present

## 2022-12-03 DIAGNOSIS — I10 Essential (primary) hypertension: Secondary | ICD-10-CM | POA: Diagnosis present

## 2022-12-03 DIAGNOSIS — J4489 Other specified chronic obstructive pulmonary disease: Secondary | ICD-10-CM | POA: Diagnosis present

## 2022-12-03 HISTORY — DX: Unspecified asthma, uncomplicated: J45.909

## 2022-12-03 LAB — CBC WITH DIFFERENTIAL/PLATELET
Abs Immature Granulocytes: 0.02 10*3/uL (ref 0.00–0.07)
Basophils Absolute: 0.2 10*3/uL — ABNORMAL HIGH (ref 0.0–0.1)
Basophils Relative: 2 %
Eosinophils Absolute: 0.4 10*3/uL (ref 0.0–0.5)
Eosinophils Relative: 5 %
HCT: 33.7 % — ABNORMAL LOW (ref 39.0–52.0)
Hemoglobin: 11.1 g/dL — ABNORMAL LOW (ref 13.0–17.0)
Immature Granulocytes: 0 %
Lymphocytes Relative: 16 %
Lymphs Abs: 1.2 10*3/uL (ref 0.7–4.0)
MCH: 28.2 pg (ref 26.0–34.0)
MCHC: 32.9 g/dL (ref 30.0–36.0)
MCV: 85.5 fL (ref 80.0–100.0)
Monocytes Absolute: 1.2 10*3/uL — ABNORMAL HIGH (ref 0.1–1.0)
Monocytes Relative: 16 %
Neutro Abs: 4.4 10*3/uL (ref 1.7–7.7)
Neutrophils Relative %: 61 %
Platelets: 215 10*3/uL (ref 150–400)
RBC: 3.94 MIL/uL — ABNORMAL LOW (ref 4.22–5.81)
RDW: 16.4 % — ABNORMAL HIGH (ref 11.5–15.5)
WBC: 7.3 10*3/uL (ref 4.0–10.5)
nRBC: 0 % (ref 0.0–0.2)

## 2022-12-03 LAB — BASIC METABOLIC PANEL
Anion gap: 9 (ref 5–15)
BUN: 13 mg/dL (ref 6–20)
CO2: 22 mmol/L (ref 22–32)
Calcium: 8.2 mg/dL — ABNORMAL LOW (ref 8.9–10.3)
Chloride: 104 mmol/L (ref 98–111)
Creatinine, Ser: 1.32 mg/dL — ABNORMAL HIGH (ref 0.61–1.24)
GFR, Estimated: >60 mL/min (ref >60)
Glucose, Bld: 97 mg/dL (ref 70–99)
Potassium: 3.3 mmol/L — ABNORMAL LOW (ref 3.5–5.1)
Sodium: 135 mmol/L (ref 135–145)

## 2022-12-03 MED ORDER — OXYCODONE HCL 5 MG PO TABS: 5.0000 mg | ORAL_TABLET | ORAL | Status: DC | PRN

## 2022-12-03 MED ORDER — OXYCODONE HCL 5 MG PO TABS
10.0000 mg | ORAL_TABLET | Freq: Once | ORAL | Status: AC
  Administered 2022-12-03: 10 mg via ORAL
  Filled 2022-12-03: qty 2

## 2022-12-03 NOTE — ED Notes (Signed)
Pt up to the bedside commode 

## 2022-12-03 NOTE — ED Triage Notes (Signed)
PT BIBA from home where pt reports having worsening SOB due to abdominal distension. Pt had a Paracentesis done 2 weeks ago and has another appt on the 24th but reports I won't make it til then. Pt had ankle fusion done last July and takes Oxycodone 5mg  (last pill was at 1600 today) Pt states that he cannot get more pills until Tuesday. Pt requesting pain meds.

## 2022-12-03 NOTE — ED Provider Notes (Signed)
[facility] Provider Note    Event Date/Time   First MD Initiated Contact with Patient 12/03/22 2114     (approximate)   History   Shortness of Breath, Bloated, and Ankle Pain   HPI  Tom [name] is a 57 y.o. male   Past medical history of alcoholic cirrhosis, hypertension, chronic back pain, hypertension hyperlipidemia and COPD who presents to the emergency department with ankle pain and abdominal distention and shortness of breath feels that he needs to take fluid off.  Usually gets therapeutic paracentesis every 1 month last session was 1 month ago and he has no scheduled appointment for therapeutic paracentesis for another 9 days.  He does not think he can make it that long.  He is having trouble ambulating in his health due to abdomen distention and shortness of breath.  He denies any chest pain cough or fever.  He has ankle pain associated with his previous ankle surgery.  No new injuries.   External Medical Documents Reviewed: Discharge summary from May 2024 when he was admitted for pain control of right ankle      Physical Exam   Triage Vital Signs: ED Triage Vitals  Enc Vitals Group     BP 12/03/22 2115 (!) 145/90     Pulse Rate 12/03/22 2115 92     Resp 12/03/22 2115 18     Temp 12/03/22 2115 98.5 F (36.9 C)     Temp Source 12/03/22 2115 Oral     SpO2 12/03/22 2110 97 %     Weight 12/03/22 2118 276 lb 0.3 oz (125.2 kg)     Height 12/03/22 2118 6' (1.829 m)     Head Circumference --      Peak Flow --      Pain Score 12/03/22 2116 9     Pain Loc --      Pain Edu? --      Excl. in GC? --     Most recent vital signs: Vitals:   12/03/22 2110 12/03/22 2115  BP:  (!) 145/90  Pulse:  92  Resp:  18  Temp:  98.5 F (36.9 C)  SpO2: 97% 96%    General: Awake, no distress.  CV:  Good peripheral perfusion.  Resp:  Normal effort.  Abd:  No distention.  Other:  No infectious changes to the right ankle or foot.  No fever.   Abdominal distention nontender to palpation.  Oxygenation is normal and no respiratory distress.   ED Results / Procedures / Treatments   Labs (all labs ordered are listed, but only abnormal results are displayed) Labs Reviewed  BASIC METABOLIC PANEL - Abnormal; Notable for the following components:      Result Value   Potassium 3.3 (*)    Creatinine, Ser 1.32 (*)    Calcium 8.2 (*)    All other components within normal limits  CBC WITH DIFFERENTIAL/PLATELET - Abnormal; Notable for the following components:   RBC 3.94 (*)    Hemoglobin 11.1 (*)    HCT 33.7 (*)    RDW 16.4 (*)    Monocytes Absolute 1.2 (*)    Basophils Absolute 0.2 (*)    All other components within normal limits     I ordered and reviewed the above labs they are notable for white blood cell count is normal, H&H stable.    RADIOLOGY I independently reviewed and interpreted chest x-ray and see no obvious pneumothorax or focality   PROCEDURES:  Critical Care performed: No  Procedures   MEDICATIONS ORDERED IN ED: Medications  oxyCODONE (Oxy IR/ROXICODONE) immediate release tablet 10 mg (10 mg Oral Given 12/03/22 2159)    External physician / consultants:  I spoke with hospitalist for admission and regarding care plan for this patient.   IMPRESSION / MDM / ASSESSMENT AND PLAN / ED COURSE  I reviewed the triage vital signs and the nursing notes.                                Patient's presentation is most consistent with acute presentation with potential threat to life or bodily function.  Differential diagnosis includes, but is not limited to, chronic ankle pain, fracture dislocation of the ankle, infection of the ankle, abdominal distention due to ascites due to cirrhotic liver, SBP   The patient is on the cardiac monitor to evaluate for evidence of arrhythmia and/or significant heart rate changes.  MDM: Patient no longer drinks alcohol but has alcoholic cirrhosis leading to ascites and tense  abdominal distention causing shortness of breath unable to ambulate at home due to shortness of breath.  Needs paracentesis and cannot wait another 1 to 2 weeks for his scheduled session.  Doubt SBP given no fever, normal white blood cell count, nontender abdomen to palpation.  Admit for paracentesis.  Ongoing chronic ankle pain with no infectious changes, will give his home oxycodone         FINAL CLINICAL IMPRESSION(S) / ED DIAGNOSES   Final diagnoses:  Chronic pain of right ankle  Abdominal distension  Other ascites  Shortness of breath     Rx / DC Orders   ED Discharge Orders     None        Note:  This document was prepared using Dragon voice recognition software and may include unintentional dictation errors.    Pilar Jarvis, MD 12/03/22 2306

## 2022-12-04 ENCOUNTER — Encounter: Payer: Self-pay | Admitting: Family Medicine

## 2022-12-04 ENCOUNTER — Other Ambulatory Visit: Payer: Self-pay

## 2022-12-04 DIAGNOSIS — R140 Abdominal distension (gaseous): Secondary | ICD-10-CM

## 2022-12-04 DIAGNOSIS — I10 Essential (primary) hypertension: Secondary | ICD-10-CM

## 2022-12-04 DIAGNOSIS — R0602 Shortness of breath: Secondary | ICD-10-CM | POA: Diagnosis not present

## 2022-12-04 DIAGNOSIS — K7031 Alcoholic cirrhosis of liver with ascites: Secondary | ICD-10-CM

## 2022-12-04 DIAGNOSIS — E785 Hyperlipidemia, unspecified: Secondary | ICD-10-CM

## 2022-12-04 DIAGNOSIS — K219 Gastro-esophageal reflux disease without esophagitis: Secondary | ICD-10-CM

## 2022-12-04 DIAGNOSIS — J4489 Other specified chronic obstructive pulmonary disease: Secondary | ICD-10-CM

## 2022-12-04 DIAGNOSIS — J449 Chronic obstructive pulmonary disease, unspecified: Secondary | ICD-10-CM | POA: Diagnosis present

## 2022-12-04 DIAGNOSIS — E876 Hypokalemia: Secondary | ICD-10-CM

## 2022-12-04 HISTORY — DX: Hypokalemia: E87.6

## 2022-12-04 HISTORY — DX: Hypomagnesemia: E83.42

## 2022-12-04 LAB — BASIC METABOLIC PANEL
Anion gap: 10 (ref 5–15)
BUN: 14 mg/dL (ref 6–20)
CO2: 20 mmol/L — ABNORMAL LOW (ref 22–32)
Calcium: 8.1 mg/dL — ABNORMAL LOW (ref 8.9–10.3)
Chloride: 104 mmol/L (ref 98–111)
Creatinine, Ser: 1.32 mg/dL — ABNORMAL HIGH (ref 0.61–1.24)
GFR, Estimated: >60 mL/min (ref >60)
Glucose, Bld: 94 mg/dL (ref 70–99)
Potassium: 3.3 mmol/L — ABNORMAL LOW (ref 3.5–5.1)
Sodium: 134 mmol/L — ABNORMAL LOW (ref 135–145)

## 2022-12-04 LAB — CBC
HCT: 30.9 % — ABNORMAL LOW (ref 39.0–52.0)
Hemoglobin: 10.1 g/dL — ABNORMAL LOW (ref 13.0–17.0)
MCH: 28.3 pg (ref 26.0–34.0)
MCHC: 32.7 g/dL (ref 30.0–36.0)
MCV: 86.6 fL (ref 80.0–100.0)
Platelets: 179 10*3/uL (ref 150–400)
RBC: 3.57 MIL/uL — ABNORMAL LOW (ref 4.22–5.81)
RDW: 16.6 % — ABNORMAL HIGH (ref 11.5–15.5)
WBC: 6.9 10*3/uL (ref 4.0–10.5)
nRBC: 0 % (ref 0.0–0.2)

## 2022-12-04 LAB — MAGNESIUM: Magnesium: 1.6 mg/dL — ABNORMAL LOW (ref 1.7–2.4)

## 2022-12-04 MED ORDER — OXYCODONE HCL 5 MG PO TABS
10.0000 mg | ORAL_TABLET | ORAL | Status: DC | PRN
  Administered 2022-12-04 – 2022-12-05 (×6): 10 mg via ORAL
  Filled 2022-12-04 (×6): qty 2

## 2022-12-04 MED ORDER — SPIRONOLACTONE 25 MG PO TABS
25.0000 mg | ORAL_TABLET | Freq: Every day | ORAL | Status: DC
  Administered 2022-12-04 – 2022-12-05 (×2): 25 mg via ORAL
  Filled 2022-12-04 (×2): qty 1

## 2022-12-04 MED ORDER — FUROSEMIDE 20 MG PO TABS
20.0000 mg | ORAL_TABLET | Freq: Every day | ORAL | Status: DC
  Administered 2022-12-04 – 2022-12-05 (×2): 20 mg via ORAL
  Filled 2022-12-04 (×2): qty 1

## 2022-12-04 MED ORDER — MAGNESIUM OXIDE -MG SUPPLEMENT 400 (240 MG) MG PO TABS
800.0000 mg | ORAL_TABLET | Freq: Two times a day (BID) | ORAL | Status: DC
  Administered 2022-12-04 – 2022-12-05 (×3): 800 mg via ORAL
  Filled 2022-12-04 (×3): qty 2

## 2022-12-04 MED ORDER — MOMETASONE FURO-FORMOTEROL FUM 100-5 MCG/ACT IN AERO
2.0000 | INHALATION_SPRAY | Freq: Two times a day (BID) | RESPIRATORY_TRACT | Status: DC
  Administered 2022-12-04 – 2022-12-05 (×2): 2 via RESPIRATORY_TRACT
  Filled 2022-12-04 (×3): qty 8.8

## 2022-12-04 MED ORDER — ONDANSETRON HCL 4 MG/2ML IJ SOLN: 4.0000 mg | Freq: Four times a day (QID) | INTRAMUSCULAR | Status: DC | PRN

## 2022-12-04 MED ORDER — ONDANSETRON HCL 4 MG PO TABS: 4.0000 mg | ORAL_TABLET | Freq: Four times a day (QID) | ORAL | Status: DC | PRN

## 2022-12-04 MED ORDER — ACETAMINOPHEN 325 MG PO TABS: 650.0000 mg | ORAL_TABLET | Freq: Four times a day (QID) | ORAL | Status: DC | PRN

## 2022-12-04 MED ORDER — ENOXAPARIN SODIUM 80 MG/0.8ML IJ SOSY
65.0000 mg | PREFILLED_SYRINGE | INTRAMUSCULAR | Status: DC
  Administered 2022-12-04 – 2022-12-05 (×2): 65 mg via SUBCUTANEOUS
  Filled 2022-12-04 (×2): qty 0.65

## 2022-12-04 MED ORDER — POTASSIUM CHLORIDE 20 MEQ PO PACK
40.0000 meq | PACK | Freq: Once | ORAL | Status: AC
  Administered 2022-12-04: 40 meq via ORAL
  Filled 2022-12-04: qty 2

## 2022-12-04 MED ORDER — ALBUTEROL SULFATE (2.5 MG/3ML) 0.083% IN NEBU: 2.5000 mg | INHALATION_SOLUTION | RESPIRATORY_TRACT | Status: DC | PRN

## 2022-12-04 MED ORDER — MORPHINE SULFATE (PF) 2 MG/ML IV SOLN
2.0000 mg | INTRAVENOUS | Status: DC | PRN
  Administered 2022-12-04 – 2022-12-05 (×6): 2 mg via INTRAVENOUS
  Filled 2022-12-04 (×6): qty 1

## 2022-12-04 MED ORDER — PANTOPRAZOLE SODIUM 40 MG PO TBEC
40.0000 mg | DELAYED_RELEASE_TABLET | Freq: Every day | ORAL | Status: DC
  Administered 2022-12-04 – 2022-12-05 (×2): 40 mg via ORAL
  Filled 2022-12-04 (×2): qty 1

## 2022-12-04 MED ORDER — GABAPENTIN 100 MG PO CAPS: 100.0000 mg | ORAL_CAPSULE | Freq: Three times a day (TID) | ORAL | Status: DC

## 2022-12-04 MED ORDER — LACTULOSE 10 GM/15ML PO SOLN
10.0000 g | ORAL | Status: DC
  Administered 2022-12-04: 10 g via ORAL
  Filled 2022-12-04: qty 30

## 2022-12-04 MED ORDER — LIDOCAINE 5 % EX PTCH
1.0000 | MEDICATED_PATCH | Freq: Every day | CUTANEOUS | Status: DC
  Filled 2022-12-04 (×2): qty 1

## 2022-12-04 MED ORDER — FOLIC ACID 1 MG PO TABS
1.0000 mg | ORAL_TABLET | Freq: Every day | ORAL | Status: DC
  Administered 2022-12-04 – 2022-12-05 (×2): 1 mg via ORAL
  Filled 2022-12-04 (×2): qty 1

## 2022-12-04 MED ORDER — ADULT MULTIVITAMIN W/MINERALS CH
1.0000 | ORAL_TABLET | Freq: Every day | ORAL | Status: DC
  Administered 2022-12-04 – 2022-12-05 (×2): 1 via ORAL
  Filled 2022-12-04 (×2): qty 1

## 2022-12-04 MED ORDER — DICLOFENAC SODIUM 1 % EX GEL
4.0000 g | Freq: Four times a day (QID) | CUTANEOUS | Status: DC
  Filled 2022-12-04: qty 100

## 2022-12-04 MED ORDER — ONDANSETRON 4 MG PO TBDP: 4.0000 mg | ORAL_TABLET | Freq: Three times a day (TID) | ORAL | Status: DC | PRN

## 2022-12-04 MED ORDER — ACETAMINOPHEN 650 MG RE SUPP: 650.0000 mg | Freq: Four times a day (QID) | RECTAL | Status: DC | PRN

## 2022-12-04 MED ORDER — OXYCODONE HCL 5 MG PO TABS
10.0000 mg | ORAL_TABLET | Freq: Four times a day (QID) | ORAL | Status: DC | PRN
  Administered 2022-12-04: 10 mg via ORAL
  Filled 2022-12-04: qty 2

## 2022-12-04 MED ORDER — MAGNESIUM HYDROXIDE 400 MG/5ML PO SUSP: 30.0000 mL | Freq: Every day | ORAL | Status: DC | PRN

## 2022-12-04 MED ORDER — IPRATROPIUM-ALBUTEROL 0.5-2.5 (3) MG/3ML IN SOLN
3.0000 mL | RESPIRATORY_TRACT | Status: DC | PRN
  Administered 2022-12-04 – 2022-12-05 (×2): 3 mL via RESPIRATORY_TRACT
  Filled 2022-12-04 (×2): qty 3

## 2022-12-04 MED ORDER — TRAZODONE HCL 50 MG PO TABS
25.0000 mg | ORAL_TABLET | Freq: Every evening | ORAL | Status: DC | PRN
  Administered 2022-12-04: 25 mg via ORAL
  Filled 2022-12-04: qty 1

## 2022-12-04 NOTE — Assessment & Plan Note (Addendum)
Stable, not exacerbated. --PRN [facility]

## 2022-12-04 NOTE — Assessment & Plan Note (Addendum)
Continue home antihypertensives 

## 2022-12-04 NOTE — Progress Notes (Signed)
PHARMACIST - PHYSICIAN COMMUNICATION  CONCERNING:  Enoxaparin (Lovenox) for DVT Prophylaxis    RECOMMENDATION: Patient was prescribed enoxaprin 40mg  q24 hours for VTE prophylaxis.   Filed Weights   12/03/22 2118  Weight: 125.2 kg (276 lb 0.3 oz)    Body mass index is 37.43 kg/m.  Estimated Creatinine Clearance: 83.3 mL/min (A) (by C-G formula based on SCr of 1.32 mg/dL (H)).   Based on [facility] policy patient is candidate for enoxaparin 0.5mg /kg TBW SQ every 24 hours based on BMI being >30.  DESCRIPTION: Pharmacy has adjusted enoxaparin dose per [facility] policy.  Patient is now receiving enoxaparin 0.5 mg/kg every 24 hours   Otelia Sergeant, PharmD, [facility] 12/04/2022 12:18 AM

## 2022-12-04 NOTE — Assessment & Plan Note (Addendum)
IR consulted for U/S-guided paracentesis. 6.9 L ascitic fluid removed this AM without complication. Cytospin of peritoneal fluid is negative for malignancy. --Continue Lasix, Aldactone  --Low sodium diet & fluid restriction --Follow up with GI / Hepatology --Consider for TIPS if requiring frequent paracenteses

## 2022-12-04 NOTE — Assessment & Plan Note (Addendum)
Continue PPI and H2 blocker 

## 2022-12-04 NOTE — H&P (Signed)
[facility]   PATIENT NAME: Tom [name]    MR#:  [id number]  DATE OF BIRTH:  April 06, 1966  DATE OF ADMISSION:  12/03/2022  PRIMARY CARE PHYSICIAN: [name], DO   Patient is coming from: Home  REQUESTING/REFERRING PHYSICIAN: Pilar Jarvis, MD  CHIEF COMPLAINT:   Chief Complaint  Patient presents with   Shortness of Breath   Bloated   Ankle Pain    HISTORY OF PRESENT ILLNESS:  Tom [name] is a 57 y.o. male with medical history significant for COPD/asthma, hypertension, dyslipidemia, osteoporosis and osteoarthritis, presented to the emergency room with acute onset of worsening dyspnea with associated abdominal distention.  He admits to lower extremity edema as well as dyspnea on exertion.  He had a worse ankle edema on the right compared to the left after having a right ankle fusion in July of last year.  He denies any fever or chills.  No chest pain or palpitations.  No dysuria, oliguria or hematuria or flank pain.  No bleeding diathesis.  He continues to have occasional right ankle pain.  ED Course: When he came to the ER, BP was 145/90 with otherwise normal vital signs.  Labs revealed mild hypokalemia 3.3 and a BUN of 13 with a creatinine 1.32 compared to 17/1.63 on 11/16/2022.  CBC showed anemia similar to previous levels.  EKG as reviewed by me :EKG showed normal sinus rhythm with a rate of 88 with low voltage QRS and poor R wave progression Imaging: Portable chest x-ray showed bilateral calcified pulmonary nodules with no acute cardiopulmonary disease.  The patient was given 10 mg of p.o. oxycodone.  He will be admitted to an observation medical bed for further evaluation and management. PAST MEDICAL HISTORY:   Past Medical History:  Diagnosis Date   Arthritis    COPD (chronic obstructive pulmonary disease) (HCC)    Hyperlipidemia    Hypertension    Osteoporosis     PAST SURGICAL HISTORY:   Past Surgical History:  Procedure  Laterality Date   ANKLE FUSION Right 2017   BACK SURGERY     TOTAL KNEE ARTHROPLASTY Right 04/2012    SOCIAL HISTORY:   Social History   Tobacco Use   Smoking status: Former    Packs/day: 1.00    Years: 39.00    Additional pack years: 0.00    Total pack years: 39.00    Types: Cigarettes    Quit date: 12/2015    Years since quitting: 6.9   Smokeless tobacco: Never  Substance Use Topics   Alcohol use: No    FAMILY HISTORY:  No family history on file.  DRUG ALLERGIES:   Allergies  Allergen Reactions   Alfuzosin Other (See Comments)    Dizziness    Amlodipine Nausea And Vomiting   Bee Venom Swelling   Cymbalta [Duloxetine Hcl] Other (See Comments)    Emesis    Duloxetine Other (See Comments)    Emesis   Lisinopril    Flomax [Tamsulosin Hcl] Itching    REVIEW OF SYSTEMS:   ROS As per history of present illness. All pertinent systems were reviewed above. Constitutional, HEENT, cardiovascular, respiratory, GI, GU, musculoskeletal, neuro, psychiatric, endocrine, integumentary and hematologic systems were reviewed and are otherwise negative/unremarkable except for positive findings mentioned above in the HPI.   MEDICATIONS AT HOME:   Prior to Admission medications   Medication Sig Start Date End Date Taking? Authorizing Provider  albuterol (PROVENTIL) (2.5 MG/3ML) 0.083% nebulizer solution Take 3  mLs (2.5 mg total) by nebulization every 4 (four) hours as needed for wheezing or shortness of breath. 11/16/22  Yes Sreenath, Sudheer B, MD  atorvastatin (LIPITOR) 40 MG tablet Take 1 tablet (40 mg total) by mouth daily. 03/21/22  Yes [name], [name], DO  budesonide-formoterol (SYMBICORT) 80-4.5 MCG/ACT inhaler Take 2 puffs first thing in am and then another 2 puffs about 12 hours later. 03/21/22  Yes [name], [name], DO  diclofenac Sodium (VOLTAREN) 1 % GEL Apply 4 g topically 4 (four) times daily. 11/16/22  Yes Sreenath, Jonelle Sports, MD  Docusate Sodium (DSS)  100 MG CAPS TAKE 1 CAPSULE (100 MG TOTAL) BY MOUTH TWO (2) TIMES A DAY   Yes [provider]  famotidine (PEPCID) 20 MG tablet Take 1 tablet (20 mg total) by mouth daily after supper. One after supper 03/21/22  Yes [name], [name], DO  folic acid (FOLVITE) 1 MG tablet Take 1 tablet (1 mg total) by mouth daily. 11/03/22  Yes [name], [name], DO  furosemide (LASIX) 20 MG tablet Take 1 tablet (20 mg total) by mouth daily. 10/20/22  Yes [name], [name], DO  Homeopathic Products (LIVER SUPPORT SL) Place under the tongue.   Yes [provider]  hydrOXYzine (VISTARIL) 25 MG capsule Take 1 capsule (25 mg total) by mouth every 8 (eight) hours as needed. 10/10/22  Yes [name], [name], DO  lactulose (CHRONULAC) 10 GM/15ML solution SMARTSIG:15 Milliliter(s) By Mouth Every Other Day 10/20/22  Yes [name], [name], DO  lidocaine (LIDODERM) 5 % Place 1 patch onto the skin daily.   Yes [provider]  Multiple Vitamin (MULTIVITAMIN) capsule Take 1 capsule by mouth daily.   Yes [provider]  ondansetron (ZOFRAN-ODT) 4 MG disintegrating tablet Take 1 tablet (4 mg total) by mouth every 8 (eight) hours as needed for nausea or vomiting. 10/10/22  Yes [name], [name], DO  Oxycodone HCl 10 MG TABS Take 1 tablet (10 mg total) by mouth 4 (four) times daily as needed (modereate to severe pain). 12/02/22  Yes [name], [name], DO  pantoprazole (PROTONIX) 40 MG tablet Take 1 tablet (40 mg total) by mouth daily before breakfast. 06/01/22  Yes [name], [name], DO  spironolactone (ALDACTONE) 25 MG tablet Take 1 tablet (25 mg total) by mouth daily. 10/20/22  Yes [name], [name], DO  carvedilol (COREG) 3.125 MG tablet Take 3.125 mg by mouth 2 (two) times daily. Patient not taking: Reported on 12/03/2022 09/14/22   [provider]  clindamycin (CLEOCIN) 300 MG capsule  11/04/22   [provider]  EPINEPHrine  (EPIPEN 2-PAK) 0.3 mg/0.3 mL IJ SOAJ injection Inject 0.3 mg into the muscle as needed for anaphylaxis. 12/16/21   [name], [name], DO  gabapentin (NEURONTIN) 100 MG capsule Take 100 mg by mouth 3 (three) times daily. Patient not taking: Reported on 11/23/2022 10/26/22   [provider]  magnesium oxide (MAG-OX) 400 (240 Mg) MG tablet Take 2 tablets by mouth 2 (two) times daily. 09/14/22   [provider]  promethazine (PHENERGAN) 12.5 MG tablet     [provider]      VITAL SIGNS:  Blood pressure (!) 145/90, pulse 92, temperature 98.5 F (36.9 C), temperature source Oral, resp. rate 18, height 6' (1.829 m), weight 125.2 kg, SpO2 96 %.  PHYSICAL EXAMINATION:  Physical Exam  GENERAL:  57 y.o.-year-old Caucasian male patient lying in the bed with no acute distress.  EYES: Pupils equal, round, reactive to  light and accommodation. No scleral icterus. Extraocular muscles intact.  HEENT: Head atraumatic, normocephalic. Oropharynx and nasopharynx clear.  NECK:  Supple, no jugular venous distention. No thyroid enlargement, no tenderness.  LUNGS: Normal breath sounds bilaterally, no wheezing, rales,rhonchi or crepitation. No use of accessory muscles of respiration.  CARDIOVASCULAR: Regular rate and rhythm, S1, S2 normal. No murmurs, rubs, or gallops.  ABDOMEN: Soft, distended with positive shifting dullness and nontender. Bowel sounds present. No organomegaly or mass could be palpated due to significant ascites.  EXTREMITIES: 1-2+ right lower extremity pitting edema more on the right than the left without cyanosis, or clubbing.  NEUROLOGIC: Cranial nerves II through XII are intact. Muscle strength 5/5 in all extremities. Sensation intact. Gait not checked.  PSYCHIATRIC: The patient is alert and oriented x 3.  Normal affect and good eye contact. SKIN: No obvious rash, lesion, or ulcer.   LABORATORY PANEL:   CBC Recent Labs  Lab 12/03/22 2202  WBC 7.3  HGB 11.1*   HCT 33.7*  PLT 215   ------------------------------------------------------------------------------------------------------------------  Chemistries  Recent Labs  Lab 12/03/22 2202  NA 135  K 3.3*  CL 104  CO2 22  GLUCOSE 97  BUN 13  CREATININE 1.32*  CALCIUM 8.2*   ------------------------------------------------------------------------------------------------------------------  Cardiac Enzymes No results for input(s): "TROPONINI" in the last 168 hours. ------------------------------------------------------------------------------------------------------------------  RADIOLOGY:  DG Chest Port 1 View  Result Date: 12/03/2022 CLINICAL DATA:  Shortness of breath EXAM: PORTABLE CHEST 1 VIEW COMPARISON:  Chest x-ray 12/02/2021 FINDINGS: Bilateral calcified nodules are again seen. There is no focal lung infiltrate, pleural effusion or pneumothorax. The cardiomediastinal silhouette is within normal limits. No acute fractures are seen. Orthopedic plate is seen in the right clavicle. IMPRESSION: 1. No active disease. 2. Bilateral calcified pulmonary nodules. Electronically Signed   By: Darliss Cheney M.D.   On: 12/03/2022 22:18      IMPRESSION AND PLAN:  Assessment and Plan: * Ascites - This is fairly symptomatic with abdominal distention and dyspnea. - The patient will be admitted to a medical observation bed. - Will consult IR for paracentesis in AM. - Paracentesis labs were ordered. - Pain management to be provided.  Hypokalemia - Potassium was replaced and magnesium level be checked.  Essential hypertension - We will continue his antihypertensive therapy.  Dyslipidemia - We will continue statin therapy.  GERD without esophagitis - We will continue PPI therapy as well as H2 blocker therapy.  COPD with asthma - We will continue her inhalers and place him on as needed DuoNebs.   DVT prophylaxis: Lovenox.  Advanced Care Planning:  Code Status: full code.  Family  Communication:  The plan of care was discussed in details with the patient (and family). I answered all questions. The patient agreed to proceed with the above mentioned plan. Further management will depend upon hospital course. Disposition Plan: Back to previous home environment Consults called: none.  All the records are reviewed and case discussed with ED provider.  Status is: Observation  I certify that at the time of admission, it is my clinical judgment that the patient will require inpatient hospital care extending lessthan 2 midnights.                            Dispo: The patient is from: Home              Anticipated d/c is to: Home  Patient currently is not medically stable to d/c.              Difficult to place patient: No  Hannah Beat M.D on 12/04/2022 at 1:32 AM  [facility]   From 7 PM-7 AM, contact night-coverage [email or web address]  CC: Primary care physician; [name], DO

## 2022-12-04 NOTE — Progress Notes (Signed)
  Progress Note   Patient: Tom [name]:[id number] DOB: 1965/10/10 DOA: 12/03/2022     0 DOS: the patient was seen and examined on 12/04/2022   Brief hospital course:  Tom [name] is a 57 y.o. male with medical history significant for COPD/asthma, hypertension, dyslipidemia, osteoporosis and osteoarthritis, presented to the ED on evening of 12/03/22 for evaluation of worsening dyspnea and progressive abdominal distention.    He was admitted for observation to undergo US-guided paracentesis with IR.  6/16 - IR not in house on weekends.  Paracentesis will be delayed until tomorrow.  Pt understands and agreeable.  Resumed home diuretics.   Assessment and Plan: * Ascites IR consulted for U/S-guided paracentesis. Procedure won't be until tomorrow when IR services resume. --NPO after midnight --Home Lasix, Aldactone resumed --Low sodium diet & fluid restriction  Hypokalemia K 3.3 was replaced. Monitor BMP & replace PRN Check Mg level  Essential hypertension --Continue home antihypertensives  Dyslipidemia --Continue statin   Hypomagnesemia Mg 1.6 - replacing with IV Mg-sulfate. Monitor Mg & replace PRN  Shortness of breath Due to abdominal distention from progressive ascites.  Abdominal distension See Ascites  GERD without esophagitis --Continue PPI and H2 blocker   COPD with asthma Stable, not exacerbated. --PRN DuoNebs        Subjective: Pt seen in the ED, holding for a bed.  He reports uncontrolled pain, mostly his ankle which is chronic.  States his surgeon told him nothing else can be done to repair it.  He states BLE edema has been progressive along with his abdominal distention. States he messed up and ate a bunch of pickles which he loves, then started getting more and more swollen.  No other acute complaints.   Physical Exam: Vitals:   12/04/22 1115 12/04/22 1200 12/04/22 1230 12/04/22 1300  BP: 118/66 137/75 111/79 129/78  Pulse: 96  93 95 92  Resp: 16 15 (!) 26 16  Temp: 97.9 F (36.6 C)     TempSrc: Oral     SpO2: 94% 95% 97% 95%  Weight:      Height:       General exam: awake, alert, no acute distress, chronically ill-appearing HEENT: moist mucus membranes, hearing grossly normal  Respiratory system: CTAB diminished bases due to shallow inspirations, no wheezes, rales or rhonchi, normal respiratory effort. Cardiovascular system: normal S1/S2, RRR, BLE pitting edema   Gastrointestinal system: distended abdomen, non-tender Central nervous system: A&O x3. no gross focal neurologic deficits, normal speech Extremities: moves all, BLE pitting edema, normal tone Skin: dry, intact, normal temperature, no rashes seen on visualized skin Psychiatry: normal mood, congruent affect, judgement and insight appear normal   Data Reviewed:  Notable labs --- Na 134, K 3.3, bicarb 20, Cr improving 1.32 from 1.63, Mg 1.6k Hbg 10.1  Family Communication: None. Pt is able to update.  Disposition: Status is: Observation The patient remains OBS appropriate and will d/c before 2 midnights. Remains admitted for paracentesis with IR tomorrow.  No IR services available here on weekends.   Planned Discharge Destination: Home    Time spent: 42 minutes  Author: Pennie Banter, DO 12/04/2022 1:59 PM  For on call review [email or web address].

## 2022-12-04 NOTE — Assessment & Plan Note (Signed)
Due to abdominal distention from progressive ascites.

## 2022-12-04 NOTE — Assessment & Plan Note (Addendum)
K 3.3 was replaced. Monitor BMP & replace PRN Check Mg level

## 2022-12-04 NOTE — Assessment & Plan Note (Signed)
Mg 1.6 - replacing with IV Mg-sulfate. Monitor Mg & replace PRN

## 2022-12-04 NOTE — Assessment & Plan Note (Addendum)
Continue statin. 

## 2022-12-04 NOTE — Assessment & Plan Note (Signed)
See Ascites

## 2022-12-04 NOTE — Consult Note (Signed)
WOC Nurse Consult Note: Reason for Consult:right heel stage 2 pressure injury Wound type:pressure Pressure Injury POA: Yes Measurement:0.5cm x 0.1cm x 0.1cm Wound ZOX:WRUE Drainage (amount, consistency, odor) small serous Periwound:intact, edematous LE Dressing procedure/placement/frequency:I have provided Nursing with guidance for the care of this wound using a daily soap and water cleanse followed by covering the open area with a silver hydrofiber and topping with a silicone foam dressing for the heel. The heels are to be placed into pressure redistribution heel boots and a sacral foam placed to the sacrum for PI prevention.  POC discussed with Bedside RN, Esther Hardy via Secure Chat.  WOC nursing team will not follow, but will remain available to this patient, the nursing and medical teams.  Please re-consult if needed.  Thank you for inviting [place] to participate in this patient's Plan of Care.  Ladona Mow, MSN, RN, CNS, GNP, Leda Min, [employer], [employer] phone:  [phone]

## 2022-12-05 ENCOUNTER — Encounter: Payer: Self-pay | Admitting: Family Medicine

## 2022-12-05 ENCOUNTER — Telehealth: Payer: Self-pay

## 2022-12-05 ENCOUNTER — Observation Stay: Payer: Medicaid Other

## 2022-12-05 DIAGNOSIS — Z515 Encounter for palliative care: Secondary | ICD-10-CM

## 2022-12-05 DIAGNOSIS — T8484XA Pain due to internal orthopedic prosthetic devices, implants and grafts, initial encounter: Secondary | ICD-10-CM

## 2022-12-05 DIAGNOSIS — R188 Other ascites: Secondary | ICD-10-CM | POA: Diagnosis not present

## 2022-12-05 DIAGNOSIS — R140 Abdominal distension (gaseous): Secondary | ICD-10-CM | POA: Diagnosis not present

## 2022-12-05 DIAGNOSIS — T849XXA Unspecified complication of internal orthopedic prosthetic device, implant and graft, initial encounter: Secondary | ICD-10-CM | POA: Diagnosis not present

## 2022-12-05 DIAGNOSIS — I10 Essential (primary) hypertension: Secondary | ICD-10-CM | POA: Diagnosis not present

## 2022-12-05 DIAGNOSIS — M19071 Primary osteoarthritis, right ankle and foot: Secondary | ICD-10-CM

## 2022-12-05 DIAGNOSIS — M5136 Other intervertebral disc degeneration, lumbar region: Secondary | ICD-10-CM

## 2022-12-05 DIAGNOSIS — G8929 Other chronic pain: Secondary | ICD-10-CM

## 2022-12-05 DIAGNOSIS — K7460 Unspecified cirrhosis of liver: Secondary | ICD-10-CM | POA: Diagnosis not present

## 2022-12-05 LAB — BODY FLUID CULTURE W GRAM STAIN: Gram Stain: NONE SEEN

## 2022-12-05 LAB — BASIC METABOLIC PANEL
Anion gap: 8 (ref 5–15)
BUN: 14 mg/dL (ref 6–20)
CO2: 22 mmol/L (ref 22–32)
Calcium: 7.9 mg/dL — ABNORMAL LOW (ref 8.9–10.3)
Chloride: 103 mmol/L (ref 98–111)
Creatinine, Ser: 1.43 mg/dL — ABNORMAL HIGH (ref 0.61–1.24)
GFR, Estimated: 57 mL/min — ABNORMAL LOW (ref >60)
Glucose, Bld: 114 mg/dL — ABNORMAL HIGH (ref 70–99)
Potassium: 3.3 mmol/L — ABNORMAL LOW (ref 3.5–5.1)
Sodium: 133 mmol/L — ABNORMAL LOW (ref 135–145)

## 2022-12-05 LAB — AMYLASE, PLEURAL OR PERITONEAL FLUID: Amylase, Fluid: 13 U/L

## 2022-12-05 LAB — BODY FLUID CELL COUNT WITH DIFFERENTIAL
Eos, Fluid: 0 %
Lymphs, Fluid: 63 %
Monocyte-Macrophage-Serous Fluid: 35 %
Neutrophil Count, Fluid: 2 %
Total Nucleated Cell Count, Fluid: 52 cu mm

## 2022-12-05 LAB — LACTATE DEHYDROGENASE, PLEURAL OR PERITONEAL FLUID: LD, Fluid: 73 U/L — ABNORMAL HIGH (ref 3–23)

## 2022-12-05 LAB — MAGNESIUM: Magnesium: 1.7 mg/dL (ref 1.7–2.4)

## 2022-12-05 LAB — PATHOLOGIST SMEAR REVIEW

## 2022-12-05 MED ORDER — OXYCODONE HCL 10 MG PO TABS: 10.0000 mg | ORAL_TABLET | ORAL | 0 refills | Status: DC | PRN

## 2022-12-05 MED ORDER — LIDOCAINE HCL (PF) 1 % IJ SOLN
10.0000 mL | Freq: Once | INTRAMUSCULAR | Status: AC
  Administered 2022-12-05: 10 mL via INTRADERMAL
  Filled 2022-12-05: qty 10

## 2022-12-05 MED ORDER — OXYCODONE HCL 10 MG PO TABS
10.0000 mg | ORAL_TABLET | ORAL | 0 refills | Status: AC | PRN
Start: 2022-12-05 — End: 2022-12-06

## 2022-12-05 MED ORDER — POTASSIUM CHLORIDE CRYS ER 20 MEQ PO TBCR
40.0000 meq | EXTENDED_RELEASE_TABLET | ORAL | Status: AC
  Administered 2022-12-05 (×2): 40 meq via ORAL
  Filled 2022-12-05 (×2): qty 2

## 2022-12-05 MED ORDER — LIDOCAINE HCL (PF) 1 % IJ SOLN
20.0000 mL | Freq: Once | INTRAMUSCULAR | Status: AC
  Administered 2022-12-05: 20 mL via INTRADERMAL
  Filled 2022-12-05: qty 20

## 2022-12-05 NOTE — TOC CM/SW Note (Signed)
Transition of Care [facility]) - Inpatient Brief Assessment   Patient Details  Name: Tom [name] MRN: [id number] Date of Birth: Jun 09, 1966  Transition of Care [facility]) CM/SW Contact:    Margarito Liner, LCSW Phone Number: 12/05/2022, 2:03 PM   Clinical Narrative: Patient has orders to discharge home today. Chart reviewed. No TOC needs identified. CSW signing off.  Transition of Care Asessment: Insurance and Status: Insurance coverage has been reviewed Patient has primary care physician: Yes Home environment has been reviewed: Single family home Prior level of function:: Not documented Prior/Current Home Services: No current home services Social Determinants of Health Reivew: SDOH reviewed no interventions necessary Readmission risk has been reviewed: Yes Transition of care needs: no transition of care needs at this time

## 2022-12-05 NOTE — Discharge Summary (Signed)
Physician Discharge Summary   Patient: Tom [name] MRN: [id number] DOB: 10/03/1965  Admit date:     12/03/2022  Discharge date: 12/05/2022  Discharge Physician: Pennie Banter   PCP: [name], DO   Recommendations at discharge:    Follow up with Primary Care Follow up with Gastroenterology Hepatology Follow up with Podiatry Repeat CBC, CMP, coags in 1-2 weeks If the patient eventually requires >/=2 paracenteses in a 30 day period, refer for formal evaluation by the [facility] Interventional Radiology Portal Hypertension Clinic   Discharge Diagnoses: Principal Problem:   Ascites Active Problems:   Complication associated with orthopedic device (HCC)   Essential hypertension   Dyslipidemia   COPD with asthma   GERD without esophagitis   Abdominal distension   Shortness of breath  Resolved Problems:   Hypokalemia   Hypomagnesemia  Hospital Course:  [name] is a 57 y.o. male with medical history significant for COPD/asthma, hypertension, dyslipidemia, osteoporosis and osteoarthritis, presented to the ED on evening of 12/03/22 for evaluation of worsening dyspnea and progressive abdominal distention.     He was admitted for observation to undergo US-guided paracentesis with IR.   6/17 - pt underwent paracentesis with 6.9 L fluid removed.   Hemodynamically stable post-procdure. Some leaking from the para site, IR came to bedside to address with derma-bond.   Pt medically stable and agreeable to discharge.   Pt requested 1 day Rx for his chronic oxycodone until refill could be filled.  Confirmed with PCP by secure chat, he was made aware and agreeable given pt having severe uncontrolled foot/ankle pain.   Assessment and Plan: * Ascites IR consulted for U/S-guided paracentesis. 6.9 L ascitic fluid removed this AM without complication. Cytospin of peritoneal fluid is negative for malignancy. --Continue Lasix, Aldactone  --Low sodium  diet & fluid restriction --Follow up with GI / Hepatology --Consider for TIPS if requiring frequent paracenteses  Complication associated with orthopedic device (HCC) Right foot and ankle pain Right heel ulcer On oxycodone 10 mg QID outpatient. Currently out of medication at home.  Provided 1 day prescription and PCP made aware. Follow up with Podiatry Continue wound care  Hypokalemia-resolved as of 12/05/2022 K 3.3 was replaced. Monitor BMP & replace PRN Check Mg level  Essential hypertension --Continue home antihypertensives  Dyslipidemia --Continue statin   Shortness of breath Due to abdominal distention from progressive ascites.  Abdominal distension Improved after paracentesis See Ascites  GERD without esophagitis --Continue PPI and H2 blocker   COPD with asthma Stable, not exacerbated. --PRN DuoNebs  Hypomagnesemia-resolved as of 12/05/2022 Mg 1.6 - replacing with IV Mg-sulfate. Monitor Mg & replace PRN         Consultants: IR Procedures performed: Paracentesis   Disposition: Home  Diet recommendation:  Low Sodium, 1500 cc fluid restriction   DISCHARGE MEDICATION: Allergies as of 12/05/2022       Reactions   Alfuzosin Other (See Comments)   Dizziness    Amlodipine Nausea And Vomiting   Bee Venom Swelling   Cymbalta [duloxetine Hcl] Other (See Comments)   Emesis   Duloxetine Other (See Comments)   Emesis   Lisinopril    Flomax [tamsulosin Hcl] Itching        Medication List     TAKE these medications    albuterol (2.5 MG/3ML) 0.083% nebulizer solution Commonly known as: PROVENTIL Take 3 mLs (2.5 mg total) by nebulization every 4 (four) hours as needed for wheezing or shortness of breath.   atorvastatin  40 MG tablet Commonly known as: LIPITOR Take 1 tablet (40 mg total) by mouth daily.   budesonide-formoterol 80-4.5 MCG/ACT inhaler Commonly known as: Symbicort Take 2 puffs first thing in am and then another 2 puffs about 12  hours later.   diclofenac Sodium 1 % Gel Commonly known as: VOLTAREN Apply 4 g topically 4 (four) times daily.   DSS 100 MG Caps TAKE 1 CAPSULE (100 MG TOTAL) BY MOUTH TWO (2) TIMES A DAY   EPINEPHrine 0.3 mg/0.3 mL Soaj injection Commonly known as: EpiPen 2-Pak Inject 0.3 mg into the muscle as needed for anaphylaxis.   famotidine 20 MG tablet Commonly known as: Pepcid Take 1 tablet (20 mg total) by mouth daily after supper. One after supper   folic acid 1 MG tablet Commonly known as: FOLVITE Take 1 tablet (1 mg total) by mouth daily.   furosemide 20 MG tablet Commonly known as: LASIX Take 1 tablet (20 mg total) by mouth daily.   hydrOXYzine 25 MG capsule Commonly known as: VISTARIL Take 1 capsule (25 mg total) by mouth every 8 (eight) hours as needed.   lactulose 10 GM/15ML solution Commonly known as: CHRONULAC SMARTSIG:15 Milliliter(s) By Mouth Every Other Day   lidocaine 5 % Commonly known as: LIDODERM Place 1 patch onto the skin daily.   LIVER SUPPORT SL Place under the tongue.   magnesium oxide 400 (240 Mg) MG tablet Commonly known as: MAG-OX Take 2 tablets by mouth 2 (two) times daily.   multivitamin capsule Take 1 capsule by mouth daily.   ondansetron 4 MG disintegrating tablet Commonly known as: ZOFRAN-ODT Take 1 tablet (4 mg total) by mouth every 8 (eight) hours as needed for nausea or vomiting.   Oxycodone HCl 10 MG Tabs Take 1 tablet (10 mg total) by mouth 4 (four) times daily as needed (modereate to severe pain). What changed: Another medication with the same name was added. Make sure you understand how and when to take each.   Oxycodone HCl 10 MG Tabs Take 1 tablet (10 mg total) by mouth every 4 (four) hours as needed for up to 1 day (pain). What changed: You were already taking a medication with the same name, and this prescription was added. Make sure you understand how and when to take each.   pantoprazole 40 MG tablet Commonly known as:  Protonix Take 1 tablet (40 mg total) by mouth daily before breakfast.   spironolactone 25 MG tablet Commonly known as: ALDACTONE Take 1 tablet (25 mg total) by mouth daily.               Discharge Care Instructions  (From admission, onward)           Start     Ordered   12/05/22 0000  Discharge wound care:       Comments: Cleanse with soap and water, rinse and pat dry. Cover with size appropriate piece of silver hydrofiber (Aquacel Ag+ Advantage, [employer] [place] # [id number]),  cover with silicone foam for heel.   12/05/22 1347            Discharge Exam: Filed Weights   12/03/22 2118  Weight: 125.2 kg   General exam: awake, alert, no acute distress but appears in pain HEENT: moist mucus membranes, hearing grossly normal  Respiratory system: on room air, normal respiratory effort. Cardiovascular system: normal S1/S2, RRR, stable BLE pitting edema.   Gastrointestinal system: soft, NT, less distended, paracentesis site leaking small amount of fluid Central nervous system: A&O  x 4. no gross focal neurologic deficits, normal speech Skin: dry, intact, normal temperature Psychiatry: normal mood, congruent affect, judgement and insight appear normal   Condition at discharge: stable  The results of significant diagnostics from this hospitalization (including imaging, microbiology, ancillary and laboratory) are listed below for reference.   Imaging Studies: US Paracentesis  Result Date: 12/05/2022 INDICATION: Patient with a history of cirrhosis with recurrent ascites. Interventional radiology asked to perform a diagnostic and therapeutic paracentesis. EXAM: ULTRASOUND GUIDED PARACENTESIS MEDICATIONS: 1% lidocaine 20 mL COMPLICATIONS: None immediate. PROCEDURE: Informed written consent was obtained from the patient after a discussion of the risks, benefits and alternatives to treatment. A timeout was performed prior to the initiation of the procedure. Initial ultrasound scanning  demonstrates a large amount of ascites within the right lower abdominal quadrant. The right lower abdomen was prepped and draped in the usual sterile fashion. 1% lidocaine was used for local anesthesia. Following this, a 19 gauge, 7-cm, Yueh catheter was introduced. An ultrasound image was saved for documentation purposes. The paracentesis was performed. After approximately 1 L the patient inadvertently dislodged the Yueh and bright red blood was being pulled into the catheter. The Elick Aguilera Citron was removed. Ultrasound imaging demonstrated large volume ascites and the site was re-prepped and a new 7-cm Glennette Galster Citron was inserted into the same puncture site with return of clear yellow peritoneal fluid. After approximately another liter of fluid removal the catheter stopped draining. Ultrasound imaging demonstrated large volume ascites and a new area in the right lower quadrant was prepped and draped in the normal sterile fashion. This new site was anesthetized with 1% lidocaine and a 10 cm Yueh was inserted. The paracentesis was performed. The catheter was removed and a dressing was applied. The patient tolerated the procedure well without immediate post procedural complication. FINDINGS: A total of approximately 6.9 L of clear yellow fluid was removed. Samples were sent to the laboratory as requested by the clinical team. IMPRESSION: Successful ultrasound-guided paracentesis yielding 6.9 liters of peritoneal fluid. Procedure performed by Alwyn Ren NP and supervised by Dr. Archer Asa. PLAN: If the patient eventually requires >/=2 paracenteses in a 30 day period, candidacy for formal evaluation by the [facility] Interventional Radiology Portal Hypertension Clinic will be assessed. Electronically Signed   By: Malachy Moan M.D.   On: 12/05/2022 15:25   DG Chest Port 1 View  Result Date: 12/03/2022 CLINICAL DATA:  Shortness of breath EXAM: PORTABLE CHEST 1 VIEW COMPARISON:  Chest x-ray 12/02/2021 FINDINGS: Bilateral  calcified nodules are again seen. There is no focal lung infiltrate, pleural effusion or pneumothorax. The cardiomediastinal silhouette is within normal limits. No acute fractures are seen. Orthopedic plate is seen in the right clavicle. IMPRESSION: 1. No active disease. 2. Bilateral calcified pulmonary nodules. Electronically Signed   By: Darliss Cheney M.D.   On: 12/03/2022 22:18   US Venous Img Lower Unilateral Right  Result Date: 11/15/2022 CLINICAL DATA:  Edema and swelling EXAM: RIGHT LOWER EXTREMITY VENOUS DOPPLER ULTRASOUND TECHNIQUE: Gray-scale sonography with graded compression, as well as color Doppler and duplex ultrasound were performed to evaluate the lower extremity deep venous systems from the level of the common femoral vein and including the common femoral, femoral, profunda femoral, popliteal and calf veins including the posterior tibial, peroneal and gastrocnemius veins when visible. Spectral Doppler was utilized to evaluate flow at rest and with distal augmentation maneuvers in the common femoral, femoral and popliteal veins. COMPARISON:  None Available. FINDINGS: Contralateral Common Femoral Vein: Respiratory phasicity is normal  and symmetric with the symptomatic side. No evidence of thrombus. Normal compressibility. Common Femoral Vein: No evidence of thrombus. Normal compressibility, respiratory phasicity and response to augmentation. Saphenofemoral Junction: No evidence of thrombus. Normal compressibility and flow on color Doppler imaging. Profunda Femoral Vein: No evidence of thrombus. Normal compressibility and flow on color Doppler imaging. Femoral Vein: No evidence of thrombus. Normal compressibility, respiratory phasicity and response to augmentation. Popliteal Vein: No evidence of thrombus. Normal compressibility, respiratory phasicity and response to augmentation. Calf Veins: No evidence of thrombus. Normal compressibility and flow on color Doppler imaging. IMPRESSION: No evidence  of deep venous thrombosis. Electronically Signed   By: Judie Petit.  Shick M.D.   On: 11/15/2022 18:39   CT Foot Right Wo Contrast  Result Date: 11/15/2022 CLINICAL DATA:  Foot swelling, diabetic, osteomyelitis suspected. Patient had right ankle fusion a proximally 3 weeks ago. EXAM: CT OF THE RIGHT ANKLE WITHOUT CONTRAST CT OF THE RIGHT FOOT WITHOUT CONTRAST TECHNIQUE: Multidetector CT imaging of the right ANKLE AND foot was performed according to the standard protocol. Multiplanar CT image reconstructions were also generated. RADIATION DOSE REDUCTION: This exam was performed according to the departmental dose-optimization program which includes automated exposure control, adjustment of the mA and/or kV according to patient size and/or use of iterative reconstruction technique. COMPARISON:  None Available. FINDINGS: Right ankle/foot: Bones/Joint/Cartilage There is marked periosteal reaction about the distal tibia and fibula and multiple anteroposterior screws across the distal tibia. There is ghosting artifact from the prior hardware in the posterior to mid talus body and calcaneus. Osseous fragmentation of the talus is noted posterior to the ghost track for prior hardware. There are 2 anteroposterior screws for arthrodesis of the talonavicular joint. There is however loosening about the screws in the talus as well as in the calcaneus suspicious for infectious/inflammatory process. There are degenerative changes of the talonavicular joint. Calcaneocuboid joint and tarsometatarsal joints are intact. Degenerative changes of the first metatarsophalangeal joint. Remaining distal foot joints appear intact. Ligaments Suboptimally assessed by CT. Muscles and Tendons Generalized muscle atrophy of the distal leg muscles. Achilles tendon is intact. Tendons of the flexor, extensor and peroneal compartments also appear maintained. Soft tissues There is marked skin thickening and subcutaneous soft tissue edema about the distal leg as  well as along the ankle/foot. No fluid collection or abscess on this noncontrast enhanced examination. IMPRESSION: 1. Marked periosteal reaction about the distal tibia and fibula and multiple anteroposterior screws across the distal tibia, likely sequela of chronic infectious/inflammatory process. 2. There are 2 anteroposterior screws for arthrodesis of the talonavicular joint. There is however loosening about the screws in the talus as well as in the calcaneus suspicious for infectious/inflammatory process. 3. Osseous fragmentation of the talus posterior to the ghost track for prior hardware. 4. Marked skin thickening and subcutaneous soft tissue edema about the distal leg as well as along the ankle/foot. No fluid collection or abscess on this noncontrast enhanced examination. 5. Generalized muscle atrophy of the distal leg muscles. 6. Degenerative changes of the first metatarsophalangeal joint. Electronically Signed   By: Larose Hires D.O.   On: 11/15/2022 17:03   CT Ankle Right Wo Contrast  Result Date: 11/15/2022 CLINICAL DATA:  Foot swelling, diabetic, osteomyelitis suspected. Patient had right ankle fusion a proximally 3 weeks ago. EXAM: CT OF THE RIGHT ANKLE WITHOUT CONTRAST CT OF THE RIGHT FOOT WITHOUT CONTRAST TECHNIQUE: Multidetector CT imaging of the right ANKLE AND foot was performed according to the standard protocol. Multiplanar CT image reconstructions  were also generated. RADIATION DOSE REDUCTION: This exam was performed according to the departmental dose-optimization program which includes automated exposure control, adjustment of the mA and/or kV according to patient size and/or use of iterative reconstruction technique. COMPARISON:  None Available. FINDINGS: Right ankle/foot: Bones/Joint/Cartilage There is marked periosteal reaction about the distal tibia and fibula and multiple anteroposterior screws across the distal tibia. There is ghosting artifact from the prior hardware in the posterior  to mid talus body and calcaneus. Osseous fragmentation of the talus is noted posterior to the ghost track for prior hardware. There are 2 anteroposterior screws for arthrodesis of the talonavicular joint. There is however loosening about the screws in the talus as well as in the calcaneus suspicious for infectious/inflammatory process. There are degenerative changes of the talonavicular joint. Calcaneocuboid joint and tarsometatarsal joints are intact. Degenerative changes of the first metatarsophalangeal joint. Remaining distal foot joints appear intact. Ligaments Suboptimally assessed by CT. Muscles and Tendons Generalized muscle atrophy of the distal leg muscles. Achilles tendon is intact. Tendons of the flexor, extensor and peroneal compartments also appear maintained. Soft tissues There is marked skin thickening and subcutaneous soft tissue edema about the distal leg as well as along the ankle/foot. No fluid collection or abscess on this noncontrast enhanced examination. IMPRESSION: 1. Marked periosteal reaction about the distal tibia and fibula and multiple anteroposterior screws across the distal tibia, likely sequela of chronic infectious/inflammatory process. 2. There are 2 anteroposterior screws for arthrodesis of the talonavicular joint. There is however loosening about the screws in the talus as well as in the calcaneus suspicious for infectious/inflammatory process. 3. Osseous fragmentation of the talus posterior to the ghost track for prior hardware. 4. Marked skin thickening and subcutaneous soft tissue edema about the distal leg as well as along the ankle/foot. No fluid collection or abscess on this noncontrast enhanced examination. 5. Generalized muscle atrophy of the distal leg muscles. 6. Degenerative changes of the first metatarsophalangeal joint. Electronically Signed   By: Larose Hires D.O.   On: 11/15/2022 17:03    Microbiology: Results for orders placed or performed during the hospital  encounter of 12/03/22  Body fluid culture w Gram Stain     Status: None (Preliminary result)   Collection Time: 12/05/22 10:35 AM   Specimen: Peritoneal Washings  Result Value Ref Range Status   Specimen Description   Final    PERITONEAL Performed at [facility], [address] [place], [place] [address]    Special Requests   Final    NONE Performed at [facility], [address] [place], [place] [address]    Gram Stain   Final    NO WBC SEEN NO ORGANISMS SEEN Performed at [facility], [address] [place], [place] [address]    Culture PENDING  Incomplete   Report Status PENDING  Incomplete    Labs: CBC: Recent Labs  Lab 12/03/22 2202 12/04/22 0417  WBC 7.3 6.9  NEUTROABS 4.4  --   HGB 11.1* 10.1*  HCT 33.7* 30.9*  MCV 85.5 86.6  PLT 215 179   Basic Metabolic Panel: Recent Labs  Lab 12/03/22 2202 12/04/22 0417 12/05/22 0405  NA 135 134* 133*  K 3.3* 3.3* 3.3*  CL 104 104 103  CO2 22 20* 22  GLUCOSE 97 94 114*  BUN 13 14 14   CREATININE 1.32* 1.32* 1.43*  CALCIUM 8.2* 8.1* 7.9*  MG  --  1.6* 1.7   Liver Function Tests: No results for  input(s): "AST", "ALT", "ALKPHOS", "BILITOT", "PROT", "ALBUMIN" in the last 168 hours. CBG: No results for input(s): "GLUCAP" in the last 168 hours.  Discharge time spent: greater than 30 minutes.  Signed: Pennie Banter, DO [facility] 12/05/2022

## 2022-12-05 NOTE — Telephone Encounter (Signed)
Patient returned our call. Shared provider's note. No further questions.

## 2022-12-05 NOTE — Telephone Encounter (Signed)
I spoke with Dr Denton Lank. They cannot order the 4 tablets to pharmacy due to pain contract  His rx is ready to fill tomorrow 6/18  But patient is DC today and needs med.  I sent 4 tablets Oxy IR 10mg  today hopefully can pick up this today  Saralyn Pilar, DO [facility] 12/05/2022, 2:32 PM

## 2022-12-05 NOTE — Telephone Encounter (Signed)
Copied from CRM [id number]. Topic: General - Other >> Dec 05, 2022 12:53 PM Ja-Kwan M wrote: Reason for CRM: Pt reports that Dr. Denton Lank wants to know if she sends in a 1 day Rx for pain medication will Dr. Kirtland Bouchard approve it. Pt stated he is being released from the ED and needs the Rx to be approved. Pt requests call back to advise.

## 2022-12-05 NOTE — Procedures (Signed)
PROCEDURE SUMMARY:  Successful ultrasound guided paracentesis from the right lower quadrant.  Yielded 6.9 L of clear yellow fluid.  No immediate complications.  The patient tolerated the procedure well.   Specimen sent for labs.  EBL < 2 mL  If the patient eventually requires >/=2 paracenteses in a 30 day period, screening evaluation by the [facility] Interventional Radiology Portal Hypertension Clinic will be assessed.  Alwyn Ren, [place] [phone] 12/05/2022, 1:05 PM

## 2022-12-05 NOTE — Assessment & Plan Note (Signed)
Right foot and ankle pain Right heel ulcer On oxycodone 10 mg QID outpatient. Currently out of medication at home.  Provided 1 day prescription and PCP made aware. Follow up with Podiatry Continue wound care

## 2022-12-06 LAB — BODY FLUID CULTURE W GRAM STAIN: Culture: NO GROWTH

## 2022-12-07 LAB — BODY FLUID CULTURE W GRAM STAIN

## 2022-12-08 ENCOUNTER — Telehealth (INDEPENDENT_AMBULATORY_CARE_PROVIDER_SITE_OTHER): Payer: Medicaid Other | Admitting: Family Medicine

## 2022-12-08 DIAGNOSIS — Z91199 Patient's noncompliance with other medical treatment and regimen due to unspecified reason: Secondary | ICD-10-CM

## 2022-12-08 NOTE — Progress Notes (Signed)
No show for virtual video mychart visit

## 2022-12-12 ENCOUNTER — Emergency Department
Admission: EM | Admit: 2022-12-12 | Discharge: 2022-12-12 | Disposition: A | Discharge status: Home / Self Care | Payer: Medicaid Other | Attending: Emergency Medicine | Admitting: Emergency Medicine

## 2022-12-12 ENCOUNTER — Other Ambulatory Visit: Payer: Medicaid Other

## 2022-12-12 ENCOUNTER — Other Ambulatory Visit: Payer: Self-pay

## 2022-12-12 ENCOUNTER — Encounter: Payer: Self-pay | Admitting: Emergency Medicine

## 2022-12-12 DIAGNOSIS — G8929 Other chronic pain: Secondary | ICD-10-CM | POA: Diagnosis not present

## 2022-12-12 DIAGNOSIS — I10 Essential (primary) hypertension: Secondary | ICD-10-CM | POA: Diagnosis not present

## 2022-12-12 DIAGNOSIS — Z515 Encounter for palliative care: Secondary | ICD-10-CM

## 2022-12-12 DIAGNOSIS — M79671 Pain in right foot: Secondary | ICD-10-CM | POA: Insufficient documentation

## 2022-12-12 DIAGNOSIS — R140 Abdominal distension (gaseous): Secondary | ICD-10-CM

## 2022-12-12 DIAGNOSIS — R609 Edema, unspecified: Secondary | ICD-10-CM | POA: Diagnosis not present

## 2022-12-12 DIAGNOSIS — M79673 Pain in unspecified foot: Secondary | ICD-10-CM | POA: Diagnosis not present

## 2022-12-12 DIAGNOSIS — T8484XA Pain due to internal orthopedic prosthetic devices, implants and grafts, initial encounter: Secondary | ICD-10-CM

## 2022-12-12 LAB — CBC
HCT: 32.8 % — ABNORMAL LOW (ref 39.0–52.0)
Hemoglobin: 10.7 g/dL — ABNORMAL LOW (ref 13.0–17.0)
MCH: 27.4 pg (ref 26.0–34.0)
MCHC: 32.6 g/dL (ref 30.0–36.0)
MCV: 84.1 fL (ref 80.0–100.0)
Platelets: 225 10*3/uL (ref 150–400)
RBC: 3.9 MIL/uL — ABNORMAL LOW (ref 4.22–5.81)
RDW: 16.9 % — ABNORMAL HIGH (ref 11.5–15.5)
WBC: 7.9 10*3/uL (ref 4.0–10.5)
nRBC: 0 % (ref 0.0–0.2)

## 2022-12-12 LAB — COMPREHENSIVE METABOLIC PANEL
ALT: 18 U/L (ref 0–44)
AST: 42 U/L — ABNORMAL HIGH (ref 15–41)
Albumin: 2.2 g/dL — ABNORMAL LOW (ref 3.5–5.0)
Alkaline Phosphatase: 60 U/L (ref 38–126)
Anion gap: 8 (ref 5–15)
BUN: 24 mg/dL — ABNORMAL HIGH (ref 6–20)
CO2: 23 mmol/L (ref 22–32)
Calcium: 8.6 mg/dL — ABNORMAL LOW (ref 8.9–10.3)
Chloride: 104 mmol/L (ref 98–111)
Creatinine, Ser: 1.53 mg/dL — ABNORMAL HIGH (ref 0.61–1.24)
GFR, Estimated: 52 mL/min — ABNORMAL LOW (ref >60)
Glucose, Bld: 76 mg/dL (ref 70–99)
Potassium: 3.9 mmol/L (ref 3.5–5.1)
Sodium: 135 mmol/L (ref 135–145)
Total Bilirubin: 1.7 mg/dL — ABNORMAL HIGH (ref 0.3–1.2)
Total Protein: 6 g/dL — ABNORMAL LOW (ref 6.5–8.1)

## 2022-12-12 LAB — LIPASE, BLOOD: Lipase: 22 U/L (ref 11–51)

## 2022-12-12 MED ORDER — OXYCODONE HCL 5 MG PO TABS
10.0000 mg | ORAL_TABLET | Freq: Once | ORAL | Status: AC
  Administered 2022-12-12: 10 mg via ORAL
  Filled 2022-12-12: qty 2

## 2022-12-12 MED ORDER — OXYCODONE HCL 10 MG PO TABS
10.0000 mg | ORAL_TABLET | Freq: Four times a day (QID) | ORAL | 0 refills | Status: DC | PRN
Start: 2022-12-12 — End: 2022-12-19

## 2022-12-12 NOTE — ED Triage Notes (Signed)
Pt here from home under palliative care for cirrhosis  , pt ran out of his pain medication. Pt has swelling all over. Pt requesting pain medication on arrival. Pt takes oxycodone 10 mg every 4 hours.

## 2022-12-12 NOTE — ED Provider Notes (Signed)
[facility] Provider Note   Event Date/Time   First MD Initiated Contact with Patient 12/12/22 1512     (approximate) History  Foot Pain and abdominal swelling  HPI Tom [name] is a 57 y.o. male with past medical history of end-stage liver disease on palliative care and receiving paracenteses and as needed who presents complaining of "overdoing my pain medicine".  Patient states that he gets 15 days of pain medicines at a time from his pain management clinic and decided to use more than was prescribed over the past few days meaning that he ran out yesterday.  Patient states that he has taken all of his other medications on time and as prescribed.  Patient is currently complaining of 10/10 pain to the right foot and ankle due to a previous orthopedic injuries/surgeries.  Patient also complains of abdominal distention that he states he "does not want to go to [facility] for anymore". ROS: Patient currently denies any vision changes, tinnitus, difficulty speaking, facial droop, sore throat, chest pain, shortness of breath, nausea/vomiting/diarrhea, dysuria, or weakness/numbness/paresthesias in any extremity   Physical Exam  Triage Vital Signs: ED Triage Vitals  Enc Vitals Group     BP      Pulse      Resp      Temp      Temp src      SpO2      Weight      Height      Head Circumference      Peak Flow      Pain Score      Pain Loc      Pain Edu?      Excl. in GC?    Most recent vital signs: Vitals:   12/12/22 1523 12/12/22 1943  BP:  (!) 152/79  Pulse:  90  Resp:  18  Temp: 98.5 F (36.9 C)   SpO2:  93%   General: Awake, oriented x4. CV:  Good peripheral perfusion.  Resp:  Normal effort.  Abd:  Abdominal distention with positive fluid wave Other:  Middle-aged obese Caucasian male laying in bed in no acute distress ED Results / Procedures / Treatments  Labs (all labs ordered are listed, but only abnormal results are displayed) Labs Reviewed   COMPREHENSIVE METABOLIC PANEL - Abnormal; Notable for the following components:      Result Value   BUN 24 (*)    Creatinine, Ser 1.53 (*)    Calcium 8.6 (*)    Total Protein 6.0 (*)    Albumin 2.2 (*)    AST 42 (*)    Total Bilirubin 1.7 (*)    GFR, Estimated 52 (*)    All other components within normal limits  CBC - Abnormal; Notable for the following components:   RBC 3.90 (*)    Hemoglobin 10.7 (*)    HCT 32.8 (*)    RDW 16.9 (*)    All other components within normal limits  LIPASE, BLOOD  URINALYSIS, ROUTINE W REFLEX MICROSCOPIC   EKG ED ECG REPORT I, Merwyn Katos, the attending physician, personally viewed and interpreted this ECG. Date: 12/12/2022 EKG Time: 1522 Rate: 94 Rhythm: normal sinus rhythm QRS Axis: normal Intervals: normal ST/T Wave abnormalities: normal Narrative Interpretation: no evidence of acute ischemia  PROCEDURES: Critical Care performed: No .1-3 Lead EKG Interpretation  Performed by: Merwyn Katos, MD Authorized by: Merwyn Katos, MD     Interpretation: normal  ECG rate:  71   ECG rate assessment: normal     Rhythm: sinus rhythm     Ectopy: none     Conduction: normal    MEDICATIONS ORDERED IN ED: Medications  oxyCODONE (Oxy IR/ROXICODONE) immediate release tablet 10 mg (10 mg Oral Given 12/12/22 1543)  oxyCODONE (Oxy IR/ROXICODONE) immediate release tablet 10 mg (10 mg Oral Given 12/12/22 1936)   IMPRESSION / MDM / ASSESSMENT AND PLAN / ED COURSE  I reviewed the triage vital signs and the nursing notes.                             The patient is on the cardiac monitor to evaluate for evidence of arrhythmia and/or significant heart rate changes. Patient's presentation is most consistent with acute presentation with potential threat to life or bodily function. 57 year old male with a past medical history of end-stage liver disease on palliative care who presents complaining of right lower extremity pain after running out of his  oxycodone today. Given history, exam and workup I have low suspicion for fracture, dislocation, significant ligamentous injury, septic arthritis, gout flare, new autoimmune arthropathy, or gonococcal arthropathy.  Interventions: 10 mg oxycodone p.o. Rx: Oxycodone Spoke to patient at length about discussions with his primary care doctor for a standing order for paracentesis in our hospitals as patient does not want to travel to [facility].  I explained to patient that if this paracentesis were ordered from the emergency department, it would not be filled in the outpatient setting patient expressed understanding that he will need to follow-up with his primary care provider for any further questions. Disposition: Discharge home with strict return precautions and instructions for prompt primary care follow up in the next week.   FINAL CLINICAL IMPRESSION(S) / ED DIAGNOSES   Final diagnoses:  Chronic foot pain, right  Abdominal distension   Rx / DC Orders   ED Discharge Orders          Ordered    Oxycodone HCl 10 MG TABS  4 times daily PRN        12/12/22 1856           Note:  This document was prepared using Dragon voice recognition software and may include unintentional dictation errors.   Merwyn Katos, MD 12/12/22 2059

## 2022-12-13 ENCOUNTER — Encounter: Payer: Self-pay | Admitting: Family Medicine

## 2022-12-15 ENCOUNTER — Inpatient Hospital Stay
Admission: EM | Admit: 2022-12-15 | Discharge: 2022-12-19 | Disposition: A | Discharge status: Home / Self Care | Payer: Medicaid Other | Attending: Internal Medicine | Admitting: Internal Medicine

## 2022-12-15 ENCOUNTER — Other Ambulatory Visit: Payer: Self-pay

## 2022-12-15 ENCOUNTER — Encounter: Payer: Self-pay | Admitting: Emergency Medicine

## 2022-12-15 ENCOUNTER — Telehealth: Payer: Self-pay | Admitting: Family Medicine

## 2022-12-15 ENCOUNTER — Emergency Department: Payer: Medicaid Other

## 2022-12-15 ENCOUNTER — Ambulatory Visit: Payer: Self-pay | Admitting: *Deleted

## 2022-12-15 DIAGNOSIS — R079 Chest pain, unspecified: Secondary | ICD-10-CM | POA: Diagnosis not present

## 2022-12-15 DIAGNOSIS — K7210 Chronic hepatic failure without coma: Secondary | ICD-10-CM | POA: Diagnosis not present

## 2022-12-15 DIAGNOSIS — Z7951 Long term (current) use of inhaled steroids: Secondary | ICD-10-CM

## 2022-12-15 DIAGNOSIS — K7460 Unspecified cirrhosis of liver: Principal | ICD-10-CM

## 2022-12-15 DIAGNOSIS — K219 Gastro-esophageal reflux disease without esophagitis: Secondary | ICD-10-CM | POA: Diagnosis present

## 2022-12-15 DIAGNOSIS — Z79899 Other long term (current) drug therapy: Secondary | ICD-10-CM

## 2022-12-15 DIAGNOSIS — I10 Essential (primary) hypertension: Secondary | ICD-10-CM | POA: Diagnosis present

## 2022-12-15 DIAGNOSIS — R601 Generalized edema: Secondary | ICD-10-CM | POA: Diagnosis not present

## 2022-12-15 DIAGNOSIS — J449 Chronic obstructive pulmonary disease, unspecified: Secondary | ICD-10-CM | POA: Diagnosis present

## 2022-12-15 DIAGNOSIS — M810 Age-related osteoporosis without current pathological fracture: Secondary | ICD-10-CM | POA: Diagnosis present

## 2022-12-15 DIAGNOSIS — R1900 Intra-abdominal and pelvic swelling, mass and lump, unspecified site: Secondary | ICD-10-CM | POA: Diagnosis not present

## 2022-12-15 DIAGNOSIS — Z981 Arthrodesis status: Secondary | ICD-10-CM

## 2022-12-15 DIAGNOSIS — T849XXA Unspecified complication of internal orthopedic prosthetic device, implant and graft, initial encounter: Secondary | ICD-10-CM | POA: Diagnosis present

## 2022-12-15 DIAGNOSIS — Z91199 Patient's noncompliance with other medical treatment and regimen due to unspecified reason: Secondary | ICD-10-CM

## 2022-12-15 DIAGNOSIS — Z87891 Personal history of nicotine dependence: Secondary | ICD-10-CM

## 2022-12-15 DIAGNOSIS — Z91030 Bee allergy status: Secondary | ICD-10-CM

## 2022-12-15 DIAGNOSIS — Z96651 Presence of right artificial knee joint: Secondary | ICD-10-CM | POA: Diagnosis present

## 2022-12-15 DIAGNOSIS — J4489 Other specified chronic obstructive pulmonary disease: Secondary | ICD-10-CM | POA: Diagnosis present

## 2022-12-15 DIAGNOSIS — R140 Abdominal distension (gaseous): Secondary | ICD-10-CM

## 2022-12-15 DIAGNOSIS — R0902 Hypoxemia: Secondary | ICD-10-CM | POA: Diagnosis not present

## 2022-12-15 DIAGNOSIS — T8484XA Pain due to internal orthopedic prosthetic devices, implants and grafts, initial encounter: Secondary | ICD-10-CM

## 2022-12-15 DIAGNOSIS — R918 Other nonspecific abnormal finding of lung field: Secondary | ICD-10-CM | POA: Diagnosis not present

## 2022-12-15 DIAGNOSIS — E871 Hypo-osmolality and hyponatremia: Secondary | ICD-10-CM | POA: Diagnosis present

## 2022-12-15 DIAGNOSIS — K7031 Alcoholic cirrhosis of liver with ascites: Principal | ICD-10-CM | POA: Diagnosis present

## 2022-12-15 DIAGNOSIS — M1990 Unspecified osteoarthritis, unspecified site: Secondary | ICD-10-CM | POA: Diagnosis present

## 2022-12-15 DIAGNOSIS — Z515 Encounter for palliative care: Secondary | ICD-10-CM

## 2022-12-15 DIAGNOSIS — R188 Other ascites: Secondary | ICD-10-CM | POA: Diagnosis not present

## 2022-12-15 DIAGNOSIS — R001 Bradycardia, unspecified: Secondary | ICD-10-CM | POA: Diagnosis not present

## 2022-12-15 DIAGNOSIS — M79671 Pain in right foot: Secondary | ICD-10-CM | POA: Diagnosis not present

## 2022-12-15 DIAGNOSIS — Z888 Allergy status to other drugs, medicaments and biological substances status: Secondary | ICD-10-CM

## 2022-12-15 DIAGNOSIS — R000 Tachycardia, unspecified: Secondary | ICD-10-CM | POA: Diagnosis not present

## 2022-12-15 DIAGNOSIS — M25571 Pain in right ankle and joints of right foot: Secondary | ICD-10-CM | POA: Diagnosis present

## 2022-12-15 DIAGNOSIS — R0602 Shortness of breath: Secondary | ICD-10-CM | POA: Diagnosis not present

## 2022-12-15 DIAGNOSIS — E785 Hyperlipidemia, unspecified: Secondary | ICD-10-CM | POA: Diagnosis present

## 2022-12-15 DIAGNOSIS — G8929 Other chronic pain: Secondary | ICD-10-CM | POA: Diagnosis present

## 2022-12-15 LAB — COMPREHENSIVE METABOLIC PANEL
ALT: 19 U/L (ref 0–44)
AST: 46 U/L — ABNORMAL HIGH (ref 15–41)
Albumin: 2.5 g/dL — ABNORMAL LOW (ref 3.5–5.0)
Alkaline Phosphatase: 66 U/L (ref 38–126)
Anion gap: 11 (ref 5–15)
BUN: 25 mg/dL — ABNORMAL HIGH (ref 6–20)
CO2: 21 mmol/L — ABNORMAL LOW (ref 22–32)
Calcium: 8.7 mg/dL — ABNORMAL LOW (ref 8.9–10.3)
Chloride: 100 mmol/L (ref 98–111)
Creatinine, Ser: 1.82 mg/dL — ABNORMAL HIGH (ref 0.61–1.24)
GFR, Estimated: 43 mL/min — ABNORMAL LOW (ref >60)
Glucose, Bld: 133 mg/dL — ABNORMAL HIGH (ref 70–99)
Potassium: 4.3 mmol/L (ref 3.5–5.1)
Sodium: 132 mmol/L — ABNORMAL LOW (ref 135–145)
Total Bilirubin: 2.7 mg/dL — ABNORMAL HIGH (ref 0.3–1.2)
Total Protein: 6.8 g/dL (ref 6.5–8.1)

## 2022-12-15 LAB — CBC WITH DIFFERENTIAL/PLATELET
Abs Immature Granulocytes: 0.02 10*3/uL (ref 0.00–0.07)
Basophils Absolute: 0.2 10*3/uL — ABNORMAL HIGH (ref 0.0–0.1)
Basophils Relative: 2 %
Eosinophils Absolute: 0.4 10*3/uL (ref 0.0–0.5)
Eosinophils Relative: 4 %
HCT: 36.5 % — ABNORMAL LOW (ref 39.0–52.0)
Hemoglobin: 12 g/dL — ABNORMAL LOW (ref 13.0–17.0)
Immature Granulocytes: 0 %
Lymphocytes Relative: 15 %
Lymphs Abs: 1.3 10*3/uL (ref 0.7–4.0)
MCH: 27.6 pg (ref 26.0–34.0)
MCHC: 32.9 g/dL (ref 30.0–36.0)
MCV: 84.1 fL (ref 80.0–100.0)
Monocytes Absolute: 1.3 10*3/uL — ABNORMAL HIGH (ref 0.1–1.0)
Monocytes Relative: 15 %
Neutro Abs: 5.4 10*3/uL (ref 1.7–7.7)
Neutrophils Relative %: 64 %
Platelets: 266 10*3/uL (ref 150–400)
RBC: 4.34 MIL/uL (ref 4.22–5.81)
RDW: 17.2 % — ABNORMAL HIGH (ref 11.5–15.5)
WBC: 8.6 10*3/uL (ref 4.0–10.5)
nRBC: 0 % (ref 0.0–0.2)

## 2022-12-15 LAB — BRAIN NATRIURETIC PEPTIDE: B Natriuretic Peptide: 137.5 pg/mL — ABNORMAL HIGH (ref 0.0–100.0)

## 2022-12-15 LAB — TROPONIN I (HIGH SENSITIVITY): Troponin I (High Sensitivity): 16 ng/L (ref <18)

## 2022-12-15 MED ORDER — HYDROXYZINE HCL 25 MG PO TABS
25.0000 mg | ORAL_TABLET | Freq: Three times a day (TID) | ORAL | Status: DC | PRN
  Administered 2022-12-17: 25 mg via ORAL
  Filled 2022-12-15 (×2): qty 1

## 2022-12-15 MED ORDER — ACETAMINOPHEN 325 MG PO TABS: 650.0000 mg | ORAL_TABLET | Freq: Four times a day (QID) | ORAL | Status: DC | PRN

## 2022-12-15 MED ORDER — MAGNESIUM OXIDE -MG SUPPLEMENT 400 (240 MG) MG PO TABS
800.0000 mg | ORAL_TABLET | Freq: Two times a day (BID) | ORAL | Status: DC
  Administered 2022-12-16 – 2022-12-19 (×7): 800 mg via ORAL
  Filled 2022-12-15 (×8): qty 2

## 2022-12-15 MED ORDER — EPINEPHRINE 0.3 MG/0.3ML IJ SOAJ: 0.3000 mg | INTRAMUSCULAR | Status: DC | PRN

## 2022-12-15 MED ORDER — ONDANSETRON HCL 4 MG/2ML IJ SOLN
4.0000 mg | Freq: Four times a day (QID) | INTRAMUSCULAR | Status: DC | PRN
  Administered 2022-12-18: 4 mg via INTRAVENOUS
  Filled 2022-12-15: qty 2

## 2022-12-15 MED ORDER — ADULT MULTIVITAMIN W/MINERALS CH
1.0000 | ORAL_TABLET | Freq: Every day | ORAL | Status: DC
  Administered 2022-12-16 – 2022-12-19 (×4): 1 via ORAL
  Filled 2022-12-15 (×4): qty 1

## 2022-12-15 MED ORDER — FUROSEMIDE 20 MG PO TABS
20.0000 mg | ORAL_TABLET | Freq: Every day | ORAL | Status: DC
  Administered 2022-12-16 – 2022-12-19 (×4): 20 mg via ORAL
  Filled 2022-12-15 (×4): qty 1

## 2022-12-15 MED ORDER — ONDANSETRON HCL 4 MG PO TABS: 4.0000 mg | ORAL_TABLET | Freq: Four times a day (QID) | ORAL | Status: DC | PRN

## 2022-12-15 MED ORDER — OXYCODONE HCL 5 MG PO TABS
10.0000 mg | ORAL_TABLET | ORAL | Status: AC
  Administered 2022-12-15: 10 mg via ORAL
  Filled 2022-12-15: qty 2

## 2022-12-15 MED ORDER — MOMETASONE FURO-FORMOTEROL FUM 100-5 MCG/ACT IN AERO
2.0000 | INHALATION_SPRAY | Freq: Two times a day (BID) | RESPIRATORY_TRACT | Status: DC
  Administered 2022-12-16 – 2022-12-19 (×7): 2 via RESPIRATORY_TRACT
  Filled 2022-12-15 (×3): qty 8.8

## 2022-12-15 MED ORDER — FAMOTIDINE 20 MG PO TABS
20.0000 mg | ORAL_TABLET | Freq: Every day | ORAL | Status: DC
  Administered 2022-12-16 – 2022-12-18 (×3): 20 mg via ORAL
  Filled 2022-12-15 (×3): qty 1

## 2022-12-15 MED ORDER — DICLOFENAC SODIUM 1 % EX GEL
4.0000 g | Freq: Four times a day (QID) | CUTANEOUS | Status: DC
  Filled 2022-12-15: qty 100

## 2022-12-15 MED ORDER — LACTULOSE 10 GM/15ML PO SOLN
10.0000 g | ORAL | Status: DC
  Administered 2022-12-16 – 2022-12-18 (×2): 10 g via ORAL
  Filled 2022-12-15 (×3): qty 30

## 2022-12-15 MED ORDER — ACETAMINOPHEN 650 MG RE SUPP: 650.0000 mg | Freq: Four times a day (QID) | RECTAL | Status: DC | PRN

## 2022-12-15 MED ORDER — FOLIC ACID 1 MG PO TABS
1.0000 mg | ORAL_TABLET | Freq: Every day | ORAL | Status: DC
  Administered 2022-12-16 – 2022-12-19 (×4): 1 mg via ORAL
  Filled 2022-12-15 (×4): qty 1

## 2022-12-15 MED ORDER — OXYCODONE HCL 5 MG PO TABS
10.0000 mg | ORAL_TABLET | Freq: Four times a day (QID) | ORAL | Status: DC | PRN
  Administered 2022-12-16 (×2): 10 mg via ORAL
  Filled 2022-12-15 (×2): qty 2

## 2022-12-15 MED ORDER — LIDOCAINE 5 % EX PTCH
1.0000 | MEDICATED_PATCH | Freq: Every day | CUTANEOUS | Status: DC
  Filled 2022-12-15 (×3): qty 1

## 2022-12-15 MED ORDER — ATORVASTATIN CALCIUM 20 MG PO TABS
40.0000 mg | ORAL_TABLET | Freq: Every day | ORAL | Status: DC
  Administered 2022-12-16 – 2022-12-19 (×4): 40 mg via ORAL
  Filled 2022-12-15 (×4): qty 2

## 2022-12-15 MED ORDER — MAGNESIUM HYDROXIDE 400 MG/5ML PO SUSP
30.0000 mL | Freq: Every day | ORAL | Status: DC | PRN
  Administered 2022-12-17: 30 mL via ORAL
  Filled 2022-12-15: qty 30

## 2022-12-15 MED ORDER — SPIRONOLACTONE 25 MG PO TABS
25.0000 mg | ORAL_TABLET | Freq: Every day | ORAL | Status: DC
  Administered 2022-12-16 – 2022-12-19 (×4): 25 mg via ORAL
  Filled 2022-12-15 (×4): qty 1

## 2022-12-15 MED ORDER — ENOXAPARIN SODIUM 60 MG/0.6ML IJ SOSY
60.0000 mg | PREFILLED_SYRINGE | INTRAMUSCULAR | Status: DC
  Administered 2022-12-16 – 2022-12-19 (×4): 60 mg via SUBCUTANEOUS
  Filled 2022-12-15 (×4): qty 0.6

## 2022-12-15 MED ORDER — ALBUTEROL SULFATE (2.5 MG/3ML) 0.083% IN NEBU: 2.5000 mg | INHALATION_SOLUTION | RESPIRATORY_TRACT | Status: DC | PRN

## 2022-12-15 MED ORDER — ONDANSETRON 4 MG PO TBDP: 4.0000 mg | ORAL_TABLET | Freq: Three times a day (TID) | ORAL | Status: DC | PRN

## 2022-12-15 MED ORDER — TRAZODONE HCL 50 MG PO TABS
25.0000 mg | ORAL_TABLET | Freq: Every evening | ORAL | Status: DC | PRN
  Administered 2022-12-16 – 2022-12-18 (×3): 25 mg via ORAL
  Filled 2022-12-15 (×3): qty 1

## 2022-12-15 MED ORDER — PANTOPRAZOLE SODIUM 40 MG PO TBEC
40.0000 mg | DELAYED_RELEASE_TABLET | Freq: Every day | ORAL | Status: DC
  Administered 2022-12-16 – 2022-12-19 (×4): 40 mg via ORAL
  Filled 2022-12-15 (×4): qty 1

## 2022-12-15 NOTE — Assessment & Plan Note (Addendum)
Significant abdominal distension, clearly symptomatic and is associated with anasarca due to advanced liver disease. --IR consulted for paracentesis --Last para on 6/17 with 6.9 L removed, cell counts ruled out SBP --Low suspicion for infection at this time, monitor closely

## 2022-12-15 NOTE — ED Triage Notes (Signed)
Patient reports abdominal and right leg swelling "that started today."  Patient endorses shob d/t the abdominal swelling.  Patient reports he had fluid drawn off his abdomen 2 weeks ago.  Patient also requesting refill for pain medication because he is out.

## 2022-12-15 NOTE — Telephone Encounter (Signed)
Summary: Severe foot pain   The patient called in severe pain in his foot. He states he wants to cut his foot off. He also states the pharmacy will not fill his script as he got it from an ER provider and he was taking too many due to the pain. Please assist patient further. A HP TE was also left for the provider.       Chief Complaint: requesting enough oxycodone until refill on July 1. Right foot pain  Symptoms: right foot and leg severe pain. Difficulty walking on right leg. Purple in color per patient and went to ED today and reports was treated poorly. Right leg swelling to knee. Reports wants to "cut off foot". Has been taken more than prescribed medication due to severe pain and unable to walk to bathroom at night unless he takes more oxycodone than prescribed. Frequency: na  Pertinent Negatives: Patient denies chest pain no difficulty breathing no fever.  Disposition: [] ED /[] Urgent Care (no appt availability in office) / [] Appointment(In office/virtual)/ []  [place] [facility]/ [] Home Care/ [] Refused Recommended Disposition /[] [place] [facility]/ [x]  Follow-up with PCP Additional Notes:   Recommended if pain persists go to ED/ foot purple in color. Patient requesting enough oxycodone prescribed until his regular Rx can be refilled July 1. Patient reports pharmacy will not fill ED rx due to already taking pain medication . Please advise.      Reason for Disposition  [1] SEVERE pain (e.g., excruciating, unable to do any normal activities) AND [2] not improved after 2 hours of pain medicine  Answer Assessment - Initial Assessment Questions 1. ONSET: "When did the pain start?"      On going and went to ED today for pain management and was discharged pharmacy will not fill prescribed Rx due to already to receive oxycodone refill 12/19/22 2. LOCATION: "Where is the pain located?"      Right foot and leg 3. PAIN: "How bad is the pain?"    (Scale 1-10; or mild, moderate, severe)   -   MILD (1-3): doesn't interfere with normal activities    -  MODERATE (4-7): interferes with normal activities (e.g., work or school) or awakens from sleep, limping    -  SEVERE (8-10): excruciating pain, unable to do any normal activities, unable to walk     Severe, difficulty walking  4. WORK OR EXERCISE: "Has there been any recent work or exercise that involved this part of the body?"      Na  5. CAUSE: "What do you think is causing the leg pain?"     Na  6. OTHER SYMPTOMS: "Do you have any other symptoms?" (e.g., chest pain, back pain, breathing difficulty, swelling, rash, fever, numbness, weakness)     Right foot color purple. Severe pain swelling up to knee.  7. PREGNANCY: "Is there any chance you are pregnant?" "When was your last menstrual period?"     na  Protocols used: Leg Pain-A-AH

## 2022-12-15 NOTE — ED Notes (Signed)
First Nurse Note: Patient to ED via [facility] from home. C/o SOB with ascites- has not had paracentesis in 2 weeks. Pitting edema in bilateral lower extremities. VS WNL with EMS.

## 2022-12-15 NOTE — H&P (Addendum)
[place]   PATIENT NAME: Tom [name]    MR#:  [id number]  DATE OF BIRTH:  18-May-1966  DATE OF ADMISSION:  12/15/2022  PRIMARY CARE PHYSICIAN: [name], DO   Patient is coming from: Home  REQUESTING/REFERRING PHYSICIAN: Alfonse Flavors, MD  CHIEF COMPLAINT:   Chief Complaint  Patient presents with   Leg Swelling   Ascites   Shortness of Breath    HISTORY OF PRESENT ILLNESS:  Tom [name] is a 57 y.o. 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  He has been having worsening abdominal distention and mild associated dyspnea with  worsening bilateral lower extremity edema.  No reported fever or chills.  No nausea or vomiting or diarrhea.  No melena or bright red bleeding per rectum.  No chest pain or palpitations.  No cough or wheezing or hemoptysis.  ED Course: When the patient came to the ER, BP was 145/84 with otherwise normal vital signs.  BMP revealed mild hyponatremia 132 and  creatinine of 1.82 and BUN 25.  Albumin was 2.5 and total protein 6.8.  AST 46.  High-sensitivity troponin I was 16 and BNP 137.5.  CBC showed hemoglobin of 12 hematocrit 36.5 better than 3 days ago. EKG as reviewed by me : Normal sinus rhythm with a rate of 96 with low voltage QRS and poor R wave progression. Imaging: Chest x-ray showed no acute cardiopulmonary disease.  The patient was given 10 mg of p.o. oxycodone.  He will be admitted to an observation medical bed for further evaluation and management. PAST MEDICAL HISTORY:   Past Medical History:  Diagnosis Date   Arthritis    Asthma    COPD (chronic obstructive pulmonary disease) (HCC)    Hyperlipidemia    Hypertension    Hypokalemia  12/04/2022   Hypomagnesemia 12/04/2022   Osteoporosis     PAST SURGICAL HISTORY:   Past Surgical History:  Procedure Laterality Date   ANKLE FUSION Right 2017   BACK SURGERY     TOTAL KNEE ARTHROPLASTY Right 04/2012    SOCIAL HISTORY:   Social History   Tobacco Use   Smoking status: Former    Packs/day: 1.00    Years: 39.00    Additional pack years: 0.00    Total pack years: 39.00    Types: Cigarettes    Quit date: 12/2015    Years since quitting: 6.9   Smokeless tobacco: Never  Substance Use Topics   Alcohol use: No    FAMILY HISTORY:  Positive for lymphoma in his mother chronic kidney disease in his father and cancer in his brother.  DRUG ALLERGIES:   Allergies  Allergen Reactions   Alfuzosin Other (See Comments)    Dizziness    Amlodipine Nausea And Vomiting   Bee Venom Swelling   Cymbalta [Duloxetine Hcl] Other (See Comments)    Emesis    Duloxetine Other (See Comments)    Emesis   Lisinopril    Flomax [Tamsulosin Hcl] Itching    REVIEW OF SYSTEMS:   ROS As per history of present illness. All pertinent systems were reviewed above. Constitutional, HEENT, cardiovascular, respiratory, GI, GU, musculoskeletal, neuro, psychiatric, endocrine, integumentary and hematologic systems were reviewed and are otherwise negative/unremarkable except for positive findings mentioned above  in the HPI.   MEDICATIONS AT HOME:   Prior to Admission medications   Medication Sig Start Date End Date Taking? Authorizing Provider  albuterol (PROVENTIL) (2.5 MG/3ML) 0.083% nebulizer solution Take 3 mLs (2.5 mg total) by nebulization every 4 (four) hours as needed for wheezing or shortness of breath. 11/16/22  Yes Sreenath, Sudheer B, MD  atorvastatin (LIPITOR) 40 MG tablet Take 1 tablet (40 mg total) by mouth daily. 03/21/22  Yes [name], [name], DO  budesonide-formoterol (SYMBICORT) 80-4.5 MCG/ACT inhaler Take 2 puffs first thing in am and then another 2 puffs about 12  hours later. 03/21/22  Yes [name], [name], DO  diclofenac Sodium (VOLTAREN) 1 % GEL Apply 4 g topically 4 (four) times daily. 11/16/22  Yes Sreenath, Jonelle Sports, MD  Docusate Sodium (DSS) 100 MG CAPS TAKE 1 CAPSULE (100 MG TOTAL) BY MOUTH TWO (2) TIMES A DAY   Yes [provider]  EPINEPHrine (EPIPEN 2-PAK) 0.3 mg/0.3 mL IJ SOAJ injection Inject 0.3 mg into the muscle as needed for anaphylaxis. 12/16/21  Yes [name], [name], DO  folic acid (FOLVITE) 1 MG tablet Take 1 tablet (1 mg total) by mouth daily. 11/03/22  Yes [name], [name], DO  furosemide (LASIX) 20 MG tablet Take 1 tablet (20 mg total) by mouth daily. 10/20/22  Yes [name], [name], DO  Homeopathic Products (LIVER SUPPORT SL) Place under the tongue.   Yes [provider]  hydrOXYzine (VISTARIL) 25 MG capsule Take 1 capsule (25 mg total) by mouth every 8 (eight) hours as needed. 10/10/22  Yes [name], [name], DO  lactulose (CHRONULAC) 10 GM/15ML solution SMARTSIG:15 Milliliter(s) By Mouth Every Other Day 10/20/22  Yes [name], [name], DO  lidocaine (LIDODERM) 5 % Place 1 patch onto the skin daily.   Yes [provider]  magnesium oxide (MAG-OX) 400 (240 Mg) MG tablet Take 2 tablets by mouth 2 (two) times daily. 09/14/22  Yes [provider]  Multiple Vitamin (MULTIVITAMIN) capsule Take 1 capsule by mouth daily.   Yes [provider]  ondansetron (ZOFRAN-ODT) 4 MG disintegrating tablet Take 1 tablet (4 mg total) by mouth every 8 (eight) hours as needed for nausea or vomiting. 10/10/22  Yes [name], [name], DO  Oxycodone HCl 10 MG TABS Take 1 tablet (10 mg total) by mouth 4 (four) times daily as needed for up to 5 days (modereate to severe pain). 12/12/22 12/17/22 Yes Merwyn Katos, MD  pantoprazole (PROTONIX) 40 MG tablet Take 1 tablet (40 mg total) by mouth daily before breakfast. 06/01/22  Yes [name], [name], DO  spironolactone  (ALDACTONE) 25 MG tablet Take 1 tablet (25 mg total) by mouth daily. 10/20/22  Yes [name], [name], DO  famotidine (PEPCID) 20 MG tablet Take 1 tablet (20 mg total) by mouth daily after supper. One after supper 03/21/22   [name], DO      VITAL SIGNS:  Blood pressure (!) 151/94, pulse 92, temperature 98.2 F (36.8 C), temperature source Oral, resp. rate 17, height 6' (1.829 m), weight 117 kg, SpO2 98 %.  PHYSICAL EXAMINATION:  Physical Exam  GENERAL:  57 y.o.-year-old male patient lying in the bed with no acute distress.  EYES: Pupils equal, round, reactive to light and accommodation. No scleral icterus. Extraocular muscles intact.  HEENT: Head atraumatic, normocephalic. Oropharynx and nasopharynx clear.  NECK:  Supple, no jugular venous distention. No thyroid enlargement, no tenderness.  LUNGS: Normal breath sounds bilaterally, no wheezing, rales,rhonchi or  crepitation. No use of accessory muscles of respiration.  CARDIOVASCULAR: Regular rate and rhythm, S1, S2 normal. No murmurs, rubs, or gallops.  ABDOMEN: Soft, nontender with significant distention and positive shifting dullness. Bowel sounds present. No organomegaly or mass.  EXTREMITIES: 3-4+ bilateral lower extremity pitting edema more on the right than the left there is chronic with no clubbing or cyanosis.Marland Kitchen  NEUROLOGIC: Cranial nerves II through XII are intact. Muscle strength 5/5 in all extremities. Sensation intact. Gait not checked.  PSYCHIATRIC: The patient is alert and oriented x 3.  Normal affect and good eye contact. SKIN: No obvious rash, lesion, or ulcer.   LABORATORY PANEL:   CBC Recent Labs  Lab 12/15/22 1925  WBC 8.6  HGB 12.0*  HCT 36.5*  PLT 266   ------------------------------------------------------------------------------------------------------------------  Chemistries  Recent Labs  Lab 12/15/22 1925  NA 132*  K 4.3  CL 100  CO2 21*  GLUCOSE 133*  BUN 25*  CREATININE  1.82*  CALCIUM 8.7*  AST 46*  ALT 19  ALKPHOS 66  BILITOT 2.7*   ------------------------------------------------------------------------------------------------------------------  Cardiac Enzymes No results for input(s): "TROPONINI" in the last 168 hours. ------------------------------------------------------------------------------------------------------------------  RADIOLOGY:  DG Chest 2 View  Result Date: 12/15/2022 CLINICAL DATA:  Shortness of breath EXAM: CHEST - 2 VIEW COMPARISON:  12/03/2022 FINDINGS: Unchanged cardiac and mediastinal contours. Redemonstrated bilateral calcified nodules. No new focal pulmonary opacity. No pleural effusion or pneumothorax. No acute osseous abnormality. Redemonstrated plate and screw fixation of the mid to distal right clavicle. IMPRESSION: No active cardiopulmonary disease. Electronically Signed   By: Wiliam Ke M.D.   On: 12/15/2022 19:50      IMPRESSION AND PLAN:  Assessment and Plan: * Ascites - This is clearly symptomatic and is associated with anasarca due to advanced liver disease.. - The patient will be admitted to an observation medical bed. - We will obtain an IR consult for paracentesis in AM that is mainly therapeutic.  Labs are ordered.  Essential hypertension - We will continue his antihypertensives.  End stage liver disease (HCC) - We will continue Aldactone and rifaximin.  Dyslipidemia - We will continue statin therapy.  GERD without esophagitis - We will continue PPI therapy.  COPD with asthma - We will continue his inhalers.   DVT prophylaxis: Lovenox.  Advanced Care Planning:  Code Status: full code.  Family Communication:  The plan of care was discussed in details with the patient (and family). I answered all questions. The patient agreed to proceed with the above mentioned plan. Further management will depend upon hospital course. Disposition Plan: Back to previous home environment Consults called: none.   All the records are reviewed and case discussed with ED provider.  Status is: Observation  I certify that at the time of admission, it is my clinical judgment that the patient will require inpatient hospital care extending more than 2 midnights.                            Dispo: The patient is from: Home              Anticipated d/c is to: Home              Patient currently is not medically stable to d/c.              Difficult to place patient: No  Hannah Beat M.D on 12/16/2022 at 12:20 AM  [facility]   From 7  PM-7 AM, contact night-coverage [email or web address]  CC: Primary care physician; [name], DO

## 2022-12-15 NOTE — Assessment & Plan Note (Addendum)
-   Continue home regimen 

## 2022-12-15 NOTE — Assessment & Plan Note (Addendum)
Continue statin. 

## 2022-12-15 NOTE — ED Provider Notes (Signed)
[facility] Provider Note    Event Date/Time   First MD Initiated Contact with Patient 12/15/22 2043     (approximate)   History   Chief Complaint: Leg Swelling, Ascites, and Shortness of Breath   HPI  Tom [name] is a 57 y.o. male with a history of alcoholic cirrhosis, COPD, hypertension who comes ED complaining of worsening abdominal and leg swelling over the past several days.  Last paracentesis was 2 weeks ago, does not have outpatient follow-up with GI.  Reports that over the last 24 hours he is becoming symptomatic and not able to ambulate, having trouble breathing when lying down.  Denies fever.     Physical Exam   Triage Vital Signs: ED Triage Vitals  Enc Vitals Group     BP 12/15/22 1908 (!) 122/98     Pulse Rate 12/15/22 1908 97     Resp 12/15/22 1908 18     Temp 12/15/22 1908 98.2 F (36.8 C)     Temp Source 12/15/22 1908 Oral     SpO2 12/15/22 1908 97 %     Weight 12/15/22 1909 257 lb 15 oz (117 kg)     Height 12/15/22 1909 6' (1.829 m)     Head Circumference --      Peak Flow --      Pain Score 12/15/22 1909 10     Pain Loc --      Pain Edu? --      Excl. in GC? --     Most recent vital signs: Vitals:   12/15/22 2042 12/15/22 2200  BP:    Pulse: 88 92  Resp: 16 17  Temp:    SpO2: 100% 98%    General: Awake, no distress.  CV:  Good peripheral perfusion.  Regular rate rhythm Resp:  Normal effort.  Clear to auscultation bilaterally Abd:  Severely distended.  Not peritoneal.  There is abdominal wall edema. Other:  2+ pitting edema bilateral lower extremities.  Right leg more swollen than left which is chronic per patient.  No leg pain.   ED Results / Procedures / Treatments   Labs (all labs ordered are listed, but only abnormal results are displayed) Labs Reviewed  CBC WITH DIFFERENTIAL/PLATELET - Abnormal; Notable for the following components:      Result Value   Hemoglobin 12.0 (*)    HCT 36.5 (*)     RDW 17.2 (*)    Monocytes Absolute 1.3 (*)    Basophils Absolute 0.2 (*)    All other components within normal limits  COMPREHENSIVE METABOLIC PANEL - Abnormal; Notable for the following components:   Sodium 132 (*)    CO2 21 (*)    Glucose, Bld 133 (*)    BUN 25 (*)    Creatinine, Ser 1.82 (*)    Calcium 8.7 (*)    Albumin 2.5 (*)    AST 46 (*)    Total Bilirubin 2.7 (*)    GFR, Estimated 43 (*)    All other components within normal limits  BRAIN NATRIURETIC PEPTIDE - Abnormal; Notable for the following components:   B Natriuretic Peptide 137.5 (*)    All other components within normal limits  CBC  COMPREHENSIVE METABOLIC PANEL  TROPONIN I (HIGH SENSITIVITY)  TROPONIN I (HIGH SENSITIVITY)     EKG    RADIOLOGY Chest x-ray interpreted by me, negative for edema.  Negative for pneumonia.  Radiology report reviewed   PROCEDURES:  Procedures  MEDICATIONS ORDERED IN ED: Medications  oxyCODONE (Oxy IR/ROXICODONE) immediate release tablet 10 mg (has no administration in time range)  atorvastatin (LIPITOR) tablet 40 mg (has no administration in time range)  EPINEPHrine (EPI-PEN) injection 0.3 mg (has no administration in time range)  furosemide (LASIX) tablet 20 mg (has no administration in time range)  spironolactone (ALDACTONE) tablet 25 mg (has no administration in time range)  hydrOXYzine (ATARAX) tablet 25 mg (has no administration in time range)  famotidine (PEPCID) tablet 20 mg (has no administration in time range)  lactulose (CHRONULAC) 10 GM/15ML solution 10 g (has no administration in time range)  pantoprazole (PROTONIX) EC tablet 40 mg (has no administration in time range)  folic acid (FOLVITE) tablet 1 mg (has no administration in time range)  magnesium oxide (MAG-OX) tablet 800 mg (has no administration in time range)  multivitamin with minerals tablet 1 tablet (has no administration in time range)  albuterol (PROVENTIL) (2.5 MG/3ML) 0.083% nebulizer solution  2.5 mg (has no administration in time range)  mometasone-formoterol (DULERA) 100-5 MCG/ACT inhaler 2 puff (has no administration in time range)  diclofenac Sodium (VOLTAREN) 1 % topical gel 4 g (has no administration in time range)  lidocaine (LIDODERM) 5 % 1 patch (has no administration in time range)  enoxaparin (LOVENOX) injection 60 mg (has no administration in time range)  acetaminophen (TYLENOL) tablet 650 mg (has no administration in time range)    Or  acetaminophen (TYLENOL) suppository 650 mg (has no administration in time range)  traZODone (DESYREL) tablet 25 mg (has no administration in time range)  magnesium hydroxide (MILK OF MAGNESIA) suspension 30 mL (has no administration in time range)  ondansetron (ZOFRAN) tablet 4 mg (has no administration in time range)    Or  ondansetron (ZOFRAN) injection 4 mg (has no administration in time range)  oxyCODONE (Oxy IR/ROXICODONE) immediate release tablet 10 mg (10 mg Oral Given 12/15/22 2207)     IMPRESSION / MDM / ASSESSMENT AND PLAN / ED COURSE  I reviewed the triage vital signs and the nursing notes.  DDx: Symptomatic ascites, hypoalbuminemia, AKI, electrolyte abnormality, non-STEMI, CHF, anemia  Patient's presentation is most consistent with acute presentation with potential threat to life or bodily function.  Patient presents with symptoms of shortness of breath and severe abdominal swelling.  Clinically he has symptoms of anasarca and volume overload with edema of the lower extremities and abdominal wall along with large ascites.  Albumin level is 2.5.  He will need large-volume paracentesis with albumin infusions.  Doubt peritonitis.  Discussed with hospitalist for further management.       FINAL CLINICAL IMPRESSION(S) / ED DIAGNOSES   Final diagnoses:  Cirrhosis of liver with ascites, unspecified hepatic cirrhosis type (HCC)  Anasarca     Rx / DC Orders   ED Discharge Orders     None        Note:  This  document was prepared using Dragon voice recognition software and may include unintentional dictation errors.   Sharman Cheek, MD 12/15/22 [phone]

## 2022-12-15 NOTE — Assessment & Plan Note (Addendum)
Continue PPI ?

## 2022-12-15 NOTE — Assessment & Plan Note (Addendum)
--  Continue Lasix, Aldactone  --Does not appear to be on Xifaxan --Palliative care consult for GOC / code status, symptom mgmt --Pt wants to pursue transplant at [facility] - will likely need referral. Does not wish to return to [facility]

## 2022-12-15 NOTE — Telephone Encounter (Signed)
The patient states he cannot get his pain meds from the pharmacy because he was taking too many due to the pain. He was prescribed this last by an ER doctor but his insurance will not approve it because it is not from his primary care provider. He doesn't know what else to do. States he just wants to cut his foot off. I did send a [facility] clinical call to our triage nurses. Please assist patient further

## 2022-12-15 NOTE — Assessment & Plan Note (Addendum)
Stable, no exacerbated. --Continue home inhalers

## 2022-12-15 NOTE — Progress Notes (Signed)
PHARMACIST - PHYSICIAN COMMUNICATION  CONCERNING:  Enoxaparin (Lovenox) for DVT Prophylaxis    RECOMMENDATION: Patient was prescribed enoxaprin 40mg  q24 hours for VTE prophylaxis.   Filed Weights   12/15/22 1909  Weight: 117 kg (257 lb 15 oz)    Body mass index is 34.98 kg/m.  Estimated Creatinine Clearance: 58.4 mL/min (A) (by C-G formula based on SCr of 1.82 mg/dL (H)).   Based on [facility] policy patient is candidate for enoxaparin 0.5mg /kg TBW SQ every 24 hours based on BMI being >30.  DESCRIPTION: Pharmacy has adjusted enoxaparin dose per [facility] policy.  Patient is now receiving enoxaparin 0.5 mg/kg every 24 hours   Otelia Sergeant, PharmD, [facility] 12/15/2022 10:44 PM

## 2022-12-16 ENCOUNTER — Observation Stay: Payer: Medicaid Other

## 2022-12-16 ENCOUNTER — Encounter: Payer: Self-pay | Admitting: Family Medicine

## 2022-12-16 DIAGNOSIS — K7031 Alcoholic cirrhosis of liver with ascites: Secondary | ICD-10-CM | POA: Diagnosis not present

## 2022-12-16 DIAGNOSIS — R0602 Shortness of breath: Secondary | ICD-10-CM | POA: Diagnosis not present

## 2022-12-16 DIAGNOSIS — R188 Other ascites: Secondary | ICD-10-CM | POA: Diagnosis not present

## 2022-12-16 DIAGNOSIS — M7989 Other specified soft tissue disorders: Secondary | ICD-10-CM | POA: Diagnosis not present

## 2022-12-16 DIAGNOSIS — Z87891 Personal history of nicotine dependence: Secondary | ICD-10-CM | POA: Diagnosis not present

## 2022-12-16 DIAGNOSIS — Z888 Allergy status to other drugs, medicaments and biological substances status: Secondary | ICD-10-CM | POA: Diagnosis not present

## 2022-12-16 DIAGNOSIS — Z96651 Presence of right artificial knee joint: Secondary | ICD-10-CM | POA: Diagnosis present

## 2022-12-16 DIAGNOSIS — K7210 Chronic hepatic failure without coma: Secondary | ICD-10-CM | POA: Diagnosis not present

## 2022-12-16 DIAGNOSIS — Z981 Arthrodesis status: Secondary | ICD-10-CM | POA: Diagnosis not present

## 2022-12-16 DIAGNOSIS — K7460 Unspecified cirrhosis of liver: Secondary | ICD-10-CM

## 2022-12-16 DIAGNOSIS — R079 Chest pain, unspecified: Secondary | ICD-10-CM | POA: Diagnosis not present

## 2022-12-16 DIAGNOSIS — J4489 Other specified chronic obstructive pulmonary disease: Secondary | ICD-10-CM | POA: Diagnosis present

## 2022-12-16 DIAGNOSIS — T849XXA Unspecified complication of internal orthopedic prosthetic device, implant and graft, initial encounter: Secondary | ICD-10-CM | POA: Diagnosis not present

## 2022-12-16 DIAGNOSIS — E871 Hypo-osmolality and hyponatremia: Secondary | ICD-10-CM | POA: Diagnosis not present

## 2022-12-16 DIAGNOSIS — G8929 Other chronic pain: Secondary | ICD-10-CM | POA: Diagnosis present

## 2022-12-16 DIAGNOSIS — R601 Generalized edema: Secondary | ICD-10-CM | POA: Diagnosis not present

## 2022-12-16 DIAGNOSIS — M25571 Pain in right ankle and joints of right foot: Secondary | ICD-10-CM | POA: Diagnosis present

## 2022-12-16 DIAGNOSIS — K219 Gastro-esophageal reflux disease without esophagitis: Secondary | ICD-10-CM | POA: Diagnosis present

## 2022-12-16 DIAGNOSIS — Z91199 Patient's noncompliance with other medical treatment and regimen due to unspecified reason: Secondary | ICD-10-CM | POA: Diagnosis not present

## 2022-12-16 DIAGNOSIS — M79671 Pain in right foot: Secondary | ICD-10-CM

## 2022-12-16 DIAGNOSIS — R140 Abdominal distension (gaseous): Secondary | ICD-10-CM | POA: Diagnosis not present

## 2022-12-16 DIAGNOSIS — I10 Essential (primary) hypertension: Secondary | ICD-10-CM | POA: Diagnosis present

## 2022-12-16 DIAGNOSIS — Z7951 Long term (current) use of inhaled steroids: Secondary | ICD-10-CM | POA: Diagnosis not present

## 2022-12-16 DIAGNOSIS — M1990 Unspecified osteoarthritis, unspecified site: Secondary | ICD-10-CM | POA: Diagnosis present

## 2022-12-16 DIAGNOSIS — M810 Age-related osteoporosis without current pathological fracture: Secondary | ICD-10-CM | POA: Diagnosis present

## 2022-12-16 DIAGNOSIS — E785 Hyperlipidemia, unspecified: Secondary | ICD-10-CM | POA: Diagnosis present

## 2022-12-16 DIAGNOSIS — Z515 Encounter for palliative care: Secondary | ICD-10-CM

## 2022-12-16 DIAGNOSIS — Z91030 Bee allergy status: Secondary | ICD-10-CM | POA: Diagnosis not present

## 2022-12-16 DIAGNOSIS — R918 Other nonspecific abnormal finding of lung field: Secondary | ICD-10-CM | POA: Diagnosis not present

## 2022-12-16 DIAGNOSIS — Z79899 Other long term (current) drug therapy: Secondary | ICD-10-CM | POA: Diagnosis not present

## 2022-12-16 LAB — CBC
HCT: 30.8 % — ABNORMAL LOW (ref 39.0–52.0)
Hemoglobin: 10.2 g/dL — ABNORMAL LOW (ref 13.0–17.0)
MCH: 27.5 pg (ref 26.0–34.0)
MCHC: 33.1 g/dL (ref 30.0–36.0)
MCV: 83 fL (ref 80.0–100.0)
Platelets: 210 10*3/uL (ref 150–400)
RBC: 3.71 MIL/uL — ABNORMAL LOW (ref 4.22–5.81)
RDW: 17 % — ABNORMAL HIGH (ref 11.5–15.5)
WBC: 8.1 10*3/uL (ref 4.0–10.5)
nRBC: 0 % (ref 0.0–0.2)

## 2022-12-16 LAB — PROTEIN, PLEURAL OR PERITONEAL FLUID: Total protein, fluid: <3 g/dL

## 2022-12-16 LAB — BODY FLUID CELL COUNT WITH DIFFERENTIAL
Eos, Fluid: 0 %
Lymphs, Fluid: 41 %
Monocyte-Macrophage-Serous Fluid: 58 %
Neutrophil Count, Fluid: 0 %
Total Nucleated Cell Count, Fluid: 201 cu mm

## 2022-12-16 LAB — COMPREHENSIVE METABOLIC PANEL
ALT: 17 U/L (ref 0–44)
AST: 37 U/L (ref 15–41)
Albumin: 2.2 g/dL — ABNORMAL LOW (ref 3.5–5.0)
Alkaline Phosphatase: 56 U/L (ref 38–126)
Anion gap: 9 (ref 5–15)
BUN: 23 mg/dL — ABNORMAL HIGH (ref 6–20)
CO2: 22 mmol/L (ref 22–32)
Calcium: 8.3 mg/dL — ABNORMAL LOW (ref 8.9–10.3)
Chloride: 101 mmol/L (ref 98–111)
Creatinine, Ser: 1.72 mg/dL — ABNORMAL HIGH (ref 0.61–1.24)
GFR, Estimated: 46 mL/min — ABNORMAL LOW (ref >60)
Glucose, Bld: 89 mg/dL (ref 70–99)
Potassium: 3.8 mmol/L (ref 3.5–5.1)
Sodium: 132 mmol/L — ABNORMAL LOW (ref 135–145)
Total Bilirubin: 2.2 mg/dL — ABNORMAL HIGH (ref 0.3–1.2)
Total Protein: 5.8 g/dL — ABNORMAL LOW (ref 6.5–8.1)

## 2022-12-16 LAB — TROPONIN I (HIGH SENSITIVITY): Troponin I (High Sensitivity): 18 ng/L — ABNORMAL HIGH (ref <18)

## 2022-12-16 LAB — AEROBIC/ANAEROBIC CULTURE W GRAM STAIN (SURGICAL/DEEP WOUND)

## 2022-12-16 LAB — LACTATE DEHYDROGENASE, PLEURAL OR PERITONEAL FLUID: LD, Fluid: 69 U/L — ABNORMAL HIGH (ref 3–23)

## 2022-12-16 MED ORDER — OXYCODONE HCL 5 MG PO TABS
10.0000 mg | ORAL_TABLET | ORAL | Status: DC | PRN
  Administered 2022-12-16 – 2022-12-19 (×17): 10 mg via ORAL
  Filled 2022-12-16 (×17): qty 2

## 2022-12-16 MED ORDER — LIDOCAINE HCL (PF) 1 % IJ SOLN
10.0000 mL | Freq: Once | INTRAMUSCULAR | Status: AC
  Administered 2022-12-16: 10 mL via SUBCUTANEOUS

## 2022-12-16 MED ORDER — ALBUMIN HUMAN 25 % IV SOLN
50.0000 g | Freq: Once | INTRAVENOUS | Status: AC
  Administered 2022-12-16: 50 g via INTRAVENOUS
  Filled 2022-12-16: qty 200

## 2022-12-16 NOTE — Progress Notes (Addendum)
Progress Note   Patient: Tom [name] ZOX:[id number] DOB: [birth year] DOA: 12/15/2022     0 DOS: the patient was seen and examined on 12/16/2022   Brief hospital course:  HPI on admission 12/15/22 by Dr. Arville Care: "Tom [name] is a 57 y.o. 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  He has been having worsening abdominal distention and mild associated dyspnea with  worsening bilateral lower extremity edema.  No reported fever or chills.  No nausea or vomiting or diarrhea.  No melena or bright red bleeding per rectum.  No chest pain or palpitations.  No cough or wheezing or hemoptysis.   ED Course: When the patient came to the ER, BP was 145/84 with otherwise normal vital signs.  BMP revealed mild hyponatremia 132 and  creatinine of 1.82 and BUN 25.  Albumin was 2.5 and total protein 6.8.  AST 46.  High-sensitivity troponin I was 16 and BNP 137.5.  CBC showed hemoglobin of 12 hematocrit 36.5 better than 3 days ago. EKG as reviewed by me : Normal sinus rhythm with a rate of 96 with low voltage QRS and poor R wave progression. Imaging: Chest x-ray showed no acute cardiopulmonary disease.   The patient was given 10 mg of p.o. oxycodone.  He will be admitted to an observation medical bed for further evaluation and management."   Pt admitted to medicine service. Interventional radiology consulted for paracentesis.   Assessment and Plan: * Ascites Significant abdominal distension, clearly symptomatic and is associated with anasarca due to advanced liver disease. --IR consulted for paracentesis --Last para on 6/17 with 6.9 L removed, cell counts ruled out SBP --Low suspicion for infection at this time, monitor  closely   Complication associated with orthopedic device [facility]) See Right Foot Pain.  Right foot pain Follows with [employer]. Seen by Dr. 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  His surgeries have been performed by Dr. Deborah Chalk at [facility] orthopedics.  " --No acute surgical needs. --Follow up with [facility] or Podiatry locally  --Continue home oxycodone  Essential hypertension --Continue home regimen  End stage liver disease (HCC) --Continue Lasix, Aldactone  --Does not appear to be on Xifaxan --Palliative care consult for GOC / code status, symptom mgmt  Dyslipidemia --Continue statin   GERD without esophagitis --Continue PPI   COPD with asthma Stable, no exacerbated. --Continue home inhalers  Alcoholic cirrhosis of liver with ascites (HCC) See ascites        Subjective: Pt resting in bed this AM.  Reports 10/10 right foot and ankle pain, requesting pain medication right away.  He ran out of his medication at home due to needing it more frequently than prescribed.  This happened last admission two weeks ago as well.  No fevers chills or abdominal pain (only discomfort from distention).  No other acute complaints.    Physical Exam: Vitals:   12/15/22 2042 12/15/22 2200 12/16/22 8119  12/16/22 0800  BP:   137/84 130/83  Pulse: 88 92 92 93  Resp: 16 17 18 16   Temp:   98.3 F (36.8 C) 97.9 F (36.6 C)  TempSrc:   Oral   SpO2: 100% 98% 98% 96%  Weight:      Height:       General exam: awake, alert, no acute distress HEENT: atraumatic, clear conjunctiva, anicteric sclera, moist  mucus membranes, hearing grossly normal  Respiratory system: CTAB diminished bases due to poor inspiratory volumes, no wheezes, rales or rhonchi, normal respiratory effort. Cardiovascular system: normal S1/S2, RRR, BLE pitting edema 3+  Gastrointestinal system: distended & non-tender abdomen Central nervous system: A&O x3. no gross focal neurologic deficits, normal speech Extremities: right heel small open ulcer with no drainage or erythema, right ankle healed surgical scars Severe BLE pitting edema Skin: dry, intact, normal temperature Psychiatry: depressed mood, congruent affect, judgement and insight appear normal   Data Reviewed:  Notable labs ---   Na 132, BUN 23, Cr 1.72, Ca 8.3, albumin 2.2, t bili 2.2, Hbg 10.2  Family Communication: None present. Pt able to update  Disposition: Status is: Inpatient Remains inpatient appropriate because: procedure planned today, uncontrolled pain   Planned Discharge Destination: Home    Time spent: 45 minutes  Author: Pennie Banter, DO 12/16/2022 3:43 PM  For on call review [email or web address].

## 2022-12-16 NOTE — Telephone Encounter (Signed)
Reviewed this request and the other phone note. This is a challenging situation since he already has rx available but we are a few days short and he took extra pills. There is not much I can do to accommodate this issue, we already discontinued the previous pain rx from hospitalist last time. That should not be an issue  Upon further review, patient has been admitted to hospital today 6/28 already. They will manage his pain inpatient  Damoney Julia, DO [facility] 12/16/2022, 12:48 PM 

## 2022-12-16 NOTE — Plan of Care (Signed)

## 2022-12-16 NOTE — Progress Notes (Signed)
     Referral received for Tom [name] re: goals of care discussion. Chart reviewed and updates received from RN Melissa. Patient unable to be assessed as he is in ultrasound during my attempt to visit.   PMT will re-attempt to meet with patient at a later time/date. Detailed note and recommendations to follow once GOC has been completed.   Thank you for your referral and allowing PMT to assist in Tom [name]'s care.   Georgiann Cocker, FNP-BC Palliative Medicine Team  Phone: [phone]  NO CHARGE

## 2022-12-16 NOTE — TOC CM/SW Note (Signed)
Transition of Care [facility]) - Inpatient Brief Assessment   Patient Details  Name: Tom [name] MRN: [id number] Date of Birth: 1966-05-02  Transition of Care [facility]) CM/SW Contact:    Chapman Fitch, RN Phone Number: 12/16/2022, 1:03 PM   Clinical Narrative:    Transition of Care Asessment: Insurance and Status: Insurance coverage has been reviewed Patient has primary care physician: Yes Home environment has been reviewed: single family home   Prior/Current Home Services: No current home services   Readmission risk has been reviewed: Yes Transition of care needs: no transition of care needs at this time

## 2022-12-16 NOTE — Plan of Care (Signed)
  Problem: Education: Goal: Knowledge of General Education information will improve Description: Including pain rating scale, medication(s)/side effects and non-pharmacologic comfort measures 12/16/2022 1103 by Latanya Maudlin, RN Outcome: Progressing 12/16/2022 1102 by Latanya Maudlin, RN Outcome: Progressing   Problem: Health Behavior/Discharge Planning: Goal: Ability to manage health-related needs will improve 12/16/2022 1103 by Latanya Maudlin, RN Outcome: Progressing 12/16/2022 1102 by Latanya Maudlin, RN Outcome: Progressing   Problem: Clinical Measurements: Goal: Ability to maintain clinical measurements within normal limits will improve 12/16/2022 1103 by Latanya Maudlin, RN Outcome: Progressing 12/16/2022 1102 by Latanya Maudlin, RN Outcome: Progressing Goal: Will remain free from infection 12/16/2022 1103 by Latanya Maudlin, RN Outcome: Progressing 12/16/2022 1102 by Latanya Maudlin, RN Outcome: Progressing Goal: Diagnostic test results will improve 12/16/2022 1103 by Latanya Maudlin, RN Outcome: Progressing 12/16/2022 1102 by Latanya Maudlin, RN Outcome: Progressing Goal: Respiratory complications will improve 12/16/2022 1103 by Latanya Maudlin, RN Outcome: Progressing 12/16/2022 1102 by Latanya Maudlin, RN Outcome: Progressing Goal: Cardiovascular complication will be avoided 12/16/2022 1103 by Latanya Maudlin, RN Outcome: Progressing 12/16/2022 1102 by Latanya Maudlin, RN Outcome: Progressing   Problem: Activity: Goal: Risk for activity intolerance will decrease 12/16/2022 1103 by Latanya Maudlin, RN Outcome: Progressing 12/16/2022 1102 by Latanya Maudlin, RN Outcome: Progressing   Problem: Nutrition: Goal: Adequate nutrition will be maintained 12/16/2022 1103 by Latanya Maudlin, RN Outcome: Progressing 12/16/2022 1102 by Latanya Maudlin, RN Outcome: Progressing   Problem: Coping: Goal: Level of anxiety will decrease 12/16/2022 1103 by Latanya Maudlin, RN Outcome: Progressing 12/16/2022 1102 by Latanya Maudlin, RN Outcome: Progressing   Problem: Elimination: Goal: Will not experience complications related to bowel motility 12/16/2022 1103 by Latanya Maudlin, RN Outcome: Progressing 12/16/2022 1102 by Latanya Maudlin, RN Outcome: Progressing Goal: Will not experience complications related to urinary retention 12/16/2022 1103 by Latanya Maudlin, RN Outcome: Progressing 12/16/2022 1102 by Latanya Maudlin, RN Outcome: Progressing   Problem: Pain Managment: Goal: General experience of comfort will improve 12/16/2022 1103 by Latanya Maudlin, RN Outcome: Progressing 12/16/2022 1102 by Latanya Maudlin, RN Outcome: Progressing   Problem: Safety: Goal: Ability to remain free from injury will improve 12/16/2022 1103 by Latanya Maudlin, RN Outcome: Progressing 12/16/2022 1102 by Latanya Maudlin, RN Outcome: Progressing   Problem: Skin Integrity: Goal: Risk for impaired skin integrity will decrease 12/16/2022 1103 by Latanya Maudlin, RN Outcome: Progressing 12/16/2022 1102 by Latanya Maudlin, RN Outcome: Progressing

## 2022-12-16 NOTE — Assessment & Plan Note (Addendum)
Follows with [employer]. Seen by Dr. 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  His surgeries have been performed by Dr. Deborah Chalk at [facility] orthopedics.  " --No acute surgical needs. --Follow up with [facility] or Podiatry locally  --Continue home oxycodone

## 2022-12-16 NOTE — Hospital Course (Signed)
HPI on admission 12/15/22 by Dr. Arville Care: "Tom [name] is a 57 y.o. 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  He has been having worsening abdominal distention and mild associated dyspnea with  worsening bilateral lower extremity edema.  No reported fever or chills.  No nausea or vomiting or diarrhea.  No melena or bright red bleeding per rectum.  No chest pain or palpitations.  No cough or wheezing or hemoptysis.   ED Course: When the patient came to the ER, BP was 145/84 with otherwise normal vital signs.  BMP revealed mild hyponatremia 132 and  creatinine of 1.82 and BUN 25.  Albumin was 2.5 and total protein 6.8.  AST 46.  High-sensitivity troponin I was 16 and BNP 137.5.  CBC showed hemoglobin of 12 hematocrit 36.5 better than 3 days ago. EKG as reviewed by me : Normal sinus rhythm with a rate of 96 with low voltage QRS and poor R wave progression. Imaging: Chest x-ray showed no acute cardiopulmonary disease.   The patient was given 10 mg of p.o. oxycodone.  He will be admitted to an observation medical bed for further evaluation and management."   Pt admitted to medicine service. Interventional radiology consulted for paracentesis.

## 2022-12-16 NOTE — Assessment & Plan Note (Signed)
See Right Foot Pain.

## 2022-12-16 NOTE — Telephone Encounter (Signed)
Reviewed this request and the other phone note. This is a challenging situation since he already has rx available but we are a few days short and he took extra pills. There is not much I can do to accommodate this issue, we already discontinued the previous pain rx from hospitalist last time. That should not be an issue  Upon further review, patient has been admitted to hospital today 6/28 already. They will manage his pain inpatient  Saralyn Pilar, DO [facility] 12/16/2022, 12:48 PM

## 2022-12-16 NOTE — Assessment & Plan Note (Signed)
See ascites

## 2022-12-17 ENCOUNTER — Other Ambulatory Visit: Payer: Self-pay

## 2022-12-17 DIAGNOSIS — E871 Hypo-osmolality and hyponatremia: Secondary | ICD-10-CM

## 2022-12-17 DIAGNOSIS — Z7189 Other specified counseling: Secondary | ICD-10-CM

## 2022-12-17 DIAGNOSIS — T849XXA Unspecified complication of internal orthopedic prosthetic device, implant and graft, initial encounter: Secondary | ICD-10-CM | POA: Diagnosis not present

## 2022-12-17 DIAGNOSIS — R601 Generalized edema: Secondary | ICD-10-CM

## 2022-12-17 DIAGNOSIS — M79671 Pain in right foot: Secondary | ICD-10-CM | POA: Diagnosis not present

## 2022-12-17 DIAGNOSIS — R140 Abdominal distension (gaseous): Secondary | ICD-10-CM

## 2022-12-17 LAB — COMPREHENSIVE METABOLIC PANEL
ALT: 14 U/L (ref 0–44)
AST: 30 U/L (ref 15–41)
Albumin: 2.2 g/dL — ABNORMAL LOW (ref 3.5–5.0)
Alkaline Phosphatase: 45 U/L (ref 38–126)
Anion gap: 7 (ref 5–15)
BUN: 20 mg/dL (ref 6–20)
CO2: 22 mmol/L (ref 22–32)
Calcium: 7.8 mg/dL — ABNORMAL LOW (ref 8.9–10.3)
Chloride: 101 mmol/L (ref 98–111)
Creatinine, Ser: 1.53 mg/dL — ABNORMAL HIGH (ref 0.61–1.24)
GFR, Estimated: 52 mL/min — ABNORMAL LOW (ref >60)
Glucose, Bld: 123 mg/dL — ABNORMAL HIGH (ref 70–99)
Potassium: 3.6 mmol/L (ref 3.5–5.1)
Sodium: 130 mmol/L — ABNORMAL LOW (ref 135–145)
Total Bilirubin: 2 mg/dL — ABNORMAL HIGH (ref 0.3–1.2)
Total Protein: 4.8 g/dL — ABNORMAL LOW (ref 6.5–8.1)

## 2022-12-17 LAB — CBC
HCT: 28.4 % — ABNORMAL LOW (ref 39.0–52.0)
Hemoglobin: 9.6 g/dL — ABNORMAL LOW (ref 13.0–17.0)
MCH: 28 pg (ref 26.0–34.0)
MCHC: 33.8 g/dL (ref 30.0–36.0)
MCV: 82.8 fL (ref 80.0–100.0)
Platelets: 192 10*3/uL (ref 150–400)
RBC: 3.43 MIL/uL — ABNORMAL LOW (ref 4.22–5.81)
RDW: 16.9 % — ABNORMAL HIGH (ref 11.5–15.5)
WBC: 7.3 10*3/uL (ref 4.0–10.5)
nRBC: 0 % (ref 0.0–0.2)

## 2022-12-17 LAB — MAGNESIUM: Magnesium: 1.6 mg/dL — ABNORMAL LOW (ref 1.7–2.4)

## 2022-12-17 MED ORDER — MAGNESIUM SULFATE 2 GM/50ML IV SOLN
2.0000 g | Freq: Once | INTRAVENOUS | Status: AC
  Administered 2022-12-17: 2 g via INTRAVENOUS
  Filled 2022-12-17: qty 50

## 2022-12-17 NOTE — Consult Note (Signed)
Consultation Note Date: 12/17/2022   Patient Name: Tom [name]  DOB: 08-Sep-1965  MRN: [id number]  Age / Sex: 57 y.o., male  PCP: Tom Cords, DO Referring Physician: Pennie Banter, DO  Reason for Consultation: Establishing goals of care   HPI/Brief Hospital Course: 57 y.o. male  with complicated past medical history of end stage liver disease, COPD, asthma, HLD, osteoarthritis, osteoporosis with chronic right foot/ankle pain related to previous surgeries that resulted in complications partially related to non-compliance with post-op care admitted from home on 12/15/2022 with worsening right foot/ankle pain (reports he was requiring more frequent dosing of oxycodone and ran out before time for refills) and worsening abdominal distention.  Noted last paracentesis 6/17 with removal of 6.9L of fluid  6/28 paracentesis with 13.9L of fluid removed   Palliative medicine was consulted for assisting with goals of care conversations as well as symptom management.  Subjective:  Extensive chart review has been completed prior to meeting patient including labs, vital signs, imaging, progress notes, orders, and available advanced directive documents from current and previous encounters.  Visited with Tom [name] at his bedside. Awake and alert, able to engage in conversation. Throughout conversations he confused current hospitalization with his extended hospitalization at [facility] for which he spent multiple days on ventilator earlier this year.  Introduced myself as a Publishing rights manager as a member of the palliative care team. Explained palliative medicine is specialized medical care for people living with serious illness. It focuses on providing relief from the symptoms and stress of a serious illness. The goal is to improve quality of life for both the patient and the family.   He shares his understanding of Palliative medicine as he has  recently been followed by outpatient Palliative care through [facility].  Started conversations surrounding Designer, industrial/product. Tom [name] refers to his "wife, children and grandchildren." He is not currently married, has a long term girlfriend-Tom [name], he has step children with Tom [name] but he does not have any biological children. He has multiple siblings that he does rely on for assistance. He has never completed Advanced Care documentation in the past. In the event he is unable to participate in goals of care conversations he wishes to appoint his girlfriend-Tom [name] as his surrogate decision maker. We discussed importance of completing AD documentation, reviewed document, left at bedside. Chaplain services consulted to assist with completion.  Attempted to gauge Tom [name] understanding of his current medical condition. 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 He shares he wishes to change providers managing his liver disease, he is also interested in pursuing liver transplant-expresses interest in changing to [facility]. Attempted to discuss end stage liver disease, decompensation related to frequent need for paracentesis. During these conversations, Tom [name] becomes emotional and shares he did not have an understanding of the severity of his diease. He continues to express his interest in transplant as he wishes for more time to spend with his family.  He shares his frustration with not being able to be active and spend quality of time with his family due to his chronic pain and inability to ambulate without assistance.  We discussed chronic pain related to right foot pain/surgeries and pain management. He shares his past grievances related to care at [facility] related to his multiple surgeries. Shares he has just recently been prescribed pain medication from medical providers (about 3 months).  Prior  to prescription medication he refers to "getting things done for his pain," relating to illegal drug use. We discussed his need for pain management specialist as an outpatient for which he agrees. We discussed possible transition to long acting pain medication continuing to utilize short acting as needed for breakthrough pain. We also discussed non-opioid medications for pain management including gabapentin. [name] was adamant that he is not interested in changing his medication regimen but would be open to being set up with pain management specialist to continue current regimen.  Attempted to illicit goals of care. We discussed Code Status, Full Code versus Do Not Resuscitate. Encouraged him to consider DNR/DNI status understanding evidenced based poor outcomes in similar hospitalized patients, as the cause of the arrest is likely associated with chronic/terminal disease rather than a reversible acute cardio-pulmonary event.  At this time, Tom [name] wishes to remain Full Code but is clear that he would not want long term life preserving measures such as trach/PEG.  I discussed importance of continued conversations with family/support persons and all members of their medical team regarding overall plan of care and treatment options ensuring decisions are in alignment with patients goals of care.  All questions/concerns addressed. Emotional support provided to patient/family/support persons. PMT will continue to follow and support patient as needed.  Objective: Primary Diagnoses: Present on Admission:  Ascites  Essential hypertension  GERD without esophagitis  COPD with asthma  Dyslipidemia  Right foot pain  Complication associated with orthopedic device (HCC)  Alcoholic cirrhosis of liver with ascites (HCC)  Hypomagnesemia   Physical Exam Constitutional:      General: He is not in acute distress.    Appearance: He is cachectic. He is ill-appearing.  Pulmonary:      Effort: Pulmonary effort is normal. No respiratory distress.  Abdominal:     General: There is distension.     Tenderness: There is no abdominal tenderness.  Skin:    General: Skin is warm and dry.     Coloration: Skin is jaundiced.     Findings: Bruising present.  Neurological:     Mental Status: He is alert.     Motor: Weakness present.     Vital Signs: BP 110/60 (BP Location: Left Arm)   Pulse 95   Temp 99 F (37.2 C)   Resp 16   Ht 6' (1.829 m)   Wt 117 kg   SpO2 95%   BMI 34.98 kg/m  Pain Scale: 0-10 POSS *See Group Information*: S-Acceptable,Sleep, easy to arouse Pain Score: 7   IO: Intake/output summary:  Intake/Output Summary (Last 24 hours) at 12/17/2022 1423 Last data filed at 12/17/2022 1416 Gross per 24 hour  Intake 143.17 ml  Output 200 ml  Net -56.83 ml    LBM: Last BM Date : 12/15/22 Baseline Weight: Weight: 117 kg Most recent weight: Weight: 117 kg      Assessment and Plan  SUMMARY OF RECOMMENDATIONS   Full Code Recommended long acting opioid with utilizing short acting for breakthrough pain and addition of gabapentin-patient unwilling to change current regimen Will need outpatient pain management follow-up Expresses interest in [facility] service to inquire about liver transplantation Ongoing GOC needed Recommended completion of AD-Chaplain services to assist PMT to continue to follow for ongoing needs and support  Discussed With: Primary team   Thank you for this consult and allowing Palliative Medicine to participate in the care of Kody L. Broers. Palliative medicine will continue to follow and assist as needed.  Time Total: 90 minutes  Time spent includes: Detailed review of medical records (labs, imaging, vital signs), medically appropriate exam (mental status, respiratory, cardiac, skin), discussed with treatment team, counseling and educating patient, family and staff, documenting clinical information, medication management and  coordination of care.   Signed by: Leeanne Deed, DNP, AGNP-C Palliative Medicine    Please contact Palliative Medicine Team phone at [phone] for questions and concerns.  For individual provider: See Loretha Stapler

## 2022-12-17 NOTE — Plan of Care (Signed)

## 2022-12-17 NOTE — Progress Notes (Signed)
Progress Note   Patient: Tom [name] [id number]:[id number] DOB: July 10, 1965 DOA: 12/15/2022     1 DOS: the patient was seen and examined on 12/17/2022   Brief hospital course:  HPI on admission 12/15/22 by Dr. Arville Care: "[name] is a 57 y.o. 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  He has been having worsening abdominal distention and mild associated dyspnea with  worsening bilateral lower extremity edema.  No reported fever or chills.  No nausea or vomiting or diarrhea.  No melena or bright red bleeding per rectum.  No chest pain or palpitations.  No cough or wheezing or hemoptysis.   ED Course: When the patient came to the ER, BP was 145/84 with otherwise normal vital signs.  BMP revealed mild hyponatremia 132 and  creatinine of 1.82 and BUN 25.  Albumin was 2.5 and total protein 6.8.  AST 46.  High-sensitivity troponin I was 16 and BNP 137.5.  CBC showed hemoglobin of 12 hematocrit 36.5 better than 3 days ago. EKG as reviewed by me : Normal sinus rhythm with a rate of 96 with low voltage QRS and poor R wave progression. Imaging: Chest x-ray showed no acute cardiopulmonary disease.   The patient was given 10 mg of p.o. oxycodone.  He will be admitted to an observation medical bed for further evaluation and management."   Pt admitted to medicine service. Interventional radiology consulted for paracentesis.   Assessment and Plan: * Ascites Significant abdominal distension, clearly symptomatic and is associated with anasarca due to advanced liver disease. --IR consulted for paracentesis --Last para on 6/17 with 6.9 L removed, cell counts ruled out SBP --Low suspicion for infection at this time, monitor  closely   Complication associated with orthopedic device [facility]) See Right Foot Pain.  Right foot pain Follows with [employer]. Seen by Dr. 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  His surgeries have been performed by Dr. Deborah Chalk at [facility] orthopedics.  " --No acute surgical needs. --Follow up with [facility] or Podiatry locally  --Continue home oxycodone  Essential hypertension --Continue home regimen  End stage liver disease (HCC) --Continue Lasix, Aldactone  --Does not appear to be on Xifaxan --Palliative care consult for GOC / code status, symptom mgmt  Dyslipidemia --Continue statin   GERD without esophagitis --Continue PPI   COPD with asthma Stable, no exacerbated. --Continue home inhalers  Alcoholic cirrhosis of liver with ascites (HCC) See ascites        Subjective: Pt sitting up in bed, speaking with Palliative Care NP when seen this AM.  Abdomen feels much better after high volume paracentesis yesterday.  Pain in right foot/ankle remains poorly controlled.  Palliative NP discussing outpatient pain management options.     Physical Exam: Vitals:   12/16/22 1650 12/16/22 1947 12/17/22 0426 12/17/22 0958  BP: 124/67 125/70 109/64 110/60  Pulse: 94 99 94 95  Resp: 16 18 18  16  Temp: 97.9 F (36.6 C) 98.3 F (36.8 C) 97.7 F (36.5 C) 99 F (37.2 C)  TempSrc: Oral Oral Oral   SpO2: 98% 98% 93% 95%  Weight:      Height:       General exam: awake, alert, no acute distress HEENT: moist mucus membranes, hearing grossly normal  Respiratory system: on room air, normal  respiratory effort. Cardiovascular system: normal S1/S2, RRR, BLE pitting edema 3+  Gastrointestinal system: soft & non-tender abdomen Central nervous system: A&O x3. no gross focal neurologic deficits, normal speech Extremities: right ankle surgical scars, right heel small open ulcer with no drainage or erythema, right ankle healed surgical scars Severe BLE pitting edema Skin: dry, intact, normal temperature Psychiatry: normal mood, congruent affect, judgement and insight appear normal   Data Reviewed:  Notable labs ---   Na 132 >> 130, Cr 1.72 >> 1.53, Mg 1.6, Ca 7.8, albumin 2.2, t bili 2.0, Hbg 10.2 >> 9.6  Family Communication: None present. Pt able to update  Disposition: Status is: Inpatient Remains inpatient appropriate because: electrolyte derangements that require close monitoring for correction and stability, uncontrolled pain   Planned Discharge Destination: Home    Time spent: 35 minutes  Author: Pennie Banter, DO 12/17/2022 1:34 PM  For on call review [email or web address].

## 2022-12-17 NOTE — Progress Notes (Signed)
   12/17/22 1500  Spiritual Encounters  Type of Visit Initial  Care provided to: Patient  Referral source Nurse (RN/NT/LPN)  Reason for visit Advance directives  OnCall Visit Yes  Spiritual Framework  Presenting Themes Courage hope and growth  Patient Stress Factors Major life changes  Interventions  Spiritual Care Interventions Made Normalization of emotions;Prayer  Intervention Outcomes  Outcomes Awareness of support   Chaplain responded to [facility] consult to help with AD. Patient shared his narrative. Chaplain will attempt to find notary. If not, Chaplain services will try and complete AD on Monday.

## 2022-12-18 DIAGNOSIS — K7031 Alcoholic cirrhosis of liver with ascites: Secondary | ICD-10-CM | POA: Diagnosis not present

## 2022-12-18 DIAGNOSIS — T849XXA Unspecified complication of internal orthopedic prosthetic device, implant and graft, initial encounter: Secondary | ICD-10-CM | POA: Diagnosis not present

## 2022-12-18 DIAGNOSIS — E871 Hypo-osmolality and hyponatremia: Secondary | ICD-10-CM | POA: Diagnosis not present

## 2022-12-18 DIAGNOSIS — M79671 Pain in right foot: Secondary | ICD-10-CM | POA: Diagnosis not present

## 2022-12-18 LAB — COMPREHENSIVE METABOLIC PANEL
ALT: 13 U/L (ref 0–44)
AST: 34 U/L (ref 15–41)
Albumin: 1.8 g/dL — ABNORMAL LOW (ref 3.5–5.0)
Alkaline Phosphatase: 57 U/L (ref 38–126)
Anion gap: 4 — ABNORMAL LOW (ref 5–15)
BUN: 16 mg/dL (ref 6–20)
CO2: 23 mmol/L (ref 22–32)
Calcium: 7.5 mg/dL — ABNORMAL LOW (ref 8.9–10.3)
Chloride: 101 mmol/L (ref 98–111)
Creatinine, Ser: 1.38 mg/dL — ABNORMAL HIGH (ref 0.61–1.24)
GFR, Estimated: 59 mL/min — ABNORMAL LOW (ref >60)
Glucose, Bld: 139 mg/dL — ABNORMAL HIGH (ref 70–99)
Potassium: 3.6 mmol/L (ref 3.5–5.1)
Sodium: 128 mmol/L — ABNORMAL LOW (ref 135–145)
Total Bilirubin: 1.8 mg/dL — ABNORMAL HIGH (ref 0.3–1.2)
Total Protein: 4.7 g/dL — ABNORMAL LOW (ref 6.5–8.1)

## 2022-12-18 LAB — CBC
HCT: 28.8 % — ABNORMAL LOW (ref 39.0–52.0)
Hemoglobin: 9.7 g/dL — ABNORMAL LOW (ref 13.0–17.0)
MCH: 28.1 pg (ref 26.0–34.0)
MCHC: 33.7 g/dL (ref 30.0–36.0)
MCV: 83.5 fL (ref 80.0–100.0)
Platelets: 174 10*3/uL (ref 150–400)
RBC: 3.45 MIL/uL — ABNORMAL LOW (ref 4.22–5.81)
RDW: 16.6 % — ABNORMAL HIGH (ref 11.5–15.5)
WBC: 8 10*3/uL (ref 4.0–10.5)
nRBC: 0 % (ref 0.0–0.2)

## 2022-12-18 LAB — MAGNESIUM: Magnesium: 1.7 mg/dL (ref 1.7–2.4)

## 2022-12-18 LAB — AEROBIC/ANAEROBIC CULTURE W GRAM STAIN (SURGICAL/DEEP WOUND)

## 2022-12-18 NOTE — Assessment & Plan Note (Signed)
Na 132 on admission, down-trending since.  Na 130 today.  Suspect hypervolemic.  --Fluid restriction --Low sodium diet --repeat BMP at follow up

## 2022-12-18 NOTE — Progress Notes (Signed)
Progress Note   Patient: Tom [name] [id number]:[id number] DOB: Oct 21, 1965 DOA: 12/15/2022     2 DOS: the patient was seen and examined on 12/18/2022   Brief hospital course:  HPI on admission 12/15/22 by Dr. Arville Care: "[name] is a 57 y.o. 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  He has been having worsening abdominal distention and mild associated dyspnea with  worsening bilateral lower extremity edema.  No reported fever or chills.  No nausea or vomiting or diarrhea.  No melena or bright red bleeding per rectum.  No chest pain or palpitations.  No cough or wheezing or hemoptysis.   ED Course: When the patient came to the ER, BP was 145/84 with otherwise normal vital signs.  BMP revealed mild hyponatremia 132 and  creatinine of 1.82 and BUN 25.  Albumin was 2.5 and total protein 6.8.  AST 46.  High-sensitivity troponin I was 16 and BNP 137.5.  CBC showed hemoglobin of 12 hematocrit 36.5 better than 3 days ago. EKG as reviewed by me : Normal sinus rhythm with a rate of 96 with low voltage QRS and poor R wave progression. Imaging: Chest x-ray showed no acute cardiopulmonary disease.   The patient was given 10 mg of p.o. oxycodone.  He will be admitted to an observation medical bed for further evaluation and management."   Pt admitted to medicine service. Interventional radiology consulted for paracentesis.   Assessment and Plan: * Ascites Significant abdominal distension, clearly symptomatic and is associated with anasarca due to advanced liver disease. --IR consulted for paracentesis --13.9 L removed by para on 6/28 --Prior para on 6/17 with 6.9 L removed, cell counts ruled out SBP --Low suspicion for  infection at this time, monitor closely   Hyponatremia Na 132 on admission, down-trending since.  Na 128 today.  Suspect hypervolemic.  --Fluid restriction --Low sodium diet --Add on serum osm --Daily BMPs until improved and stable  Complication associated with orthopedic device [facility]) See Right Foot Pain.  Right foot pain Follows with [employer]. Seen by Dr. 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  His surgeries have been performed by Dr. Deborah Chalk at [facility] orthopedics.  " --No acute surgical needs. --Follow up with [facility] or Podiatry locally  --Continue home oxycodone  Essential hypertension --Continue home regimen  End stage liver disease (HCC) --Continue Lasix, Aldactone  --Does not appear to be on Xifaxan --Palliative care consult for GOC / code status, symptom mgmt  Dyslipidemia --Continue statin   GERD without esophagitis --Continue PPI   COPD with asthma Stable, no exacerbated. --Continue home inhalers  Alcoholic cirrhosis of liver with ascites (HCC) See ascites        Subjective: Pt sitting up in bed, was on phone with his GF initially.  She plans to come visit him today.  Pt reports ongoing right foot/ankle pain but otherwise states he feels better than he has in a  while.  We discussed is declining sodium levels, talked about low sodium diet, fluid restriction.  He denies other acute complaints.  Palliative NP in to see pt as well.     Physical Exam: Vitals:   12/17/22 0958 12/17/22 1950 12/18/22 0433 12/18/22 0824  BP: 110/60 122/78 110/68 106/79  Pulse: 95 95 98 95   Resp: 16 18 16 18   Temp: 99 F (37.2 C) 98.4 F (36.9 C) 98.2 F (36.8 C) 98.1 F (36.7 C)  TempSrc:  Oral Oral   SpO2: 95% 98% 95% 94%  Weight:      Height:       General exam: awake, alert, no acute distress HEENT: moist mucus membranes, hearing grossly normal  Respiratory system: on room air, normal respiratory effort. Cardiovascular system: normal S1/S2, RRR, BLE pitting edema 3+  Gastrointestinal system: soft & non-tender abdomen Central nervous system: A&O x3. no gross focal neurologic deficits, normal speech Extremities: right ankle surgical scars, right heel small open ulcer with no drainage or erythema, right ankle healed surgical scars Severe BLE pitting edema Skin: dry, intact, normal temperature Psychiatry: normal mood, congruent affect, judgement and insight appear normal   Data Reviewed:  Notable labs ---    Na 132 >> 130 >> 128,  Cr 1.72 >> 1.53 >> 1.38,  Ca 7.5, albumin 1.8, Hbg 9.7 from 9.6  Family Communication: None present. Pt able to update  Disposition: Status is: Inpatient Remains inpatient appropriate because: electrolyte derangements that require close monitoring for correction and stability, uncontrolled pain   Planned Discharge Destination: Home    Time spent: 35 minutes  Author: Pennie Banter, DO 12/18/2022 12:42 PM  For on call review [email or web address].

## 2022-12-18 NOTE — Discharge Instructions (Signed)
Transportation Agency Name: [place] [employer] Address: [address] [place], [place] [address] Phone: [phone] Email: [email or web address] Website: www.alamanceservices.org Service(s) Offered: Housing services, self-sufficiency, congregate meal  program, weatherization program, heating appliance  repair/replacement program, emergency food assistance,  housing counseling, home ownership program, [place] program. October 13, 2016 22  Agency Name: [place] [employer] ([employer]) Address: [address], [place], [place] [address] Phone: [phone] Email:  Website: www.acta-[email or web address] Service(s) Offered: Transportation for general public, subscription and demand  response; Dial-a-Ride for citizens 60 years of age or older. Agency Name: Department of Social Services Address: [address], [place],  [address] Phone: [phone] Service(s) Offered: Child support services; child welfare services; food stamps;  Medicaid; work first family assistance; and aid with fuel,  rent, food and medicine, transportation assistance.  Agency Name: [employer]  [employer] Address: Phone: [phone] Service(s) Offered: Transports veterans to the [facility]. Call  forty-eight hours in advance and leave the name, telephone  number, date, and time of appointment. Veteran will be  contacted by the driver the day before the appointment to  arrange a pick up point   

## 2022-12-18 NOTE — TOC Progression Note (Signed)
Transition of Care [facility]) - Progression Note    Patient Details  Name: Tom [name] MRN: [id number] Date of Birth: 04-12-1966  Transition of Care [facility]) CM/SW Contact  Bing Quarry, RN Phone Number: 12/18/2022, 2:42 PM  Clinical Narrative:   6/30: SDOH transportation insecurity identified and resources placed in AVS summary via Care Coordination. Gabriel Cirri RN CM          Expected Discharge Plan and Services                                               Social Determinants of Health (SDOH) Interventions SDOH Screenings   Food Insecurity: No Food Insecurity (12/04/2022)  Housing: Patient Declined (12/04/2022)  Transportation Needs: Unmet Transportation Needs (12/04/2022)  Utilities: Not At Risk (12/04/2022)  Alcohol Screen: Low Risk  (09/21/2022)  Depression (PHQ2-9): Low Risk  (03/21/2022)  Financial Resource Strain: Low Risk  (10/09/2022)  Physical Activity: Unknown (10/09/2022)  Social Connections: Unknown (10/09/2022)  Stress: Stress Concern Present (10/09/2022)  Tobacco Use: Medium Risk (12/16/2022)    Readmission Risk Interventions    12/16/2022    5:06 PM  Readmission Risk Prevention Plan  PCP or Specialist Appt within 5-7 Days Complete  Medication Review (RN CM) Complete

## 2022-12-18 NOTE — Progress Notes (Signed)
Daily Progress Note   Patient Name: Tom [name]       Date: 12/18/2022 DOB: 11-15-1965  Age: 57 y.o. MRN#: [id number] Attending Physician: Pennie Banter, DO Primary Care Physician: [name], DO Admit Date: 12/15/2022  Reason for Consultation/Follow-up: Establishing goals of care  HPI/Brief Hospital Review: 57 y.o. male  with complicated past medical history of end stage liver disease, COPD, asthma, HLD, osteoarthritis, osteoporosis with chronic right foot/ankle pain related to previous surgeries that resulted in complications partially related to non-compliance with post-op care admitted from home on 12/15/2022 with worsening right foot/ankle pain (reports he was requiring more frequent dosing of oxycodone and ran out before time for refills) and worsening abdominal distention.   Noted last paracentesis 6/17 with removal of 6.9L of fluid   6/28 paracentesis with 13.9L of fluid removed    Palliative medicine was consulted for assisting with goals of care conversations as well as symptom management.  Subjective: Extensive chart review has been completed prior to meeting patient including labs, vital signs, imaging, progress notes, orders, and available advanced directive documents from current and previous encounters.    Visited with Mr. Ballog at his bedside. Awake and alert, mostly oriented with moments of confusion. Asked to return when his sister-Janice at bedside.  Later visited with sister-Janice at bedside. Per Mr. Scalici request we reviewed topics discussed from initial conversations had yesterday. We again reviewed pain management, attempted to explain difference between scheduled versus as needed pain medication as well as short acting versus  long acting. Discussed importance of pain management through a specialist such as pain management provider. Mr. Coelho becomes increasingly frustrated when discussing pain management as he feels providers are attempting to change his orders for which he does not agree. Again does seem accepting of attempting alternative regimens. Sister-Janice voices understanding of need for pain specialist. Likely to be set up at discharge.  We discussed liver disease and concern for decompensation in relation to large amounts of fluid removed via paracentesis back to back in a relatively short timeframe. Sister-Janice voices her frustrations they have experienced in the past with [facility] specialist clinic. Again,they both are interested in learning more or seeking liver transplant eligibility. They wish to pursue this with [facility]. We discussed signs/symptoms related to liver disease such as ascites contributing to abdominal  distention, SHOB or difficulty breathing, temporal wasting, jaundice of skin and sclera, abnormal or easy bruising all of which he has experienced. We discussed overall chronic disease trajectory of liver disease, anticipated and expected decline.  We also reviewed Advanced Care documents. Confirmed as noted in Living Will Mr. Nicastro wishes to remain Full Code at this time but would not want long term life preserving measures. He is clear in sharing he would not want to kept alive artificially with trach/PEG.  Answered and addressed all questions and concerns. PMT to continue to follow for ongoing needs and support.  Objective:  Physical Exam Constitutional:      General: He is not in acute distress.    Appearance: He is ill-appearing.     Comments: Temporal wasting  Pulmonary:     Effort: Pulmonary effort is normal. No respiratory distress.  Abdominal:     General: There is distension.  Skin:    General: Skin is warm and dry.     Coloration: Skin is jaundiced.     Findings:  Bruising present.  Neurological:     Mental Status: He is alert.     Motor: Weakness present.             Vital Signs: BP 106/79 (BP Location: Right Arm)   Pulse 95   Temp 98.1 F (36.7 C)   Resp 18   Ht 6' (1.829 m)   Wt 117 kg   SpO2 94%   BMI 34.98 kg/m  SpO2: SpO2: 94 % O2 Device: O2 Device: Room Air O2 Flow Rate:     Palliative Care Assessment & Plan   Assessment/Recommendation/Plan  Full Code AD completed, still needs notary-Chaplain services aware and following Needs outpatient pain management referral-primary team aware and coordinating Requests to have referral placed to [facility] hepatology clinic for transplant work-up-primary team aware and coordinating PMT to continue to follow for ongoing needs and support   Thank you for allowing the Palliative Medicine Team to assist in the care of this patient.  Total time:  65 minutes  Time spent includes: Detailed review of medical records (labs, imaging, vital signs), medically appropriate exam (mental status, respiratory, cardiac, skin), discussed with treatment team, counseling and educating patient, family and staff, documenting clinical information, medication management and coordination of care.  Leeanne Deed, DNP, AGNP-C Palliative Medicine   Please contact Palliative Medicine Team phone at [phone] for questions and concerns.

## 2022-12-19 ENCOUNTER — Telehealth: Payer: Self-pay | Admitting: Family Medicine

## 2022-12-19 DIAGNOSIS — T8484XA Pain due to internal orthopedic prosthetic devices, implants and grafts, initial encounter: Secondary | ICD-10-CM

## 2022-12-19 DIAGNOSIS — G8929 Other chronic pain: Secondary | ICD-10-CM

## 2022-12-19 DIAGNOSIS — Z515 Encounter for palliative care: Secondary | ICD-10-CM

## 2022-12-19 LAB — AEROBIC/ANAEROBIC CULTURE W GRAM STAIN (SURGICAL/DEEP WOUND)

## 2022-12-19 LAB — BASIC METABOLIC PANEL
Anion gap: 6 (ref 5–15)
BUN: 16 mg/dL (ref 6–20)
CO2: 23 mmol/L (ref 22–32)
Calcium: 7.7 mg/dL — ABNORMAL LOW (ref 8.9–10.3)
Chloride: 101 mmol/L (ref 98–111)
Creatinine, Ser: 1.34 mg/dL — ABNORMAL HIGH (ref 0.61–1.24)
GFR, Estimated: >60 mL/min (ref >60)
Glucose, Bld: 123 mg/dL — ABNORMAL HIGH (ref 70–99)
Potassium: 4.1 mmol/L (ref 3.5–5.1)
Sodium: 130 mmol/L — ABNORMAL LOW (ref 135–145)

## 2022-12-19 LAB — MAGNESIUM: Magnesium: 1.8 mg/dL (ref 1.7–2.4)

## 2022-12-19 MED ORDER — ORAL CARE MOUTH RINSE: 15.0000 mL | OROMUCOSAL | Status: DC | PRN

## 2022-12-19 MED ORDER — OXYCODONE HCL 10 MG PO TABS
10.0000 mg | ORAL_TABLET | Freq: Four times a day (QID) | ORAL | 0 refills | Status: DC | PRN
Start: 2022-12-19 — End: 2022-12-19

## 2022-12-19 MED ORDER — OXYCODONE HCL 10 MG PO TABS
10.0000 mg | ORAL_TABLET | Freq: Four times a day (QID) | ORAL | 0 refills | Status: DC | PRN
Start: 2022-12-19 — End: 2023-01-03

## 2022-12-19 NOTE — Discharge Summary (Signed)
Physician Discharge Summary   Patient: Tom [name] MRN: [id number] DOB: 1965-06-27  Admit date:     12/15/2022  Discharge date: 12/19/2022  Discharge Physician: Tom [name]   PCP: Tom Cords, DO   Recommendations at discharge:  {Tip this will not be part of the note when signed- Example include specific recommendations for outpatient follow-up, pending tests to follow-up on. (Optional):26781}  ***  Discharge Diagnoses: Principal Problem:   Ascites Active Problems:   Right foot pain   Complication associated with orthopedic device (HCC)   Hyponatremia   Essential hypertension   Dyslipidemia   End stage liver disease (HCC)   Alcoholic cirrhosis of liver with ascites (HCC)   COPD with asthma   GERD without esophagitis   Hypomagnesemia  Resolved Problems:   * No resolved hospital problems. [facility] Course: HPI on admission 12/15/22 by Dr. Arville Care: "Tom [name] is a 57 y.o. 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  He has been having worsening abdominal distention and mild associated dyspnea with  worsening bilateral lower extremity edema.  No reported fever or chills.  No nausea or vomiting or diarrhea.  No melena or bright red bleeding per rectum.  No chest pain or palpitations.  No cough or wheezing or hemoptysis.   ED Course: When the patient came to the ER, BP was 145/84 with otherwise normal vital signs.  BMP revealed mild hyponatremia 132 and  creatinine of 1.82 and BUN 25.  Albumin was 2.5 and total protein 6.8.  AST 46.  High-sensitivity troponin I was 16 and BNP 137.5.  CBC showed hemoglobin of 12 hematocrit 36.5 better than 3 days ago. EKG as reviewed by me : Normal  sinus rhythm with a rate of 96 with low voltage QRS and poor R wave progression. Imaging: Chest x-ray showed no acute cardiopulmonary disease.   The patient was given 10 mg of p.o. oxycodone.  He will be admitted to an observation medical bed for further evaluation and management."   Pt admitted to medicine service. Interventional radiology consulted for paracentesis.  Assessment and Plan: * Ascites Significant abdominal distension, clearly symptomatic and is associated with anasarca due to advanced liver disease. --IR consulted for paracentesis --13.9 L removed by para on 6/28 --Prior para on 6/17 with 6.9 L removed, cell counts ruled out SBP --Low suspicion for infection at this time, monitor closely   Hyponatremia Na 132 on admission, down-trending since.  Na 128 today.  Suspect hypervolemic.  --Fluid restriction --Low sodium diet --Add on serum osm --Daily BMPs until improved and stable  Complication associated with orthopedic device [facility]) See Right Foot Pain.  Right foot pain Follows with [facility]. Seen by Dr. 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  His surgeries have been performed by Dr. Deborah Chalk at [facility] orthopedics.  " --No acute surgical needs. --Follow up with [facility] or Podiatry locally  --Continue home  oxycodone  Essential hypertension --Continue home regimen  End stage liver disease (HCC) --Continue Lasix, Aldactone  --Does not appear to be on Xifaxan --Palliative care consult for GOC / code status, symptom mgmt  Dyslipidemia --Continue statin   GERD without  esophagitis --Continue PPI   COPD with asthma Stable, no exacerbated. --Continue home inhalers  Alcoholic cirrhosis of liver with ascites (HCC) See ascites      {Tip this will not be part of the note when signed Body mass index is 34.98 kg/m. , ,  (Optional):26781}  {(NOTE) Pain control PDMP Statment (Optional):26782} Consultants: *** Procedures performed: ***  Disposition: {Plan; Disposition:26390} Diet recommendation:  {Diet_Plan:26776} DISCHARGE MEDICATION: Allergies as of 12/19/2022       Reactions   Alfuzosin Other (See Comments)   Dizziness    Amlodipine Nausea And Vomiting   Bee Venom Swelling   Cymbalta [duloxetine Hcl] Other (See Comments)   Emesis   Duloxetine Other (See Comments)   Emesis   Lisinopril    Flomax [tamsulosin Hcl] Itching        Medication List     TAKE these medications    albuterol (2.5 MG/3ML) 0.083% nebulizer solution Commonly known as: PROVENTIL Take 3 mLs (2.5 mg total) by nebulization every 4 (four) hours as needed for wheezing or shortness of breath.   atorvastatin 40 MG tablet Commonly known as: LIPITOR Take 1 tablet (40 mg total) by mouth daily.   budesonide-formoterol 80-4.5 MCG/ACT inhaler Commonly known as: Symbicort Take 2 puffs first thing in am and then another 2 puffs about 12 hours later.   diclofenac Sodium 1 % Gel Commonly known as: VOLTAREN Apply 4 g topically 4 (four) times daily.   DSS 100 MG Caps TAKE 1 CAPSULE (100 MG TOTAL) BY MOUTH TWO (2) TIMES A DAY   EPINEPHrine 0.3 mg/0.3 mL Soaj injection Commonly known as: EpiPen 2-Pak Inject 0.3 mg into the muscle as needed for anaphylaxis.   famotidine 20 MG tablet Commonly known as: Pepcid Take 1 tablet (20 mg total) by mouth daily after supper. One after supper   folic acid 1 MG tablet Commonly known as: FOLVITE Take 1 tablet (1 mg total) by mouth daily.   furosemide 20 MG tablet Commonly known as: LASIX Take 1 tablet (20 mg total) by mouth  daily.   hydrOXYzine 25 MG capsule Commonly known as: VISTARIL Take 1 capsule (25 mg total) by mouth every 8 (eight) hours as needed.   lactulose 10 GM/15ML solution Commonly known as: CHRONULAC SMARTSIG:15 Milliliter(s) By Mouth Every Other Day   lidocaine 5 % Commonly known as: LIDODERM Place 1 patch onto the skin daily.   LIVER SUPPORT SL Place under the tongue.   magnesium oxide 400 (240 Mg) MG tablet Commonly known as: MAG-OX Take 2 tablets by mouth 2 (two) times daily.   multivitamin capsule Take 1 capsule by mouth daily.   ondansetron 4 MG disintegrating tablet Commonly known as: ZOFRAN-ODT Take 1 tablet (4 mg total) by mouth every 8 (eight) hours as needed for nausea or vomiting.   Oxycodone HCl 10 MG Tabs Take 1 tablet (10 mg total) by mouth 4 (four) times daily as needed for up to 5 days (modereate to severe pain).   pantoprazole 40 MG tablet Commonly known as: Protonix Take 1 tablet (40 mg total) by mouth daily before breakfast.   spironolactone 25 MG tablet Commonly known as: ALDACTONE Take 1 tablet (25 mg total) by mouth daily.  Follow-up Information     Tom Cords, DO. Schedule an appointment as soon as possible for a visit.   Specialty: Family Medicine Why: Hospital follow up, pain mgmt.  Pt wants referral to [facility] hepatology for transplant evaluation. Contact information: [address] [place] [address] [phone]         Blane Ohara, Earleen Newport, MD. Call.   Specialty: Gastroenterology Why: Request new patient appointment for evaluation for liver transplant.  May need PCP referral. Contact information: [address] [place] [address] [phone]         Bennie Dallas, MD Follow up.   Specialties: Interventional Radiology, Diagnostic Radiology, Radiology Why: for TIPS procedure -- their office should contact you to set up appointment, but if you don't hear from them, call to request  appointment. Contact information: [address] [place] [address] [phone]         [facility] Follow up.   Specialty: Pain Medicine Why: I requested referral, they should call you to schedule appoitnment.  If you do not hear from them soon, call to request appiontment. Contact information: [address] [id number] [place] [address] [phone]               Discharge Exam: Ceasar Mons Weights   12/15/22 1909  Weight: 117 kg   ***  Condition at discharge: {DC Condition:26389}  The results of significant diagnostics from this hospitalization (including imaging, microbiology, ancillary and laboratory) are listed below for reference.   Imaging Studies: US Paracentesis  Result Date: 12/16/2022 INDICATION: Ascites EXAM: ULTRASOUND GUIDED  PARACENTESIS MEDICATIONS: None. COMPLICATIONS: None immediate. PROCEDURE: Informed written consent was obtained from the patient after a discussion of the risks, benefits and alternatives to treatment. A timeout was performed prior to the initiation of the procedure. Initial ultrasound scanning demonstrates a large amount of ascites within the right lower abdominal quadrant. The right lower abdomen was prepped and draped in the usual sterile fashion. 1% lidocaine was used for local anesthesia. Following this, a 19 gauge, 7-cm, Yueh catheter was introduced. An ultrasound image was saved for documentation purposes. The paracentesis was performed. The catheter was removed and a dressing was applied. The patient tolerated the procedure well without immediate post procedural complication. FINDINGS: A total of approximately 13.9 L of clear peritoneal fluid was removed. IMPRESSION: Successful ultrasound-guided paracentesis yielding 13.9 liters of clear ascites. Electronically Signed   By: Olive Bass M.D.   On: 12/16/2022 16:29   DG Chest 2  View  Result Date: 12/15/2022 CLINICAL DATA:  Shortness of breath EXAM: CHEST - 2 VIEW COMPARISON:  12/03/2022 FINDINGS: Unchanged cardiac and mediastinal contours. Redemonstrated bilateral calcified nodules. No new focal pulmonary opacity. No pleural effusion or pneumothorax. No acute osseous abnormality. Redemonstrated plate and screw fixation of the mid to distal right clavicle. IMPRESSION: No active cardiopulmonary disease. Electronically Signed   By: Wiliam Ke M.D.   On: 12/15/2022 19:50   US Paracentesis  Result Date: 12/05/2022 INDICATION: Patient with a history of cirrhosis with recurrent ascites. Interventional radiology asked to perform a diagnostic and therapeutic paracentesis. EXAM: ULTRASOUND GUIDED PARACENTESIS MEDICATIONS: 1% lidocaine 20 mL COMPLICATIONS: None immediate. PROCEDURE: Informed written consent was obtained from the patient after a discussion of the risks, benefits and alternatives to treatment. A timeout was performed prior to the initiation of the procedure. Initial ultrasound scanning demonstrates a large amount of ascites within the  right lower abdominal quadrant. The right lower abdomen was prepped and draped in the usual sterile fashion. 1% lidocaine was used for local anesthesia. Following this, a 19 gauge, 7-cm, Yueh catheter was introduced. An ultrasound image was saved for documentation purposes. The paracentesis was performed. After approximately 1 L the patient inadvertently dislodged the Yueh and bright red blood was being pulled into the catheter. The Lorelee Mclaurin Citron was removed. Ultrasound imaging demonstrated large volume ascites and the site was re-prepped and a new 7-cm Zevin Nevares Citron was inserted into the same puncture site with return of clear yellow peritoneal fluid. After approximately another liter of fluid removal the catheter stopped draining. Ultrasound imaging demonstrated large volume ascites and a new area in the right lower quadrant was prepped and draped in the normal  sterile fashion. This new site was anesthetized with 1% lidocaine and a 10 cm Yueh was inserted. The paracentesis was performed. The catheter was removed and a dressing was applied. The patient tolerated the procedure well without immediate post procedural complication. FINDINGS: A total of approximately 6.9 L of clear yellow fluid was removed. Samples were sent to the laboratory as requested by the clinical team. IMPRESSION: Successful ultrasound-guided paracentesis yielding 6.9 liters of peritoneal fluid. Procedure performed by Alwyn Ren NP and supervised by Dr. Archer Asa. PLAN: If the patient eventually requires >/=2 paracenteses in a 30 day period, candidacy for formal evaluation by the [facility] will be assessed. Electronically Signed   By: Malachy Moan M.D.   On: 12/05/2022 15:25   DG Chest Port 1 View  Result Date: 12/03/2022 CLINICAL DATA:  Shortness of breath EXAM: PORTABLE CHEST 1 VIEW COMPARISON:  Chest x-ray 12/02/2021 FINDINGS: Bilateral calcified nodules are again seen. There is no focal lung infiltrate, pleural effusion or pneumothorax. The cardiomediastinal silhouette is within normal limits. No acute fractures are seen. Orthopedic plate is seen in the right clavicle. IMPRESSION: 1. No active disease. 2. Bilateral calcified pulmonary nodules. Electronically Signed   By: Darliss Cheney M.D.   On: 12/03/2022 22:18    Microbiology: Results for orders placed or performed during the hospital encounter of 12/15/22  Aerobic/Anaerobic Culture w Gram Stain (surgical/deep wound)     Status: None (Preliminary result)   Collection Time: 12/16/22  3:32 PM   Specimen: PATH Cytology Peritoneal fluid  Result Value Ref Range Status   Specimen Description   Final    PERITONEAL Performed at [facility], [address], [place], [place] [address]    Special Requests   Final    NONE Performed at [facility], [address] [place], [place] [address]    Gram Stain   Final    WBC PRESENT, PREDOMINANTLY MONONUCLEAR NO ORGANISMS SEEN CYTOSPIN SMEAR    Culture   Final    NO GROWTH 3 DAYS NO ANAEROBES ISOLATED; CULTURE IN PROGRESS FOR 5 DAYS Performed at [facility], [address] [place], [place] [address]    Report Status PENDING  Incomplete    Labs: CBC: Recent Labs  Lab 12/12/22 1537 12/15/22 1925 12/16/22 0522 12/17/22 0421 12/18/22 0906  WBC 7.9 8.6 8.1 7.3 8.0  NEUTROABS  --  5.4  --   --   --   HGB 10.7* 12.0* 10.2* 9.6* 9.7*  HCT 32.8* 36.5* 30.8* 28.4* 28.8*  MCV 84.1 84.1 83.0 82.8 83.5  PLT 225 266 210 192 174   Basic Metabolic Panel: Recent Labs  Lab 12/15/22 1925 12/16/22 0522 12/17/22 0421  12/18/22 0906 12/19/22 0519  NA 132* 132* 130* 128* 130*  K 4.3 3.8 3.6 3.6 4.1  CL 100 101 101 101 101  CO2 21* 22 22 23 23   GLUCOSE 133* 89 123* 139* 123*  BUN 25* 23* 20 16 16   CREATININE 1.82* 1.72* 1.53* 1.38* 1.34*  CALCIUM 8.7* 8.3* 7.8* 7.5* 7.7*  MG  --   --  1.6* 1.7 1.8   Liver Function Tests: Recent Labs  Lab 12/12/22 1537 12/15/22 1925 12/16/22 0522 12/17/22 0421 12/18/22 0906  AST 42* 46* 37 30 34  ALT 18 19 17 14 13   ALKPHOS 60 66 56 45 57  BILITOT 1.7* 2.7* 2.2* 2.0* 1.8*  PROT 6.0* 6.8 5.8* 4.8* 4.7*  ALBUMIN 2.2* 2.5* 2.2* 2.2* 1.8*   CBG: No results for input(s): "GLUCAP" in the last 168 hours.  Discharge time spent: {LESS THAN/GREATER AVWU:98119} 30 minutes.  Signed: Pennie Banter, DO [facility] 12/19/2022

## 2022-12-19 NOTE — Telephone Encounter (Signed)
Pt sister was in the office requesting a refill on pt pain medication. She states that pt is getting discharge from  [facility] at 1 today  per sister [facility] did not write a prescription for pt pain  medication  [facility]

## 2022-12-19 NOTE — Telephone Encounter (Signed)
Signed order for Oxycodone refill to [facility].  Please notify them it should be ready to pick up later today. If any issues with rx let me know  Saralyn Pilar, DO [facility] 12/19/2022, 12:00 PM

## 2022-12-19 NOTE — Plan of Care (Signed)
  Problem: Education: Goal: Knowledge of General Education information will improve Description: Including pain rating scale, medication(s)/side effects and non-pharmacologic comfort measures 12/19/2022 1116 by Latanya Maudlin, RN Outcome: Completed/Met 12/19/2022 1017 by Latanya Maudlin, RN Outcome: Progressing   Problem: Health Behavior/Discharge Planning: Goal: Ability to manage health-related needs will improve 12/19/2022 1116 by Latanya Maudlin, RN Outcome: Completed/Met 12/19/2022 1017 by Latanya Maudlin, RN Outcome: Progressing   Problem: Clinical Measurements: Goal: Ability to maintain clinical measurements within normal limits will improve 12/19/2022 1116 by Latanya Maudlin, RN Outcome: Completed/Met 12/19/2022 1017 by Latanya Maudlin, RN Outcome: Progressing Goal: Will remain free from infection 12/19/2022 1116 by Latanya Maudlin, RN Outcome: Completed/Met 12/19/2022 1017 by Latanya Maudlin, RN Outcome: Progressing Goal: Diagnostic test results will improve 12/19/2022 1116 by Latanya Maudlin, RN Outcome: Completed/Met 12/19/2022 1017 by Latanya Maudlin, RN Outcome: Progressing Goal: Respiratory complications will improve 12/19/2022 1116 by Latanya Maudlin, RN Outcome: Completed/Met 12/19/2022 1017 by Latanya Maudlin, RN Outcome: Progressing Goal: Cardiovascular complication will be avoided 12/19/2022 1116 by Latanya Maudlin, RN Outcome: Completed/Met 12/19/2022 1017 by Latanya Maudlin, RN Outcome: Progressing   Problem: Activity: Goal: Risk for activity intolerance will decrease 12/19/2022 1116 by Latanya Maudlin, RN Outcome: Completed/Met 12/19/2022 1017 by Latanya Maudlin, RN Outcome: Progressing   Problem: Nutrition: Goal: Adequate nutrition will be maintained 12/19/2022 1116 by Latanya Maudlin, RN Outcome: Completed/Met 12/19/2022 1017 by Latanya Maudlin, RN Outcome: Progressing   Problem: Coping: Goal: Level of anxiety will decrease 12/19/2022 1116 by Latanya Maudlin, RN Outcome: Completed/Met 12/19/2022 1017 by Latanya Maudlin, RN Outcome: Progressing   Problem: Elimination: Goal: Will not experience complications related to bowel motility 12/19/2022 1116 by Latanya Maudlin, RN Outcome: Completed/Met 12/19/2022 1017 by Latanya Maudlin, RN Outcome: Progressing Goal: Will not experience complications related to urinary retention 12/19/2022 1116 by Latanya Maudlin, RN Outcome: Completed/Met 12/19/2022 1017 by Latanya Maudlin, RN Outcome: Progressing   Problem: Pain Managment: Goal: General experience of comfort will improve 12/19/2022 1116 by Latanya Maudlin, RN Outcome: Completed/Met 12/19/2022 1017 by Latanya Maudlin, RN Outcome: Progressing   Problem: Safety: Goal: Ability to remain free from injury will improve 12/19/2022 1116 by Latanya Maudlin, RN Outcome: Completed/Met 12/19/2022 1017 by Latanya Maudlin, RN Outcome: Progressing   Problem: Skin Integrity: Goal: Risk for impaired skin integrity will decrease 12/19/2022 1116 by Latanya Maudlin, RN Outcome: Completed/Met 12/19/2022 1017 by Latanya Maudlin, RN Outcome: Progressing

## 2022-12-19 NOTE — Plan of Care (Signed)

## 2022-12-19 NOTE — Plan of Care (Signed)
PMT has been following for goals of care.  Patient currently wishes to be a full code.  In to see patient, nursing is reviewing discharge papers at this time.  PMT will sign off at this time.  Please reconsult if needs arise.

## 2022-12-19 NOTE — Addendum Note (Signed)
Addended by: [name] on: 12/19/2022 12:00 PM   Modules accepted: Orders

## 2022-12-19 NOTE — Progress Notes (Signed)
Pt d/c from hospital. All instructions reviewed with pt. Pt voices understanding to given information.

## 2022-12-20 ENCOUNTER — Telehealth: Payer: Self-pay

## 2022-12-20 LAB — AEROBIC/ANAEROBIC CULTURE W GRAM STAIN (SURGICAL/DEEP WOUND)

## 2022-12-20 NOTE — Transitions of Care (Post Inpatient/ED Visit) (Signed)
   12/20/2022  Name: Tom [name] MRN: [id number] DOB: 1965/07/22  Today's TOC FU Call Status: Today's TOC FU Call Status:: Unsuccessul Call (1st Attempt)  Attempted to reach the patient regarding the most recent Inpatient/ED visit.  Follow Up Plan: Additional outreach attempts will be made to reach the patient to complete the Transitions of Care (Post Inpatient/ED visit) call.   Abelino Derrick, MHA [facility]  [facility] Social Worker [phone]

## 2022-12-21 ENCOUNTER — Ambulatory Visit: Payer: Medicaid Other | Admitting: Family Medicine

## 2022-12-21 ENCOUNTER — Encounter: Payer: Self-pay | Admitting: Internal Medicine

## 2022-12-26 ENCOUNTER — Emergency Department: Payer: Medicaid Other

## 2022-12-26 ENCOUNTER — Inpatient Hospital Stay
Admission: EM | Admit: 2022-12-26 | Discharge: 2022-12-30 | DRG: 560 | Disposition: A | Discharge status: Home / Self Care | Payer: Medicaid Other | Attending: Internal Medicine | Admitting: Internal Medicine

## 2022-12-26 ENCOUNTER — Other Ambulatory Visit: Payer: Self-pay

## 2022-12-26 DIAGNOSIS — Z7951 Long term (current) use of inhaled steroids: Secondary | ICD-10-CM

## 2022-12-26 DIAGNOSIS — Z79899 Other long term (current) drug therapy: Secondary | ICD-10-CM

## 2022-12-26 DIAGNOSIS — R188 Other ascites: Secondary | ICD-10-CM | POA: Diagnosis present

## 2022-12-26 DIAGNOSIS — L03115 Cellulitis of right lower limb: Secondary | ICD-10-CM | POA: Diagnosis not present

## 2022-12-26 DIAGNOSIS — N182 Chronic kidney disease, stage 2 (mild): Secondary | ICD-10-CM | POA: Diagnosis present

## 2022-12-26 DIAGNOSIS — N179 Acute kidney failure, unspecified: Secondary | ICD-10-CM | POA: Diagnosis present

## 2022-12-26 DIAGNOSIS — Z888 Allergy status to other drugs, medicaments and biological substances status: Secondary | ICD-10-CM

## 2022-12-26 DIAGNOSIS — E872 Acidosis, unspecified: Secondary | ICD-10-CM | POA: Diagnosis present

## 2022-12-26 DIAGNOSIS — Z96651 Presence of right artificial knee joint: Secondary | ICD-10-CM | POA: Diagnosis not present

## 2022-12-26 DIAGNOSIS — K219 Gastro-esophageal reflux disease without esophagitis: Secondary | ICD-10-CM | POA: Diagnosis present

## 2022-12-26 DIAGNOSIS — G8929 Other chronic pain: Secondary | ICD-10-CM | POA: Diagnosis present

## 2022-12-26 DIAGNOSIS — M79671 Pain in right foot: Secondary | ICD-10-CM

## 2022-12-26 DIAGNOSIS — J449 Chronic obstructive pulmonary disease, unspecified: Secondary | ICD-10-CM | POA: Diagnosis present

## 2022-12-26 DIAGNOSIS — I10 Essential (primary) hypertension: Secondary | ICD-10-CM | POA: Diagnosis present

## 2022-12-26 DIAGNOSIS — T8459XA Infection and inflammatory reaction due to other internal joint prosthesis, initial encounter: Principal | ICD-10-CM | POA: Diagnosis present

## 2022-12-26 DIAGNOSIS — K7210 Chronic hepatic failure without coma: Secondary | ICD-10-CM | POA: Diagnosis present

## 2022-12-26 DIAGNOSIS — D631 Anemia in chronic kidney disease: Secondary | ICD-10-CM | POA: Diagnosis present

## 2022-12-26 DIAGNOSIS — E785 Hyperlipidemia, unspecified: Secondary | ICD-10-CM | POA: Diagnosis present

## 2022-12-26 DIAGNOSIS — R600 Localized edema: Secondary | ICD-10-CM | POA: Diagnosis not present

## 2022-12-26 DIAGNOSIS — Z87891 Personal history of nicotine dependence: Secondary | ICD-10-CM

## 2022-12-26 DIAGNOSIS — M7989 Other specified soft tissue disorders: Secondary | ICD-10-CM | POA: Diagnosis present

## 2022-12-26 DIAGNOSIS — T849XXA Unspecified complication of internal orthopedic prosthetic device, implant and graft, initial encounter: Secondary | ICD-10-CM | POA: Diagnosis present

## 2022-12-26 DIAGNOSIS — M19071 Primary osteoarthritis, right ankle and foot: Secondary | ICD-10-CM | POA: Diagnosis not present

## 2022-12-26 DIAGNOSIS — E871 Hypo-osmolality and hyponatremia: Secondary | ICD-10-CM | POA: Diagnosis present

## 2022-12-26 DIAGNOSIS — I129 Hypertensive chronic kidney disease with stage 1 through stage 4 chronic kidney disease, or unspecified chronic kidney disease: Secondary | ICD-10-CM | POA: Diagnosis present

## 2022-12-26 DIAGNOSIS — Z6834 Body mass index (BMI) 34.0-34.9, adult: Secondary | ICD-10-CM

## 2022-12-26 DIAGNOSIS — J4489 Other specified chronic obstructive pulmonary disease: Secondary | ICD-10-CM | POA: Diagnosis present

## 2022-12-26 DIAGNOSIS — E669 Obesity, unspecified: Secondary | ICD-10-CM | POA: Insufficient documentation

## 2022-12-26 DIAGNOSIS — K7031 Alcoholic cirrhosis of liver with ascites: Secondary | ICD-10-CM | POA: Diagnosis present

## 2022-12-26 DIAGNOSIS — M810 Age-related osteoporosis without current pathological fracture: Secondary | ICD-10-CM | POA: Diagnosis present

## 2022-12-26 DIAGNOSIS — M86671 Other chronic osteomyelitis, right ankle and foot: Secondary | ICD-10-CM | POA: Diagnosis present

## 2022-12-26 DIAGNOSIS — Z5982 Transportation insecurity: Secondary | ICD-10-CM

## 2022-12-26 DIAGNOSIS — Z91030 Bee allergy status: Secondary | ICD-10-CM

## 2022-12-26 DIAGNOSIS — Y838 Other surgical procedures as the cause of abnormal reaction of the patient, or of later complication, without mention of misadventure at the time of the procedure: Secondary | ICD-10-CM | POA: Diagnosis present

## 2022-12-26 DIAGNOSIS — Z981 Arthrodesis status: Secondary | ICD-10-CM

## 2022-12-26 LAB — BASIC METABOLIC PANEL
Anion gap: 7 (ref 5–15)
BUN: 32 mg/dL — ABNORMAL HIGH (ref 6–20)
CO2: 25 mmol/L (ref 22–32)
Calcium: 8.4 mg/dL — ABNORMAL LOW (ref 8.9–10.3)
Chloride: 100 mmol/L (ref 98–111)
Creatinine, Ser: 1.82 mg/dL — ABNORMAL HIGH (ref 0.61–1.24)
GFR, Estimated: 43 mL/min — ABNORMAL LOW (ref >60)
Glucose, Bld: 167 mg/dL — ABNORMAL HIGH (ref 70–99)
Potassium: 4.1 mmol/L (ref 3.5–5.1)
Sodium: 132 mmol/L — ABNORMAL LOW (ref 135–145)

## 2022-12-26 LAB — CBC WITH DIFFERENTIAL/PLATELET
Abs Immature Granulocytes: 0.05 10*3/uL (ref 0.00–0.07)
Basophils Absolute: 0.2 10*3/uL — ABNORMAL HIGH (ref 0.0–0.1)
Basophils Relative: 2 %
Eosinophils Absolute: 0.4 10*3/uL (ref 0.0–0.5)
Eosinophils Relative: 4 %
HCT: 31.3 % — ABNORMAL LOW (ref 39.0–52.0)
Hemoglobin: 10.2 g/dL — ABNORMAL LOW (ref 13.0–17.0)
Immature Granulocytes: 1 %
Lymphocytes Relative: 18 %
Lymphs Abs: 1.6 10*3/uL (ref 0.7–4.0)
MCH: 27.8 pg (ref 26.0–34.0)
MCHC: 32.6 g/dL (ref 30.0–36.0)
MCV: 85.3 fL (ref 80.0–100.0)
Monocytes Absolute: 2.1 10*3/uL — ABNORMAL HIGH (ref 0.1–1.0)
Monocytes Relative: 23 %
Neutro Abs: 4.7 10*3/uL (ref 1.7–7.7)
Neutrophils Relative %: 52 %
Platelets: 195 10*3/uL (ref 150–400)
RBC: 3.67 MIL/uL — ABNORMAL LOW (ref 4.22–5.81)
RDW: 17 % — ABNORMAL HIGH (ref 11.5–15.5)
WBC: 9 10*3/uL (ref 4.0–10.5)
nRBC: 0 % (ref 0.0–0.2)

## 2022-12-26 LAB — LACTIC ACID, PLASMA
Lactic Acid, Venous: 2.4 mmol/L (ref 0.5–1.9)
Lactic Acid, Venous: 2.2 mmol/L (ref 0.5–1.9)

## 2022-12-26 MED ORDER — PIPERACILLIN-TAZOBACTAM 3.375 G IVPB 30 MIN
3.3750 g | Freq: Once | INTRAVENOUS | Status: AC
  Administered 2022-12-26: 3.375 g via INTRAVENOUS
  Filled 2022-12-26: qty 50

## 2022-12-26 MED ORDER — PANTOPRAZOLE SODIUM 40 MG PO TBEC
40.0000 mg | DELAYED_RELEASE_TABLET | Freq: Every day | ORAL | Status: DC
  Administered 2022-12-27 – 2022-12-29 (×3): 40 mg via ORAL
  Filled 2022-12-26 (×4): qty 1

## 2022-12-26 MED ORDER — ADULT MULTIVITAMIN W/MINERALS CH
1.0000 | ORAL_TABLET | Freq: Every day | ORAL | Status: DC
  Administered 2022-12-27 – 2022-12-29 (×3): 1 via ORAL
  Filled 2022-12-26 (×4): qty 1

## 2022-12-26 MED ORDER — ONDANSETRON 4 MG PO TBDP
4.0000 mg | ORAL_TABLET | Freq: Once | ORAL | Status: AC
  Administered 2022-12-26: 4 mg via ORAL
  Filled 2022-12-26: qty 1

## 2022-12-26 MED ORDER — HYDROMORPHONE HCL 1 MG/ML IJ SOLN
1.0000 mg | Freq: Once | INTRAMUSCULAR | Status: AC
  Administered 2022-12-26: 1 mg via INTRAVENOUS
  Filled 2022-12-26: qty 1

## 2022-12-26 MED ORDER — SODIUM CHLORIDE 0.9 % IV SOLN
2.0000 g | Freq: Two times a day (BID) | INTRAVENOUS | Status: DC
  Administered 2022-12-27: 2 g via INTRAVENOUS
  Filled 2022-12-26: qty 12.5

## 2022-12-26 MED ORDER — VANCOMYCIN HCL IN DEXTROSE 1-5 GM/200ML-% IV SOLN
1000.0000 mg | INTRAVENOUS | Status: AC
  Administered 2022-12-26: 1000 mg via INTRAVENOUS
  Filled 2022-12-26: qty 200

## 2022-12-26 MED ORDER — OXYCODONE HCL 5 MG PO TABS
10.0000 mg | ORAL_TABLET | Freq: Four times a day (QID) | ORAL | Status: DC | PRN
  Administered 2022-12-26 – 2022-12-30 (×15): 10 mg via ORAL
  Filled 2022-12-26 (×15): qty 2

## 2022-12-26 MED ORDER — SODIUM CHLORIDE 0.9 % IV SOLN: 2.0000 g | INTRAVENOUS | Status: DC

## 2022-12-26 MED ORDER — MOMETASONE FURO-FORMOTEROL FUM 100-5 MCG/ACT IN AERO
2.0000 | INHALATION_SPRAY | Freq: Two times a day (BID) | RESPIRATORY_TRACT | Status: DC
  Administered 2022-12-27 – 2022-12-29 (×5): 2 via RESPIRATORY_TRACT
  Filled 2022-12-26 (×2): qty 8.8

## 2022-12-26 MED ORDER — ALBUTEROL SULFATE (2.5 MG/3ML) 0.083% IN NEBU: 2.5000 mg | INHALATION_SOLUTION | RESPIRATORY_TRACT | Status: DC | PRN

## 2022-12-26 MED ORDER — OXYCODONE-ACETAMINOPHEN 5-325 MG PO TABS
1.0000 | ORAL_TABLET | Freq: Once | ORAL | Status: AC
  Administered 2022-12-26: 1 via ORAL
  Filled 2022-12-26: qty 1

## 2022-12-26 MED ORDER — VANCOMYCIN HCL 1750 MG/350ML IV SOLN: 1750.0000 mg | INTRAVENOUS | Status: DC

## 2022-12-26 MED ORDER — ACETAMINOPHEN 325 MG PO TABS: 650.0000 mg | ORAL_TABLET | Freq: Four times a day (QID) | ORAL | Status: DC | PRN

## 2022-12-26 MED ORDER — HYDROXYZINE HCL 25 MG PO TABS: 25.0000 mg | ORAL_TABLET | Freq: Three times a day (TID) | ORAL | Status: DC | PRN

## 2022-12-26 MED ORDER — LACTULOSE 10 GM/15ML PO SOLN
10.0000 g | Freq: Every day | ORAL | Status: DC
  Administered 2022-12-27 – 2022-12-29 (×3): 10 g via ORAL
  Filled 2022-12-26 (×4): qty 30

## 2022-12-26 MED ORDER — SENNOSIDES-DOCUSATE SODIUM 8.6-50 MG PO TABS: 1.0000 | ORAL_TABLET | Freq: Every evening | ORAL | Status: DC | PRN

## 2022-12-26 MED ORDER — SODIUM CHLORIDE 0.9 % IV BOLUS
500.0000 mL | Freq: Once | INTRAVENOUS | Status: AC
  Administered 2022-12-26: 500 mL via INTRAVENOUS

## 2022-12-26 MED ORDER — VANCOMYCIN HCL 10 G IV SOLR: 1.0000 g | Freq: Once | INTRAVENOUS | Status: DC

## 2022-12-26 MED ORDER — ACETAMINOPHEN 650 MG RE SUPP: 650.0000 mg | Freq: Four times a day (QID) | RECTAL | Status: DC | PRN

## 2022-12-26 MED ORDER — HYDROMORPHONE HCL 1 MG/ML IJ SOLN: 0.5000 mg | INTRAMUSCULAR | Status: AC | PRN

## 2022-12-26 MED ORDER — VANCOMYCIN HCL 1500 MG/300ML IV SOLN
1500.0000 mg | Freq: Once | INTRAVENOUS | Status: AC
  Administered 2022-12-26: 1500 mg via INTRAVENOUS
  Filled 2022-12-26: qty 300

## 2022-12-26 MED ORDER — FAMOTIDINE 20 MG PO TABS
20.0000 mg | ORAL_TABLET | Freq: Every day | ORAL | Status: DC
  Administered 2022-12-27 – 2022-12-29 (×3): 20 mg via ORAL
  Filled 2022-12-26 (×3): qty 1

## 2022-12-26 MED ORDER — ENOXAPARIN SODIUM 60 MG/0.6ML IJ SOSY
60.0000 mg | PREFILLED_SYRINGE | INTRAMUSCULAR | Status: DC
  Administered 2022-12-27 – 2022-12-29 (×3): 60 mg via SUBCUTANEOUS
  Filled 2022-12-26 (×4): qty 0.6

## 2022-12-26 MED ORDER — HYDRALAZINE HCL 20 MG/ML IJ SOLN: 5.0000 mg | Freq: Three times a day (TID) | INTRAMUSCULAR | Status: DC | PRN

## 2022-12-26 MED ORDER — FOLIC ACID 1 MG PO TABS
1.0000 mg | ORAL_TABLET | Freq: Every day | ORAL | Status: DC
  Administered 2022-12-27 – 2022-12-29 (×3): 1 mg via ORAL
  Filled 2022-12-26 (×4): qty 1

## 2022-12-26 NOTE — Assessment & Plan Note (Addendum)
With elevated lactic acid Blood cultures x 2 are in process Continue with cefepime and vancomycin per pharmacy

## 2022-12-26 NOTE — Assessment & Plan Note (Signed)
Chronic Will check INR and LFT to assess the MELD-Na score

## 2022-12-26 NOTE — ED Triage Notes (Signed)
Pt presents to ED with c/o of R leg pain, PCP sent pt here for a infection work up. Pt denies fevers or chills. Pt does have a walking boot in place, R calf is significantly swollen. Pt denies any new injury.

## 2022-12-26 NOTE — Assessment & Plan Note (Addendum)
Appears to be chronic AM team to evaluate patient's leg to ensure appropriate response to ABX, if patient's leg does not improve, a.m. team to consider MRI for abscess versus osteomyelitis Patient is status post vancomycin, Zosyn per pharmacy We will continue with cefepime and vancomycin per pharmacy

## 2022-12-26 NOTE — Hospital Course (Addendum)
[name] is a 57 year old male with history of hypertension, hyperlipidemia, end-stage liver disease, alcoholic cirrhosis of the liver, COPD, GERD, history of right foot pain, history of complication associated with orthopedic device, hyponatremia, who presents to the emergency department for chief concerns of right calf swelling and pain.  Vitals in the ED showed temperature of 98.3, respiration rate of 18, heart rate 92, blood pressure 144/90, SpO2 97% on room air.  Serum sodium is 132, potassium 4.1, chloride 100, bicarb 25, BUN of 32, serum creatinine 1.82, EGFR 43, nonfasting glucose 167, WBC 9.0, hemoglobin 10.2, platelets of 195.  Lactic acid 2.5, on repeat was 2.2.  ED treatment: Dilaudid 1 mg IV one-time dose, ondansetron 4 mg p.o. one-time dose, vancomycin, oxycodone 5-325 mg, ondansetron, sodium chloride 500 mL bolus.

## 2022-12-26 NOTE — Assessment & Plan Note (Signed)
Hydralazine 5 mg IV every 8 hours as needed for SBP 175, 4 days ordered

## 2022-12-26 NOTE — Assessment & Plan Note (Signed)
Check LFTs and PT, INR on admission

## 2022-12-26 NOTE — ED Provider Notes (Signed)
[facility] Provider Note    Event Date/Time   First MD Initiated Contact with Patient 12/26/22 1934     (approximate)   History   Leg Pain   HPI  Tom [name] is a 57 y.o. male with a history of end-stage liver disease, COPD, asthma, hypertension, dyslipidemia, osteoarthritis, and osteoporosis who presents with right foot and lower leg pain, swelling, and redness that has worsened over the last several days.  The patient has a history of chronic right lower extremity injury and pain as well as orthopedic intervention.  The patient states that he was seen by his podiatrist at [facility] today and told he likely had an infection in the right leg and was told to go to the nearest emergency department for IV antibiotics.  The patient states that he is trying to transition his care over to [facility] and decided to come here where he has been taking care of previously.  The patient denies any fever or chills.  He has no vomiting or diarrhea.  He denies any acute injury to the foot or leg.  I reviewed the past medical records.  The patient was admitted to the hospitalist service at the end of last month and discharged on 7/1.  He presented with worsening bilateral lower extremity edema and ascites.  He had a paracentesis removing 13.9 L of fluid.  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 --      Excl. in GC? --     Most recent vital signs: Vitals:   12/26/22 1955 12/26/22 2154  BP: (!) 144/90 (!) 132/95  Pulse: 92 93  Resp: 18 18  Temp: 98.3 F (36.8 C)   SpO2: 97% 96%     General: Awake, no distress.  CV:  Good peripheral perfusion.  Resp:  Normal effort.  Abd:  No distention.  Other:  Diffuse edema, erythema, induration, abnormal warmth to the right foot and lower leg up to the proximal shin.  1 cm superficial appearing wound to the heel.  Otherwise no open wounds or lesions.   ED Results / Procedures / Treatments   Labs (all labs ordered are listed, but only abnormal results are displayed) Labs Reviewed  CBC WITH DIFFERENTIAL/PLATELET - Abnormal; Notable for the following components:      Result Value   RBC 3.67 (*)    Hemoglobin 10.2 (*)  HCT 31.3 (*)    RDW 17.0 (*)    Monocytes Absolute 2.1 (*)    Basophils Absolute 0.2 (*)    All other components within normal limits  BASIC METABOLIC PANEL - Abnormal; Notable for the following components:   Sodium 132 (*)    Glucose, Bld 167 (*)    BUN 32 (*)    Creatinine, Ser 1.82 (*)    Calcium 8.4 (*)    GFR, Estimated 43 (*)    All other components within normal limits  LACTIC ACID, PLASMA - Abnormal; Notable for the following components:   Lactic Acid, Venous 2.4 (*)    All other components within normal limits  LACTIC ACID, PLASMA - Abnormal; Notable for the following components:   Lactic Acid, Venous 2.2 (*)    All other components within normal limits  CULTURE, BLOOD (ROUTINE X 2)  CULTURE, BLOOD (ROUTINE X 2)  BASIC METABOLIC PANEL  CBC  PROTIME-INR  HEPATIC FUNCTION PANEL     EKG     RADIOLOGY  XR R knee: I independently viewed and interpreted the images; there is  no acute fracture.  XR R foot:  IMPRESSION:  1. No acute fracture of the right foot.  2. Marked generalized soft tissue edema.  3. 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 Postsurgical change in  the distal tibia and talocalcaneal fusion. Lucency adjacent to the  talocalcaneal screws is also unchanged. Differential considerations  are loosening and infection.    PROCEDURES:  Critical Care performed: No  Procedures   MEDICATIONS ORDERED IN ED: Medications  oxyCODONE (Oxy IR/ROXICODONE) immediate release tablet 10 mg (10 mg Oral Given 12/26/22 2234)  enoxaparin (LOVENOX) injection 60 mg (has no administration in time range)  senna-docusate (Senokot-S) tablet 1 tablet (has no administration in time range)  acetaminophen (TYLENOL) tablet 650 mg (has no administration in time range)    Or  acetaminophen (TYLENOL) suppository 650 mg (has no administration in time range)  HYDROmorphone (DILAUDID) injection 0.5 mg (has no administration in time range)  cefTRIAXone (ROCEPHIN) 2 g in sodium chloride 0.9 % 100 mL IVPB (has no administration in time range)  ondansetron (ZOFRAN-ODT) disintegrating tablet 4 mg (4 mg Oral Given 12/26/22 1708)  oxyCODONE-acetaminophen (PERCOCET/ROXICET) 5-325 MG per tablet 1 tablet (1 tablet Oral Given 12/26/22 1708)  sodium chloride 0.9 % bolus 500 mL (0 mLs Intravenous Stopped 12/26/22 2126)  HYDROmorphone (DILAUDID) injection 1 mg (1 mg Intravenous Given 12/26/22 2050)  piperacillin-tazobactam (ZOSYN) IVPB 3.375 g (0 g Intravenous Stopped 12/26/22 2121)  vancomycin (VANCOCIN) IVPB 1000 mg/200 mL premix (0 mg Intravenous Stopped 12/26/22 2222)     IMPRESSION / MDM / ASSESSMENT AND PLAN / ED COURSE  I reviewed the triage vital signs and the nursing notes.  57 year old male with PMH as noted above presents with worsening pain, redness, and swelling to the right foot and lower leg.  He saw podiatry at  [facility] today and was told to come to the ER due to concern for infection.  Differential diagnosis includes, but is not limited to, cellulitis, dependent edema, venous stasis, lymphedema, less likely osteomyelitis.  X-ray showed nonspecific findings.  I have ordered vancomycin and Zosyn as well as fluids.  Initial lab reveals normal WBC count and mildly elevated lactate.  Patient's presentation is most consistent with acute presentation with potential threat to life or bodily function.  ----------------------------------------- 10:19 PM on 12/26/2022 -----------------------------------------  I consulted and  discussed case with Dr. Sedalia Muta from the hospitalist service; based on our discussion she agrees to evaluate the patient for admission.   FINAL CLINICAL IMPRESSION(S) / ED DIAGNOSES   Final diagnoses:  Cellulitis of right lower extremity     Rx / DC Orders   ED Discharge Orders     None        Note:  This document was prepared using Dragon voice recognition software and may include unintentional dictation errors.    Dionne Bucy, MD 12/26/22 2239

## 2022-12-26 NOTE — Assessment & Plan Note (Signed)
Patient reports that his abdomen does not appear to be as distended as before He does not want to be evaluated tonight for possible ascites and paracentesis AM team to reevaluate patient at bedside for possible paracentesis as he will likely have recurrent abdominal ascites in setting of end-stage liver disease

## 2022-12-26 NOTE — Assessment & Plan Note (Signed)
Does not appear to be in acute exacerbation Home albuterol nebulizer every 4 hours as needed for wheezing and shortness of breath and long-acting inhaler 2 puffs twice daily resumed on admission

## 2022-12-26 NOTE — ED Notes (Signed)
Patient provided box meal and drink

## 2022-12-26 NOTE — Progress Notes (Signed)
PHARMACIST - PHYSICIAN COMMUNICATION  CONCERNING:  Enoxaparin (Lovenox) for DVT Prophylaxis    RECOMMENDATION: Patient was prescribed enoxaprin 40mg  q24 hours for VTE prophylaxis.   Filed Weights   12/26/22 1952  Weight: 116.1 kg (256 lb)    Body mass index is 34.72 kg/m.  Estimated Creatinine Clearance: 58.2 mL/min (A) (by C-G formula based on SCr of 1.82 mg/dL (H)).   Based on [facility] policy patient is candidate for enoxaparin 0.5mg /kg TBW SQ every 24 hours based on BMI being >30.  DESCRIPTION: Pharmacy has adjusted enoxaparin dose per [facility] policy.  Patient is now receiving enoxaparin 0.5 mg/kg every 24 hours   Otelia Sergeant, PharmD, [facility] 12/26/2022 10:29 PM

## 2022-12-26 NOTE — Assessment & Plan Note (Addendum)
Baseline serum creatinine is 1.34-1.53 Patient did have serum creatinine at 1.82 11 days ago during admission We will check INR and LFT to calculate MELD score Patient is status post sodium chloride 500 mL bolus Low clinical suspicion for hepatorenal syndrome on admission Recheck BMP in the a.m.

## 2022-12-26 NOTE — H&P (Addendum)
History and Physical   Tom [name]:[id number] DOB: 1966/06/01 DOA: 12/26/2022  PCP: [name], DO  Outpatient Specialists: [facility] gastroenterology Patient coming from: PCP  I have personally briefly reviewed patient's old medical records in [facility] EMR.  Chief Concern: Right leg pain  HPI: [name] is a 57 year old male with history of hypertension, hyperlipidemia, end-stage liver disease, alcoholic cirrhosis of the liver, COPD, GERD, history of right foot pain, history of complication associated with orthopedic device, hyponatremia, who presents to the emergency department for chief concerns of right calf swelling and pain.  Vitals in the ED showed temperature of 98.3, respiration rate of 18, heart rate 92, blood pressure 144/90, SpO2 97% on room air.  Serum sodium is 132, potassium 4.1, chloride 100, bicarb 25, BUN of 32, serum creatinine 1.82, EGFR 43, nonfasting glucose 167, WBC 9.0, hemoglobin 10.2, platelets of 195.  Lactic acid 2.5, on repeat was 2.2.  ED treatment: Dilaudid 1 mg IV one-time dose, ondansetron 4 mg p.o. one-time dose, vancomycin, oxycodone 5-325 mg, ondansetron, sodium chloride 500 mL bolus. ------------------------- At bedside, patient was able to tell me his name, age, location, current calendar year.  He reports his right lower leg has been swollen for a long time and he just noticed that it turned red and became more painful today.  He denies trauma to his leg.  He denies fever, chills, chest pain, shortness of breath, dysuria, hematuria, changes to his bowel movements.  He endorses compliance with his lactulose therefore has a lot of loose stools daily.  Social history: He lives with his girlfriend of 20+ years.  He denies tobacco, EtOH, recreational drug use.  His last alcoholic drink was 1 year ago.  He is disabled.  ROS: Constitutional: no weight change, no fever ENT/Mouth: no sore throat, no rhinorrhea Eyes:  no eye pain, no vision changes Cardiovascular: no chest pain, no dyspnea,  + right lower extremity swelling, no palpitations Respiratory: no cough, no sputum, no wheezing Gastrointestinal: no nausea, no vomiting, no diarrhea, no constipation Genitourinary: no urinary incontinence, no dysuria, no hematuria Musculoskeletal: no arthralgias, no myalgias Skin: no skin lesions, no pruritus, + redness of the right lower extremity Neuro: + weakness, no loss of consciousness, no syncope Psych: no anxiety, no depression, + decrease appetite Heme/Lymph: no bruising, no bleeding  ED Course: Discussed with emergency medicine provider, patient requiring hospitalization for chief concerns of cellulitis of the right lower extremity.  Assessment/Plan  Principal Problem:   Cellulitis of right foot Active Problems:   Complication associated with orthopedic device (HCC)   Hyponatremia   Ascites   AKI (acute kidney injury) (HCC)   Essential hypertension   Dyslipidemia   End stage liver disease (HCC)   Alcoholic cirrhosis of liver with ascites (HCC)   COPD with asthma   Assessment and Plan:  * Cellulitis of right foot With elevated lactic acid Blood cultures x 2 are in process Continue with cefepime and vancomycin per pharmacy  Hyponatremia Chronic Will check INR and LFT to assess the MELD-Na score  Complication associated with orthopedic device (HCC) Appears to be chronic AM team to evaluate patient's leg to ensure appropriate response to ABX, if patient's leg does not improve, a.m. team to consider MRI for abscess versus osteomyelitis Patient is status post vancomycin, Zosyn per pharmacy We will continue with cefepime and vancomycin per pharmacy  Ascites Patient reports that his abdomen does not appear to be as distended as before He does not want  to be evaluated tonight for possible ascites and paracentesis AM team to reevaluate patient at bedside for possible paracentesis as he will  likely have recurrent abdominal ascites in setting of end-stage liver disease  AKI (acute kidney injury) (HCC) Baseline serum creatinine is 1.34-1.53 Patient did have serum creatinine at 1.82 11 days ago during admission We will check INR and LFT to calculate MELD score Patient is status post sodium chloride 500 mL bolus Low clinical suspicion for hepatorenal syndrome on admission Recheck BMP in the a.m.  Essential hypertension Hydralazine 5 mg IV every 8 hours as needed for SBP 175, 4 days ordered  End stage liver disease (HCC) Check LFTs and PT, INR on admission  Dyslipidemia Home atorvastatin 40 mg daily not resumed on admission pending LFTs  COPD with asthma Does not appear to be in acute exacerbation Home albuterol nebulizer every 4 hours as needed for wheezing and shortness of breath and long-acting inhaler 2 puffs twice daily resumed on admission  Chart reviewed.   Hospitalization on 12/15/2022- 12/19/2022: Patient was admitted for ascites and was found to have hypomagnesemia.  IR was consulted for paracentesis, 13.9 L removed on 12/16/2022.  Prior paracentesis on 12/05/2022, 6.9 L were removed.  Cell counts ruled out SBP. Patient is a candidate for TIPS and IR, Dr. Elby Showers was notified.  DVT prophylaxis: Enoxaparin Code Status: Full code Diet: Heart healthy Family Communication: No Disposition Plan: Pending clinical course, guarded prognosis Consults called: None at this time Admission status: Telemetry medical, observation  Past Medical History:  Diagnosis Date   Arthritis    Asthma    COPD (chronic obstructive pulmonary disease) (HCC)    Hyperlipidemia    Hypertension    Hypokalemia 12/04/2022   Hypomagnesemia 12/04/2022   Hypomagnesemia 12/04/2022   Osteoporosis    Past Surgical History:  Procedure Laterality Date   ANKLE FUSION Right 2017   BACK SURGERY     TOTAL KNEE ARTHROPLASTY Right 04/2012   Social History:  reports that he quit smoking about 7 years  ago. His smoking use included cigarettes. He has a 39.00 pack-year smoking history. He has never used smokeless tobacco. He reports current drug use. Drug: Marijuana. He reports that he does not drink alcohol.  Allergies  Allergen Reactions   Alfuzosin Other (See Comments)    Dizziness    Amlodipine Nausea And Vomiting   Bee Venom Swelling   Cymbalta [Duloxetine Hcl] Other (See Comments)    Emesis    Duloxetine Other (See Comments)    Emesis   Lisinopril    Flomax [Tamsulosin Hcl] Itching   History reviewed. No pertinent family history. Family history: Family history reviewed and not pertinent.  Prior to Admission medications   Medication Sig Start Date End Date Taking? Authorizing Provider  albuterol (PROVENTIL) (2.5 MG/3ML) 0.083% nebulizer solution Take 3 mLs (2.5 mg total) by nebulization every 4 (four) hours as needed for wheezing or shortness of breath. 11/16/22   Georgeann Oppenheim, Jonelle Sports, MD  atorvastatin (LIPITOR) 40 MG tablet Take 1 tablet (40 mg total) by mouth daily. 03/21/22   [name], [name], DO  budesonide-formoterol (SYMBICORT) 80-4.5 MCG/ACT inhaler Take 2 puffs first thing in am and then another 2 puffs about 12 hours later. 03/21/22   [name], [name], DO  diclofenac Sodium (VOLTAREN) 1 % GEL Apply 4 g topically 4 (four) times daily. 11/16/22   [name], MD  Docusate Sodium (DSS) 100 MG CAPS TAKE 1 CAPSULE (100 MG TOTAL) BY MOUTH TWO (2)  TIMES A DAY    [provider]  EPINEPHrine (EPIPEN 2-PAK) 0.3 mg/0.3 mL IJ SOAJ injection Inject 0.3 mg into the muscle as needed for anaphylaxis. 12/16/21   [name], [name], DO  famotidine (PEPCID) 20 MG tablet Take 1 tablet (20 mg total) by mouth daily after supper. One after supper 03/21/22   [name], DO  folic acid (FOLVITE) 1 MG tablet Take 1 tablet (1 mg total) by mouth daily. 11/03/22   [name], [name], DO  furosemide (LASIX) 20 MG tablet Take 1 tablet (20 mg total) by  mouth daily. 10/20/22   [name], DO  Homeopathic Products (LIVER SUPPORT SL) Place under the tongue.    [provider]  hydrOXYzine (VISTARIL) 25 MG capsule Take 1 capsule (25 mg total) by mouth every 8 (eight) hours as needed. 10/10/22   Althea Charon, [name], DO  lactulose (CHRONULAC) 10 GM/15ML solution SMARTSIG:15 Milliliter(s) By Mouth Every Other Day 10/20/22   [name], DO  lidocaine (LIDODERM) 5 % Place 1 patch onto the skin daily.    [provider]  magnesium oxide (MAG-OX) 400 (240 Mg) MG tablet Take 2 tablets by mouth 2 (two) times daily. 09/14/22   [provider]  Multiple Vitamin (MULTIVITAMIN) capsule Take 1 capsule by mouth daily.    [provider]  ondansetron (ZOFRAN-ODT) 4 MG disintegrating tablet Take 1 tablet (4 mg total) by mouth every 8 (eight) hours as needed for nausea or vomiting. 10/10/22   Althea Charon, [name], DO  Oxycodone HCl 10 MG TABS Take 1 tablet (10 mg total) by mouth 4 (four) times daily as needed (modereate to severe pain). 12/19/22   [name], [name], DO  pantoprazole (PROTONIX) 40 MG tablet Take 1 tablet (40 mg total) by mouth daily before breakfast. 06/01/22   Althea Charon, [name], DO  spironolactone (ALDACTONE) 25 MG tablet Take 1 tablet (25 mg total) by mouth daily. 10/20/22   [name], DO   Physical Exam: Vitals:   12/26/22 1646 12/26/22 1952 12/26/22 1955 12/26/22 2154  BP: 123/75  (!) 144/90 (!) 132/95  Pulse: (!) 101  92 93  Resp: 19  18 18   Temp: 98.7 F (37.1 C)  98.3 F (36.8 C)   TempSrc: Oral  Oral   SpO2: 93%  97% 96%  Weight:  116.1 kg    Height:  6' (1.829 m)     Constitutional: appears older than chronological age, NAD, calm Eyes: PERRL, lids and conjunctivae normal ENMT: Mucous membranes are moist. Posterior pharynx clear of any exudate or lesions. Age-appropriate dentition. Hearing appropriate Neck: normal, supple, no masses, no  thyromegaly Respiratory: clear to auscultation bilaterally, no wheezing, no crackles. Normal respiratory effort. No accessory muscle use.  Cardiovascular: Regular rate and rhythm, no murmurs / rubs / gallops. No extremity edema. 2+ pedal pulses. No carotid bruits.  Abdomen: Distended abdomen, no tenderness, no masses palpated, no hepatosplenomegaly. Bowel sounds positive.  Musculoskeletal: no clubbing / cyanosis. No joint deformity upper and lower extremities. Good ROM, no contractures, no atrophy. Normal muscle tone.  Skin: no rashes, lesions, ulcers. No induration Neurologic: Sensation intact. Strength 5/5 in all 4.  Psychiatric: Does not have insight on patient's own medical condition and prognosis. Alert and oriented x 3.  Depressed mood.   EKG: Ordered  X-rays on Admission: I personally reviewed and I agree with radiologist reading as below.  DG Foot Complete Right  Result Date: 12/26/2022 CLINICAL DATA:  Fall.  Right  knee and foot pain. EXAM: RIGHT FOOT COMPLETE - 3+ VIEW COMPARISON:  CT 11/15/2022 FINDINGS: Again seen periosteal reaction involving the distal tibia extending to the ankle joint, no significant change from prior CT. Portions of screws in the distal tibia. Two screws traverse the talocalcaneal joint. Lucency adjacent to 1 of the talocalcaneal screws measures up to 4 cm. This is also unchanged from prior exam. Moderate talonavicular spurring. Mild degenerative change of the first metatarsal phalangeal joint. There is no evidence of acute fracture. Marked generalized soft tissue edema. Scattered punctate densities in the soft tissues, chronic. IMPRESSION: 1. No acute fracture of the right foot. 2. Marked generalized soft tissue edema. 3. 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 Postsurgical change in the distal tibia and talocalcaneal fusion. Lucency adjacent to the talocalcaneal screws is also unchanged.  Differential considerations are loosening and infection. Electronically Signed   By: Narda Rutherford M.D.   On: 12/26/2022 18:16   DG Knee Complete 4 Views Right  Result Date: 12/26/2022 CLINICAL DATA:  Fall, right knee and foot pain. EXAM: RIGHT KNEE - COMPLETE 4+ VIEW COMPARISON:  None Available. FINDINGS: Right knee arthroplasty. No acute or periprosthetic fracture. There is no periprosthetic lucency. No joint effusion. Prior patellar resurfacing. No significant knee joint effusion. Generalized soft tissue edema. No soft tissue gas. IMPRESSION: 1. Right knee arthroplasty without complication. No acute fracture. 2. Soft tissue edema. Electronically Signed   By: Narda Rutherford M.D.   On: 12/26/2022 18:13    Labs on Admission: I have personally reviewed following labs  CBC: Recent Labs  Lab 12/26/22 1705  WBC 9.0  NEUTROABS 4.7  HGB 10.2*  HCT 31.3*  MCV 85.3  PLT 195   Basic Metabolic Panel: Recent Labs  Lab 12/26/22 1705  NA 132*  K 4.1  CL 100  CO2 25  GLUCOSE 167*  BUN 32*  CREATININE 1.82*  CALCIUM 8.4*   GFR: Estimated Creatinine Clearance: 58.2 mL/min (A) (by C-G formula based on SCr of 1.82 mg/dL (H)).  Urine analysis:    Component Value Date/Time   COLORURINE YELLOW (A) 11/15/2022 1810   APPEARANCEUR HAZY (A) 11/15/2022 1810   LABSPEC 1.020 11/15/2022 1810   PHURINE 6.0 11/15/2022 1810   GLUCOSEU NEGATIVE 11/15/2022 1810   HGBUR LARGE (A) 11/15/2022 1810   BILIRUBINUR NEGATIVE 11/15/2022 1810   BILIRUBINUR Negative 08/24/2021 1516   KETONESUR NEGATIVE 11/15/2022 1810   PROTEINUR 30 (A) 11/15/2022 1810   UROBILINOGEN 0.2 08/24/2021 1516   NITRITE NEGATIVE 11/15/2022 1810   LEUKOCYTESUR NEGATIVE 11/15/2022 1810   This document was prepared using Dragon Voice Recognition software and may include unintentional dictation errors.  Dr. Sedalia Muta [facility]  If 7PM-7AM, please contact overnight-coverage provider If 7AM-7PM, please contact day attending  provider [email or web address]  12/26/2022, 11:01 PM

## 2022-12-26 NOTE — Assessment & Plan Note (Signed)
Home atorvastatin 40 mg daily not resumed on admission pending LFTs

## 2022-12-26 NOTE — Progress Notes (Signed)
Pharmacy Antibiotic Note  Tom [name] is a 57 y.o. male admitted on 12/26/2022 with osteomyelitis.  Pharmacy has been consulted for Cefepime & Vancomycin dosing.  Plan: Cefepime 2gm q12hr per indication & renal fxn.  Pt given Vancomycin 2500 mg bolus. Vancomycin 1750 mg IV Q 24 hrs. Goal AUC 400-550. Expected AUC: 468.3 SCr used: 1.82, Vd used: 0.72  Pharmacy will continue to follow and will adjust abx dosing whenever warranted.  Temp (24hrs), Avg:98.5 F (36.9 C), Min:98.3 F (36.8 C), Max:98.7 F (37.1 C)   Recent Labs  Lab 12/26/22 1705 12/26/22 1950  WBC 9.0  --   CREATININE 1.82*  --   LATICACIDVEN 2.4* 2.2*    Estimated Creatinine Clearance: 58.2 mL/min (A) (by C-G formula based on SCr of 1.82 mg/dL (H)).    Allergies  Allergen Reactions   Alfuzosin Other (See Comments)    Dizziness    Amlodipine Nausea And Vomiting   Bee Venom Swelling   Cymbalta [Duloxetine Hcl] Other (See Comments)    Emesis    Duloxetine Other (See Comments)    Emesis   Lisinopril    Flomax [Tamsulosin Hcl] Itching    Antimicrobials this admission: 7/08 Zosyn >> x 1 dose 7/09 Cefepime >>  7/08 Vancomycin >>   Microbiology results: 07/08 BCx: Pending  Thank you for allowing pharmacy to be a part of this patient's care.  Otelia Sergeant, PharmD, [facility] 12/26/2022 11:02 PM

## 2022-12-26 NOTE — ED Provider Triage Note (Signed)
Emergency Medicine Provider Triage Evaluation Note  Tom [name], a 57 y.o. male with history of cirrhosis, CKD, COPD, HTN, right total knee replacement, right heel fusion, was evaluated in triage.  Pt complains of concern for cellulitis to the RLE.  Patient presents to the ED from the office of [facility] orthopedics where he was evaluated.  He is endorsing reseller to the leg as well as some erythema to the anterior lower leg.  Patient denies any fevers, chills or sweats.  Review of Systems  Positive: RLE edema/erythema; right toe/knee pain s/p fall Negative: FCS  Physical Exam  BP 123/75 (BP Location: Right Arm)   Pulse (!) 101   Temp 98.7 F (37.1 C) (Oral)   Resp 19   SpO2 93%  Gen:   Awake, no distress  NAD Resp:  Normal effort CTA MSK:   Moves extremities without difficulty RLE with 2+ pitting edema and ant shin erythema. Chronic subcentimeter heel wound. No open lesions, weeping, purulence Other:    Medical Decision Making  Medically screening exam initiated at 5:02 PM.  Appropriate orders placed.  [name] was informed that the remainder of the evaluation will be completed by another provider, this initial triage assessment does not replace that evaluation, and the importance of remaining in the ED until their evaluation is complete.  Patient to the ED for evaluation RLE edema and erythema concerning for cellulitis at the advice of his orthopedic specialist.   [name], PA-C 12/26/22 1709

## 2022-12-27 ENCOUNTER — Encounter: Payer: Self-pay | Admitting: Obstetrics and Gynecology

## 2022-12-27 DIAGNOSIS — E669 Obesity, unspecified: Secondary | ICD-10-CM | POA: Insufficient documentation

## 2022-12-27 DIAGNOSIS — K7460 Unspecified cirrhosis of liver: Secondary | ICD-10-CM | POA: Diagnosis not present

## 2022-12-27 DIAGNOSIS — M79671 Pain in right foot: Secondary | ICD-10-CM | POA: Diagnosis not present

## 2022-12-27 DIAGNOSIS — N182 Chronic kidney disease, stage 2 (mild): Secondary | ICD-10-CM | POA: Diagnosis not present

## 2022-12-27 DIAGNOSIS — Z6834 Body mass index (BMI) 34.0-34.9, adult: Secondary | ICD-10-CM | POA: Diagnosis not present

## 2022-12-27 DIAGNOSIS — Z981 Arthrodesis status: Secondary | ICD-10-CM | POA: Diagnosis not present

## 2022-12-27 DIAGNOSIS — K219 Gastro-esophageal reflux disease without esophagitis: Secondary | ICD-10-CM | POA: Diagnosis not present

## 2022-12-27 DIAGNOSIS — G8929 Other chronic pain: Secondary | ICD-10-CM | POA: Diagnosis not present

## 2022-12-27 DIAGNOSIS — Z87891 Personal history of nicotine dependence: Secondary | ICD-10-CM | POA: Diagnosis not present

## 2022-12-27 DIAGNOSIS — I129 Hypertensive chronic kidney disease with stage 1 through stage 4 chronic kidney disease, or unspecified chronic kidney disease: Secondary | ICD-10-CM | POA: Diagnosis not present

## 2022-12-27 DIAGNOSIS — D631 Anemia in chronic kidney disease: Secondary | ICD-10-CM | POA: Diagnosis not present

## 2022-12-27 DIAGNOSIS — M7989 Other specified soft tissue disorders: Secondary | ICD-10-CM | POA: Diagnosis not present

## 2022-12-27 DIAGNOSIS — K7210 Chronic hepatic failure without coma: Secondary | ICD-10-CM | POA: Diagnosis not present

## 2022-12-27 DIAGNOSIS — E871 Hypo-osmolality and hyponatremia: Secondary | ICD-10-CM | POA: Diagnosis not present

## 2022-12-27 DIAGNOSIS — Z96651 Presence of right artificial knee joint: Secondary | ICD-10-CM | POA: Diagnosis not present

## 2022-12-27 DIAGNOSIS — E872 Acidosis, unspecified: Secondary | ICD-10-CM | POA: Diagnosis not present

## 2022-12-27 DIAGNOSIS — M19071 Primary osteoarthritis, right ankle and foot: Secondary | ICD-10-CM | POA: Diagnosis not present

## 2022-12-27 DIAGNOSIS — T8459XA Infection and inflammatory reaction due to other internal joint prosthesis, initial encounter: Secondary | ICD-10-CM | POA: Diagnosis not present

## 2022-12-27 DIAGNOSIS — R188 Other ascites: Secondary | ICD-10-CM | POA: Diagnosis not present

## 2022-12-27 DIAGNOSIS — N179 Acute kidney failure, unspecified: Secondary | ICD-10-CM | POA: Diagnosis not present

## 2022-12-27 DIAGNOSIS — M86671 Other chronic osteomyelitis, right ankle and foot: Secondary | ICD-10-CM | POA: Diagnosis not present

## 2022-12-27 DIAGNOSIS — Z888 Allergy status to other drugs, medicaments and biological substances status: Secondary | ICD-10-CM | POA: Diagnosis not present

## 2022-12-27 DIAGNOSIS — Z91030 Bee allergy status: Secondary | ICD-10-CM | POA: Diagnosis not present

## 2022-12-27 DIAGNOSIS — E785 Hyperlipidemia, unspecified: Secondary | ICD-10-CM | POA: Diagnosis not present

## 2022-12-27 DIAGNOSIS — J4489 Other specified chronic obstructive pulmonary disease: Secondary | ICD-10-CM | POA: Diagnosis not present

## 2022-12-27 DIAGNOSIS — L03115 Cellulitis of right lower limb: Secondary | ICD-10-CM | POA: Diagnosis not present

## 2022-12-27 DIAGNOSIS — K7031 Alcoholic cirrhosis of liver with ascites: Secondary | ICD-10-CM | POA: Diagnosis present

## 2022-12-27 DIAGNOSIS — Y838 Other surgical procedures as the cause of abnormal reaction of the patient, or of later complication, without mention of misadventure at the time of the procedure: Secondary | ICD-10-CM | POA: Diagnosis present

## 2022-12-27 LAB — HEPATIC FUNCTION PANEL
ALT: 18 U/L (ref 0–44)
AST: 41 U/L (ref 15–41)
Albumin: 2 g/dL — ABNORMAL LOW (ref 3.5–5.0)
Alkaline Phosphatase: 66 U/L (ref 38–126)
Bilirubin, Direct: 0.7 mg/dL — ABNORMAL HIGH (ref 0.0–0.2)
Indirect Bilirubin: 1.8 mg/dL — ABNORMAL HIGH (ref 0.3–0.9)
Total Bilirubin: 2.5 mg/dL — ABNORMAL HIGH (ref 0.3–1.2)
Total Protein: 5.7 g/dL — ABNORMAL LOW (ref 6.5–8.1)

## 2022-12-27 LAB — BASIC METABOLIC PANEL
Anion gap: 8 (ref 5–15)
BUN: 30 mg/dL — ABNORMAL HIGH (ref 6–20)
CO2: 23 mmol/L (ref 22–32)
Calcium: 7.8 mg/dL — ABNORMAL LOW (ref 8.9–10.3)
Chloride: 99 mmol/L (ref 98–111)
Creatinine, Ser: 1.5 mg/dL — ABNORMAL HIGH (ref 0.61–1.24)
GFR, Estimated: 54 mL/min — ABNORMAL LOW (ref >60)
Glucose, Bld: 101 mg/dL — ABNORMAL HIGH (ref 70–99)
Potassium: 3.9 mmol/L (ref 3.5–5.1)
Sodium: 130 mmol/L — ABNORMAL LOW (ref 135–145)

## 2022-12-27 LAB — CBC
HCT: 28.9 % — ABNORMAL LOW (ref 39.0–52.0)
Hemoglobin: 9.5 g/dL — ABNORMAL LOW (ref 13.0–17.0)
MCH: 28.1 pg (ref 26.0–34.0)
MCHC: 32.9 g/dL (ref 30.0–36.0)
MCV: 85.5 fL (ref 80.0–100.0)
Platelets: 170 10*3/uL (ref 150–400)
RBC: 3.38 MIL/uL — ABNORMAL LOW (ref 4.22–5.81)
RDW: 17.1 % — ABNORMAL HIGH (ref 11.5–15.5)
WBC: 9.3 10*3/uL (ref 4.0–10.5)
nRBC: 0 % (ref 0.0–0.2)

## 2022-12-27 LAB — SEDIMENTATION RATE: Sed Rate: 61 mm/hr — ABNORMAL HIGH (ref 0–20)

## 2022-12-27 LAB — PROTIME-INR
INR: 1.4 — ABNORMAL HIGH (ref 0.8–1.2)
Prothrombin Time: 17 seconds — ABNORMAL HIGH (ref 11.4–15.2)

## 2022-12-27 MED ORDER — BLISTEX MEDICATED EX OINT
1.0000 | TOPICAL_OINTMENT | CUTANEOUS | Status: DC | PRN
  Administered 2022-12-27: 1 via TOPICAL
  Filled 2022-12-27: qty 6.3

## 2022-12-27 MED ORDER — FUROSEMIDE 40 MG PO TABS
40.0000 mg | ORAL_TABLET | Freq: Every day | ORAL | Status: DC
  Administered 2022-12-27 – 2022-12-29 (×3): 40 mg via ORAL
  Filled 2022-12-27 (×4): qty 1

## 2022-12-27 MED ORDER — SODIUM CHLORIDE 0.9 % IV SOLN
2.0000 g | Freq: Three times a day (TID) | INTRAVENOUS | Status: DC
  Administered 2022-12-27 – 2022-12-28 (×3): 2 g via INTRAVENOUS
  Filled 2022-12-27 (×4): qty 12.5

## 2022-12-27 MED ORDER — VANCOMYCIN HCL 1250 MG/250ML IV SOLN
1250.0000 mg | Freq: Two times a day (BID) | INTRAVENOUS | Status: DC
  Filled 2022-12-27: qty 250

## 2022-12-27 MED ORDER — VANCOMYCIN HCL IN DEXTROSE 1-5 GM/200ML-% IV SOLN
1000.0000 mg | Freq: Two times a day (BID) | INTRAVENOUS | Status: AC
  Administered 2022-12-27 – 2022-12-28 (×4): 1000 mg via INTRAVENOUS
  Filled 2022-12-27 (×4): qty 200

## 2022-12-27 MED ORDER — SPIRONOLACTONE 25 MG PO TABS
25.0000 mg | ORAL_TABLET | Freq: Every day | ORAL | Status: DC
  Administered 2022-12-27 – 2022-12-29 (×3): 25 mg via ORAL
  Filled 2022-12-27 (×4): qty 1

## 2022-12-27 NOTE — Progress Notes (Signed)
Pharmacy Antibiotic Note  Tom [name] is a 57 y.o. male admitted on 12/26/2022 with osteomyelitis.  Pharmacy has been consulted for Cefepime & Vancomycin dosing.  Vancomycin 1000 mg IV x 1 given 7/8 @ 2101 Vancomycin 1500 mg IV x 1 given 7/8 @ 2322  Scr 1.82>1.5  Plan: Change Cefepime to 2 grams IV every 8 hours Change vancomycin to 1000 mg IV every 12 hours Estimated AUC 448.9, Cmin 14.1 IBW, Scr 1.5, Vd coefficent 0.72 Vancomycin levels at steady state or as clinically indicated Follow renal function for further adjustments  Pharmacy will continue to follow and will adjust abx dosing whenever warranted.  Temp (24hrs), Avg:98.3 F (36.8 C), Min:98.1 F (36.7 C), Max:98.7 F (37.1 C)   Recent Labs  Lab 12/26/22 1705 12/26/22 1950 12/27/22 0654  WBC 9.0  --  9.3  CREATININE 1.82*  --  1.50*  LATICACIDVEN 2.4* 2.2*  --      Estimated Creatinine Clearance: 70.6 mL/min (A) (by C-G formula based on SCr of 1.5 mg/dL (H)).    Allergies  Allergen Reactions   Alfuzosin Other (See Comments)    Dizziness    Amlodipine Nausea And Vomiting   Bee Venom Swelling   Cymbalta [Duloxetine Hcl] Other (See Comments)    Emesis    Duloxetine Other (See Comments)    Emesis   Lisinopril    Flomax [Tamsulosin Hcl] Itching    Antimicrobials this admission: 7/08 Zosyn >> x 1 dose 7/09 Cefepime >>  7/08 Vancomycin >>   Microbiology results: 07/08 BCx: ngtd  Thank you for allowing pharmacy to be a part of this patient's care.  Barrie Folk, PharmD 12/27/2022 9:51 AM

## 2022-12-27 NOTE — Progress Notes (Signed)
PROGRESS NOTE    CHRISTEPHER [name]  ZOX:[id number] DOB: 1966-03-12 DOA: 12/26/2022 PCP: [name], DO  Outpatient Specialists: [facility]    Brief Narrative:   [name] is a 57 year old male with history of hypertension, hyperlipidemia, end-stage liver disease, alcoholic cirrhosis of the liver, COPD, GERD, history of right foot pain, history of complication associated with orthopedic device, hyponatremia, who presents to the emergency department for chief concerns of right calf swelling and pain.    At bedside, patient was able to tell me his name, age, location, current calendar year.   He reports his right lower leg has been swollen for a long time and he just noticed that it turned red and became more painful today.  He denies trauma to his leg.   He denies fever, chills, chest pain, shortness of breath, dysuria, hematuria, changes to his bowel movements.  He endorses compliance with his lactulose therefore has a lot of loose stools daily.   Assessment & Plan:   Principal Problem:   Cellulitis of right foot Active Problems:   Essential hypertension   Alcoholic cirrhosis of liver with ascites (HCC)   AKI (acute kidney injury) (HCC)   Complication associated with orthopedic device (HCC)   Ascites   Dyslipidemia   COPD with asthma   End stage liver disease (HCC)   Hyponatremia   Obesity (BMI 30-39.9)  # Right lower extremity swelling # History multiple orthopedic surgeries left lower extremity Patient says he was referred to the hospital by his [facility] concerned for infection. Though patient says swelling and erythema of RLE are not acute. No fever or leukocytosis but does have marked swelling and erythema of the RLE. Had PVL that was negative for PE end of May. - spoke w/ dr. Joice Lofts of ortho who given location defers to orthopedics - have consulted podiatry [facility] Bores) who will see in consult - holding on imaging as patient says  can't tolerate mri and unsure whether CT would be much help given presence of hardware - continue vanc/cefepime for now, follow blood cultures  # Cirrhosis # Ascites Patient says abdominal distention is at baseline and declines paracentesis. Has had large volume paracentesis at recent admission. Has been referred to IR for TIPS consideration - cont home lactulose - continue home lasix 40 and spiro 25  # Hyponatremia Chronic, stable, 2/2 cirrhosis - trend    DVT prophylaxis: lovenox Code Status: full Family Communication: none @ bedside  Level of care: Telemetry Medical Status is: Observation    Consultants:  podiatry  Procedures: None so far  Antimicrobials:   Vanc/cefepime   Subjective: Ongoing RLE pain  Objective: Vitals:   12/27/22 0200 12/27/22 0348 12/27/22 0400 12/27/22 0800  BP: 109/75  119/75 118/81  Pulse: 93  92 91  Resp: 17  14 11   Temp:  98.2 F (36.8 C)    TempSrc:  Oral    SpO2: 93%  96% 93%  Weight:      Height:        Intake/Output Summary (Last 24 hours) at 12/27/2022 1017 Last data filed at 12/26/2022 2325 Gross per 24 hour  Intake --  Output 100 ml  Net -100 ml   Filed Weights   12/26/22 1952  Weight: 116.1 kg    Examination:  General exam: Appears calm and comfortable  Respiratory system: Clear to auscultation. Respiratory effort normal. Cardiovascular system: S1 & S2 heard, RRR. No JVD, murmurs, rubs, gallops or clicks.   Gastrointestinal system: Abdomen  is distended, non-tender Central nervous system: Alert and oriented. No focal neurological deficits. Extremities:  Psychiatry: Judgement and insight appear normal. Mood & affect appropriate.     Data Reviewed: I have personally reviewed following labs and imaging studies  CBC: Recent Labs  Lab 12/26/22 1705 12/27/22 0654  WBC 9.0 9.3  NEUTROABS 4.7  --   HGB 10.2* 9.5*  HCT 31.3* 28.9*  MCV 85.3 85.5  PLT 195 170   Basic Metabolic Panel: Recent Labs  Lab  12/26/22 1705 12/27/22 0654  NA 132* 130*  K 4.1 3.9  CL 100 99  CO2 25 23  GLUCOSE 167* 101*  BUN 32* 30*  CREATININE 1.82* 1.50*  CALCIUM 8.4* 7.8*   GFR: Estimated Creatinine Clearance: 70.6 mL/min (A) (by C-G formula based on SCr of 1.5 mg/dL (H)). Liver Function Tests: Recent Labs  Lab 12/27/22 0654  AST 41  ALT 18  ALKPHOS 66  BILITOT 2.5*  PROT 5.7*  ALBUMIN 2.0*   No results for input(s): "LIPASE", "AMYLASE" in the last 168 hours. No results for input(s): "AMMONIA" in the last 168 hours. Coagulation Profile: Recent Labs  Lab 12/27/22 0654  INR 1.4*   Cardiac Enzymes: No results for input(s): "CKTOTAL", "CKMB", "CKMBINDEX", "TROPONINI" in the last 168 hours. BNP (last 3 results) No results for input(s): "PROBNP" in the last 8760 hours. HbA1C: No results for input(s): "HGBA1C" in the last 72 hours. CBG: No results for input(s): "GLUCAP" in the last 168 hours. Lipid Profile: No results for input(s): "CHOL", "HDL", "LDLCALC", "TRIG", "CHOLHDL", "LDLDIRECT" in the last 72 hours. Thyroid Function Tests: No results for input(s): "TSH", "T4TOTAL", "FREET4", "T3FREE", "THYROIDAB" in the last 72 hours. Anemia Panel: No results for input(s): "VITAMINB12", "FOLATE", "FERRITIN", "TIBC", "IRON", "RETICCTPCT" in the last 72 hours. Urine analysis:    Component Value Date/Time   COLORURINE YELLOW (A) 11/15/2022 1810   APPEARANCEUR HAZY (A) 11/15/2022 1810   LABSPEC 1.020 11/15/2022 1810   PHURINE 6.0 11/15/2022 1810   GLUCOSEU NEGATIVE 11/15/2022 1810   HGBUR LARGE (A) 11/15/2022 1810   BILIRUBINUR NEGATIVE 11/15/2022 1810   BILIRUBINUR Negative 08/24/2021 1516   KETONESUR NEGATIVE 11/15/2022 1810   PROTEINUR 30 (A) 11/15/2022 1810   UROBILINOGEN 0.2 08/24/2021 1516   NITRITE NEGATIVE 11/15/2022 1810   LEUKOCYTESUR NEGATIVE 11/15/2022 1810   Sepsis Labs: @LABRCNTIP (procalcitonin:4,lacticidven:4)  ) Recent Results (from the past 240 hour(s))  Blood culture  (routine x 2)     Status: None (Preliminary result)   Collection Time: 12/26/22  5:05 PM   Specimen: BLOOD  Result Value Ref Range Status   Specimen Description BLOOD RIGHT ANTECUBITAL  Final   Special Requests   Final    BOTTLES DRAWN AEROBIC AND ANAEROBIC Blood Culture adequate volume   Culture   Final    NO GROWTH < 24 HOURS Performed at [facility], [address] [place], [place] [address]    Report Status PENDING  Incomplete  Blood culture (routine x 2)     Status: None (Preliminary result)   Collection Time: 12/26/22  7:50 PM   Specimen: BLOOD  Result Value Ref Range Status   Specimen Description BLOOD LEFT ANTECUBITAL  Final   Special Requests   Final    BOTTLES DRAWN AEROBIC AND ANAEROBIC Blood Culture adequate volume   Culture   Final    NO GROWTH < 12 HOURS Performed at [facility], [address], [place], [place] [address]    Report Status PENDING  Incomplete  Radiology Studies: DG Foot Complete Right  Result Date: 12/26/2022 CLINICAL DATA:  Fall.  Right knee and foot pain. EXAM: RIGHT FOOT COMPLETE - 3+ VIEW COMPARISON:  CT 11/15/2022 FINDINGS: Again seen periosteal reaction involving the distal tibia extending to the ankle joint, no significant change from prior CT. Portions of screws in the distal tibia. Two screws traverse the talocalcaneal joint. Lucency adjacent to 1 of the talocalcaneal screws measures up to 4 cm. This is also unchanged from prior exam. Moderate talonavicular spurring. Mild degenerative change of the first metatarsal phalangeal joint. There is no evidence of acute fracture. Marked generalized soft tissue edema. Scattered punctate densities in the soft tissues, chronic. IMPRESSION: 1. No acute fracture of the right foot. 2. Marked generalized soft tissue edema. 3. 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 Postsurgical change in the distal tibia and  talocalcaneal fusion. Lucency adjacent to the talocalcaneal screws is also unchanged. Differential considerations are loosening and infection. Electronically Signed   By: Narda Rutherford M.D.   On: 12/26/2022 18:16   DG Knee Complete 4 Views Right  Result Date: 12/26/2022 CLINICAL DATA:  Fall, right knee and foot pain. EXAM: RIGHT KNEE - COMPLETE 4+ VIEW COMPARISON:  None Available. FINDINGS: Right knee arthroplasty. No acute or periprosthetic fracture. There is no periprosthetic lucency. No joint effusion. Prior patellar resurfacing. No significant knee joint effusion. Generalized soft tissue edema. No soft tissue gas. IMPRESSION: 1. Right knee arthroplasty without complication. No acute fracture. 2. Soft tissue edema. Electronically Signed   By: Narda Rutherford M.D.   On: 12/26/2022 18:13        Scheduled Meds:  enoxaparin (LOVENOX) injection  60 mg Subcutaneous Q24H   famotidine  20 mg Oral QPC supper   folic acid  1 mg Oral Daily   lactulose  10 g Oral Daily   mometasone-formoterol  2 puff Inhalation BID   multivitamin with minerals  1 tablet Oral Daily   pantoprazole  40 mg Oral QAC breakfast   Continuous Infusions:  ceFEPime (MAXIPIME) IV     vancomycin       LOS: 0 days     Silvano Bilis, MD [facility]   If 7PM-7AM, please contact night-coverage [email or web address] Password [email or web address] 12/27/2022, 10:17 AM

## 2022-12-27 NOTE — Consult Note (Signed)
PODIATRY CONSULTATION  NAME Tom [name] MRN [id number] DOB 26-Jan-1966 DOA 12/26/2022   Reason for consult: Cellulitis/chronic osteomyelitis RT ankle/leg Chief Complaint  Patient presents with   Leg Pain    Consulting physician: Shonna Chock MD, [facility]  History of present illness: 57 y.o. male PMHx HTN, end-stage liver disease, COPD, and past history of multiple right lower extremity surgeries involving the ankle.  Right ankle managed by Dr. Daphane Shepherd, [facility].  Patient states that he reached out to Dr. Ronnald Nian office for concerns of increased redness with pain associated to the right lower extremity.  Recommended that he presents to the emergency department for antibiotics and evaluation.    X-rays taken negative for abscess or gas within the tissue.  Findings consistent with chronic osteomyelitis of the right foot and ankle.  Podiatry consulted  Past Medical History:  Diagnosis Date   Arthritis    Asthma    COPD (chronic obstructive pulmonary disease) (HCC)    Hyperlipidemia    Hypertension    Hypokalemia 12/04/2022   Hypomagnesemia 12/04/2022   Hypomagnesemia 12/04/2022   Osteoporosis        Latest Ref Rng & Units 12/27/2022    6:54 AM 12/26/2022    5:05 PM 12/18/2022    9:06 AM  CBC  WBC 4.0 - 10.5 K/uL 9.3  9.0  8.0   Hemoglobin 13.0 - 17.0 g/dL 9.5  40.9  9.7   Hematocrit 39.0 - 52.0 % 28.9  31.3  28.8   Platelets 150 - 400 K/uL 170  195  174        Latest Ref Rng & Units 12/27/2022    6:54 AM 12/26/2022    5:05 PM 12/19/2022    5:19 AM  BMP  Glucose 70 - 99 mg/dL 811  914  782   BUN 6 - 20 mg/dL 30  32  16   Creatinine 0.61 - 1.24 mg/dL 9.56  2.13  0.86   Sodium 135 - 145 mmol/L 130  132  130   Potassium 3.5 - 5.1 mmol/L 3.9  4.1  4.1   Chloride 98 - 111 mmol/L 99  100  101   CO2 22 - 32 mmol/L 23  25  23    Calcium 8.9 - 10.3 mg/dL 7.8  8.4  7.7     RT Foot/Leg 12/26/2022   Physical Exam: General: The patient is alert and  oriented x3 in no acute distress.   Dermatology: Superficial wound noted to the plantar heel of the right foot.  Patient states that this is where an orthopedic screw protruded from the plantar heel and was subsequently removed about 3 months ago.  Granular wound base.  No purulence.  Overall it appears very stable  Vascular: Chronic right lower extremity edema presenting today with erythema.  Neurological: Diminished via light touch  Musculoskeletal Exam: History of multiple right foot and ankle surgeries.  Patient currently ambulatory in a cam boot.  DG Foot Complete Right 12/26/2022 IMPRESSION: 1. No acute fracture of the right foot. 2. Marked generalized soft tissue edema. 3. 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 Postsurgical change in the distal tibia and talocalcaneal fusion. Lucency adjacent to the talocalcaneal screws is also unchanged. Differential considerations are loosening and infection.    ASSESSMENT/PLAN OF CARE 1.  Right leg cellulitis 2.  Chronic osteomyelitis right foot and ankle  -Patient seen at bedside in the emergency department today -Findings on  radiographic exam demonstrate chronic erosion and osteomyelitis.  No evidence of acute findings. -I do not see any benefit for additional imaging -Recommend IV ABX and monitor the cellulitis of the next 24-48 hours -No plans for any surgical intervention during this admission.  Follow-up with Dr. Daphane Shepherd post discharge. -Patient has also been managed by infectious disease at [facility]. Recommend f/u.  According the patient he is on long-term oral antibiotics for the chronic osteomyelitis -Podiatry will sign off   Thank you for the consult.  Please contact me directly via secure chat with any questions or concerns.     Felecia Shelling, DPM [facility]  Dr. Felecia Shelling, DPM    [address] [place], [place] [address]                Office [phone]  Fax [phone]

## 2022-12-28 DIAGNOSIS — R188 Other ascites: Secondary | ICD-10-CM | POA: Diagnosis not present

## 2022-12-28 DIAGNOSIS — K7460 Unspecified cirrhosis of liver: Secondary | ICD-10-CM | POA: Diagnosis not present

## 2022-12-28 DIAGNOSIS — L03115 Cellulitis of right lower limb: Secondary | ICD-10-CM | POA: Diagnosis not present

## 2022-12-28 LAB — BASIC METABOLIC PANEL
Anion gap: 9 (ref 5–15)
BUN: 24 mg/dL — ABNORMAL HIGH (ref 6–20)
CO2: 22 mmol/L (ref 22–32)
Calcium: 7.9 mg/dL — ABNORMAL LOW (ref 8.9–10.3)
Chloride: 102 mmol/L (ref 98–111)
Creatinine, Ser: 1.56 mg/dL — ABNORMAL HIGH (ref 0.61–1.24)
GFR, Estimated: 51 mL/min — ABNORMAL LOW (ref >60)
Glucose, Bld: 112 mg/dL — ABNORMAL HIGH (ref 70–99)
Potassium: 3.6 mmol/L (ref 3.5–5.1)
Sodium: 133 mmol/L — ABNORMAL LOW (ref 135–145)

## 2022-12-28 LAB — GLUCOSE, CAPILLARY: Glucose-Capillary: 141 mg/dL — ABNORMAL HIGH (ref 70–99)

## 2022-12-28 LAB — C-REACTIVE PROTEIN: CRP: 2.1 mg/dL — ABNORMAL HIGH (ref <1.0)

## 2022-12-28 MED ORDER — MELATONIN 5 MG PO TABS
2.5000 mg | ORAL_TABLET | Freq: Every evening | ORAL | Status: DC | PRN
  Administered 2022-12-28 – 2022-12-29 (×2): 2.5 mg via ORAL
  Filled 2022-12-28 (×2): qty 1

## 2022-12-28 MED ORDER — CEFAZOLIN SODIUM-DEXTROSE 2-4 GM/100ML-% IV SOLN
2.0000 g | Freq: Three times a day (TID) | INTRAVENOUS | Status: AC
  Administered 2022-12-29 – 2022-12-30 (×3): 2 g via INTRAVENOUS
  Filled 2022-12-28 (×3): qty 100

## 2022-12-28 MED ORDER — SODIUM CHLORIDE 0.9 % IV SOLN
2.0000 g | INTRAVENOUS | Status: DC
  Administered 2022-12-28: 2 g via INTRAVENOUS
  Filled 2022-12-28: qty 20

## 2022-12-28 NOTE — Consult Note (Signed)
NAME: Tom [name]  DOB: 03/28/1966  MRN: [id number]  Date/Time: 12/28/2022 12:16 PM  REQUESTING PROVIDER: Dr.PAtel Subjective:  REASON FOR CONSULT: cellulitis r tfoot ? Tom [name] is a 57 y.o. 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 Patient has had a chronic issue with the right foot after a complicated surgery 2019 He is followed at [facility].  He  was taking clindamycin but could not tolerate it He has an appointment with infectious disease on 01/05/23  Vital signs the ED BP of 143/89, temperature 98.1, heart rate 94, respirate 13 and sats are 97%. WBC 9, Hb 10.2, platelet 195 creatinine 1.82. Past Medical History:  Diagnosis Date   Arthritis    Asthma    COPD (chronic obstructive pulmonary disease) (HCC)    Hyperlipidemia    Hypertension    Hypokalemia 12/04/2022   Hypomagnesemia 12/04/2022   Hypomagnesemia 12/04/2022   Osteoporosis     Past Surgical History:  Procedure Laterality Date   ANKLE FUSION Right 2017   BACK SURGERY     TOTAL KNEE ARTHROPLASTY Right 04/2012    Social History   Socioeconomic History   Marital status: Single    Spouse name: Not on file   Number of children: Not on file   Years of education: Not on file   Highest education  level: GED or equivalent  Occupational History   Not on file  Tobacco Use   Smoking status: Former    Packs/day: 1.00    Years: 39.00    Additional pack years: 0.00    Total pack years: 39.00    Types: Cigarettes    Quit date: 12/2015    Years since quitting: 7.0   Smokeless tobacco: Never  Vaping Use   Vaping Use: Never used  Substance and Sexual Activity   Alcohol use: No   Drug use: Yes    Types: Marijuana   Sexual activity: Yes  Other Topics Concern   Not on file  Social History Narrative   Not on file   Social Determinants of Health   Financial Resource Strain: Low Risk  (10/09/2022)   Overall Financial Resource Strain (CARDIA)    Difficulty of Paying Living Expenses: Not hard at all  Food Insecurity: No Food Insecurity (12/27/2022)   Hunger Vital Sign    Worried About Running Out of Food in the Last Year: Never true    Ran Out of Food in the Last Year: Never true  Transportation Needs: Unmet Transportation Needs (12/27/2022)   PRAPARE - Administrator, Civil Service (Medical): Yes    Lack of Transportation (Non-Medical): No  Physical Activity: Unknown (10/09/2022)   Exercise Vital Sign    Days of Exercise per Week: Patient declined    Minutes of Exercise per Session: Not on file  Stress: Stress Concern Present (10/09/2022)  [employer] of Occupational Health - Occupational Stress Questionnaire    Feeling of Stress : To some extent  Social Connections: Unknown (10/09/2022)   Social Connection and Isolation Panel [NHANES]    Frequency of Communication with Friends and Family: More than three times a week    Frequency of Social Gatherings with Friends and Family: Once a week    Attends Religious Services: Patient declined    Database administrator or Organizations: No    Attends Engineer, structural: Not on file    Marital Status: Living with partner  Intimate Partner Violence: Not At Risk (12/27/2022)   Humiliation, Afraid, Rape, and Kick  questionnaire    Fear of Current or Ex-Partner: No    Emotionally Abused: No    Physically Abused: No    Sexually Abused: No    History reviewed. No pertinent family history. Allergies  Allergen Reactions   Alfuzosin Other (See Comments)    Dizziness    Amlodipine Nausea And Vomiting   Bee Venom Swelling   Cymbalta [Duloxetine Hcl] Other (See Comments)    Emesis    Duloxetine Other (See Comments)    Emesis   Lisinopril    Flomax [Tamsulosin Hcl] Itching   I? Current Facility-Administered Medications  Medication Dose Route Frequency Provider Last Rate Last Admin   acetaminophen (TYLENOL) tablet 650 mg  650 mg Oral Q6H PRN Cox, Amy N, DO       Or   acetaminophen (TYLENOL) suppository 650 mg  650 mg Rectal Q6H PRN Cox, Amy N, DO       albuterol (PROVENTIL) (2.5 MG/3ML) 0.083% nebulizer solution 2.5 mg  2.5 mg Nebulization Q4H PRN Cox, Amy N, DO       cefTRIAXone (ROCEPHIN) 2 g in sodium chloride 0.9 % 100 mL IVPB  2 g Intravenous Q24H Enedina Finner, MD       enoxaparin (LOVENOX) injection 60 mg  60 mg Subcutaneous Q24H Cox, Amy N, DO   60 mg at 12/28/22 4098   famotidine (PEPCID) tablet 20 mg  20 mg Oral QPC supper Cox, Amy N, DO   20 mg at 12/27/22 1829   folic acid (FOLVITE) tablet 1 mg  1 mg Oral Daily Cox, Amy N, DO   1 mg at 12/28/22 1191   furosemide (LASIX) tablet 40 mg  40 mg Oral Daily Kathrynn Running, MD   40 mg at 12/28/22 4782   hydrALAZINE (APRESOLINE) injection 5 mg  5 mg Intravenous Q8H PRN Cox, Amy N, DO       hydrOXYzine (ATARAX) tablet 25 mg  25 mg Oral Q8H PRN Cox, Amy N, DO       lactulose (CHRONULAC) 10 GM/15ML solution 10 g  10 g Oral Daily Cox, Amy N, DO   10 g at 12/28/22 1031   lip balm (BLISTEX) ointment 1 Application  1 Application Topical PRN Cox, Amy N, DO   1 Application at 12/27/22 2353   mometasone-formoterol (DULERA) 100-5 MCG/ACT inhaler 2 puff  2 puff Inhalation BID Cox, Amy N, DO   2 puff at 12/28/22 0807   multivitamin with minerals tablet 1  tablet  1 tablet Oral Daily Cox, Amy N, DO   1 tablet at 12/28/22 9562   oxyCODONE (Oxy IR/ROXICODONE) immediate release tablet 10 mg  10 mg Oral QID PRN Cox, Amy N, DO   10 mg at 12/28/22 1031   pantoprazole (PROTONIX) EC tablet 40 mg  40 mg Oral QAC breakfast Cox,  Amy N, DO   40 mg at 12/28/22 1610   senna-docusate (Senokot-S) tablet 1 tablet  1 tablet Oral QHS PRN Cox, Amy N, DO       spironolactone (ALDACTONE) tablet 25 mg  25 mg Oral Daily Kathrynn Running, MD   25 mg at 12/28/22 9604   vancomycin (VANCOCIN) IVPB 1000 mg/200 mL premix  1,000 mg Intravenous Q12H Barrie Folk, RPH 200 mL/hr at 12/28/22 1035 1,000 mg at 12/28/22 1035     Abtx:  Anti-infectives (From admission, onward)    Start     Dose/Rate Route Frequency Ordered Stop   12/28/22 1400  cefTRIAXone (ROCEPHIN) 2 g in sodium chloride 0.9 % 100 mL IVPB        2 g 200 mL/hr over 30 Minutes Intravenous Every 24 hours 12/28/22 0954     12/27/22 2100  vancomycin (VANCOREADY) IVPB 1750 mg/350 mL  Status:  Discontinued        1,750 mg 175 mL/hr over 120 Minutes Intravenous Every 24 hours 12/26/22 2315 12/27/22 0956   12/27/22 1454  ceFEPIme (MAXIPIME) 2 g in sodium chloride 0.9 % 100 mL IVPB  Status:  Discontinued        2 g 200 mL/hr over 30 Minutes Intravenous Every 8 hours 12/27/22 0955 12/28/22 0954   12/27/22 1100  vancomycin (VANCOREADY) IVPB 1250 mg/250 mL  Status:  Discontinued        1,250 mg 166.7 mL/hr over 90 Minutes Intravenous Every 12 hours 12/27/22 0956 12/27/22 0959   12/27/22 1100  vancomycin (VANCOCIN) IVPB 1000 mg/200 mL premix        1,000 mg 200 mL/hr over 60 Minutes Intravenous Every 12 hours 12/27/22 0959     12/27/22 1000  cefTRIAXone (ROCEPHIN) 2 g in sodium chloride 0.9 % 100 mL IVPB  Status:  Discontinued        2 g 200 mL/hr over 30 Minutes Intravenous Every 24 hours 12/26/22 2225 12/26/22 2258   12/27/22 0600  ceFEPIme (MAXIPIME) 2 g in sodium chloride 0.9 % 100 mL IVPB  Status:   Discontinued        2 g 200 mL/hr over 30 Minutes Intravenous Every 12 hours 12/26/22 2302 12/27/22 0955   12/26/22 2315  vancomycin (VANCOREADY) IVPB 1500 mg/300 mL        1,500 mg 150 mL/hr over 120 Minutes Intravenous  Once 12/26/22 2302 12/27/22 0216   12/26/22 2230  cefTRIAXone (ROCEPHIN) 2 g in sodium chloride 0.9 % 100 mL IVPB  Status:  Discontinued        2 g 200 mL/hr over 30 Minutes Intravenous Every 24 hours 12/26/22 2221 12/26/22 2225   12/26/22 2045  vancomycin (VANCOCIN) IVPB 1000 mg/200 mL premix        1,000 mg 200 mL/hr over 60 Minutes Intravenous STAT 12/26/22 2033 12/26/22 2222   12/26/22 2030  vancomycin (VANCOCIN) injection 1 g  Status:  Discontinued        1 g Intravenous  Once 12/26/22 2021 12/26/22 2033   12/26/22 2030  piperacillin-tazobactam (ZOSYN) IVPB 3.375 g        3.375 g 100 mL/hr over 30 Minutes Intravenous  Once 12/26/22 2021 12/26/22 2121       REVIEW OF SYSTEMS:  Const: negative fever, negative chills, negative weight loss Eyes: negative diplopia or visual changes, negative eye pain ENT: negative coryza, negative sore throat Resp: negative cough, hemoptysis, dyspnea Cards: negative for chest pain, palpitations, lower extremity edema GU: negative for  frequency, dysuria and hematuria GI: Negative for abdominal pain, diarrhea, bleeding, constipation Skin: negative for rash and pruritus Heme: negative for easy bruising and gum/nose bleeding MS: rt knee pain, rt ankle pain Neurolo:negative for headaches, dizziness, vertigo, memory problems  Psych: anxiety, depression  Endocrine: negative for thyroid, diabetes Allergy/Immunology- as above, but no antibiotics Objective:  VITALS:  BP 122/75 (BP Location: Left Arm)   Pulse 85   Temp 98.2 F (36.8 C) (Oral)   Resp 18   Ht 6' (1.829 m)   Wt 116.1 kg   SpO2 97%   BMI 34.72 kg/m   PHYSICAL EXAM:  General: Alert, cooperative, no distress, appears stated age.  Head: Normocephalic, without  obvious abnormality, atraumatic. Eyes: Conjunctivae clear, anicteric sclerae. Pupils are equal ENT Nares normal. No drainage or sinus tenderness. Lips, mucosa, and tongue normal. No Thrush Neck: Supple, symmetrical, no adenopathy, thyroid: non tender no carotid bruit and no JVD. Back: No CVA tenderness. Lungs: Clear to auscultation bilaterally. No Wheezing or Rhonchi. No rales. Heart: Regular rate and rhythm, no murmur, rub or gallop. Abdomen: Soft, distended. Bowel sounds normal. No masses Extremities: rt leg swollen- erythema Scar over ankle- small wound at the bottom of the foot- no drainage      Skin: No rashes or lesions. Or bruising Lymph: Cervical, supraclavicular normal. Neurologic: Grossly non-focal Pertinent Labs Lab Results CBC    Component Value Date/Time   WBC 9.3 12/27/2022 0654   RBC 3.38 (L) 12/27/2022 0654   HGB 9.5 (L) 12/27/2022 0654   HCT 28.9 (L) 12/27/2022 0654   PLT 170 12/27/2022 0654   MCV 85.5 12/27/2022 0654   MCH 28.1 12/27/2022 0654   MCHC 32.9 12/27/2022 0654   RDW 17.1 (H) 12/27/2022 0654   LYMPHSABS 1.6 12/26/2022 1705   MONOABS 2.1 (H) 12/26/2022 1705   EOSABS 0.4 12/26/2022 1705   BASOSABS 0.2 (H) 12/26/2022 1705       Latest Ref Rng & Units 12/28/2022    7:46 AM 12/27/2022    6:54 AM 12/26/2022    5:05 PM  CMP  Glucose 70 - 99 mg/dL 161  096  045   BUN 6 - 20 mg/dL 24  30  32   Creatinine 0.61 - 1.24 mg/dL 4.09  8.11  9.14   Sodium 135 - 145 mmol/L 133  130  132   Potassium 3.5 - 5.1 mmol/L 3.6  3.9  4.1   Chloride 98 - 111 mmol/L 102  99  100   CO2 22 - 32 mmol/L 22  23  25    Calcium 8.9 - 10.3 mg/dL 7.9  7.8  8.4   Total Protein 6.5 - 8.1 g/dL  5.7    Total Bilirubin 0.3 - 1.2 mg/dL  2.5    Alkaline Phos 38 - 126 U/L  66    AST 15 - 41 U/L  41    ALT 0 - 44 U/L  18        Microbiology: Recent Results (from the past 240 hour(s))  Blood culture (routine x 2)     Status: None (Preliminary result)   Collection Time: 12/26/22   5:05 PM   Specimen: BLOOD  Result Value Ref Range Status   Specimen Description BLOOD RIGHT ANTECUBITAL  Final   Special Requests   Final    BOTTLES DRAWN AEROBIC AND ANAEROBIC Blood Culture adequate volume   Culture   Final    NO GROWTH < 24 HOURS Performed at [facility], [address] [place], [place] [address]    Report Status PENDING  Incomplete  Blood culture (routine x 2)     Status: None (Preliminary result)   Collection Time: 12/26/22  7:50 PM   Specimen: BLOOD  Result Value Ref Range Status   Specimen Description BLOOD LEFT ANTECUBITAL  Final   Special Requests   Final    BOTTLES DRAWN AEROBIC AND ANAEROBIC Blood Culture adequate volume   Culture   Final    NO GROWTH < 12 HOURS Performed at [facility], [address], [place], [place] [address]    Report Status PENDING  Incomplete    IMAGING RESULTS:  X-ray foot X-ray foot no acute fracture of the right foot Chronic periosteal reaction involving the distal tibia extending to the ankle joint unchanged from prior CT.  Lucency adjacent to the talar calcaneal screws is also unchanged.  Differential considerations are loosening and infection. I have personally reviewed the films ? Impression/Recommendation Cellulitis rt leg- no systemic involvement on iv vanco and cefepime- change to cefazolin  Once better can switch to po cefadroxil for 7 -10 days  Rt ankle fusion surgery in 2019 complicated by loosening of hardware Recent screw removal - tsaph intermedius in clutre No acute infection at the site Follow at [facility] foot surgery and ID? ? Cirrhosis liver with ascites S/p large volume paracentesis recently ?pt has not had any alcohol  Anemia  Aki on CKD- improving   H/o rt TKA   ___________________________________________________ Discussed with patient, requesting provider Note:  This document was prepared using Dragon voice recognition software and may include unintentional dictation  errors.

## 2022-12-28 NOTE — Progress Notes (Signed)
[facility]  - [place] [facility]   PATIENT NAME: Tom [name]    MR#:  [id number]  DATE OF BIRTH:  09-27-1965  SUBJECTIVE:  patient seen earlier. No family at bedside. Came in with pain in his right foot/ankle. Patient tells me his had chronic swelling of his right leg for several months. He has had several surgeries done on the right ankle at [facility] for joint fusion. Asking for his pain medicine. He gets his pain meds through primary care physician Dr. Andrena [name]. Patient says he is dealing with chronic pain   VITALS:  Blood pressure 120/75, pulse 95, temperature 98 F (36.7 C), resp. rate 18, height 6' (1.829 m), weight 116.1 kg, SpO2 92 %.  PHYSICAL EXAMINATION:   GENERAL:  57 y.o.-year-old patient with no acute distress.  LUNGS: Normal breath sounds bilaterally, no wheezing CARDIOVASCULAR: S1, S2 normal. No murmur   ABDOMEN: Soft, nontender, nondistended. Bowel sounds present.  EXTREMITIES:  NEUROLOGIC: nonfocal  patient is alert and awake SKIN: as above -- no skin drainage  LABORATORY PANEL:  CBC Recent Labs  Lab 12/27/22 0654  WBC 9.3  HGB 9.5*  HCT 28.9*  PLT 170    Chemistries  Recent Labs  Lab 12/27/22 0654 12/28/22 0746  NA 130* 133*  K 3.9 3.6  CL 99 102  CO2 23 22  GLUCOSE 101* 112*  BUN 30* 24*  CREATININE 1.50* 1.56*  CALCIUM 7.8* 7.9*  AST 41  --   ALT 18  --   ALKPHOS 66  --   BILITOT 2.5*  --    Cardiac Enzymes No results for input(s): "TROPONINI" in the last 168 hours. RADIOLOGY:  DG Foot Complete Right  Result Date: 12/26/2022 CLINICAL DATA:  Fall.  Right knee and foot pain. EXAM: RIGHT FOOT COMPLETE - 3+ VIEW COMPARISON:  CT 11/15/2022 FINDINGS: Again seen periosteal reaction involving the distal tibia extending to the ankle joint, no significant change from prior CT. Portions of screws in the distal tibia. Two screws traverse the talocalcaneal joint. Lucency adjacent to 1 of the talocalcaneal screws measures up  to 4 cm. This is also unchanged from prior exam. Moderate talonavicular spurring. Mild degenerative change of the first metatarsal phalangeal joint. There is no evidence of acute fracture. Marked generalized soft tissue edema. Scattered punctate densities in the soft tissues, chronic. IMPRESSION: 1. No acute fracture of the right foot. 2. Marked generalized soft tissue edema. 3. 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 Postsurgical change in the distal tibia and talocalcaneal fusion. Lucency adjacent to the talocalcaneal screws is also unchanged. Differential considerations are loosening and infection. Electronically Signed   By: Tom [name] M.D.   On: 12/26/2022 18:16   DG Knee Complete 4 Views Right  Result Date: 12/26/2022 CLINICAL DATA:  Fall, right knee and foot pain. EXAM: RIGHT KNEE - COMPLETE 4+ VIEW COMPARISON:  None Available. FINDINGS: Right knee arthroplasty. No acute or periprosthetic fracture. There is no periprosthetic lucency. No joint effusion. Prior patellar resurfacing. No significant knee joint effusion. Generalized soft tissue edema. No soft tissue gas. IMPRESSION: 1. Right knee arthroplasty without complication. No acute fracture. 2. Soft tissue edema. Electronically Signed   By: Tom [name] M.D.   On: 12/26/2022 18:13    Assessment and Plan Tom [name] is a 57 year old male with history of hypertension, hyperlipidemia, end-stage liver disease, alcoholic cirrhosis of the liver, COPD, GERD, history of right foot pain, history of  complication associated with orthopedic device, hyponatremia, who presents to the emergency department for chief concerns of right calf swelling and pain.  He reports his right lower leg has been swollen for a long time and he just noticed that it turned red and became more painful today. He denies trauma to his leg.   Right lower extremity swelling-chronic History multiple  orthopedic surgeries right ankle extremity --Patient says he was referred to the hospital by his [facility]ist concerned for infection. Though patient says swelling and erythema of RLE are not acute. No fever or leukocytosis but does have marked swelling and erythema of the RLE.  - spoke w/ Tom [name] of ortho who given location defers to podiatry - have consulted podiatry dr Tom [name] has sign off. --per Podiatry-- Findings on radiographic exam demonstrate chronic erosion and osteomyelitis.  No evidence of acute findings. -I do not see any benefit for additional imaging -Recommend IV ABX and monitor the cellulitis of the next 24-48 hours -No plans for any surgical intervention during this admission.  Follow-up with Dr. Daphane [name] post discharge. --pt request ID consultation here--does not want to go to [facility] --?chronic infection   Cirrhosis with Ascites Patient says abdominal distention is at baseline and declines paracentesis. Has had large volume paracentesis at recent admission. Has been referred to IR for TIPS consideration - cont home lactulose - continue home lasix 40 and spironolactone 25    Hyponatremia Chronic, stable, 2/2 cirrhosis - trend   Chronic right ankle pain --will cont po oxycodone as home dose -- recommended patient follow-up outpatient pain clinic  PT OT to see patient  Procedures: Family communication : none today Consults : podiatry, ID CODE STATUS: full DVT Prophylaxis : Lovenox Level of care: Telemetry Medical Status is: Inpatient Remains inpatient appropriate because: right ankle swelling and pain    TOTAL TIME TAKING CARE OF THIS PATIENT: 35 minutes.  >50% time spent on counselling and coordination of care  Note: This dictation was prepared with Dragon dictation along with smaller phrase technology. Any transcriptional errors that result from this process are unintentional.  Enedina Finner M.D    [facility]   CC: Primary care  physician; Tom Cords, DO

## 2022-12-28 NOTE — Discharge Instructions (Addendum)
Transportation Resources  Agency Name: [facility] Agency Address: [address] [place], [place] [address] Phone: [phone] Email: [email or web address] Website: [email or web address] Service(s) Offered: Housing services, self-sufficiency, congregate meal program, weatherization program, Field seismologist program, emergency food assistance,  housing counseling, home ownership program, wheels-towork program.  Agency Name: [facility] [employer] [phone]) Address: [address], [place], [place] [address] Phone: [phone] Website: [email or web address] Service(s) Offered: Transportation for [employer], subscription and demand response; Dial-a-Ride for citizens 22 years of age or older.  Agency Name: Department of Social Services Address: [address] [place], [place] [address] Phone: [phone] Service(s) Offered: Child support services; child welfare services; food stamps; Medicaid; work first family assistance; and aid with fuel,  rent, food and medicine, transportation assistance.  Agency Name: [employer]abled [employer] ([employer]) [employer]  Network Phone: [phone] Service(s) Offered: Transports veterans to the [facility] medical center. Call  forty-eight hours in advance and leave the name, telephone  number, date, and time of appointment. Veteran will be  contacted by the driver the day before the appointment to  arrange a pick up point  Patient advised to follow-up with [facility] orthopedic on his next appointment. He is also recommended keep appointment with [facility] infectious disease in July.  Outpatient referral for ambulatory PT at [facility] has been sent

## 2022-12-28 NOTE — TOC Initial Note (Addendum)
Transition of Care [facility]) - Initial/Assessment Note    Patient Details  Name: Tom [name] MRN: [id number] Date of Birth: 03-17-1966  Transition of Care [facility]) CM/SW Contact:    Margarito Liner, LCSW Phone Number: 12/28/2022, 10:31 AM  Clinical Narrative:   CSW went by room for readmission prevention screen. CSW met with patient. No supports at bedside. CSW introduced role and explained that discharge planning would be discussed. PCP is Saralyn Pilar, DO. Patient became tearful, voicing frustration about being told he was going to die. CSW notified MD. Patient declined chaplain consult. CSW offered to come back later. Per chart review, [facility] [place]. It does not appear that he is active with any home health services.     1:55 pm: SDOH flag for transportation. TOC put resources on AVS last admission. Will add again.       Expected Discharge Plan: Home/Self Care Barriers to Discharge: Continued Medical Work up   Patient Goals and CMS Choice            Expected Discharge Plan and Services     Post Acute Care Choice: NA Living arrangements for the past 2 months: Single Family Home                                      Prior Living Arrangements/Services Living arrangements for the past 2 months: Single Family Home   Patient language and need for interpreter reviewed:: Yes        Need for Family Participation in Patient Care: Yes (Comment)     Criminal Activity/Legal Involvement Pertinent to Current Situation/Hospitalization: No - Comment as needed  Activities of Daily Living Home Assistive Devices/Equipment: Walker (specify type) ADL Screening (condition at time of admission) Patient's cognitive ability adequate to safely complete daily activities?: Yes Is the patient deaf or have difficulty hearing?: No Does the patient have difficulty seeing, even when wearing glasses/contacts?: No Does the patient have difficulty concentrating,  remembering, or making decisions?: No Patient able to express need for assistance with ADLs?: Yes Does the patient have difficulty dressing or bathing?: No Independently performs ADLs?: Yes (appropriate for developmental age) Does the patient have difficulty walking or climbing stairs?: Yes Weakness of Legs: Both Weakness of Arms/Hands: None  Permission Sought/Granted                  Emotional Assessment Appearance:: Appears stated age   Affect (typically observed): Tearful/Crying, Frustrated Orientation: : Oriented to Self, Oriented to Place, Oriented to  Time, Oriented to Situation Alcohol / Substance Use: Not Applicable Psych Involvement: No (comment)  Admission diagnosis:  Cellulitis of right foot [L03.115] Cellulitis of right lower extremity [L03.115] Patient Active Problem List   Diagnosis Date Noted   Obesity (BMI 30-39.9) 12/27/2022   Cellulitis of right foot 12/26/2022   Hyponatremia 12/17/2022   End stage liver disease (HCC) 12/15/2022   Dyslipidemia 12/04/2022   COPD with asthma 12/04/2022   GERD without esophagitis 12/04/2022   Abdominal distension 12/04/2022   Shortness of breath 12/04/2022   Ascites 12/03/2022   Right foot pain 11/15/2022   AKI (acute kidney injury) (HCC) 11/15/2022   Elevated lactic acid level 11/15/2022   Complication associated with orthopedic device (HCC) 10/21/2022   Alcoholic cirrhosis of liver with ascites (HCC) 07/04/2022   Former smoker 09/16/2021   Asthmatic bronchitis , chronic 09/16/2021   Pre-diabetes 08/13/2021   Morbid  obesity due to excess calories (HCC) 08/13/2021   DOE (dyspnea on exertion) 04/13/2021   Acute pain of left wrist 03/25/2021   DDD (degenerative disc disease), lumbar 12/16/2020   Chronic back pain 12/16/2020   Arthritis of right ankle 05/08/2017   Atherosclerosis of native coronary artery of native heart without angina pectoris 01/06/2015   Essential hypertension 10/03/2012   Osteoarthritis 12/06/2011    PCP:  [name], DO Pharmacy:   [phone] [place], [place] - [address] [place] [address] Phone: [phone] Fax: [phone]     Social Determinants of Health (SDOH) Social History: SDOH Screenings   Food Insecurity: No Food Insecurity (12/27/2022)  Housing: Patient Declined (12/27/2022)  Transportation Needs: Unmet Transportation Needs (12/27/2022)  Utilities: Not At Risk (12/27/2022)  Alcohol Screen: Low Risk  (09/21/2022)  Depression (PHQ2-9): Low Risk  (03/21/2022)  Financial Resource Strain: Low Risk  (10/09/2022)  Physical Activity: Unknown (10/09/2022)  Social Connections: Unknown (10/09/2022)  Stress: Stress Concern Present (10/09/2022)  Tobacco Use: Medium Risk (12/27/2022)   SDOH Interventions:     Readmission Risk Interventions    12/28/2022   10:29 AM 12/16/2022    5:06 PM  Readmission Risk Prevention Plan  PCP or Specialist Appt within 5-7 Days  Complete  Medication Review (RN CM)  Complete  PCP or Specialist appointment within 3-5 days of discharge Complete   Palliative Care Screening Not Applicable   Skilled Nursing Facility Not Applicable

## 2022-12-29 ENCOUNTER — Inpatient Hospital Stay: Payer: Medicaid Other

## 2022-12-29 DIAGNOSIS — R188 Other ascites: Secondary | ICD-10-CM | POA: Diagnosis not present

## 2022-12-29 DIAGNOSIS — L03115 Cellulitis of right lower limb: Secondary | ICD-10-CM | POA: Diagnosis not present

## 2022-12-29 DIAGNOSIS — K7460 Unspecified cirrhosis of liver: Secondary | ICD-10-CM | POA: Diagnosis not present

## 2022-12-29 LAB — CREATININE, SERUM
Creatinine, Ser: 1.37 mg/dL — ABNORMAL HIGH (ref 0.61–1.24)
GFR, Estimated: 60 mL/min — ABNORMAL LOW (ref >60)

## 2022-12-29 MED ORDER — CEFADROXIL 500 MG PO CAPS
1000.0000 mg | ORAL_CAPSULE | Freq: Two times a day (BID) | ORAL | Status: DC
  Administered 2022-12-30: 1000 mg via ORAL
  Filled 2022-12-29: qty 2

## 2022-12-29 MED ORDER — OXYCODONE HCL 5 MG PO TABS
10.0000 mg | ORAL_TABLET | Freq: Once | ORAL | Status: AC
  Administered 2022-12-29: 10 mg via ORAL
  Filled 2022-12-29: qty 2

## 2022-12-29 MED ORDER — LIDOCAINE HCL (PF) 1 % IJ SOLN
10.0000 mL | Freq: Once | INTRAMUSCULAR | Status: AC
  Administered 2022-12-29: 10 mL via INTRADERMAL
  Filled 2022-12-29: qty 10

## 2022-12-29 NOTE — TOC Progression Note (Addendum)
Transition of Care [facility]) - Progression Note    Patient Details  Name: Tom [name] MRN: [id number] Date of Birth: 1965/07/13  Transition of Care [facility]) CM/SW Contact  Margarito Liner, LCSW Phone Number: 12/29/2022, 12:40 PM  Clinical Narrative:  CSW met with patient and sister at bedside. Sister is here from [place]. Patient is agreeable to home health. If unable to find an agency to accept him, he is agreeable to outpatient PT. [facility] unable to accept. Left messages for [facility] and [facility].   2:14 pm: [facility] and [facility] are unable to accept referral. Will talk to patient about preferred outpatient PT office.  4:00 pm: Went by room to discuss outpatient PT but patient is off the unit.  Expected Discharge Plan: Home/Self Care Barriers to Discharge: Continued Medical Work up  Expected Discharge Plan and Services     Post Acute Care Choice: NA Living arrangements for the past 2 months: Single Family Home                                       Social Determinants of Health (SDOH) Interventions SDOH Screenings   Food Insecurity: No Food Insecurity (12/27/2022)  Housing: Patient Declined (12/27/2022)  Transportation Needs: Unmet Transportation Needs (12/27/2022)  Utilities: Not At Risk (12/27/2022)  Alcohol Screen: Low Risk  (09/21/2022)  Depression (PHQ2-9): Low Risk  (03/21/2022)  Financial Resource Strain: Low Risk  (10/09/2022)  Physical Activity: Unknown (10/09/2022)  Social Connections: Unknown (10/09/2022)  Stress: Stress Concern Present (10/09/2022)  Tobacco Use: Medium Risk (12/27/2022)    Readmission Risk Interventions    12/28/2022   10:29 AM 12/16/2022    5:06 PM  Readmission Risk Prevention Plan  PCP or Specialist Appt within 5-7 Days  Complete  Home Care Screening  --  Medication Review (RN CM)  Complete  PCP or Specialist appointment within 3-5 days of discharge Complete   Palliative  Care Screening Not Applicable   Skilled Nursing Facility Not Applicable

## 2022-12-29 NOTE — Plan of Care (Signed)
  Problem: Activity: Goal: Risk for activity intolerance will decrease Outcome: Progressing   Problem: Nutrition: Goal: Adequate nutrition will be maintained Outcome: Progressing   Problem: Safety: Goal: Ability to remain free from injury will improve Outcome: Progressing   Problem: Skin Integrity: Goal: Risk for impaired skin integrity will decrease Outcome: Progressing   

## 2022-12-29 NOTE — Procedures (Signed)
PROCEDURE SUMMARY:  Successful US guided paracentesis from RUQ. Yielded 7.1 L of clear yellow fluid.  No immediate complications.  Pt tolerated well.   Specimen was not sent for labs.  EBL < 5mL  Pattricia Boss D PA-C 12/29/2022 4:07 PM

## 2022-12-29 NOTE — Progress Notes (Signed)
[facility]  - [place] [facility]   PATIENT NAME: Tom [name]    MR#:  [id number]  DATE OF BIRTH:  Apr 27, 1966  SUBJECTIVE:  patient seen earlier. No family at bedside. Came in with pain in his right foot/ankle. Patient tells me his had chronic swelling of his right leg for several months. He has had several surgeries done on the right ankle at [facility] for joint fusion. Asking for his pain medicine. He gets his pain meds through primary care physician Dr. Andrena Mews. Patient says he is dealing with chronic pain. Discrepancy on how patient is taking pain medicine at home versus what is prescribed to him. I have ordered one additional dose of oxycodone.    VITALS:  Blood pressure 122/66, pulse 89, temperature 98.3 F (36.8 C), temperature source Oral, resp. rate 16, height 6' (1.829 m), weight 116.1 kg, SpO2 97%.  PHYSICAL EXAMINATION:   GENERAL:  57 y.o.-year-old patient with no acute distress.  LUNGS: Normal breath sounds bilaterally, no wheezing CARDIOVASCULAR: S1, S2 normal. No murmur   ABDOMEN: Soft, nontender, nondistended. Bowel sounds present.  EXTREMITIES:  NEUROLOGIC: nonfocal  patient is alert and awake SKIN: as above -- no skin drainage  LABORATORY PANEL:  CBC Recent Labs  Lab 12/27/22 0654  WBC 9.3  HGB 9.5*  HCT 28.9*  PLT 170    Chemistries  Recent Labs  Lab 12/27/22 0654 12/28/22 0746 12/29/22 0518  NA 130* 133*  --   K 3.9 3.6  --   CL 99 102  --   CO2 23 22  --   GLUCOSE 101* 112*  --   BUN 30* 24*  --   CREATININE 1.50* 1.56* 1.37*  CALCIUM 7.8* 7.9*  --   AST 41  --   --   ALT 18  --   --   ALKPHOS 66  --   --   BILITOT 2.5*  --   --    Cardiac Enzymes No results for input(s): "TROPONINI" in the last 168 hours. RADIOLOGY:  No results found.  Assessment and Plan Billy Turvey is a 57 year old male with history of hypertension, hyperlipidemia, end-stage liver disease, alcoholic cirrhosis of the liver, COPD,  GERD, history of right foot pain, history of complication associated with orthopedic device, hyponatremia, who presents to the emergency department for chief concerns of right calf swelling and pain.  He reports his right lower leg has been swollen for a long time and he just noticed that it turned red and became more painful today. He denies trauma to his leg.   Right lower extremity swelling-chronic History multiple orthopedic surgeries right ankle extremity --Patient says he was referred to the hospital by his [facility]opedist concerned for infection. Though patient says swelling and erythema of RLE are not acute. No fever or leukocytosis but does have marked swelling and erythema of the RLE.  - spoke w/ dr. Joice Lofts of ortho who given location defers to podiatry - have consulted podiatry dr Judeth Porch has sign off. --per Podiatry-- Findings on radiographic exam demonstrate chronic erosion and osteomyelitis.  No evidence of acute findings. -I do not see any benefit for additional imaging -Recommend IV ABX and monitor the cellulitis of the next 24-48 hours -No plans for any surgical intervention during this admission.  Follow-up with Dr. Daphane Shepherd post discharge. --pt request ID consultation here--does not want to go to [facility] --?chronic infection -- right leg appears stable. Will change to PO antibiotic tomorrow. Patient worked with physical  therapy recommends home health. TOC unable to get home health agency except patient's insurance. Will try to do outpatient PT.   Cirrhosis with Ascites Patient says abdominal distention is at baseline and declines paracentesis. Has had large volume paracentesis at recent admission. Has been referred to IR for TIPS consideration - cont home lactulose - continue home lasix 40 and spironolactone 25 -- given distention of abdomen will attempt ultrasound-guided paracentesis while patient is here for symptomatic relief    Hyponatremia Chronic, stable, 2/2  cirrhosis - trend   Chronic right ankle pain --will cont po oxycodone as home dose -- recommended patient follow-up outpatient pain clinic  PT OT to see patient--[facility]-. Per TOC not HH will accept his insurance  Procedures: Family communication : none today Consults : podiatry, ID CODE STATUS: full DVT Prophylaxis : Lovenox Level of care: Telemetry Medical Status is: Inpatient Remains inpatient appropriate because: right ankle swelling and pain   if remains stable hoping to discharge tomorrow. Patient in agreement.  TOTAL TIME TAKING CARE OF THIS PATIENT: 35 minutes.  >50% time spent on counselling and coordination of care  Note: This dictation was prepared with Dragon dictation along with smaller phrase technology. Any transcriptional errors that result from this process are unintentional.  Enedina Finner M.D    [facility]   CC: Primary care physician; [name], DO

## 2022-12-29 NOTE — Evaluation (Signed)
Physical Therapy Evaluation Patient Details Name: Tom [name] MRN: [id number] DOB: 1965/10/28 Today's Date: 12/29/2022  History of Present Illness  [name] is a 57 year old male with history of hypertension, hyperlipidemia, end-stage liver disease, alcoholic cirrhosis of the liver, COPD, GERD, history of right foot pain, history of complication associated with orthopedic device, hyponatremia, who presents to the emergency department for chief concerns of right calf swelling and pain.  Clinical Impression  Patient in bed, RN at bedside. Patient reports he is in a lot of pain, can't do much therapy. Reports he needs to go to the bathroom. Patient is mod I with all mobility this session. Bed mobility, transfers and ambulation 40 feet with RW. No lob. He appears to be at his baseline mobility and does not require acute skilled PT, signing off. Patient is requesting home health services.          Assistance Recommended at Discharge PRN  If plan is discharge home, recommend the following:  Can travel by private vehicle  A little help with bathing/dressing/bathroom;Assist for transportation        Equipment Recommendations None recommended by PT  Recommendations for Other Services       Functional Status Assessment Patient has not had a recent decline in their functional status     Precautions / Restrictions Precautions Precautions: None Restrictions Weight Bearing Restrictions: No      Mobility  Bed Mobility Overal bed mobility: Modified Independent             General bed mobility comments: increased time due to ascities    Transfers Overall transfer level: Modified independent Equipment used: Rolling walker (2 wheels)                    Ambulation/Gait Ambulation/Gait assistance: Modified independent (Device/Increase time) Gait Distance (Feet): 40 Feet Assistive device: Rolling walker (2 wheels) Gait Pattern/deviations:  Step-through pattern, Decreased step length - right, Decreased step length - left Gait velocity: decr     General Gait Details: no assistance needed  Stairs            Wheelchair Mobility     Tilt Bed    Modified Rankin (Stroke Patients Only)       Balance Overall balance assessment: Modified Independent                                           Pertinent Vitals/Pain Pain Assessment Pain Assessment: 0-10 Pain Score: 8  Pain Location: R LE Pain Descriptors / Indicators: Discomfort Pain Intervention(s): Repositioned, Premedicated before session    Home Living Family/patient expects to be discharged to:: Private residence Living Arrangements: Spouse/significant other Available Help at Discharge: Family;Available PRN/intermittently Type of Home: House Home Access: Stairs to enter Entrance Stairs-Rails: Right;Left;Can reach both Entrance Stairs-Number of Steps: 4   Home Layout: One level Home Equipment: Agricultural consultant (2 wheels);Shower seat      Prior Function Prior Level of Function : Independent/Modified Independent                     Hand Dominance        Extremity/Trunk Assessment   Upper Extremity Assessment Upper Extremity Assessment: Defer to OT evaluation    Lower Extremity Assessment Lower Extremity Assessment: Generalized weakness;RLE deficits/detail RLE Deficits / Details: large/ swollen R LE RLE: Unable to fully assess due to pain  Cervical / Trunk Assessment Cervical / Trunk Assessment: Normal  Communication   Communication: No difficulties  Cognition Arousal/Alertness: Awake/alert Behavior During Therapy: WFL for tasks assessed/performed Overall Cognitive Status: Within Functional Limits for tasks assessed                                          General Comments      Exercises     Assessment/Plan    PT Assessment Patient does not need any further PT services  PT Problem List          PT Treatment Interventions      PT Goals (Current goals can be found in the Care Plan section)  Acute Rehab PT Goals Patient Stated Goal: to have home health therapy PT Goal Formulation: With patient Time For Goal Achievement: 01/05/23 Potential to Achieve Goals: Good    Frequency       Co-evaluation               AM-PAC PT "6 Clicks" Mobility  Outcome Measure Help needed turning from your back to your side while in a flat bed without using bedrails?: None Help needed moving from lying on your back to sitting on the side of a flat bed without using bedrails?: None Help needed moving to and from a bed to a chair (including a wheelchair)?: None Help needed standing up from a chair using your arms (e.g., wheelchair or bedside chair)?: None Help needed to walk in hospital room?: None Help needed climbing 3-5 steps with a railing? : A Little 6 Click Score: 23    End of Session   Activity Tolerance: Patient limited by pain Patient left: in bed;with call bell/phone within reach Nurse Communication: Mobility status PT Visit Diagnosis: Pain;Other abnormalities of gait and mobility (R26.89) Pain - Right/Left: Right Pain - part of body: Leg    Time: 1610-9604 PT Time Calculation (min) (ACUTE ONLY): 13 min   Charges:   PT Evaluation $PT Eval Low Complexity: 1 Low   PT General Charges $$ ACUTE PT VISIT: 1 Visit         Eveline Sauve, PT, GCS 12/29/22,11:08 AM

## 2022-12-29 NOTE — Evaluation (Signed)
Occupational Therapy Evaluation Patient Details Name: Tom [name] MRN: [id number] DOB: 07/13/1965 Today's Date: 12/29/2022   History of Present Illness [name] is a 57 year old male with history of hypertension, hyperlipidemia, end-stage liver disease, alcoholic cirrhosis of the liver, COPD, GERD, history of right foot pain, history of complication associated with orthopedic device, hyponatremia, who presents to the emergency department for chief concerns of right calf swelling and pain.   Clinical Impression   Pt seen for OT evaluation.  Pt required MOD INDEP With all aspects of mobility, toileting, and dressing, aside from assist requested for donning socks 2/2 abdominal girth from ascites. Pt eager to feel better. Would benefit from additional mobility opportunities in the hospital for improving activity tolerance. Mobility specialists notified.      Recommendations for follow up therapy are one component of a multi-disciplinary discharge planning process, led by the attending physician.  Recommendations may be updated based on patient status, additional functional criteria and insurance authorization.   Assistance Recommended at Discharge PRN  Patient can return home with the following A little help with bathing/dressing/bathroom;Assist for transportation    Functional Status Assessment  Patient has not had a recent decline in their functional status  Equipment Recommendations  None recommended by OT    Recommendations for Other Services       Precautions / Restrictions Precautions Precautions: None Restrictions Weight Bearing Restrictions: No      Mobility Bed Mobility Overal bed mobility: Modified Independent             General bed mobility comments: increased time due to ascities    Transfers Overall transfer level: Modified independent Equipment used: Rolling walker (2 wheels)                      Balance Overall balance  assessment: Modified Independent                                         ADL either performed or assessed with clinical judgement   ADL Overall ADL's : Modified independent                                       General ADL Comments: Mod indep with toilet transfer, pericare, clothing mgt, and dressing. Only required assist for donning socks 2/2 ascites causing abdomen to be distended.     Vision         Perception     Praxis      Pertinent Vitals/Pain Pain Assessment Pain Assessment: 0-10 Pain Score: 8  Pain Location: R LE Pain Descriptors / Indicators: Discomfort Pain Intervention(s): Monitored during session, Premedicated before session, Repositioned     Hand Dominance     Extremity/Trunk Assessment Upper Extremity Assessment Upper Extremity Assessment: Generalized weakness   Lower Extremity Assessment Lower Extremity Assessment: Generalized weakness;RLE deficits/detail RLE Deficits / Details: large/ swollen R LE RLE: Unable to fully assess due to pain   Cervical / Trunk Assessment Cervical / Trunk Assessment: Normal   Communication Communication Communication: No difficulties   Cognition Arousal/Alertness: Awake/alert Behavior During Therapy: WFL for tasks assessed/performed Overall Cognitive Status: Within Functional Limits for tasks assessed  General Comments       Exercises     Shoulder Instructions      Home Living Family/patient expects to be discharged to:: Private residence Living Arrangements: Spouse/significant other Available Help at Discharge: Family;Available PRN/intermittently Type of Home: House Home Access: Stairs to enter [employer] of Steps: 4 Entrance Stairs-Rails: Right;Left;Can reach both Home Layout: One level     Bathroom Shower/Tub: Walk-in shower         Home Equipment: Agricultural consultant (2 wheels);Shower seat           Prior Functioning/Environment Prior Level of Function : Independent/Modified Independent             Mobility Comments: RW          OT Problem List: Decreased strength;Decreased activity tolerance      OT Treatment/Interventions:      OT Goals(Current goals can be found in the care plan section) Acute Rehab OT Goals Patient Stated Goal: get better OT Goal Formulation: All assessment and education complete, DC therapy  OT Frequency:      Co-evaluation              AM-PAC OT "6 Clicks" Daily Activity     Outcome Measure Help from another person eating meals?: None Help from another person taking care of personal grooming?: None Help from another person toileting, which includes using toliet, bedpan, or urinal?: None Help from another person bathing (including washing, rinsing, drying)?: None Help from another person to put on and taking off regular upper body clothing?: None Help from another person to put on and taking off regular lower body clothing?: A Little 6 Click Score: 23   End of Session Equipment Utilized During Treatment: Rolling walker (2 wheels) Nurse Communication: Mobility status  Activity Tolerance: Patient tolerated treatment well Patient left: in bed;with call bell/phone within reach  OT Visit Diagnosis: Other abnormalities of gait and mobility (R26.89);Muscle weakness (generalized) (M62.81)                Time: 1610-9604 OT Time Calculation (min): 13 min Charges:  OT General Charges $OT Visit: 1 Visit OT Evaluation $OT Eval Low Complexity: 1 Low  Arman Filter., MPH, MS, OTR/L ascom [phone] 12/29/22, 11:16 AM

## 2022-12-30 ENCOUNTER — Other Ambulatory Visit: Payer: Self-pay | Admitting: Family Medicine

## 2022-12-30 DIAGNOSIS — Z515 Encounter for palliative care: Secondary | ICD-10-CM

## 2022-12-30 DIAGNOSIS — L03115 Cellulitis of right lower limb: Secondary | ICD-10-CM | POA: Diagnosis not present

## 2022-12-30 DIAGNOSIS — T8484XA Pain due to internal orthopedic prosthetic devices, implants and grafts, initial encounter: Secondary | ICD-10-CM

## 2022-12-30 DIAGNOSIS — G8929 Other chronic pain: Secondary | ICD-10-CM

## 2022-12-30 LAB — CULTURE, BLOOD (ROUTINE X 2)
Culture: NO GROWTH
Culture: NO GROWTH
Special Requests: ADEQUATE

## 2022-12-30 MED ORDER — CEFADROXIL 500 MG PO CAPS: 1000.0000 mg | ORAL_CAPSULE | Freq: Two times a day (BID) | ORAL | 0 refills | Status: AC

## 2022-12-30 NOTE — TOC CM/SW Note (Signed)
     [facility] SERVICES REFERRAL        Occupational Therapy * Physical Therapy * Speech Therapy                           DATE 12/30/2022  PATIENT NAME Tom [name] PATIENT MRN [id number]       DIAGNOSIS/DIAGNOSIS CODE L03.115, T84.9XXA, R18.8, J44.89, K72.10, E66.9  DATE OF DISCHARGE: 12/30/2022       PRIMARY CARE PHYSICIAN     Saralyn Pilar, DO PCP PHONE/FAX P: [phone]     Dear Provider (Name: [facility]  Fax: [phone]   I certify that I have examined this patient and that occupational/physical/speech therapy is necessary on an outpatient basis.    The patient has expressed interest in completing their recommended course of therapy at your  location.  Once a formal order from the patient's primary care physician has been obtained, please  contact him/her to schedule an appointment for evaluation at your earliest convenience.   [x ]  Physical Therapy Evaluate and Treat  [  ]  Occupational Therapy Evaluate and Treat  [  ]  Speech Therapy Evaluate and Treat         The patient's primary care physician (listed above) must furnish and be responsible for a formal order such that the recommended services may be furnished while under the primary physician's care, and that the plan of care will be established and reviewed every 30 days (or more often if condition necessitates).

## 2022-12-30 NOTE — Progress Notes (Signed)
Date of Admission:  12/26/2022   Total days of antibiotics ***        Day ***        Day ***        Day ***   ID: Tom [name] is a 57 y.o. male with  *** Principal Problem:   Cellulitis of right foot Active Problems:   Essential hypertension   Alcoholic cirrhosis of liver with ascites (HCC)   AKI (acute kidney injury) (HCC)   Complication associated with orthopedic device (HCC)   Ascites   Dyslipidemia   COPD with asthma   End stage liver disease (HCC)   Hyponatremia   Obesity (BMI 30-39.9)    Subjective: ***  Medications:   cefadroxil  1,000 mg Oral BID   enoxaparin (LOVENOX) injection  60 mg Subcutaneous Q24H   famotidine  20 mg Oral QPC supper   folic acid  1 mg Oral Daily   furosemide  40 mg Oral Daily   lactulose  10 g Oral Daily   mometasone-formoterol  2 puff Inhalation BID   multivitamin with minerals  1 tablet Oral Daily   pantoprazole  40 mg Oral QAC breakfast   spironolactone  25 mg Oral Daily    Objective: Vital signs in last 24 hours: Patient Vitals for the past 24 hrs:  BP Temp Temp src Pulse Resp SpO2  12/30/22 0512 (!) 110/57 98.2 F (36.8 C) Oral 89 20 95 %  12/29/22 2038 101/62 98.3 F (36.8 C) Oral 95 20 97 %  12/29/22 1541 118/65 -- -- -- -- --  12/29/22 1539 113/70 -- -- 89 18 96 %  12/29/22 1527 123/75 -- -- 90 -- 97 %  12/29/22 1502 124/76 -- -- 90 18 96 %     LDA Foley Central lines Other catheters  PHYSICAL EXAM:  General: Alert, cooperative, no distress, appears stated age.  Head: Normocephalic, without obvious abnormality, atraumatic. Eyes: Conjunctivae clear, anicteric sclerae. Pupils are equal ENT Nares normal. No drainage or sinus tenderness. Lips, mucosa, and tongue normal. No Thrush Neck: Supple, symmetrical, no adenopathy, thyroid: non tender no carotid bruit and no JVD. Back: No CVA tenderness. Lungs: Clear to auscultation bilaterally. No Wheezing or Rhonchi. No rales. Heart: Regular rate and rhythm, no  murmur, rub or gallop. Abdomen: Soft, non-tender,not distended. Bowel sounds normal. No masses Extremities: atraumatic, no cyanosis. No edema. No clubbing Skin: No rashes or lesions. Or bruising Lymph: Cervical, supraclavicular normal. Neurologic: Grossly non-focal  Lab Results    Latest Ref Rng & Units 12/27/2022    6:54 AM 12/26/2022    5:05 PM 12/18/2022    9:06 AM  CBC  WBC 4.0 - 10.5 K/uL 9.3  9.0  8.0   Hemoglobin 13.0 - 17.0 g/dL 9.5  69.6  9.7   Hematocrit 39.0 - 52.0 % 28.9  31.3  28.8   Platelets 150 - 400 K/uL 170  195  174        Latest Ref Rng & Units 12/29/2022    5:18 AM 12/28/2022    7:46 AM 12/27/2022    6:54 AM  CMP  Glucose 70 - 99 mg/dL  295  284   BUN 6 - 20 mg/dL  24  30   Creatinine 1.32 - 1.24 mg/dL 4.40  1.02  7.25   Sodium 135 - 145 mmol/L  133  130   Potassium 3.5 - 5.1 mmol/L  3.6  3.9   Chloride 98 - 111 mmol/L  102  99   CO2 22 - 32 mmol/L  22  23   Calcium 8.9 - 10.3 mg/dL  7.9  7.8   Total Protein 6.5 - 8.1 g/dL   5.7   Total Bilirubin 0.3 - 1.2 mg/dL   2.5   Alkaline Phos 38 - 126 U/L   66   AST 15 - 41 U/L   41   ALT 0 - 44 U/L   18       Microbiology:  Studies/Results: US Paracentesis  Result Date: 12/29/2022 INDICATION: Patient with end-stage liver disease who follows with [facility]. Patient is currently admitted with symptomatic ascites request received for therapeutic paracentesis. EXAM: ULTRASOUND GUIDED  PARACENTESIS MEDICATIONS: Local 1% lidocaine only. COMPLICATIONS: None immediate. PROCEDURE: Informed written consent was obtained from the patient after a discussion of the risks, benefits and alternatives to treatment. A timeout was performed prior to the initiation of the procedure. Initial ultrasound scanning demonstrates a large amount of ascites within the right upper abdominal quadrant. The right upper abdomen was prepped and draped in the usual sterile fashion. 1% lidocaine was used for local anesthesia. Following this, a 19  gauge, 7-cm, Yueh catheter was introduced. An ultrasound image was saved for documentation purposes. The paracentesis was performed. The catheter was removed and a dressing was applied. The patient tolerated the procedure well without immediate post procedural complication. FINDINGS: A total of approximately 7.1 L of clear yellow fluid was removed. IMPRESSION: Successful ultrasound-guided paracentesis yielding 7.1 liters of peritoneal fluid. PLAN: Per chart review, the patient is being followed by [facility] liver [facility]. This exam was performed by Pattricia Boss PA-C, and was supervised and interpreted by Dr. Juliette Alcide. Electronically Signed   By: Olive Bass M.D.   On: 12/29/2022 16:12     Assessment/Plan: ***

## 2022-12-30 NOTE — Progress Notes (Signed)
Tom [name] to be discharged Home per MD order. Discussed prescriptions and follow up appointments with the patient. Prescriptions given to patient, medication list explained in detail. Patient verbalized understanding.  Allergies as of 12/30/2022       Reactions   Alfuzosin Other (See Comments)   Dizziness    Amlodipine Nausea And Vomiting   Bee Venom Swelling   Cymbalta [duloxetine Hcl] Other (See Comments)   Emesis   Duloxetine Other (See Comments)   Emesis   Lisinopril    Flomax [tamsulosin Hcl] Itching        Medication List     STOP taking these medications    atorvastatin 40 MG tablet Commonly known as: LIPITOR   cephALEXin 500 MG capsule Commonly known as: KEFLEX   diclofenac Sodium 1 % Gel Commonly known as: VOLTAREN   lidocaine 5 % Commonly known as: LIDODERM       TAKE these medications    albuterol (2.5 MG/3ML) 0.083% nebulizer solution Commonly known as: PROVENTIL Take 3 mLs (2.5 mg total) by nebulization every 4 (four) hours as needed for wheezing or shortness of breath.   albuterol 108 (90 Base) MCG/ACT inhaler Commonly known as: VENTOLIN HFA Inhale 2 puffs into the lungs every 4 (four) hours as needed.   budesonide-formoterol 80-4.5 MCG/ACT inhaler Commonly known as: Symbicort Take 2 puffs first thing in am and then another 2 puffs about 12 hours later.   cefadroxil 500 MG capsule Commonly known as: DURICEF Take 2 capsules (1,000 mg total) by mouth 2 (two) times daily for 9 days.   DSS 100 MG Caps TAKE 1 CAPSULE (100 MG TOTAL) BY MOUTH TWO (2) TIMES A DAY   EPINEPHrine 0.3 mg/0.3 mL Soaj injection Commonly known as: EpiPen 2-Pak Inject 0.3 mg into the muscle as needed for anaphylaxis.   famotidine 20 MG tablet Commonly known as: Pepcid Take 1 tablet (20 mg total) by mouth daily after supper. One after supper   folic acid 1 MG tablet Commonly known as: FOLVITE Take 1 tablet (1 mg total) by mouth daily.   furosemide 20 MG  tablet Commonly known as: LASIX Take 40 mg by mouth daily.   hydrOXYzine 25 MG capsule Commonly known as: VISTARIL Take 1 capsule (25 mg total) by mouth every 8 (eight) hours as needed.   lactulose 10 GM/15ML solution Commonly known as: CHRONULAC SMARTSIG:15 Milliliter(s) By Mouth Every Other Day Notes to patient: Did not take this morning.    LIVER SUPPORT SL Place under the tongue.   magnesium oxide 400 (240 Mg) MG tablet Commonly known as: MAG-OX Take 2 tablets by mouth 2 (two) times daily.   multivitamin capsule Take 1 capsule by mouth daily.   ondansetron 4 MG disintegrating tablet Commonly known as: ZOFRAN-ODT Take 1 tablet (4 mg total) by mouth every 8 (eight) hours as needed for nausea or vomiting.   Oxycodone HCl 10 MG Tabs Take 1 tablet (10 mg total) by mouth 4 (four) times daily as needed (modereate to severe pain).   pantoprazole 40 MG tablet Commonly known as: Protonix Take 1 tablet (40 mg total) by mouth daily before breakfast.   spironolactone 25 MG tablet Commonly known as: ALDACTONE Take 1 tablet (25 mg total) by mouth daily.   thiamine 100 MG tablet Commonly known as: VITAMIN B1 Take 100 mg by mouth daily.        Vitals:   12/29/22 2038 12/30/22 0512  BP: 101/62 (!) 110/57  Pulse: 95 89  Resp:  20 20  Temp: 98.3 F (36.8 C) 98.2 F (36.8 C)  SpO2: 97% 95%    Skin clean, dry and intact without evidence of skin break down and or skin tears. IV catheter discontinued intact. Site without signs and symptoms of complications. Dressing and pressure applied. Patient denies pain at this time. No complaints noted.  An After Visit Summary was printed and given to the patient. Patient escorted via wheelchair and discharged Home home via private auto.  Madie Reno, RN

## 2022-12-30 NOTE — Telephone Encounter (Signed)
Medication Refill - Medication: Oxycodone HCl 10 MG TABS   Has the patient contacted their pharmacy? No. (Agent: If no, request that the patient contact the pharmacy for the refill. If patient does not wish to contact the pharmacy document the reason why and proceed with request.) (Agent: If yes, when and what did the pharmacy advise?)  Preferred Pharmacy (with phone number or street name):  [id number] - [place], [place] - [address] [place] [address] Phone: [phone]  Fax: [phone]       Has the patient been seen for an appointment in the last year OR does the patient have an upcoming appointment? Yes.    Agent: Please be advised that RX refills may take up to 3 business days. We ask that you follow-up with your pharmacy.

## 2022-12-30 NOTE — Discharge Summary (Signed)
Physician Discharge Summary   Patient: Tom [name] MRN: [id number] DOB: [birth year]  Admit date:     12/26/2022  Discharge date: 12/30/22  Discharge Physician: Enedina Finner   PCP: [name], DO   Recommendations at discharge:   follow-up [facility] orthopedic on your scheduled appointment ambulatory referral for outpatient PT follow-up infectious disease with [facility] on your scheduled July appointment  Discharge Diagnoses: Principal Problem:   Cellulitis of right foot Active Problems:   Complication associated with orthopedic device (HCC)   Hyponatremia   Ascites   AKI (acute kidney injury) (HCC)   Essential hypertension   Dyslipidemia   End stage liver disease (HCC)   Alcoholic cirrhosis of liver with ascites (HCC)   COPD with asthma   Obesity (BMI 30-39.9)  Tom [name] is a 57 year old male with history of hypertension, hyperlipidemia, end-stage liver disease, alcoholic cirrhosis of the liver, COPD, GERD, history of right foot pain, history of complication associated with orthopedic device, hyponatremia, who presents to the emergency department for chief concerns of right calf swelling and pain.  He reports his right lower leg has been swollen for a long time and he just noticed that it turned red and became more painful today. He denies trauma to his leg.    Right lower extremity swelling-chronic History multiple orthopedic surgeries right ankle extremity --Patient says he was referred to the hospital by his [facility] orthopedist concerned for infection. Though patient says swelling and erythema of RLE are not acute. No fever or leukocytosis but does have marked swelling and erythema of the RLE.  - spoke w/ dr. Joice Lofts of ortho who given location defers to podiatry - have consulted podiatry dr Judeth Porch has sign off. --per Podiatry-- Findings on radiographic exam demonstrate chronic erosion and osteomyelitis.  No evidence of acute findings. -Recommend IV ABX and  monitor the cellulitis of the next 24-48 hours -No plans for any surgical intervention during this admission.  Follow-up with Dr. Daphane Shepherd [facility] orthopedic post discharge. --pt request ID consultation here--does not want to go to [facility] --?chronic infection -- right leg appears stable. Will change to PO antibiotic tomorrow. Patient worked with physical therapy recommends home health. TOC unable to get home health agency except patient's insurance. Will try to do outpatient PT. -- Overall feels better. Eager to go home. Recommended [facility] and [facility].    Cirrhosis with Ascites Patient says abdominal distention is at baseline and declines paracentesis. Has had large volume paracentesis at recent admission. Has been referred to IR for TIPS consideration - cont home lactulose - continue home lasix 40 and spironolactone 25 -- given distention of abdomen will attempt ultrasound-guided paracentesis while patient is here for symptomatic relief -- status post ultrasound-guided paracentesis with 7.1 L fluid removal    Hyponatremia Chronic, stable, 2/2 cirrhosis - trend    Chronic right ankle pain --will cont po oxycodone as home dose -- recommended patient follow-up outpatient pain clinic   PT OT to see patient--[facility]-. Per TOC not HH will accept his insurance. Outpatient PT referral made. Discharge plan discussed with patient agreeable with it   Procedures: ultrasound-guided paracentesis Family communication : none today Consults : podiatry, ID CODE STATUS: full DVT Prophylaxis : Lovenox     Pain control -  Controlled Substance Reporting System database was reviewed. and patient was instructed, not to drive, operate heavy machinery, perform activities at heights, swimming or participation in water activities or provide baby-sitting services while on Pain, Sleep and Anxiety  Medications; until their outpatient Physician has advised to do so again. Also recommended  to not to take more than prescribed Pain, Sleep and Anxiety Medications.   Diet recommendation:  Discharge Diet Orders (From admission, onward)     Start     Ordered   12/30/22 0000  Diet - low sodium heart healthy        12/30/22 0945            DISCHARGE MEDICATION: Allergies as of 12/30/2022       Reactions   Alfuzosin Other (See Comments)   Dizziness    Amlodipine Nausea And Vomiting   Bee Venom Swelling   Cymbalta [duloxetine Hcl] Other (See Comments)   Emesis   Duloxetine Other (See Comments)   Emesis   Lisinopril    Flomax [tamsulosin Hcl] Itching        Medication List     STOP taking these medications    atorvastatin 40 MG tablet Commonly known as: LIPITOR   cephALEXin 500 MG capsule Commonly known as: KEFLEX   diclofenac Sodium 1 % Gel Commonly known as: VOLTAREN   lidocaine 5 % Commonly known as: LIDODERM       TAKE these medications    albuterol (2.5 MG/3ML) 0.083% nebulizer solution Commonly known as: PROVENTIL Take 3 mLs (2.5 mg total) by nebulization every 4 (four) hours as needed for wheezing or shortness of breath.   albuterol 108 (90 Base) MCG/ACT inhaler Commonly known as: VENTOLIN HFA Inhale 2 puffs into the lungs every 4 (four) hours as needed.   budesonide-formoterol 80-4.5 MCG/ACT inhaler Commonly known as: Symbicort Take 2 puffs first thing in am and then another 2 puffs about 12 hours later.   cefadroxil 500 MG capsule Commonly known as: DURICEF Take 2 capsules (1,000 mg total) by mouth 2 (two) times daily for 9 days.   DSS 100 MG Caps TAKE 1 CAPSULE (100 MG TOTAL) BY MOUTH TWO (2) TIMES A DAY   EPINEPHrine 0.3 mg/0.3 mL Soaj injection Commonly known as: EpiPen 2-Pak Inject 0.3 mg into the muscle as needed for anaphylaxis.   famotidine 20 MG tablet Commonly known as: Pepcid Take 1 tablet (20 mg total) by mouth daily after supper. One after supper   folic acid 1 MG tablet Commonly known as: FOLVITE Take 1  tablet (1 mg total) by mouth daily.   furosemide 20 MG tablet Commonly known as: LASIX Take 40 mg by mouth daily.   hydrOXYzine 25 MG capsule Commonly known as: VISTARIL Take 1 capsule (25 mg total) by mouth every 8 (eight) hours as needed.   lactulose 10 GM/15ML solution Commonly known as: CHRONULAC SMARTSIG:15 Milliliter(s) By Mouth Every Other Day   LIVER SUPPORT SL Place under the tongue.   magnesium oxide 400 (240 Mg) MG tablet Commonly known as: MAG-OX Take 2 tablets by mouth 2 (two) times daily.   multivitamin capsule Take 1 capsule by mouth daily.   ondansetron 4 MG disintegrating tablet Commonly known as: ZOFRAN-ODT Take 1 tablet (4 mg total) by mouth every 8 (eight) hours as needed for nausea or vomiting.   Oxycodone HCl 10 MG Tabs Take 1 tablet (10 mg total) by mouth 4 (four) times daily as needed (modereate to severe pain).   pantoprazole 40 MG tablet Commonly known as: Protonix Take 1 tablet (40 mg total) by mouth daily before breakfast.   spironolactone 25 MG tablet Commonly known as: ALDACTONE Take 1 tablet (25 mg total) by mouth daily.   thiamine  100 MG tablet Commonly known as: VITAMIN B1 Take 100 mg by mouth daily.        Follow-up Information     [name], DO. Schedule an appointment as soon as possible for a visit.   Specialty: Family Medicine Why: hospital f/u Contact information: [address] [place] [address] [phone]         Oswald Hillock, MD. Go to.   Specialty: Orthopedic Surgery Why: in 1-2 weeks Contact information: [address] [place] [address] [phone]                Discharge Exam: Ceasar Mons Weights   12/26/22 1952  Weight: 116.1 kg    On the day of discharge  Condition at discharge: fair  The results of significant diagnostics from this hospitalization (including imaging, microbiology, ancillary and laboratory) are listed below for reference.   Imaging  Studies: US Paracentesis  Result Date: 12/29/2022 INDICATION: Patient with end-stage liver disease who follows with [facility] liver center. Patient is currently admitted with symptomatic ascites request received for therapeutic paracentesis. EXAM: ULTRASOUND GUIDED  PARACENTESIS MEDICATIONS: Local 1% lidocaine only. COMPLICATIONS: None immediate. PROCEDURE: Informed written consent was obtained from the patient after a discussion of the risks, benefits and alternatives to treatment. A timeout was performed prior to the initiation of the procedure. Initial ultrasound scanning demonstrates a large amount of ascites within the right upper abdominal quadrant. The right upper abdomen was prepped and draped in the usual sterile fashion. 1% lidocaine was used for local anesthesia. Following this, a 19 gauge, 7-cm, Yueh catheter was introduced. An ultrasound image was saved for documentation purposes. The paracentesis was performed. The catheter was removed and a dressing was applied. The patient tolerated the procedure well without immediate post procedural complication. FINDINGS: A total of approximately 7.1 L of clear yellow fluid was removed. IMPRESSION: Successful ultrasound-guided paracentesis yielding 7.1 liters of peritoneal fluid. PLAN: Per chart review, the patient is being followed by [facility] liver center. This exam was performed by Pattricia Boss PA-C, and was supervised and interpreted by Dr. Juliette Alcide. Electronically Signed   By: Olive Bass M.D.   On: 12/29/2022 16:12   DG Foot Complete Right  Result Date: 12/26/2022 CLINICAL DATA:  Fall.  Right knee and foot pain. EXAM: RIGHT FOOT COMPLETE - 3+ VIEW COMPARISON:  CT 11/15/2022 FINDINGS: Again seen periosteal reaction involving the distal tibia extending to the ankle joint, no significant change from prior CT. Portions of screws in the distal tibia. Two screws traverse the talocalcaneal joint. Lucency adjacent to 1 of the talocalcaneal screws measures up to 4 cm.  This is also unchanged from prior exam. Moderate talonavicular spurring. Mild degenerative change of the first metatarsal phalangeal joint. There is no evidence of acute fracture. Marked generalized soft tissue edema. Scattered punctate densities in the soft tissues, chronic. IMPRESSION: 1. No acute fracture of the right foot. 2. Marked generalized soft tissue edema. 3. 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 Postsurgical change in the distal tibia and talocalcaneal fusion. Lucency adjacent to the talocalcaneal screws is also unchanged. Differential considerations are loosening and infection. Electronically Signed   By: Narda Rutherford M.D.   On: 12/26/2022 18:16   DG Knee Complete 4 Views Right  Result Date: 12/26/2022 CLINICAL DATA:  Fall, right knee and foot pain. EXAM: RIGHT KNEE - COMPLETE 4+ VIEW COMPARISON:  None Available. FINDINGS: Right knee arthroplasty. No acute  or periprosthetic fracture. There is no periprosthetic lucency. No joint effusion. Prior patellar resurfacing. No significant knee joint effusion. Generalized soft tissue edema. No soft tissue gas. IMPRESSION: 1. Right knee arthroplasty without complication. No acute fracture. 2. Soft tissue edema. Electronically Signed   By: Narda Rutherford M.D.   On: 12/26/2022 18:13   US Paracentesis  Result Date: 12/16/2022 INDICATION: Ascites EXAM: ULTRASOUND GUIDED  PARACENTESIS MEDICATIONS: None. COMPLICATIONS: None immediate. PROCEDURE: Informed written consent was obtained from the patient after a discussion of the risks, benefits and alternatives to treatment. A timeout was performed prior to the initiation of the procedure. Initial ultrasound scanning demonstrates a large amount of ascites within the right lower abdominal quadrant. The right lower abdomen was prepped and draped in the usual sterile fashion. 1% lidocaine was used for local anesthesia. Following this, a 19  gauge, 7-cm, Yueh catheter was introduced. An ultrasound image was saved for documentation purposes. The paracentesis was performed. The catheter was removed and a dressing was applied. The patient tolerated the procedure well without immediate post procedural complication. FINDINGS: A total of approximately 13.9 L of clear peritoneal fluid was removed. IMPRESSION: Successful ultrasound-guided paracentesis yielding 13.9 liters of clear ascites. Electronically Signed   By: Olive Bass M.D.   On: 12/16/2022 16:29   DG Chest 2 View  Result Date: 12/15/2022 CLINICAL DATA:  Shortness of breath EXAM: CHEST - 2 VIEW COMPARISON:  12/03/2022 FINDINGS: Unchanged cardiac and mediastinal contours. Redemonstrated bilateral calcified nodules. No new focal pulmonary opacity. No pleural effusion or pneumothorax. No acute osseous abnormality. Redemonstrated plate and screw fixation of the mid to distal right clavicle. IMPRESSION: No active cardiopulmonary disease. Electronically Signed   By: Wiliam Ke M.D.   On: 12/15/2022 19:50   US Paracentesis  Result Date: 12/05/2022 INDICATION: Patient with a history of cirrhosis with recurrent ascites. Interventional radiology asked to perform a diagnostic and therapeutic paracentesis. EXAM: ULTRASOUND GUIDED PARACENTESIS MEDICATIONS: 1% lidocaine 20 mL COMPLICATIONS: None immediate. PROCEDURE: Informed written consent was obtained from the patient after a discussion of the risks, benefits and alternatives to treatment. A timeout was performed prior to the initiation of the procedure. Initial ultrasound scanning demonstrates a large amount of ascites within the right lower abdominal quadrant. The right lower abdomen was prepped and draped in the usual sterile fashion. 1% lidocaine was used for local anesthesia. Following this, a 19 gauge, 7-cm, Yueh catheter was introduced. An ultrasound image was saved for documentation purposes. The paracentesis was performed. After  approximately 1 L the patient inadvertently dislodged the Yueh and bright red blood was being pulled into the catheter. The Griffith Citron was removed. Ultrasound imaging demonstrated large volume ascites and the site was re-prepped and a new 7-cm Griffith Citron was inserted into the same puncture site with return of clear yellow peritoneal fluid. After approximately another liter of fluid removal the catheter stopped draining. Ultrasound imaging demonstrated large volume ascites and a new area in the right lower quadrant was prepped and draped in the normal sterile fashion. This new site was anesthetized with 1% lidocaine and a 10 cm Yueh was inserted. The paracentesis was performed. The catheter was removed and a dressing was applied. The patient tolerated the procedure well without immediate post procedural complication. FINDINGS: A total of approximately 6.9 L of clear yellow fluid was removed. Samples were sent to the laboratory as requested by the clinical team. IMPRESSION: Successful ultrasound-guided paracentesis yielding 6.9 liters of peritoneal fluid. Procedure performed by Alwyn Ren NP and supervised  by Dr. Archer Asa. PLAN: If the patient eventually requires >/=2 paracenteses in a 30 day period, candidacy for formal evaluation by the [facility] will be assessed. Electronically Signed   By: Malachy Moan M.D.   On: 12/05/2022 15:25   DG Chest Port 1 View  Result Date: 12/03/2022 CLINICAL DATA:  Shortness of breath EXAM: PORTABLE CHEST 1 VIEW COMPARISON:  Chest x-ray 12/02/2021 FINDINGS: Bilateral calcified nodules are again seen. There is no focal lung infiltrate, pleural effusion or pneumothorax. The cardiomediastinal silhouette is within normal limits. No acute fractures are seen. Orthopedic plate is seen in the right clavicle. IMPRESSION: 1. No active disease. 2. Bilateral calcified pulmonary nodules. Electronically Signed   By: Darliss Cheney M.D.   On:  12/03/2022 22:18    Microbiology: Results for orders placed or performed during the hospital encounter of 12/26/22  Blood culture (routine x 2)     Status: None (Preliminary result)   Collection Time: 12/26/22  5:05 PM   Specimen: BLOOD  Result Value Ref Range Status   Specimen Description BLOOD RIGHT ANTECUBITAL  Final   Special Requests   Final    BOTTLES DRAWN AEROBIC AND ANAEROBIC Blood Culture adequate volume   Culture   Final    NO GROWTH 4 DAYS Performed at [facility], [address] [place], [place] [address]    Report Status PENDING  Incomplete  Blood culture (routine x 2)     Status: None (Preliminary result)   Collection Time: 12/26/22  7:50 PM   Specimen: BLOOD  Result Value Ref Range Status   Specimen Description BLOOD LEFT ANTECUBITAL  Final   Special Requests   Final    BOTTLES DRAWN AEROBIC AND ANAEROBIC Blood Culture adequate volume   Culture   Final    NO GROWTH 4 DAYS Performed at [facility], [address] [place], [place] [address]    Report Status PENDING  Incomplete    Labs: CBC: Recent Labs  Lab 12/26/22 1705 12/27/22 0654  WBC 9.0 9.3  NEUTROABS 4.7  --   HGB 10.2* 9.5*  HCT 31.3* 28.9*  MCV 85.3 85.5  PLT 195 170   Basic Metabolic Panel: Recent Labs  Lab 12/26/22 1705 12/27/22 0654 12/28/22 0746 12/29/22 0518  NA 132* 130* 133*  --   K 4.1 3.9 3.6  --   CL 100 99 102  --   CO2 25 23 22   --   GLUCOSE 167* 101* 112*  --   BUN 32* 30* 24*  --   CREATININE 1.82* 1.50* 1.56* 1.37*  CALCIUM 8.4* 7.8* 7.9*  --    Liver Function Tests: Recent Labs  Lab 12/27/22 0654  AST 41  ALT 18  ALKPHOS 66  BILITOT 2.5*  PROT 5.7*  ALBUMIN 2.0*   CBG: Recent Labs  Lab 12/27/22 1838  GLUCAP 141*    Discharge time spent: greater than 30 minutes.  Signed: Enedina Finner, MD [facility] 12/30/2022

## 2022-12-30 NOTE — Plan of Care (Signed)
Problem: Health Behavior/Discharge Planning: Goal: Ability to manage health-related needs will improve Outcome: Progressing  Patient ready for discharge.   Madie Reno, RN

## 2022-12-30 NOTE — TOC Transition Note (Signed)
Transition of Care [facility]) - CM/SW Discharge Note   Patient Details  Name: Tom [name] MRN: [id number] Date of Birth: Jul 10, 1965  [facility] CM/SW Contact:  Margarito Liner, LCSW Phone Number: 12/30/2022, 11:23 AM   Clinical Narrative:   Patient has orders to discharge home today. He prefers [facility] for outpatient PT. CSW faxed order form. No further concerns. CSW signing off.  Final next level of care: OP Rehab Barriers to Discharge: Barriers Resolved   Patient Goals and CMS Choice   Choice offered to / list presented to : Patient  Discharge Placement                    Name of family member notified: Sister at bedside Patient and family notified of of transfer: 12/30/22  Discharge Plan and Services Additional resources added to the After Visit Summary for       Post Acute Care Choice: NA                               Social Determinants of Health (SDOH) Interventions SDOH Screenings   Food Insecurity: No Food Insecurity (12/27/2022)  Housing: Patient Declined (12/27/2022)  Transportation Needs: Unmet Transportation Needs (12/27/2022)  Utilities: Not At Risk (12/27/2022)  Alcohol Screen: Low Risk  (09/21/2022)  Depression (PHQ2-9): Low Risk  (03/21/2022)  Financial Resource Strain: Low Risk  (10/09/2022)  Physical Activity: Unknown (10/09/2022)  Social Connections: Unknown (10/09/2022)  Stress: Stress Concern Present (10/09/2022)  Tobacco Use: Medium Risk (12/27/2022)     Readmission Risk Interventions    12/28/2022   10:29 AM 12/16/2022    5:06 PM  Readmission Risk Prevention Plan  PCP or Specialist Appt within 5-7 Days  Complete  Home Care Screening  --  Medication Review (RN CM)  Complete  PCP or Specialist appointment within 3-5 days of discharge Complete   Palliative Care Screening Not Applicable   Skilled Nursing Facility Not Applicable

## 2022-12-30 NOTE — Telephone Encounter (Signed)
Requested medications are due for refill today.  Provider to review  Requested medications are on the active medications list.  yes  Last refill. 12/19/2022 #60 0 rf  Future visit scheduled.   yes  Notes to clinic.  Refill not delegated.    Requested Prescriptions  Pending Prescriptions Disp Refills   Oxycodone HCl 10 MG TABS 60 tablet 0    Sig: Take 1 tablet (10 mg total) by mouth 4 (four) times daily as needed (modereate to severe pain).     Not Delegated - Analgesics:  Opioid Agonists Failed - 12/30/2022  2:54 PM      Failed - This refill cannot be delegated      Failed - Urine Drug Screen completed in last 360 days      Passed - Valid encounter within last 3 months    Recent Outpatient Visits           3 weeks ago No-show for appointment   [place] [facility] [name], DO   1 month ago Chronic foot pain, right   [place] [facility] [place], [name], DO   2 months ago Alcoholic cirrhosis of liver with ascites [facility])   [place] [facility] [place], [name], DO   2 months ago Alcoholic cirrhosis of liver with ascites [facility])   [place] [facility] [place], [name], DO   3 months ago Alcoholic cirrhosis of liver with ascites [facility])   [place] [facility] Althea Charon, [name], DO       Future Appointments             In 6 days Althea Charon, [name], DO  [facility], [facility]

## 2022-12-31 DIAGNOSIS — L03115 Cellulitis of right lower limb: Principal | ICD-10-CM

## 2022-12-31 LAB — CULTURE, BLOOD (ROUTINE X 2): Special Requests: ADEQUATE

## 2023-01-01 ENCOUNTER — Other Ambulatory Visit: Payer: Self-pay | Admitting: Family Medicine

## 2023-01-01 DIAGNOSIS — G8929 Other chronic pain: Secondary | ICD-10-CM

## 2023-01-01 DIAGNOSIS — Z515 Encounter for palliative care: Secondary | ICD-10-CM

## 2023-01-01 DIAGNOSIS — T8484XA Pain due to internal orthopedic prosthetic devices, implants and grafts, initial encounter: Secondary | ICD-10-CM

## 2023-01-02 ENCOUNTER — Ambulatory Visit: Payer: Self-pay | Admitting: *Deleted

## 2023-01-02 ENCOUNTER — Telehealth: Payer: Self-pay | Admitting: *Deleted

## 2023-01-02 ENCOUNTER — Other Ambulatory Visit: Payer: Self-pay | Admitting: Family Medicine

## 2023-01-02 ENCOUNTER — Ambulatory Visit: Payer: Self-pay

## 2023-01-02 DIAGNOSIS — Z515 Encounter for palliative care: Secondary | ICD-10-CM

## 2023-01-02 DIAGNOSIS — G8929 Other chronic pain: Secondary | ICD-10-CM

## 2023-01-02 DIAGNOSIS — T8484XA Pain due to internal orthopedic prosthetic devices, implants and grafts, initial encounter: Secondary | ICD-10-CM

## 2023-01-02 LAB — MISC LABCORP TEST (SEND OUT): Labcorp test code: 9985

## 2023-01-02 NOTE — Transitions of Care (Post Inpatient/ED Visit) (Signed)
01/02/2023  Name: Tom [name] MRN: [id number] DOB: [birth year]  Today's TOC FU Call Status: Today's TOC FU Call Status:: Successful TOC FU Call Competed TOC FU Call Complete Date: 01/02/23  Transition Care Management Follow-up Telephone Call Date of Discharge: 12/30/22 Discharge Facility: [facility]) Type of Discharge: Inpatient Admission Primary Inpatient Discharge Diagnosis:: cellulitis of right foot How have you been since you were released from the hospital?: Worse Any questions or concerns?: Yes Patient Questions/Concerns:: Patient is unable to get any pain medications  Items Reviewed: Did you receive and understand the discharge instructions provided?: Yes Dietary orders reviewed?: NA Do you have support at home?: Yes  Medications Reviewed Today: Medications Reviewed Today     Reviewed by [name], CPhT (Pharmacy Technician) on 12/27/22 at 0212  Med List Status: Complete   Medication Order Taking? Sig Documenting Provider Last Dose Status Informant  albuterol (PROVENTIL) (2.5 MG/3ML) 0.083% nebulizer solution [id number] Yes Take 3 mLs (2.5 mg total) by nebulization every 4 (four) hours as needed for wheezing or shortness of breath. [name], MD prn Active Self  albuterol (VENTOLIN HFA) 108 (90 Base) MCG/ACT inhaler [id number] Yes Inhale 2 puffs into the lungs every 4 (four) hours as needed. [provider] prn Active Self  atorvastatin (LIPITOR) 40 MG tablet [id number] No Take 1 tablet (40 mg total) by mouth daily.  Patient not taking: Reported on 12/27/2022   [name], DO Not Taking Active Self  budesonide-formoterol [facility]) 80-4.5 MCG/ACT inhaler [id number] Yes Take 2 puffs first thing in am and then another 2 puffs about 12 hours later. [name], DO 12/26/2022 Active Self  cephALEXin (KEFLEX) 500 MG capsule [id number] Yes Take 500 mg by mouth 4 (four) times daily. [provider] 12/26/2022 Active Self  diclofenac Sodium (VOLTAREN) 1 % GEL [id number] No Apply 4 g topically 4 (four) times daily.  Patient not taking: Reported on 12/27/2022   [name], MD Not Taking Active Self  Docusate Sodium (DSS) 100 MG CAPS [id number] Yes TAKE 1 CAPSULE (100 MG TOTAL) BY MOUTH TWO (2) TIMES A DAY [provider] 12/26/2022 Active Self  EPINEPHrine (EPIPEN 2-PAK) 0.3 mg/0.3 mL IJ SOAJ injection [id number] Yes Inject 0.3 mg into the muscle as needed for anaphylaxis. [name], DO prn Active Self  famotidine (PEPCID) 20 MG tablet [id number] Yes Take 1 tablet (20 mg total) by mouth daily after supper. One after supper [name], DO 12/26/2022 Active Self  folic acid (FOLVITE) 1 MG tablet [id number] No Take 1 tablet (1 mg total) by mouth daily.  Patient not taking: Reported on 12/27/2022   [name], DO Not Taking Active Self  furosemide (LASIX) 20 MG tablet [id number] Yes Take 40 mg by mouth daily. [provider] unknown Active Self           Med Note [name]   Tue Dec 27, 2022  2:09 AM) Patient does not take every day.  Homeopathic Products (LIVER SUPPORT SL) [id number] Yes Place under the tongue. [provider] 12/26/2022 Active Self  hydrOXYzine (VISTARIL) 25 MG capsule [id number] Yes Take 1 capsule (25 mg total) by mouth every 8 (eight) hours as needed. [name], DO 12/26/2022 Active Self  lactulose (CHRONULAC) 10 GM/15ML solution [id number] Yes SMARTSIG:15 Milliliter(s) By Mouth Every Other Day [name], DO 12/26/2022 Active Self           Med Note (NOON, NANCY J  Tue Dec 27, 2022  2:03 AM) Patient takes every day.  lidocaine (LIDODERM) 5 % [id number] No Place 1 patch onto the skin daily.  Patient not taking: Reported on 12/27/2022   [provider] Not Taking Active Self  magnesium oxide (MAG-OX) 400 (240 Mg) MG tablet [id number] Yes Take 2 tablets by mouth 2 (two)  times daily. [provider] 12/26/2022 Active Self  Multiple Vitamin (MULTIVITAMIN) capsule [id number] Yes Take 1 capsule by mouth daily. [provider] 12/26/2022 Active Self  ondansetron (ZOFRAN-ODT) 4 MG disintegrating tablet [id number] Yes Take 1 tablet (4 mg total) by mouth every 8 (eight) hours as needed for nausea or vomiting. [name], DO 12/26/2022 Active Self  Oxycodone HCl 10 MG TABS [id number] Yes Take 1 tablet (10 mg total) by mouth 4 (four) times daily as needed (modereate to severe pain). [name], DO 12/26/2022 Active Self  pantoprazole (PROTONIX) 40 MG tablet [id number] Yes Take 1 tablet (40 mg total) by mouth daily before breakfast. [name], DO 12/26/2022 Active Self  spironolactone (ALDACTONE) 25 MG tablet [id number] Yes Take 1 tablet (25 mg total) by mouth daily. [name], DO 12/26/2022 Active Self  thiamine (VITAMIN B1) 100 MG tablet [id number] Yes Take 100 mg by mouth daily. [provider] 12/26/2022 Active Self            Home Care and Equipment/Supplies:    Functional Questionnaire:    Follow up appointments reviewed:   Successful TOC with Tom [name]. Patient is upset that he is unable to get a refill of pain medication today. He is in pain and headed to the ED for pain management. RNCM unable to complete TOC outreach. Patient reports having everything that he needs with the exception of pain medication. Call was ended by patient.   [name] RN, BSN [place]  Managed [facility] RN Care Coordinator [phone]

## 2023-01-02 NOTE — Telephone Encounter (Signed)
Requested medication (s) are due for refill today: Yes  Requested medication (s) are on the active medication list:Yes  Last refill:  12/19/22  Future visit scheduled:Yes  Notes to clinic:  Unable to refill per protocol, cannot delegate.      Requested Prescriptions  Pending Prescriptions Disp Refills   Oxycodone HCl 10 MG TABS 60 tablet 0    Sig: Take 1 tablet (10 mg total) by mouth 4 (four) times daily as needed (modereate to severe pain).     Not Delegated - Analgesics:  Opioid Agonists Failed - 01/02/2023 11:27 AM      Failed - This refill cannot be delegated      Failed - Urine Drug Screen completed in last 360 days      Passed - Valid encounter within last 3 months    Recent Outpatient Visits           3 weeks ago No-show for appointment   [place] [facility] [name], DO   1 month ago Chronic foot pain, right   [place] [facility] [place], [name], DO   2 months ago Alcoholic cirrhosis of liver with ascites [facility])   [place] [facility], [name], DO   2 months ago Alcoholic cirrhosis of liver with ascites [facility])   [facility] [place], [name], DO   3 months ago Alcoholic cirrhosis of liver with ascites [facility])   [place] [facility] Althea Charon, [name], DO       Future Appointments             In 3 days Althea Charon, [name], DO [facility] [facility], [facility]

## 2023-01-02 NOTE — Telephone Encounter (Signed)
See pt message regarding Oxycodone HCI 10 mg.  Message forwarded to Dr Althea Charon since it is a non delegated medication.

## 2023-01-02 NOTE — Telephone Encounter (Signed)
Chief Complaint: foot pain Symptoms: right chronic foot pain 10/10 on pain scale  Frequency: Chronic Pertinent Negatives: Patient denies all other symptoms Disposition: [] ED /[] Urgent Care (no appt availability in office) / [x] Appointment(In office/virtual)/ []  [place] [facility]/ [] Home Care/ [] Refused Recommended Disposition /[] [place] [facility]/ [x]  Follow-up with PCP Additional Notes: Patient stated that he is completely out of pain medication and the chronic pain in his right foot is a 10/10 on the pain scale right now. Patient stated he just wants a refill on is medication and was told it is pending. Patient stated he does not know what pending means. I explained the office received his pain medication request yesterday and it was sent to his provider this morning at 0910. At this time we are awaiting the provider's approval to refill the medication. Patient verbalized understanding. Patient wanted to let provider know he is completely out and requested that it be filled as soon as possible. Advised patient I would forward the message to the provider.  Reason for Disposition  Foot pain is a chronic symptom (recurrent or ongoing AND present > 4 weeks)  Answer Assessment - Initial Assessment Questions 1. ONSET: "When did the pain start?"      Ongoing chronic pain 2. LOCATION: "Where is the pain located?"      Right foot  3. PAIN: "How bad is the pain?"    (Scale 1-10; or mild, moderate, severe)  - MILD (1-3): doesn't interfere with normal activities.   - MODERATE (4-7): interferes with normal activities (e.g., work or school) or awakens from sleep, limping.   - SEVERE (8-10): excruciating pain, unable to do any normal activities, unable to walk.      10/10 4. WORK OR EXERCISE: "Has there been any recent work or exercise that involved this part of the body?"      No 5. CAUSE: "What do you think is causing the foot pain?"     I have chronic foot pain and Im out of medication 6.  OTHER SYMPTOMS: "Do you have any other symptoms?" (e.g., leg pain, rash, fever, numbness)     No  Protocols used: Foot Pain-A-AH

## 2023-01-02 NOTE — Telephone Encounter (Signed)
He should not be out of this medication.  He was hospitalized for 4 days and should have not taken his home oxycodone during that time.  He should still have at least another 3 days of medication.  Dr. Kirtland Bouchard will be back tomorrow and can make a decision about refill on this medication if deemed appropriate.

## 2023-01-02 NOTE — Telephone Encounter (Signed)
Medication Refill - Medication: Oxycodone HCl 10 MG TABS  Was denied because says requested too sooon, and today is the day to request  Has the patient contacted their pharmacy? yes (Agent: If no, request that the patient contact the pharmacy for the refill. If patient does not wish to contact the pharmacy document the reason why and proceed with request.) (Agent: If yes, when and what did the pharmacy advise?)contact pcp  Preferred Pharmacy (with phone number or street name): [id number] [place] [place] - [address] [place] [address] Phone: [phone]  Fax: [phone]  Has the patient been seen for an appointment in the last year OR does the patient have an upcoming appointment? yes  Agent: Please be advised that RX refills may take up to 3 business days. We ask that you follow-up with your pharmacy.

## 2023-01-02 NOTE — Telephone Encounter (Signed)
He should not be out of this medication.  He was hospitalized for 4 days during which time he should not have taken his home prescription oxycodone.  He should still have 4 days left.  Dr. Kirtland Bouchard will be back in the office tomorrow and can decide on a refill if he deems appropriate.

## 2023-01-02 NOTE — Telephone Encounter (Signed)
Message from [place] M sent at 01/02/2023 11:47 AM EDT  Summary: Rx concern   Pt insists on speaking with a nurse regarding his Rx for Oxycodone HCl 10 MG TABS. Pt informed that the Rx was sent to his pharmacy on 12/19/22. Pt disputes this and stated that he only received a 15 day supply. Cb# [phone]          Call History  Contact Date/Time Type Contact Phone/Fax User  01/02/2023 11:45 AM EDT Phone (Incoming) [name], Tom Shanks (Self) [phone] Judie Petit) Marylen Ponto

## 2023-01-03 ENCOUNTER — Telehealth: Payer: Self-pay

## 2023-01-03 ENCOUNTER — Other Ambulatory Visit: Payer: Self-pay | Admitting: Family Medicine

## 2023-01-03 DIAGNOSIS — Z515 Encounter for palliative care: Secondary | ICD-10-CM

## 2023-01-03 DIAGNOSIS — T8484XA Pain due to internal orthopedic prosthetic devices, implants and grafts, initial encounter: Secondary | ICD-10-CM

## 2023-01-03 DIAGNOSIS — G8929 Other chronic pain: Secondary | ICD-10-CM

## 2023-01-03 MED ORDER — OXYCODONE HCL 10 MG PO TABS
10.0000 mg | ORAL_TABLET | Freq: Four times a day (QID) | ORAL | 0 refills | Status: DC | PRN
Start: 2023-01-03 — End: 2023-01-20

## 2023-01-03 NOTE — Telephone Encounter (Signed)
  Just FYI my thoughts  Yes he is on pain contract with me.  I am not sure about the medication being ready before it is due. That is not part of the contract.  The delay on this order was that I was out of town on [place]. He was in hospital for a period of time and therefore, the thought was that he would have enough pills to last until I returned today on Tuesday 7/16  If he is not satisfied with his care, that is up to him if he chooses to change.  I did re order the medicine today to solve this issue when I returned to work today.  Saralyn Pilar, DO [facility] 01/03/2023, 1:48 PM

## 2023-01-03 NOTE — Telephone Encounter (Signed)
Copied from CRM [id number]. Topic: General - Other >> Jan 03, 2023 11:27 AM Dondra Prader E wrote: Reason for CRM: Pt called and states "that this was supposed to be a contract deal where it would be ready for him even before it was due" regarding his pain medication. He says he does not have faith anymore in his PCP/[facility].

## 2023-01-05 ENCOUNTER — Inpatient Hospital Stay: Payer: Medicaid Other | Admitting: Family Medicine

## 2023-01-05 DIAGNOSIS — M86671 Other chronic osteomyelitis, right ankle and foot: Secondary | ICD-10-CM | POA: Diagnosis not present

## 2023-01-05 DIAGNOSIS — L03119 Cellulitis of unspecified part of limb: Secondary | ICD-10-CM | POA: Diagnosis not present

## 2023-01-06 ENCOUNTER — Emergency Department
Admission: EM | Admit: 2023-01-06 | Discharge: 2023-01-06 | Disposition: A | Discharge status: Home / Self Care | Payer: Medicaid Other | Source: Home / Self Care

## 2023-01-06 ENCOUNTER — Telehealth: Payer: Self-pay | Admitting: Family Medicine

## 2023-01-06 ENCOUNTER — Emergency Department: Payer: Medicaid Other

## 2023-01-06 ENCOUNTER — Other Ambulatory Visit: Payer: Self-pay

## 2023-01-06 DIAGNOSIS — Z5321 Procedure and treatment not carried out due to patient leaving prior to being seen by health care provider: Secondary | ICD-10-CM | POA: Diagnosis not present

## 2023-01-06 DIAGNOSIS — J9811 Atelectasis: Secondary | ICD-10-CM | POA: Diagnosis not present

## 2023-01-06 DIAGNOSIS — R0602 Shortness of breath: Secondary | ICD-10-CM | POA: Insufficient documentation

## 2023-01-06 DIAGNOSIS — K7460 Unspecified cirrhosis of liver: Secondary | ICD-10-CM | POA: Diagnosis not present

## 2023-01-06 DIAGNOSIS — T8484XA Pain due to internal orthopedic prosthetic devices, implants and grafts, initial encounter: Secondary | ICD-10-CM

## 2023-01-06 DIAGNOSIS — M7989 Other specified soft tissue disorders: Secondary | ICD-10-CM | POA: Diagnosis not present

## 2023-01-06 DIAGNOSIS — G8929 Other chronic pain: Secondary | ICD-10-CM

## 2023-01-06 DIAGNOSIS — Z515 Encounter for palliative care: Secondary | ICD-10-CM

## 2023-01-06 LAB — CBC WITH DIFFERENTIAL/PLATELET
Abs Immature Granulocytes: 0.03 10*3/uL (ref 0.00–0.07)
Basophils Absolute: 0.2 10*3/uL — ABNORMAL HIGH (ref 0.0–0.1)
Basophils Relative: 2 %
Eosinophils Absolute: 0.2 10*3/uL (ref 0.0–0.5)
Eosinophils Relative: 3 %
HCT: 27.9 % — ABNORMAL LOW (ref 39.0–52.0)
Hemoglobin: 9.4 g/dL — ABNORMAL LOW (ref 13.0–17.0)
Immature Granulocytes: 0 %
Lymphocytes Relative: 18 %
Lymphs Abs: 1.6 10*3/uL (ref 0.7–4.0)
MCH: 28.1 pg (ref 26.0–34.0)
MCHC: 33.7 g/dL (ref 30.0–36.0)
MCV: 83.5 fL (ref 80.0–100.0)
Monocytes Absolute: 1.2 10*3/uL — ABNORMAL HIGH (ref 0.1–1.0)
Monocytes Relative: 14 %
Neutro Abs: 5.4 10*3/uL (ref 1.7–7.7)
Neutrophils Relative %: 63 %
Platelets: 298 10*3/uL (ref 150–400)
RBC: 3.34 MIL/uL — ABNORMAL LOW (ref 4.22–5.81)
RDW: 17.1 % — ABNORMAL HIGH (ref 11.5–15.5)
WBC: 8.6 10*3/uL (ref 4.0–10.5)
nRBC: 0 % (ref 0.0–0.2)

## 2023-01-06 LAB — COMPREHENSIVE METABOLIC PANEL
ALT: 14 U/L (ref 0–44)
AST: 36 U/L (ref 15–41)
Albumin: 1.9 g/dL — ABNORMAL LOW (ref 3.5–5.0)
Alkaline Phosphatase: 90 U/L (ref 38–126)
Anion gap: 10 (ref 5–15)
BUN: 33 mg/dL — ABNORMAL HIGH (ref 6–20)
CO2: 21 mmol/L — ABNORMAL LOW (ref 22–32)
Calcium: 7.9 mg/dL — ABNORMAL LOW (ref 8.9–10.3)
Chloride: 103 mmol/L (ref 98–111)
Creatinine, Ser: 1.98 mg/dL — ABNORMAL HIGH (ref 0.61–1.24)
GFR, Estimated: 38 mL/min — ABNORMAL LOW (ref >60)
Glucose, Bld: 164 mg/dL — ABNORMAL HIGH (ref 70–99)
Potassium: 3.7 mmol/L (ref 3.5–5.1)
Sodium: 134 mmol/L — ABNORMAL LOW (ref 135–145)
Total Bilirubin: 1.4 mg/dL — ABNORMAL HIGH (ref 0.3–1.2)
Total Protein: 6.2 g/dL — ABNORMAL LOW (ref 6.5–8.1)

## 2023-01-06 NOTE — Telephone Encounter (Signed)
Medication Refill - Medication: Oxycodone HCl 10 MG TABS [[id number]]   Has the patient contacted their pharmacy? Yes.     (Agent: If yes, when and what did the pharmacy advise?) Contact PCP   Preferred Pharmacy (with phone number or street name):  [id number] [place] [place] - [address]    Has the patient been seen for an appointment in the last year OR does the patient have an upcoming appointment? Yes.    Agent: Please be advised that RX refills may take up to 3 business days. We ask that you follow-up with your pharmacy.   Pt says his refill is due tomorrow and wants to know can he get it a day early since he has a ride today

## 2023-01-06 NOTE — ED Triage Notes (Signed)
Pt. To ED for SOB with swelling of right foot and stomach swelling. Pt. Has cirrhosis, states he thinks he needs more fluid drained. Last paracentesis was last week. Pt. States he was sitting watching TV when SOB came on suddenly. Denies CP, N/V/D, fever.

## 2023-01-06 NOTE — Telephone Encounter (Signed)
[facility] and spoke to [place] about the refill(s) oxycodone requested. Advised it was sent on 01/03/23 #60/0 refill(s). Advised it was sent before that on 12/19/22 #60/0 refills. She says he picked up 20 tablets on 01/03/23 from a prescription written by Dr. Donna Bernard. She says the patient also picked up on 12/19/22 #60 from the Rx sent in by Dr. Althea Charon, so the refill sent by Dr. Althea Charon is due for a refill on 01/07/23 as patient should still have medication on hand from the 01/03/23. Dr. Donna Bernard Rx was given to patient paper copy at discharge from the hospital on 12/30/22. Per below from the patient, he would like to pick up the Rx from the pharmacy today since he has a ride today. Will route this encounter to provider for recommendation.

## 2023-01-06 NOTE — Telephone Encounter (Signed)
Patient no showed appointment this week and Dr Althea Charon is out of the office.  Unable to approve early fill.

## 2023-01-09 ENCOUNTER — Other Ambulatory Visit: Payer: Self-pay

## 2023-01-09 ENCOUNTER — Emergency Department
Admission: EM | Admit: 2023-01-09 | Discharge: 2023-01-09 | Disposition: A | Discharge status: Home / Self Care | Payer: Medicaid Other | Source: Home / Self Care | Attending: Emergency Medicine | Admitting: Emergency Medicine

## 2023-01-09 ENCOUNTER — Emergency Department: Payer: Medicaid Other

## 2023-01-09 ENCOUNTER — Encounter: Payer: Self-pay | Admitting: Emergency Medicine

## 2023-01-09 DIAGNOSIS — J449 Chronic obstructive pulmonary disease, unspecified: Secondary | ICD-10-CM | POA: Insufficient documentation

## 2023-01-09 DIAGNOSIS — R0600 Dyspnea, unspecified: Secondary | ICD-10-CM | POA: Diagnosis not present

## 2023-01-09 DIAGNOSIS — R600 Localized edema: Secondary | ICD-10-CM | POA: Insufficient documentation

## 2023-01-09 DIAGNOSIS — I10 Essential (primary) hypertension: Secondary | ICD-10-CM | POA: Diagnosis not present

## 2023-01-09 DIAGNOSIS — N179 Acute kidney failure, unspecified: Secondary | ICD-10-CM | POA: Diagnosis not present

## 2023-01-09 DIAGNOSIS — R140 Abdominal distension (gaseous): Secondary | ICD-10-CM

## 2023-01-09 DIAGNOSIS — D71 Functional disorders of polymorphonuclear neutrophils: Secondary | ICD-10-CM | POA: Diagnosis not present

## 2023-01-09 DIAGNOSIS — K7031 Alcoholic cirrhosis of liver with ascites: Secondary | ICD-10-CM | POA: Insufficient documentation

## 2023-01-09 DIAGNOSIS — R188 Other ascites: Secondary | ICD-10-CM | POA: Diagnosis not present

## 2023-01-09 LAB — CBC WITH DIFFERENTIAL/PLATELET
Abs Immature Granulocytes: 0.05 10*3/uL (ref 0.00–0.07)
Basophils Absolute: 0.1 10*3/uL (ref 0.0–0.1)
Basophils Relative: 1 %
Eosinophils Absolute: 0.1 10*3/uL (ref 0.0–0.5)
Eosinophils Relative: 1 %
HCT: 28 % — ABNORMAL LOW (ref 39.0–52.0)
Hemoglobin: 9.2 g/dL — ABNORMAL LOW (ref 13.0–17.0)
Immature Granulocytes: 1 %
Lymphocytes Relative: 12 %
Lymphs Abs: 1.3 10*3/uL (ref 0.7–4.0)
MCH: 27.4 pg (ref 26.0–34.0)
MCHC: 32.9 g/dL (ref 30.0–36.0)
MCV: 83.3 fL (ref 80.0–100.0)
Monocytes Absolute: 1.3 10*3/uL — ABNORMAL HIGH (ref 0.1–1.0)
Monocytes Relative: 12 %
Neutro Abs: 7.8 10*3/uL — ABNORMAL HIGH (ref 1.7–7.7)
Neutrophils Relative %: 73 %
Platelets: 315 10*3/uL (ref 150–400)
RBC: 3.36 MIL/uL — ABNORMAL LOW (ref 4.22–5.81)
RDW: 17 % — ABNORMAL HIGH (ref 11.5–15.5)
WBC: 10.5 10*3/uL (ref 4.0–10.5)
nRBC: 0 % (ref 0.0–0.2)

## 2023-01-09 LAB — COMPREHENSIVE METABOLIC PANEL
ALT: 16 U/L (ref 0–44)
AST: 38 U/L (ref 15–41)
Albumin: 2.2 g/dL — ABNORMAL LOW (ref 3.5–5.0)
Alkaline Phosphatase: 81 U/L (ref 38–126)
Anion gap: 10 (ref 5–15)
BUN: 41 mg/dL — ABNORMAL HIGH (ref 6–20)
CO2: 22 mmol/L (ref 22–32)
Calcium: 8.5 mg/dL — ABNORMAL LOW (ref 8.9–10.3)
Chloride: 103 mmol/L (ref 98–111)
Creatinine, Ser: 2.5 mg/dL — ABNORMAL HIGH (ref 0.61–1.24)
GFR, Estimated: 29 mL/min — ABNORMAL LOW (ref >60)
Glucose, Bld: 119 mg/dL — ABNORMAL HIGH (ref 70–99)
Potassium: 4.5 mmol/L (ref 3.5–5.1)
Sodium: 135 mmol/L (ref 135–145)
Total Bilirubin: 1.6 mg/dL — ABNORMAL HIGH (ref 0.3–1.2)
Total Protein: 6.6 g/dL (ref 6.5–8.1)

## 2023-01-09 LAB — BRAIN NATRIURETIC PEPTIDE: B Natriuretic Peptide: 202.1 pg/mL — ABNORMAL HIGH (ref 0.0–100.0)

## 2023-01-09 LAB — LIPASE, BLOOD: Lipase: 27 U/L (ref 11–51)

## 2023-01-09 MED ORDER — ALBUMIN HUMAN 25 % IV SOLN
62.5000 g | Freq: Once | INTRAVENOUS | Status: DC
  Filled 2023-01-09: qty 250

## 2023-01-09 MED ORDER — CALCIUM CARBONATE ANTACID 500 MG PO CHEW
1.0000 | CHEWABLE_TABLET | Freq: Once | ORAL | Status: AC
  Administered 2023-01-09: 200 mg via ORAL
  Filled 2023-01-09: qty 1

## 2023-01-09 MED ORDER — LIDOCAINE HCL (PF) 1 % IJ SOLN
10.0000 mL | Freq: Once | INTRAMUSCULAR | Status: AC
  Administered 2023-01-09: 10 mL via INTRADERMAL

## 2023-01-09 MED ORDER — ACETAMINOPHEN 500 MG PO TABS
1000.0000 mg | ORAL_TABLET | Freq: Once | ORAL | Status: DC
  Filled 2023-01-09: qty 2

## 2023-01-09 MED ORDER — LORAZEPAM 2 MG/ML IJ SOLN: 2.0000 mg | Freq: Once | INTRAMUSCULAR | Status: DC

## 2023-01-09 NOTE — ED Provider Notes (Addendum)
[facility] Provider Note    Event Date/Time   First MD Initiated Contact with Patient 01/09/23 [phone]     (approximate)   History   Leg Swelling   HPI  Tom [name] is a 57 y.o. male   Past medical history of hypertension, hyperlipidemia, end-stage liver disease, alcoholic cirrhosis of liver, COPD, GERD, who presents with leg swelling and abdominal distention w request that he needs a therapeutic paracentesis.  He says he gained 6 pounds in the last several days.  He says that he has been getting therapeutic paracentesis every 1 to 2 weeks and getting a significant amount of fluid off.  He feels that it is time to get some fluid off because if he waits longer than this he starts to have breathing problems.  He denies any pain in his abdomen or fevers.  He denies any other acute medical complaints.   External Medical Documents Reviewed: Discharge summary from 12/30/2022 when he presented with leg swelling and pain in the right lower leg, seen by podiatry, given IV antibiotics for cellulitis, discharged on p.o. antibiotic and noted at that time to have cirrhosis with ascites, declining a paracentesis at the time, noted to have been referred to IR for TIPS consideration, noted also to be on lactulose and Lasix and spironolactone, ultimately did get a ultrasound-guided paracentesis with 7 L fluid removal on 7/11      Physical Exam   Triage Vital Signs: ED Triage Vitals  Encounter Vitals Group     BP 01/09/23 0904 136/77     Systolic BP Percentile --      Diastolic BP Percentile --      Pulse Rate 01/09/23 0904 99     Resp 01/09/23 0904 18     Temp 01/09/23 0904 98.1 F (36.7 C)     Temp Source 01/09/23 0904 Oral     SpO2 01/09/23 0904 98 %     Weight 01/09/23 0905 247 lb (112 kg)     Height 01/09/23 0905 6' (1.829 m)     Head Circumference --      Peak Flow --      Pain Score 01/09/23 0905 0     Pain Loc --      Pain Education --       Exclude from Growth Chart --     Most recent vital signs: Vitals:   01/09/23 0904 01/09/23 1045  BP: 136/77   Pulse: 99 95  Resp: 18 10  Temp: 98.1 F (36.7 C)   SpO2: 98% 99%    General: Awake, no distress.  CV:  Good peripheral perfusion.  Resp:  Normal effort.  Abd:  No distention.  Other:  Awake alert pleasant with normal hemodynamics normal oxygen saturations breathing comfortably pleasant gentleman.  Abdominal distention that is soft nontender and he is afebrile.  He has some bilateral lower extremity edema as well.   ED Results / Procedures / Treatments   Labs (all labs ordered are listed, but only abnormal results are displayed) Labs Reviewed  CBC WITH DIFFERENTIAL/PLATELET - Abnormal; Notable for the following components:      Result Value   RBC 3.36 (*)    Hemoglobin 9.2 (*)    HCT 28.0 (*)    RDW 17.0 (*)    Neutro Abs 7.8 (*)    Monocytes Absolute 1.3 (*)    All other components within normal limits  COMPREHENSIVE METABOLIC PANEL - Abnormal; Notable for the following  components:   Glucose, Bld 119 (*)    BUN 41 (*)    Creatinine, Ser 2.50 (*)    Calcium 8.5 (*)    Albumin 2.2 (*)    Total Bilirubin 1.6 (*)    GFR, Estimated 29 (*)    All other components within normal limits  BRAIN NATRIURETIC PEPTIDE - Abnormal; Notable for the following components:   B Natriuretic Peptide 202.1 (*)    All other components within normal limits  LIPASE, BLOOD     I ordered and reviewed the above labs they are notable for H&H is at baseline and platelets in the 300s.  Cr is elevated at 2.5 from 2   RADIOLOGY I independently reviewed and interpreted chest x-ray and see no obvious focalities or pneumothorax I also reviewed radiologist's formal read.   PROCEDURES:  Critical Care performed: No  Procedures   MEDICATIONS ORDERED IN ED: Medications  calcium carbonate (TUMS - dosed in mg elemental calcium) chewable tablet 200 mg of elemental calcium (200 mg of  elemental calcium Oral Given 01/09/23 0957)    External physician / consultants:  I spoke with interventional radiologist regarding care plan for this patient.   IMPRESSION / MDM / ASSESSMENT AND PLAN / ED COURSE  I reviewed the triage vital signs and the nursing notes.                                Patient's presentation is most consistent with acute presentation with potential threat to life or bodily function.  Differential diagnosis includes, but is not limited to, abdominal distention due to cirrhotic liver so ascites, SBP, chf exacerbation   The patient is on the cardiac monitor to evaluate for evidence of arrhythmia and/or significant heart rate changes.  MDM: Patient is 1 to 2 weeks out from his last paracentesis which took off 7 L, has been getting therapeutic paracentesis with large volume taken off every approximately 1 to 2 weeks and due for another 1.  He is feeling abdominal distention but has no pain shortness of breath or fever.  I doubt SBP or CHF exacerbation.  Breathing comfortably. I have contacted IR for nonemergent procedure -I think either today if schedule permits, or tomorrow would be okay.  Patient prefers today obviously, but okay for discharge and follow-up tomorrow with IR procedure can be arranged at that time, if cannot be done today patient feels well and would prefer to be discharged from hospital with scheduled appointment tomorrow.        FINAL CLINICAL IMPRESSION(S) / ED DIAGNOSES   Final diagnoses:  Alcoholic cirrhosis of liver with ascites (HCC)  Abdominal distension  AKI (acute kidney injury) (HCC)     Rx / DC Orders   ED Discharge Orders     None        Note:  This document was prepared using Dragon voice recognition software and may include unintentional dictation errors.    Pilar Jarvis, MD 01/09/23 1123    Pilar Jarvis, MD 01/09/23 [phone]

## 2023-01-09 NOTE — ED Provider Notes (Signed)
56 year old male with history of end-stage liver disease who presented with abdominal distention requesting therapeutic paracentesis.  Please see initial physician note for further details.  Case had been discussed with IR and plan had been for paracentesis with plan for discharge following this.  Around 5 PM, I was notified by the ED RN that patient had been brought back to the ER with the provider noting that he required albumin following his paracentesis prior to being discharged.  I did review the patient's chart.  He had 9.6 L pulled off, is appropriate for albumin.  I did go to the evaluate the patient and discussed this with him.  He reported that he had a family emergency, and he was not able to stay.  He understands that he is at high risk for significant fluid shifts and blood pressure fluctuations that can be life-threatening.  He initially requested to be discharged AGAINST MEDICAL ADVICE.  He did demonstrate decision-making capacity.  He briefly reported that he was willing to stay and albumin was ordered, but I was later informed by the patient's RN that he did ultimately leave AMA.   The patient wants leave against medical advise. I spoke with the patient extensively regarding the risks of leaving prior to completion of an appropriate workup and interventions given their symptomatology. We discussed possibility of significant morbidity and death that may occur as a result of them leaving. The patient expressed understanding of the situation and items mentioned, however would like to leave our care.   I have determined that the patient has the capacity for this medical decision. I have discussed the risks and benefits of the proposed treatment and treatment alternatives including non-treatment. I have offered an open invitation to the patient to return at any time for care. I have determined that the patient understands this discussion and that the patient willingly assumes the potential risk to  their well-being as a result of this informed refusal.   Trinna Post, MD 01/09/23 Rickey Primus

## 2023-01-09 NOTE — ED Triage Notes (Signed)
Pt with hx of cirrhosis has gained 6 lbs over last few days and he feels like he needs a paracentesis.

## 2023-01-09 NOTE — ED Notes (Signed)
Patient requested to go to the restroom. Was unhooked from monitoring and assisted to in-room toilet. Patient was hooked back to monitoring upon return to the bed.

## 2023-01-09 NOTE — Discharge Instructions (Addendum)
Please follow-up with your doctor for recheck of your symptoms and inquire about TIPS procedure.  Your kidney numbers were elevated today, please have your doctor recheck these levels within the next 1 to 2 weeks.  Thank you for choosing [place] for your health care today!  Please see your primary doctor this week for a follow up appointment.   If you have any new, worsening, or unexpected symptoms call your doctor right away or come back to the emergency department for reevaluation.  It was my pleasure to care for you today.   Daneil Dan [place] Charon, MD

## 2023-01-09 NOTE — ED Notes (Signed)
Patient given sandwich tray and ginger ale as requested.

## 2023-01-09 NOTE — ED Notes (Signed)
Patient refusing to stay any longer. Patient encouraged to stay but unwilling at this time. Patient verbalized understand of leaving AMA. Paper copy of AMA signed by patient and witnessed by this RN. Patient ambulatory with steady gait and walker out of ED. Ray, MD made aware.

## 2023-01-10 ENCOUNTER — Telehealth: Payer: Self-pay

## 2023-01-10 ENCOUNTER — Telehealth: Payer: Self-pay | Admitting: Family Medicine

## 2023-01-10 DIAGNOSIS — K7031 Alcoholic cirrhosis of liver with ascites: Secondary | ICD-10-CM

## 2023-01-10 NOTE — Telephone Encounter (Signed)
Left message for Tom [name] approving physical therapy.  Order also faxed.

## 2023-01-10 NOTE — Telephone Encounter (Signed)
Tom [name] called from [facility] due to the referral hat was sent on 7/22 for paracentesis weekly. Tom [name] has a few things that she needs clarification on. She wants to know if its a stand order and if so she will need another order, if there is a max liter to be drawn, if albumin needs to be administered after the draw and how much per liter drawn and also if the fluid they draw needs to be sent for labs. She also stated they do not do physical therapy for the diagnosis that was given. Please f/u with Tom [name] for clarification and addtnl orders.

## 2023-01-10 NOTE — Telephone Encounter (Signed)
I will need to defer this to PCP.  I have never ordered a paracentesis or written for how many liters to be drawn off.

## 2023-01-10 NOTE — Telephone Encounter (Signed)
Copied from CRM [id number]. Topic: General - Other >> Jan 10, 2023  8:51 AM Dondra Prader A wrote: Reason for CRM: Pt is calling in requesting an order for Physical Therapy. 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 Pt would like to have Janette do his Physical Therapy.  Janette Phone number is: [phone] Fax number:  [phone]  Please advise.

## 2023-01-10 NOTE — Telephone Encounter (Signed)
Okay for PT as requested ?

## 2023-01-11 ENCOUNTER — Telehealth: Payer: Self-pay | Admitting: *Deleted

## 2023-01-11 NOTE — Transitions of Care (Post Inpatient/ED Visit) (Signed)
   01/11/2023  Name: Tom [name] MRN: [id number] DOB: 1965-09-19  Today's TOC FU Call Status: Today's TOC FU Call Status:: Successful TOC FU Call Competed TOC FU Call Complete Date: 01/11/23  Transition Care Management Follow-up Telephone Call Date of Discharge: 01/09/23 (left AMA) Discharge Facility: [facility]) Type of Discharge: Emergency Department Reason for ED Visit: Other: (abdominal distention) How have you been since you were released from the hospital?: Better Any questions or concerns?: No  Items Reviewed: Did you receive and understand the discharge instructions provided?: No (patient left AMA) Medications obtained,verified, and reconciled?: No Medications Not Reviewed Reasons:: Other: (Patient unable to review medications with RNCM. Advised patient to take all medications to PCP on 01/16/23) Any new allergies since your discharge?: No Dietary orders reviewed?: NA Do you have support at home?: Yes People in Home: significant other Name of Support/Comfort Primary Source: [name]  Medications Reviewed Today: Patient unable to review medications with RNCM Medications Reviewed Today   Medications were not reviewed in this encounter     Home Care and Equipment/Supplies: Were Home Health Services Ordered?: NA Any new equipment or medical supplies ordered?: NA  Functional Questionnaire: Do you need assistance with bathing/showering or dressing?: No Do you need assistance with meal preparation?: No Do you need assistance with eating?: No Do you have difficulty maintaining continence: No Do you need assistance with getting out of bed/getting out of a chair/moving?: No Do you have difficulty managing or taking your medications?: No  Follow up appointments reviewed: PCP Follow-up appointment confirmed?: Yes Date of PCP follow-up appointment?: 01/16/23 Follow-up Provider: Dr. Malachi Paradise Do you need transportation to your follow-up  appointment?: No Do you understand care options if your condition(s) worsen?: Yes-patient verbalized understanding  SDOH Interventions Today    Flowsheet Row Most Recent Value  SDOH Interventions   Transportation Interventions Intervention Not Indicated       [name] RN, BSN [place]  Managed [facility] RN Care Coordinator [phone]

## 2023-01-11 NOTE — Telephone Encounter (Signed)
I have reviewed this patient's chart, specifically his [facility] record last office visit in May 2024.  I have not ordered Paracentesis and it is not something I am comfortable managing alone.  It should be managed and followed by his Liver specialist, [facility].  He was seen by Dr Ruffin Frederick in May 2024 at [facility] and they recommended a MONTHLY Paracentesis at [facility] through Interventional Radiology.  Janyth Pupa, MD  [address]  [address] [place], [place] [address]  Phone: [phone]  Fax: [phone]   Can you please contact [facility] and ask if their team can schedule patient and place orders?  Thank you  Saralyn Pilar, DO [facility] 01/11/2023, 1:12 PM

## 2023-01-12 ENCOUNTER — Ambulatory Visit: Payer: Self-pay | Admitting: *Deleted

## 2023-01-12 NOTE — Telephone Encounter (Signed)
  Chief Complaint: wound leakage- lower R Symptoms: pin sized hole continues to leak clear fluid after paracentesis - clear, no pain, no redness, no fever Frequency: 01/09/23- mostly at night leaks and wakes wet Pertinent Negatives: Patient denies pain, redness, fever Disposition: [] ED /[] Urgent Care (no appt availability in office) / [] Appointment(In office/virtual)/ []  [place] [facility]/ [] Home Care/ [x] Refused Recommended Disposition /[]  Mobile Bus/ []  Follow-up with PCP Additional Notes: Patient would like wound checked he states he has paracentesis done every 1-2 weeks and he normally has leakage- but not this much. Patient advised ED would be best place and he knows this- but he does not want to go- he states he goes there so much and the wait is really long. Patient is asking if PCP can check wound. There is no appointment- advised patient I would send message to PCP- but his schedule is full and he will have to decide if he can see patient in the office for this. I do not think patient will go to ED if advised.   Reason for Disposition . [1] Clear or blood-tinged fluid draining from wound AND [2] no fever  Answer Assessment - Initial Assessment Questions 1. SYMPTOM: "What's the main symptom you're concerned about?" (e.g., drainage, incision opening up, pain, redness)     Drainage from incision site 2. ONSET: "When did drainage  start?"     Every time patient has this procedure- he has leaking 3. SURGERY: "What surgery did you have?"     Less than 1 week- due to go back in a week 4. DATE of SURGERY: "When was the surgery?"      01/09/23 5. INCISION SITE: "Where is the incision located?"       R abdomin- pin size hole with drainage 6. REDNESS: "Is there any redness at the incision site?" If Yes, ask: "How wide across is the redness?" (Inches, centimeters)      no 7. PAIN: "Is there any pain?" If Yes, ask: "How bad is it?"  (Scale 1-10; or mild, moderate, severe)   - NONE  (0): no pain   - MILD (1-3): doesn't interfere with normal activities    - MODERATE (4-7): interferes with normal activities or awakens from sleep    - SEVERE (8-10): excruciating pain, unable to do any normal activities     no 8. BLEEDING: "Is there any bleeding?" If Yes, ask: "How much?" and "Where?"     no 9. DRAINAGE: "Is there any drainage from the incision site?" If Yes, ask: "What color and how much?" (e.g., red, cloudy, pus; drops, teaspoon)     clear 10. FEVER: "Do you have a fever?" If Yes, ask: "What is your temperature, how was it measured, and when did it start?"       no 11. OTHER SYMPTOMS: "Do you have any other symptoms?" (e.g., dizziness, rash elsewhere on body, shaking chills, weakness)       no  Protocols used: Post-Op Incision Symptoms and Questions-A-AH

## 2023-01-13 ENCOUNTER — Ambulatory Visit: Payer: Self-pay

## 2023-01-13 ENCOUNTER — Encounter: Payer: Self-pay | Admitting: Family Medicine

## 2023-01-13 NOTE — Telephone Encounter (Signed)
This is not something that I can do.  Saralyn Pilar, DO [facility] 01/13/2023, 10:24 AM

## 2023-01-13 NOTE — Telephone Encounter (Signed)
  Chief Complaint: Leaking fluid from paracentesis site Symptoms: Fluid leaking Frequency: Since 01/09/2023 Pertinent Negatives: Patient denies any other s/s Disposition: [] ED /[] Urgent Care (no appt availability in office) / [] Appointment(In office/virtual)/ []  [place] [facility]/ [] Home Care/ [] Refused Recommended Disposition /[] [place] Mobile Bus/ [x]  Follow-up with PCP Additional Notes: Pt was seen at ED on 01/09/2023 for paracentesis. Pt states that fluid continues to leak from site. Pt states that he is soaking through a washcloth at night.  Pt will not return to ED for care. Pt would like to be seen in the office. He would like provider to glue the hole shut. Pt states that otherwise he feels great. Please advise pt.      Reason for Disposition  Nursing judgment  Protocols used: No Guideline or Reference Available-A-AH

## 2023-01-13 NOTE — Telephone Encounter (Signed)
Left message advising patient to go to hospital for concern.

## 2023-01-16 ENCOUNTER — Inpatient Hospital Stay: Payer: Medicaid Other | Admitting: Family Medicine

## 2023-01-16 ENCOUNTER — Observation Stay: Payer: Medicaid Other

## 2023-01-16 ENCOUNTER — Other Ambulatory Visit: Payer: Self-pay

## 2023-01-16 ENCOUNTER — Observation Stay
Admission: EM | Admit: 2023-01-16 | Discharge: 2023-01-17 | Disposition: A | Discharge status: Home / Self Care | Payer: Medicaid Other | Attending: Internal Medicine | Admitting: Internal Medicine

## 2023-01-16 DIAGNOSIS — I10 Essential (primary) hypertension: Secondary | ICD-10-CM | POA: Diagnosis not present

## 2023-01-16 DIAGNOSIS — Z79899 Other long term (current) drug therapy: Secondary | ICD-10-CM | POA: Insufficient documentation

## 2023-01-16 DIAGNOSIS — J449 Chronic obstructive pulmonary disease, unspecified: Secondary | ICD-10-CM | POA: Diagnosis not present

## 2023-01-16 DIAGNOSIS — N1831 Chronic kidney disease, stage 3a: Secondary | ICD-10-CM | POA: Diagnosis not present

## 2023-01-16 DIAGNOSIS — F419 Anxiety disorder, unspecified: Secondary | ICD-10-CM | POA: Diagnosis not present

## 2023-01-16 DIAGNOSIS — J45909 Unspecified asthma, uncomplicated: Secondary | ICD-10-CM | POA: Insufficient documentation

## 2023-01-16 DIAGNOSIS — K7031 Alcoholic cirrhosis of liver with ascites: Secondary | ICD-10-CM | POA: Diagnosis not present

## 2023-01-16 DIAGNOSIS — R1013 Epigastric pain: Secondary | ICD-10-CM | POA: Diagnosis not present

## 2023-01-16 DIAGNOSIS — Z87891 Personal history of nicotine dependence: Secondary | ICD-10-CM | POA: Insufficient documentation

## 2023-01-16 DIAGNOSIS — E669 Obesity, unspecified: Secondary | ICD-10-CM | POA: Diagnosis not present

## 2023-01-16 DIAGNOSIS — R109 Unspecified abdominal pain: Secondary | ICD-10-CM | POA: Diagnosis present

## 2023-01-16 DIAGNOSIS — R188 Other ascites: Secondary | ICD-10-CM | POA: Diagnosis not present

## 2023-01-16 DIAGNOSIS — R110 Nausea: Secondary | ICD-10-CM | POA: Diagnosis not present

## 2023-01-16 DIAGNOSIS — Z96651 Presence of right artificial knee joint: Secondary | ICD-10-CM | POA: Insufficient documentation

## 2023-01-16 DIAGNOSIS — I129 Hypertensive chronic kidney disease with stage 1 through stage 4 chronic kidney disease, or unspecified chronic kidney disease: Secondary | ICD-10-CM | POA: Diagnosis not present

## 2023-01-16 DIAGNOSIS — R1900 Intra-abdominal and pelvic swelling, mass and lump, unspecified site: Secondary | ICD-10-CM | POA: Diagnosis not present

## 2023-01-16 DIAGNOSIS — J4489 Other specified chronic obstructive pulmonary disease: Secondary | ICD-10-CM

## 2023-01-16 DIAGNOSIS — K219 Gastro-esophageal reflux disease without esophagitis: Secondary | ICD-10-CM | POA: Diagnosis not present

## 2023-01-16 DIAGNOSIS — Z6830 Body mass index (BMI) 30.0-30.9, adult: Secondary | ICD-10-CM | POA: Insufficient documentation

## 2023-01-16 LAB — BODY FLUID CELL COUNT WITH DIFFERENTIAL
Eos, Fluid: 0 %
Eos, Fluid: 0 %
Lymphs, Fluid: 31 %
Lymphs, Fluid: 43 %
Monocyte-Macrophage-Serous Fluid: 62 %
Monocyte-Macrophage-Serous Fluid: 56 %
Neutrophil Count, Fluid: 7 %
Neutrophil Count, Fluid: 1 %
Total Nucleated Cell Count, Fluid: 48 cu mm
Total Nucleated Cell Count, Fluid: 48 cu mm

## 2023-01-16 LAB — COMPREHENSIVE METABOLIC PANEL
ALT: 23 U/L (ref 0–44)
AST: 47 U/L — ABNORMAL HIGH (ref 15–41)
Albumin: 2.1 g/dL — ABNORMAL LOW (ref 3.5–5.0)
Alkaline Phosphatase: 77 U/L (ref 38–126)
Anion gap: 9 (ref 5–15)
BUN: 33 mg/dL — ABNORMAL HIGH (ref 6–20)
CO2: 22 mmol/L (ref 22–32)
Calcium: 8.5 mg/dL — ABNORMAL LOW (ref 8.9–10.3)
Chloride: 107 mmol/L (ref 98–111)
Creatinine, Ser: 1.53 mg/dL — ABNORMAL HIGH (ref 0.61–1.24)
GFR, Estimated: 52 mL/min — ABNORMAL LOW (ref >60)
Glucose, Bld: 109 mg/dL — ABNORMAL HIGH (ref 70–99)
Potassium: 4 mmol/L (ref 3.5–5.1)
Sodium: 138 mmol/L (ref 135–145)
Total Bilirubin: 1.7 mg/dL — ABNORMAL HIGH (ref 0.3–1.2)
Total Protein: 6.3 g/dL — ABNORMAL LOW (ref 6.5–8.1)

## 2023-01-16 LAB — CBC WITH DIFFERENTIAL/PLATELET
Abs Immature Granulocytes: 0.1 10*3/uL — ABNORMAL HIGH (ref 0.00–0.07)
Basophils Absolute: 0.1 10*3/uL (ref 0.0–0.1)
Basophils Relative: 0 %
Eosinophils Absolute: 0.7 10*3/uL — ABNORMAL HIGH (ref 0.0–0.5)
Eosinophils Relative: 4 %
HCT: 31.3 % — ABNORMAL LOW (ref 39.0–52.0)
Hemoglobin: 10.5 g/dL — ABNORMAL LOW (ref 13.0–17.0)
Immature Granulocytes: 1 %
Lymphocytes Relative: 11 %
Lymphs Abs: 1.7 10*3/uL (ref 0.7–4.0)
MCH: 28.3 pg (ref 26.0–34.0)
MCHC: 33.5 g/dL (ref 30.0–36.0)
MCV: 84.4 fL (ref 80.0–100.0)
Monocytes Absolute: 1.7 10*3/uL — ABNORMAL HIGH (ref 0.1–1.0)
Monocytes Relative: 11 %
Neutro Abs: 11.6 10*3/uL — ABNORMAL HIGH (ref 1.7–7.7)
Neutrophils Relative %: 73 %
Platelets: 262 10*3/uL (ref 150–400)
RBC: 3.71 MIL/uL — ABNORMAL LOW (ref 4.22–5.81)
RDW: 17.8 % — ABNORMAL HIGH (ref 11.5–15.5)
WBC: 15.8 10*3/uL — ABNORMAL HIGH (ref 4.0–10.5)
nRBC: 0 % (ref 0.0–0.2)

## 2023-01-16 LAB — PATHOLOGIST SMEAR REVIEW

## 2023-01-16 LAB — PROTIME-INR
INR: 1.3 — ABNORMAL HIGH (ref 0.8–1.2)
Prothrombin Time: 16.8 seconds — ABNORMAL HIGH (ref 11.4–15.2)

## 2023-01-16 LAB — PROTEIN, PLEURAL OR PERITONEAL FLUID: Total protein, fluid: <3 g/dL

## 2023-01-16 LAB — APTT: aPTT: 33 seconds (ref 24–36)

## 2023-01-16 LAB — ALBUMIN, PLEURAL OR PERITONEAL FLUID: Albumin, Fluid: <1.5 g/dL

## 2023-01-16 LAB — BODY FLUID CULTURE W GRAM STAIN
Gram Stain: NONE SEEN
Gram Stain: NONE SEEN

## 2023-01-16 LAB — LACTATE DEHYDROGENASE, PLEURAL OR PERITONEAL FLUID: LD, Fluid: 64 U/L — ABNORMAL HIGH (ref 3–23)

## 2023-01-16 LAB — LACTATE DEHYDROGENASE: LDH: 224 U/L — ABNORMAL HIGH (ref 98–192)

## 2023-01-16 LAB — AMMONIA: Ammonia: 23 umol/L (ref 9–35)

## 2023-01-16 MED ORDER — MORPHINE SULFATE (PF) 2 MG/ML IV SOLN
2.0000 mg | INTRAVENOUS | Status: DC | PRN
  Administered 2023-01-16 (×3): 2 mg via INTRAVENOUS
  Filled 2023-01-16 (×3): qty 1

## 2023-01-16 MED ORDER — OXYCODONE HCL 5 MG PO TABS
10.0000 mg | ORAL_TABLET | Freq: Four times a day (QID) | ORAL | Status: DC | PRN
  Administered 2023-01-16 – 2023-01-17 (×4): 10 mg via ORAL
  Filled 2023-01-16 (×4): qty 2

## 2023-01-16 MED ORDER — FOLIC ACID 1 MG PO TABS
1.0000 mg | ORAL_TABLET | Freq: Every day | ORAL | Status: DC
  Administered 2023-01-16 – 2023-01-17 (×2): 1 mg via ORAL
  Filled 2023-01-16 (×2): qty 1

## 2023-01-16 MED ORDER — FENTANYL CITRATE PF 50 MCG/ML IJ SOSY
50.0000 ug | PREFILLED_SYRINGE | Freq: Once | INTRAMUSCULAR | Status: AC
  Administered 2023-01-16: 50 ug via INTRAVENOUS
  Filled 2023-01-16: qty 1

## 2023-01-16 MED ORDER — FUROSEMIDE 40 MG PO TABS
40.0000 mg | ORAL_TABLET | Freq: Every day | ORAL | Status: DC
  Administered 2023-01-16 – 2023-01-17 (×2): 40 mg via ORAL
  Filled 2023-01-16 (×2): qty 1

## 2023-01-16 MED ORDER — HYDRALAZINE HCL 20 MG/ML IJ SOLN: 5.0000 mg | INTRAMUSCULAR | Status: DC | PRN

## 2023-01-16 MED ORDER — THIAMINE MONONITRATE 100 MG PO TABS
100.0000 mg | ORAL_TABLET | Freq: Every day | ORAL | Status: DC
  Administered 2023-01-16 – 2023-01-17 (×2): 100 mg via ORAL
  Filled 2023-01-16 (×2): qty 1

## 2023-01-16 MED ORDER — ONDANSETRON HCL 4 MG/2ML IJ SOLN: 4.0000 mg | Freq: Three times a day (TID) | INTRAMUSCULAR | Status: DC | PRN

## 2023-01-16 MED ORDER — HYDROXYZINE HCL 25 MG PO TABS: 25.0000 mg | ORAL_TABLET | Freq: Three times a day (TID) | ORAL | Status: DC | PRN

## 2023-01-16 MED ORDER — FAMOTIDINE 20 MG PO TABS
20.0000 mg | ORAL_TABLET | Freq: Every day | ORAL | Status: DC
  Administered 2023-01-16: 20 mg via ORAL
  Filled 2023-01-16: qty 1

## 2023-01-16 MED ORDER — ALBUMIN HUMAN 25 % IV SOLN
25.0000 g | Freq: Once | INTRAVENOUS | Status: AC
  Administered 2023-01-16: 25 g via INTRAVENOUS
  Filled 2023-01-16: qty 100

## 2023-01-16 MED ORDER — PANTOPRAZOLE SODIUM 40 MG PO TBEC
40.0000 mg | DELAYED_RELEASE_TABLET | Freq: Every day | ORAL | Status: DC
  Administered 2023-01-17: 40 mg via ORAL
  Filled 2023-01-16: qty 1

## 2023-01-16 MED ORDER — LIDOCAINE HCL (PF) 1 % IJ SOLN
10.0000 mL | Freq: Once | INTRAMUSCULAR | Status: AC
  Administered 2023-01-16: 10 mL via INTRADERMAL

## 2023-01-16 MED ORDER — LIDOCAINE HCL (PF) 1 % IJ SOLN
5.0000 mL | Freq: Once | INTRAMUSCULAR | Status: AC
  Administered 2023-01-16: 5 mL
  Filled 2023-01-16: qty 5

## 2023-01-16 MED ORDER — THIAMINE HCL 100 MG PO TABS
100.0000 mg | ORAL_TABLET | Freq: Every day | ORAL | Status: DC
  Filled 2023-01-16: qty 1

## 2023-01-16 MED ORDER — ACETAMINOPHEN 325 MG PO TABS: 325.0000 mg | ORAL_TABLET | ORAL | Status: DC | PRN

## 2023-01-16 MED ORDER — SODIUM CHLORIDE 0.9 % IV SOLN
2.0000 g | INTRAVENOUS | Status: DC
  Administered 2023-01-16: 2 g via INTRAVENOUS
  Filled 2023-01-16 (×2): qty 20

## 2023-01-16 MED ORDER — DM-GUAIFENESIN ER 30-600 MG PO TB12: 1.0000 | ORAL_TABLET | Freq: Two times a day (BID) | ORAL | Status: DC | PRN

## 2023-01-16 MED ORDER — ADULT MULTIVITAMIN W/MINERALS CH
1.0000 | ORAL_TABLET | Freq: Every day | ORAL | Status: DC
  Administered 2023-01-16 – 2023-01-17 (×2): 1 via ORAL
  Filled 2023-01-16 (×2): qty 1

## 2023-01-16 MED ORDER — ALBUTEROL SULFATE (2.5 MG/3ML) 0.083% IN NEBU: 3.0000 mL | INHALATION_SOLUTION | RESPIRATORY_TRACT | Status: DC | PRN

## 2023-01-16 MED ORDER — OXYCODONE HCL 5 MG PO TABS
5.0000 mg | ORAL_TABLET | Freq: Once | ORAL | Status: AC
  Administered 2023-01-16: 5 mg via ORAL
  Filled 2023-01-16: qty 1

## 2023-01-16 MED ORDER — SPIRONOLACTONE 25 MG PO TABS
25.0000 mg | ORAL_TABLET | Freq: Every day | ORAL | Status: DC
  Administered 2023-01-16 – 2023-01-17 (×2): 25 mg via ORAL
  Filled 2023-01-16 (×2): qty 1

## 2023-01-16 MED ORDER — MAGNESIUM OXIDE -MG SUPPLEMENT 400 (240 MG) MG PO TABS
800.0000 mg | ORAL_TABLET | Freq: Two times a day (BID) | ORAL | Status: DC
  Administered 2023-01-16 – 2023-01-17 (×3): 800 mg via ORAL
  Filled 2023-01-16 (×3): qty 2

## 2023-01-16 NOTE — ED Notes (Signed)
ED TO INPATIENT HANDOFF REPORT  ED Nurse Name and Phone #: Delice Bison, RN  S Name/Age/Gender Tom [name] 57 y.o. male Room/Bed: ED09A/ED09A  Code Status   Code Status: Full Code  Home/SNF/Other Home Patient oriented to: self, place, time, and situation Is this baseline? Yes   Triage Complete: Triage complete  Chief Complaint Abdominal pain [R10.9]  Triage Note Pt c/o abdominal pain from his ascites. Has it drained weekly but pt is unsure when his next appointment is. States he can't drive well anymore so difficult to get to his appointments.   Allergies Allergies  Allergen Reactions   Alfuzosin Other (See Comments)    Dizziness    Amlodipine Nausea And Vomiting   Bee Venom Swelling   Cymbalta [Duloxetine Hcl] Other (See Comments)    Emesis    Duloxetine Other (See Comments)    Emesis   Lisinopril    Flomax [Tamsulosin Hcl] Itching    Level of Care/Admitting Diagnosis ED Disposition     ED Disposition  Admit   Condition  --   Comment  Hospital [facility]: [facility] [[id number]]  Level of Care: Med-Surg [16]  Covid Evaluation: Asymptomatic - no recent exposure (last 10 days) testing not required  Diagnosis: Abdominal pain [657846]  Admitting Physician: Lorretta Harp [[id number]]  Attending Physician: Lorretta Harp [[id number]]          B Medical/Surgery History Past Medical History:  Diagnosis Date   Arthritis    Asthma    COPD (chronic obstructive pulmonary disease) (HCC)    Hyperlipidemia    Hypertension    Hypokalemia 12/04/2022   Hypomagnesemia 12/04/2022   Hypomagnesemia 12/04/2022   Osteoporosis    Past Surgical History:  Procedure Laterality Date   ANKLE FUSION Right 2017   BACK SURGERY     TOTAL KNEE ARTHROPLASTY Right 04/2012     A IV Location/Drains/Wounds Patient Lines/Drains/Airways Status     Active Line/Drains/Airways     Name Placement date Placement time Site Days   Peripheral IV 01/16/23 20 G 1" Right Antecubital  01/16/23  0729  Antecubital  less than 1   Wound / Incision (Open or Dehisced) 12/04/22  Heel Right Open 12/04/22  1630  Heel  43            Intake/Output Last 24 hours No intake or output data in the 24 hours ending 01/16/23 1044  Labs/Imaging Results for orders placed or performed during the hospital encounter of 01/16/23 (from the past 48 hour(s))  CBC with Differential     Status: Abnormal   Collection Time: 01/16/23  7:29 AM  Result Value Ref Range   WBC 15.8 (H) 4.0 - 10.5 K/uL   RBC 3.71 (L) 4.22 - 5.81 MIL/uL   Hemoglobin 10.5 (L) 13.0 - 17.0 g/dL   HCT 96.2 (L) 95.2 - 84.1 %   MCV 84.4 80.0 - 100.0 fL   MCH 28.3 26.0 - 34.0 pg   MCHC 33.5 30.0 - 36.0 g/dL   RDW 32.4 (H) 40.1 - 02.7 %   Platelets 262 150 - 400 K/uL   nRBC 0.0 0.0 - 0.2 %   Neutrophils Relative % 73 %   Neutro Abs 11.6 (H) 1.7 - 7.7 K/uL   Lymphocytes Relative 11 %   Lymphs Abs 1.7 0.7 - 4.0 K/uL   Monocytes Relative 11 %   Monocytes Absolute 1.7 (H) 0.1 - 1.0 K/uL   Eosinophils Relative 4 %   Eosinophils Absolute 0.7 (H) 0.0 - 0.5  K/uL   Basophils Relative 0 %   Basophils Absolute 0.1 0.0 - 0.1 K/uL   Immature Granulocytes 1 %   Abs Immature Granulocytes 0.10 (H) 0.00 - 0.07 K/uL    Comment: Performed at [facility], [address] [place], [place] [address]  Comprehensive metabolic panel     Status: Abnormal   Collection Time: 01/16/23  7:29 AM  Result Value Ref Range   Sodium 138 135 - 145 mmol/L   Potassium 4.0 3.5 - 5.1 mmol/L   Chloride 107 98 - 111 mmol/L   CO2 22 22 - 32 mmol/L   Glucose, Bld 109 (H) 70 - 99 mg/dL    Comment: Glucose reference range applies only to samples taken after fasting for at least 8 hours.   BUN 33 (H) 6 - 20 mg/dL   Creatinine, Ser 4.69 (H) 0.61 - 1.24 mg/dL   Calcium 8.5 (L) 8.9 - 10.3 mg/dL   Total Protein 6.3 (L) 6.5 - 8.1 g/dL   Albumin 2.1 (L) 3.5 - 5.0 g/dL   AST 47 (H) 15 - 41 U/L   ALT 23 0 - 44 U/L   Alkaline Phosphatase 77 38 - 126  U/L   Total Bilirubin 1.7 (H) 0.3 - 1.2 mg/dL   GFR, Estimated 52 (L) >60 mL/min    Comment: (NOTE) Calculated using the CKD-EPI Creatinine Equation (2021)    Anion gap 9 5 - 15    Comment: Performed at [facility], [address] [place], [place] [address]  Body fluid cell count with differential     Status: None   Collection Time: 01/16/23  8:14 AM  Result Value Ref Range   Fluid Type-FCT PERITONEAL     Comment: CORRECTED ON 07/29 AT 8413: PREVIOUSLY REPORTED AS Peritoneal   Color, Fluid YELLOW YELLOW   Appearance, Fluid CLEAR CLEAR   Total Nucleated Cell Count, Fluid 48 cu mm   Neutrophil Count, Fluid 7 %   Lymphs, Fluid 31 %   Monocyte-Macrophage-Serous Fluid 62 %   Eos, Fluid 0 %    Comment: Performed at [facility], [address] [place], [place] [address]   No results found.  Pending Labs Unresulted Labs (From admission, onward)     Start     Ordered   01/17/23 0500  Comprehensive metabolic panel  Tomorrow morning,   R        01/16/23 1036   01/17/23 0500  CBC  Tomorrow morning,   R        01/16/23 1036   01/16/23 1037  Ammonia  Once,   R       Comments: THIS TEST CAN'T BE ADDED PLEASE DRAW DARK GREEN ON ICE THANK YOU    01/16/23 1037   01/16/23 1033  Protime-INR  Add-on,   AD        01/16/23 1032   01/16/23 1033  APTT  Add-on,   AD        01/16/23 1032   01/16/23 0814  Pathologist smear review  Once,   R        01/16/23 0814   01/16/23 0807  Body fluid culture w Gram Stain  Once,   URGENT       Question:  Are there also cytology or pathology orders on this specimen?  Answer:  No   01/16/23 0807            Vitals/Pain Today's Vitals   01/16/23 0717 01/16/23 0718 01/16/23 0915 01/16/23  0930  BP: 139/83   136/85  Pulse: 83   80  Resp: 18   18  Temp: 98.4 F (36.9 C)     TempSrc: Oral     SpO2: 100%   100%  PainSc:  10-Worst pain ever 10-Worst pain ever     Isolation Precautions No active  isolations  Medications Medications  albuterol (PROVENTIL) (2.5 MG/3ML) 0.083% nebulizer solution 3 mL (has no administration in time range)  dextromethorphan-guaiFENesin (MUCINEX DM) 30-600 MG per 12 hr tablet 1 tablet (has no administration in time range)  ondansetron (ZOFRAN) injection 4 mg (has no administration in time range)  hydrALAZINE (APRESOLINE) injection 5 mg (has no administration in time range)  acetaminophen (TYLENOL) tablet 325 mg (has no administration in time range)  morphine (PF) 2 MG/ML injection 2 mg (has no administration in time range)  fentaNYL (SUBLIMAZE) injection 50 mcg (50 mcg Intravenous Given 01/16/23 0811)  lidocaine (PF) (XYLOCAINE) 1 % injection 5 mL (5 mLs Other Given 01/16/23 0811)  oxyCODONE (Oxy IR/ROXICODONE) immediate release tablet 5 mg (5 mg Oral Given 01/16/23 0914)    Mobility walks     Focused Assessments Cardiac Assessment Handoff:    No results found for: "CKTOTAL", "CKMB", "CKMBINDEX", "TROPONINI" Lab Results  Component Value Date   DDIMER 1.13 (H) 04/13/2021   Does the Patient currently have chest pain? No    R Recommendations: See Admitting Provider Note  Report given to:   Additional Notes:

## 2023-01-16 NOTE — Procedures (Signed)
PROCEDURE SUMMARY:  Successful image-guided paracentesis from the right lower abdomen.  Yielded 10.4 liters of yellow fluid.  No immediate complications.  EBL = trace. Patient tolerated well.   Specimen was sent for labs.  Please see imaging section of Epic for full dictation.   Kennieth Francois PA-C 01/16/2023 2:10 PM

## 2023-01-16 NOTE — ED Provider Notes (Signed)
[facility] Provider Note    Event Date/Time   First MD Initiated Contact with Patient 01/16/23 [phone]     (approximate)   History   Abdominal Pain   HPI  Tom [name] is a 57 y.o. male  who presents emergency department today with primary concern for abdominal pain.  Patient has a history of cirrhosis.  Has required paracentesis in the past.  States that he feels like his belly is significantly full at this time.  It is causing some difficulty breathing.  Additionally he says that it is hurting him today when it typically does not hurt him significantly.  He denies any fevers or chills.     Physical Exam   Triage Vital Signs: ED Triage Vitals  Encounter Vitals Group     BP 01/16/23 0717 139/83     Systolic BP Percentile --      Diastolic BP Percentile --      Pulse Rate 01/16/23 0717 83     Resp 01/16/23 0717 18     Temp 01/16/23 0717 98.4 F (36.9 C)     Temp Source 01/16/23 0717 Oral     SpO2 01/16/23 0717 100 %     Weight --      Height --      Head Circumference --      Peak Flow --      Pain Score 01/16/23 0718 10     Pain Loc --      Pain Education --      Exclude from Growth Chart --     Most recent vital signs: Vitals:   01/16/23 0717  BP: 139/83  Pulse: 83  Resp: 18  Temp: 98.4 F (36.9 C)  SpO2: 100%   General: Awake, alert, oriented. CV:  Good peripheral perfusion. Regular rate and rhythm. Resp:  Normal effort. Lungs clear. Abd:  Distended, diffusely tender.   ED Results / Procedures / Treatments   Labs (all labs ordered are listed, but only abnormal results are displayed) Labs Reviewed  CBC WITH DIFFERENTIAL/PLATELET - Abnormal; Notable for the following components:      Result Value   WBC 15.8 (*)    RBC 3.71 (*)    Hemoglobin 10.5 (*)    HCT 31.3 (*)    RDW 17.8 (*)    Neutro Abs 11.6 (*)    Monocytes Absolute 1.7 (*)    Eosinophils Absolute 0.7 (*)    Abs Immature Granulocytes 0.10 (*)    All  other components within normal limits  COMPREHENSIVE METABOLIC PANEL - Abnormal; Notable for the following components:   Glucose, Bld 109 (*)    BUN 33 (*)    Creatinine, Ser 1.53 (*)    Calcium 8.5 (*)    Total Protein 6.3 (*)    Albumin 2.1 (*)    AST 47 (*)    Total Bilirubin 1.7 (*)    GFR, Estimated 52 (*)    All other components within normal limits  BODY FLUID CULTURE W GRAM STAIN  BODY FLUID CELL COUNT WITH DIFFERENTIAL  PATHOLOGIST SMEAR REVIEW  PROTIME-INR  APTT  AMMONIA  LACTATE DEHYDROGENASE     EKG  None   RADIOLOGY None  PROCEDURES:  Critical Care performed: No  .Paracentesis  Date/Time: 01/16/2023 11:26 AM  Performed by: Phineas Semen, MD Authorized by: Phineas Semen, MD   Consent:    Consent obtained:  Verbal   Consent given by:  Patient Pre-procedure details:  Procedure purpose:  Diagnostic   Preparation: Patient was prepped and draped in usual sterile fashion   Anesthesia:    Anesthesia method:  Local infiltration   Local anesthetic:  Lidocaine 1% w/o epi Procedure details:    Needle gauge:  18   Ultrasound guidance: yes     Puncture site:  R lower quadrant   Fluid appearance:  Clear   Dressing:  4x4 sterile gauze Post-procedure details:    Procedure completion:  Tolerated    MEDICATIONS ORDERED IN ED: Medications - No data to display   IMPRESSION / MDM / ASSESSMENT AND PLAN / ED COURSE  I reviewed the triage vital signs and the nursing notes.                              Differential diagnosis includes, but is not limited to, SBP, ascites  Patient's presentation is most consistent with acute presentation with potential threat to life or bodily function.   The patient is on the cardiac monitor to evaluate for evidence of arrhythmia and/or significant heart rate changes.  Patient presented to the emergency department today because of concerns for abdominal pain and ascites.  On exam patient does have distended  abdomen.  It is diffusely tender to palpation.  Patient is afebrile.  Would have concern for SBP.  Will plan on diagnostic paracentesis here in the emergency department.  Paracentesis here without signs concerning for SBP.  However given the continued shortness of breath do think patient would benefit from admission and therapeutic paracentesis.  Discussed with Dr. Clyde Lundborg with the hospitalist service who will evaluate for admission.      FINAL CLINICAL IMPRESSION(S) / ED DIAGNOSES   Final diagnoses:  Other ascites    Note:  This document was prepared using Dragon voice recognition software and may include unintentional dictation errors.    Phineas Semen, MD 01/16/23 [phone]

## 2023-01-16 NOTE — ED Triage Notes (Signed)
Pt c/o abdominal pain from his ascites. Has it drained weekly but pt is unsure when his next appointment is. States he can't drive well anymore so difficult to get to his appointments.

## 2023-01-16 NOTE — H&P (Signed)
History and Physical    Tom [name] GNF:[id number] DOB: 1966-06-04 DOA: 01/16/2023  Referring MD/NP/PA:   PCP: [name], DO   Patient coming from:  The patient is coming from home.     Chief Complaint: Abdominal pain, abdominal distention  HPI: Tom [name] is a 57 y.o. 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  Patient states that his ascites has been progressively worsening recently.  He developed worsening abdominal distention.  He has nausea and vomited few times with nonbilious nonbloody vomiting.  No diarrhea, fever or chills.  He states that he has new abdominal pain, which is diffuse, constant, moderate, aching, nonradiating.  He also reports mild dry cough, and shortness of breath. No symptoms of UTI. ED physician did diagnostic paracentesis, the initial results showed nuclear cell 48 with 7% of neutrophil..  Data reviewed independently and ED Course: pt was found to have WBC 15.8, ammonia 23, stable renal function, temperature normal, blood pressure 136/85, heart rate 83, RR 18, oxygen saturation 100% on room air.  Patient is placed on MedSurg bed for observation.   EKG:   Not done in ED, will get one.    Review of Systems:   General: no fevers, chills, no body weight gain, has poor appetite, has fatigue HEENT: no blurry vision, hearing changes or sore throat Respiratory: has dyspnea, coughing, no wheezing CV: no chest pain, no palpitations GI: has nausea, vomiting, abdominal pain, no diarrhea, constipation GU: no dysuria, burning on urination, increased urinary frequency, hematuria  Ext: has leg edema Neuro: no unilateral weakness, numbness, or tingling, no vision change or hearing loss Skin: no rash, no skin tear. MSK: No muscle  spasm, no deformity, no limitation of range of movement in spin Heme: No easy bruising.  Travel history: No recent long distant travel.   Allergy:  Allergies  Allergen Reactions   Alfuzosin Other (See Comments)    Dizziness    Amlodipine Nausea And Vomiting   Bee Venom Swelling   Cymbalta [Duloxetine Hcl] Other (See Comments)    Emesis    Duloxetine Other (See Comments)    Emesis   Lisinopril    Flomax [Tamsulosin Hcl] Itching    Past Medical History:  Diagnosis Date   Arthritis    Asthma    COPD (chronic obstructive pulmonary disease) (HCC)    Hyperlipidemia    Hypertension    Hypokalemia 12/04/2022   Hypomagnesemia 12/04/2022   Hypomagnesemia 12/04/2022   Osteoporosis     Past Surgical History:  Procedure Laterality Date   ANKLE FUSION Right 2017   BACK SURGERY     TOTAL KNEE ARTHROPLASTY Right 04/2012    Social History:  reports that he quit smoking about 7 years ago. His smoking use included cigarettes. He started smoking about 46 years ago. He has a 39 pack-year smoking history. He has never used smokeless tobacco. He reports current drug use. Drug: Marijuana. He reports that he does not drink alcohol.  Family History:  Family History  Problem Relation Age of Onset   Lung cancer Mother    Kidney disease Father    Cancer Brother      Prior to Admission medications   Medication Sig Start Date End Date Taking? Authorizing Provider  albuterol (PROVENTIL) (2.5 MG/3ML) 0.083% nebulizer solution Take 3 mLs (2.5 mg  total) by nebulization every 4 (four) hours as needed for wheezing or shortness of breath. 11/16/22   Sreenath, Jonelle Sports, MD  albuterol (VENTOLIN HFA) 108 (90 Base) MCG/ACT inhaler Inhale 2 puffs into the lungs every 4 (four) hours as needed. 11/21/22   [provider]  budesonide-formoterol (SYMBICORT) 80-4.5 MCG/ACT inhaler Take 2 puffs first thing in am and then another 2 puffs about 12 hours later. 03/21/22   [name], [name], DO   Docusate Sodium (DSS) 100 MG CAPS TAKE 1 CAPSULE (100 MG TOTAL) BY MOUTH TWO (2) TIMES A DAY    [provider]  EPINEPHrine (EPIPEN 2-PAK) 0.3 mg/0.3 mL IJ SOAJ injection Inject 0.3 mg into the muscle as needed for anaphylaxis. 12/16/21   [name], [name], DO  famotidine (PEPCID) 20 MG tablet Take 1 tablet (20 mg total) by mouth daily after supper. One after supper 03/21/22   [name], DO  folic acid (FOLVITE) 1 MG tablet Take 1 tablet (1 mg total) by mouth daily. Patient not taking: Reported on 12/27/2022 11/03/22   [name], DO  furosemide (LASIX) 20 MG tablet Take 40 mg by mouth daily. 10/18/22   [provider]  Homeopathic Products (LIVER SUPPORT SL) Place under the tongue.    [provider]  hydrOXYzine (VISTARIL) 25 MG capsule Take 1 capsule (25 mg total) by mouth every 8 (eight) hours as needed. 10/10/22   Althea Charon, [name], DO  lactulose (CHRONULAC) 10 GM/15ML solution SMARTSIG:15 Milliliter(s) By Mouth Every Other Day 10/20/22   [name], DO  magnesium oxide (MAG-OX) 400 (240 Mg) MG tablet Take 2 tablets by mouth 2 (two) times daily. 09/14/22   [provider]  Multiple Vitamin (MULTIVITAMIN) capsule Take 1 capsule by mouth daily.    [provider]  ondansetron (ZOFRAN-ODT) 4 MG disintegrating tablet Take 1 tablet (4 mg total) by mouth every 8 (eight) hours as needed for nausea or vomiting. 10/10/22   Althea Charon, [name], DO  Oxycodone HCl 10 MG TABS Take 1 tablet (10 mg total) by mouth 4 (four) times daily as needed (modereate to severe pain). 01/03/23   [name], [name], DO  pantoprazole (PROTONIX) 40 MG tablet Take 1 tablet (40 mg total) by mouth daily before breakfast. 06/01/22   Althea Charon, [name], DO  spironolactone (ALDACTONE) 25 MG tablet Take 1 tablet (25 mg total) by mouth daily. 10/20/22   [name], [name], DO  thiamine (VITAMIN B1) 100 MG tablet Take 100  mg by mouth daily. 12/20/22   [provider]    Physical Exam: Vitals:   01/16/23 0930 01/16/23 1130 01/16/23 1300 01/16/23 1700  BP: 136/85 129/83 133/79 111/64  Pulse: 80 81 89 88  Resp: 18 15  16   Temp:  98.2 F (36.8 C)  98.2 F (36.8 C)  TempSrc:  Oral    SpO2: 100% 100% 99% 100%  Weight:  103.2 kg    Height:  6' (1.829 m)     General: Not in acute distress HEENT:       Eyes: PERRL, EOMI, has jaundice       ENT: No discharge from the ears and nose, no pharynx injection, no tonsillar enlargement.        Neck: No JVD, no bruit, no mass felt. Heme: No neck lymph node enlargement. Cardiac: S1/S2, RRR, No murmurs, No gallops or rubs. Respiratory: No rales, wheezing, rhonchi or rubs. GI: distended, with diffused tenderness, no rebound pain, no organomegaly, BS present.  GU: No hematuria Ext: 2+ pitting leg edema bilaterally. 1+DP/PT pulse bilaterally. Musculoskeletal: No joint deformities, No joint redness or warmth, no limitation of ROM in spin. Skin: No rashes.  Neuro: Alert, oriented X3, cranial nerves II-XII grossly intact, moves all extremities normally.  Psych: Patient is not psychotic, no suicidal or hemocidal ideation.  Labs on Admission: I have personally reviewed following labs and imaging studies  CBC: Recent Labs  Lab 01/16/23 0729  WBC 15.8*  NEUTROABS 11.6*  HGB 10.5*  HCT 31.3*  MCV 84.4  PLT 262   Basic Metabolic Panel: Recent Labs  Lab 01/16/23 0729  NA 138  K 4.0  CL 107  CO2 22  GLUCOSE 109*  BUN 33*  CREATININE 1.53*  CALCIUM 8.5*   GFR: Estimated Creatinine Clearance: 65.4 mL/min (A) (by C-G formula based on SCr of 1.53 mg/dL (H)). Liver Function Tests: Recent Labs  Lab 01/16/23 0729  AST 47*  ALT 23  ALKPHOS 77  BILITOT 1.7*  PROT 6.3*  ALBUMIN 2.1*   No results for input(s): "LIPASE", "AMYLASE" in the last 168 hours.  Recent Labs  Lab 01/16/23 1057  AMMONIA 23   Coagulation Profile: Recent Labs  Lab  01/16/23 0729  INR 1.3*   Cardiac Enzymes: No results for input(s): "CKTOTAL", "CKMB", "CKMBINDEX", "TROPONINI" in the last 168 hours. BNP (last 3 results) No results for input(s): "PROBNP" in the last 8760 hours. HbA1C: No results for input(s): "HGBA1C" in the last 72 hours. CBG: No results for input(s): "GLUCAP" in the last 168 hours. Lipid Profile: No results for input(s): "CHOL", "HDL", "LDLCALC", "TRIG", "CHOLHDL", "LDLDIRECT" in the last 72 hours. Thyroid Function Tests: No results for input(s): "TSH", "T4TOTAL", "FREET4", "T3FREE", "THYROIDAB" in the last 72 hours. Anemia Panel: No results for input(s): "VITAMINB12", "FOLATE", "FERRITIN", "TIBC", "IRON", "RETICCTPCT" in the last 72 hours. Urine analysis:    Component Value Date/Time   COLORURINE YELLOW (A) 11/15/2022 1810   APPEARANCEUR HAZY (A) 11/15/2022 1810   LABSPEC 1.020 11/15/2022 1810   PHURINE 6.0 11/15/2022 1810   GLUCOSEU NEGATIVE 11/15/2022 1810   HGBUR LARGE (A) 11/15/2022 1810   BILIRUBINUR NEGATIVE 11/15/2022 1810   BILIRUBINUR Negative 08/24/2021 1516   KETONESUR NEGATIVE 11/15/2022 1810   PROTEINUR 30 (A) 11/15/2022 1810   UROBILINOGEN 0.2 08/24/2021 1516   NITRITE NEGATIVE 11/15/2022 1810   LEUKOCYTESUR NEGATIVE 11/15/2022 1810   Sepsis Labs: @LABRCNTIP (procalcitonin:4,lacticidven:4) ) Recent Results (from the past 240 hour(s))  Body fluid culture w Gram Stain     Status: None (Preliminary result)   Collection Time: 01/16/23  8:14 AM   Specimen: Peritoneal Washings; Body Fluid  Result Value Ref Range Status   Specimen Description   Final    PERITONEAL Performed at [facility], [address] [place], [place] [address]    Special Requests   Final    NONE Performed at [facility], [address] [place], [place] [address]    Gram Stain   Final    NO WBC SEEN NO ORGANISMS SEEN Performed at [facility], [address], [place], [place] [address]    Culture  PENDING  Incomplete   Report Status PENDING  Incomplete     Radiological Exams on Admission: US Paracentesis  Result Date: 01/16/2023 INDICATION: Shortness of breath, abdominal pain, abdominal distention. Request received for diagnostic and therapeutic paracentesis EXAM: ULTRASOUND GUIDED diagnostic and therapeutic PARACENTESIS MEDICATIONS: 7 cc 1% lidocaine COMPLICATIONS: None immediate. PROCEDURE: Informed written consent was obtained from the patient  after a discussion of the risks, benefits and alternatives to treatment. A timeout was performed prior to the initiation of the procedure. Initial ultrasound scanning demonstrates a large amount of ascites within the right lower abdominal quadrant. The right lower abdomen was prepped and draped in the usual sterile fashion. 1% lidocaine was used for local anesthesia. Following this, a 19 gauge, 7-cm, Yueh catheter was introduced. An ultrasound image was saved for documentation purposes. The paracentesis was performed. The catheter was removed and a dressing was applied. The patient tolerated the procedure well without immediate post procedural complication. Patient received post-procedure intravenous albumin; see nursing notes for details. FINDINGS: A total of approximately 10.4 L of yellow fluid was removed. Samples were sent to the laboratory as requested by the clinical team. IMPRESSION: Successful ultrasound-guided paracentesis yielding 10.4 liters of peritoneal fluid. Procedure performed by Mina Marble, PA-C Electronically Signed   By: Olive Bass M.D.   On: 01/16/2023 14:33      Assessment/Plan Principal Problem:   Alcoholic cirrhosis of liver with ascites (HCC) Active Problems:   Abdominal pain   Essential hypertension   COPD with asthma   GERD without esophagitis   Chronic kidney disease, stage 3a (HCC)   Obesity (BMI 30-39.9)   Assessment and Plan:   Alcoholic cirrhosis of liver with ascites and abdominal pain: Patient has  worsening ascites.  Initial diagnostic paracentesis showed nuclear cell 48 with 7% of neutrophil, not consistent with SBP, however patient has leukocytosis with WBC 15.8 which is new, and diffuse abdominal pain, which started antibiotics empirically until fluid culture comes back.  Ammonia level 23.  Patient is not taking lactulose currently.  Mental status normal  -will place med-surg bed for obs -IV Rocephin -Ordered paracentesis -Albumin 25 g -Follow-up fluid analysis and culture -Pain control: As needed morphine and oxycodone  Essential hypertension -IV hydralazine as needed -Patient is on Lasix and spironolactone  COPD with asthma: Stable.  Patient is worsening shortness of breath is likely due to abdominal distention, no wheezing or rhonchi on auscultation. -Bronchodilators as needed  GERD without esophagitis -Protonix and Pepcid  Chronic kidney disease, stage 3a (HCC): Stable.  Recent baseline creatinine 1.3-1.6.  His creatinine is 1.53, BUN 33, GFR 52 -Follow-up with BMP  Obesity (BMI 30-39.9): Body weight well 3.2 kg, BMI 30.86 -Encourage losing weight -Exercise and healthy diet      DVT ppx: SCD  Code Status: Full code   Family Communication: not done, no family member is at bed side.    Disposition Plan:  Anticipate discharge back to previous environment  Consults called:  none  Admission status and Level of care: Med-Surg:    for obs   Dispo: The patient is from: Home              Anticipated d/c is to: Home              Anticipated d/c date is: 2 days              Patient currently is not medically stable to d/c.      Severity of Illness:  The appropriate patient status for this patient is INPATIENT. Inpatient status is judged to be reasonable and necessary in order to provide the required intensity of service to ensure the patient's safety. The patient's presenting symptoms, physical exam findings, and initial radiographic and laboratory data in the  context of their chronic comorbidities is felt to place them at high risk for further clinical deterioration.  Furthermore, it is not anticipated that the patient will be medically stable for discharge from the hospital within 2 midnights of admission.   * I certify that at the point of admission it is my clinical judgment that the patient will require inpatient hospital care spanning beyond 2 midnights from the point of admission due to high intensity of service, high risk for further deterioration and high frequency of surveillance required.*       Date of Service 01/16/2023    Lorretta Harp [facility]   If 7PM-7AM, please contact night-coverage [email or web address] 01/16/2023, 5:43 PM

## 2023-01-16 NOTE — Plan of Care (Signed)

## 2023-01-17 ENCOUNTER — Other Ambulatory Visit: Payer: Self-pay | Admitting: Family Medicine

## 2023-01-17 DIAGNOSIS — K7031 Alcoholic cirrhosis of liver with ascites: Secondary | ICD-10-CM

## 2023-01-17 NOTE — Progress Notes (Signed)
[facility] Liaison note:   This patient is currently enrolled in [facility] outpatient-based palliative care. We will continue to follow for discharge disposition.    Please call for any outpatient based palliative care related questions or concerns.   [facility] Liaison [phone]

## 2023-01-17 NOTE — Progress Notes (Signed)
Mobility Specialist - Progress Note   01/17/23 1100  Mobility  Activity Ambulated independently in hallway  Level of Assistance Modified independent, requires aide device or extra time  Assistive Device Front wheel walker  Distance Ambulated (ft) 230 ft  Activity Response Tolerated well  $Mobility charge 1 Mobility     Pt standing at bedside on arrival, utilizing RA. Pt motivated to ambulate. Able to don boot on RLE independently. Ambulated in hallway modI, no LOB. VC to opt for pushing RW rather than lifting. Pt left in bed with needs in reach.    Tom [name] Mobility Specialist 01/17/23, 11:55 AM

## 2023-01-17 NOTE — Discharge Summary (Signed)
Physician Discharge Summary  Tom [name] WUJ:[id number] DOB: Apr 04, 1966 DOA: 01/16/2023  PCP: [name], DO  Admit date: 01/16/2023 Discharge date: 01/17/2023  Admitted From: Home Disposition:  Home  Recommendations for Outpatient Follow-up:  Follow up with PCP in 1-2 weeks Call PCP scheduler to set up outpatient paracentesis  Home Health:No Equipment/Devices:None   Discharge Condition:Stable  CODE STATUS:FULL  Diet recommendation: Low Na  Brief/Interim Summary:  57 y.o. 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   Patient states that his ascites has been progressively worsening recently.  He developed worsening abdominal distention.  He has nausea and vomited few times with nonbilious nonbloody vomiting.  No diarrhea, fever or chills.  He states that he has new abdominal pain, which is diffuse, constant, moderate, aching, nonradiating.  He also reports mild dry cough, and shortness of breath. No symptoms of UTI. ED physician did diagnostic paracentesis, the initial results showed nuclear cell 48 with 7% of neutrophil..  Patient underwent large-volume paracentesis with interventional radiology.  10.4 L fluid removed.  Fluid overall bland appearing.  Consistent with known ascites/liver failure.  No evidence of spontaneous bacterial peritonitis.  Patient will be discharged home at this time.  I was able to reach out to the patient's primary care physician who agreed to order weekly paracentesis for the next 4 weeks.  I had a lengthy discussion with patient at bedside.  Stressed the need of importance of follow-up with his primary care physician so he can get repeat paracentesis.  Patient will also need to follow-up with his hepatology team at [facility] to discuss further  management of his cirrhosis and consideration for possible TIPS procedure.    Discharge Diagnoses:  Principal Problem:   Alcoholic cirrhosis of liver with ascites (HCC) Active Problems:   Abdominal pain   Essential hypertension   COPD with asthma   GERD without esophagitis   Chronic kidney disease, stage 3a (HCC)   Obesity (BMI 30-39.9)  Alcoholic cirrhosis of liver with ascites and abdominal pain: Patient has worsening ascites.  Initial diagnostic paracentesis showed nuclear cell 48 with 7% of neutrophil, not consistent with SBP.  Large-volume paracentesis also not consistent with SBP.  Patient not encephalopathic.  Underwent large-volume paracentesis with 10.4 L fluid removed.  Was able to communicate with the patient's outpatient primary care doctor who agreed to order paracentesis weekly for the next 4 weeks.  I stressed the need of importance with regular follow-up with his primary care physician as well as his liver team at [facility]  Patient expressed understanding.  Stable for discharge home at this time.  Discharge Instructions  Discharge Instructions     Diet - low sodium heart healthy   Complete by: As directed    Increase activity slowly   Complete by: As directed       Allergies as of 01/17/2023       Reactions   Alfuzosin Other (See Comments)   Dizziness    Amlodipine Nausea And Vomiting   Bee Venom Swelling   Cymbalta [duloxetine Hcl] Other (See Comments)   Emesis   Duloxetine Other (See Comments)   Emesis   Lisinopril    Flomax [tamsulosin Hcl] Itching        Medication List     STOP taking these medications    folic acid 1 MG  tablet Commonly known as: FOLVITE       TAKE these medications    albuterol (2.5 MG/3ML) 0.083% nebulizer solution Commonly known as: PROVENTIL Take 3 mLs (2.5 mg total) by nebulization every 4 (four) hours as needed for wheezing or shortness of breath.   albuterol 108 (90 Base) MCG/ACT inhaler Commonly known as: VENTOLIN  HFA Inhale 2 puffs into the lungs every 4 (four) hours as needed.   budesonide-formoterol 80-4.5 MCG/ACT inhaler Commonly known as: Symbicort Take 2 puffs first thing in am and then another 2 puffs about 12 hours later.   DSS 100 MG Caps TAKE 1 CAPSULE (100 MG TOTAL) BY MOUTH TWO (2) TIMES A DAY   EPINEPHrine 0.3 mg/0.3 mL Soaj injection Commonly known as: EpiPen 2-Pak Inject 0.3 mg into the muscle as needed for anaphylaxis.   famotidine 20 MG tablet Commonly known as: Pepcid Take 1 tablet (20 mg total) by mouth daily after supper. One after supper   furosemide 20 MG tablet Commonly known as: LASIX Take 40 mg by mouth daily.   hydrOXYzine 25 MG capsule Commonly known as: VISTARIL Take 1 capsule (25 mg total) by mouth every 8 (eight) hours as needed.   lactulose 10 GM/15ML solution Commonly known as: CHRONULAC SMARTSIG:15 Milliliter(s) By Mouth Every Other Day   LIVER SUPPORT SL Place under the tongue.   magnesium oxide 400 (240 Mg) MG tablet Commonly known as: MAG-OX Take 2 tablets by mouth 2 (two) times daily.   multivitamin capsule Take 1 capsule by mouth daily.   ondansetron 4 MG disintegrating tablet Commonly known as: ZOFRAN-ODT Take 1 tablet (4 mg total) by mouth every 8 (eight) hours as needed for nausea or vomiting.   Oxycodone HCl 10 MG Tabs Take 1 tablet (10 mg total) by mouth 4 (four) times daily as needed (modereate to severe pain).   pantoprazole 40 MG tablet Commonly known as: Protonix Take 1 tablet (40 mg total) by mouth daily before breakfast.   spironolactone 25 MG tablet Commonly known as: ALDACTONE Take 1 tablet (25 mg total) by mouth daily.   thiamine 100 MG tablet Commonly known as: VITAMIN B1 Take 100 mg by mouth daily.        Follow-up Information     Paulla Fore. Call today.   Why: Please call Ms. Jenne Pane TODAY after you are discharged so she can arrange your outpatient paracentesis. Ms. Paulla Fore said call her when you need  your paracentesis. Contact information: [phone]        [name], DO. Go on 01/31/2023.   Specialty: Family Medicine Why: Go at 2:00pm. Contact information: [address] [place] [address] [phone]                Allergies  Allergen Reactions   Alfuzosin Other (See Comments)    Dizziness    Amlodipine Nausea And Vomiting   Bee Venom Swelling   Cymbalta [Duloxetine Hcl] Other (See Comments)    Emesis    Duloxetine Other (See Comments)    Emesis   Lisinopril    Flomax [Tamsulosin Hcl] Itching    Consultations: Interventional radiology   Procedures/Studies: US Paracentesis  Result Date: 01/16/2023 INDICATION: Shortness of breath, abdominal pain, abdominal distention. Request received for diagnostic and therapeutic paracentesis EXAM: ULTRASOUND GUIDED diagnostic and therapeutic PARACENTESIS MEDICATIONS: 7 cc 1% lidocaine COMPLICATIONS: None immediate. PROCEDURE: Informed written consent was obtained from the patient after a discussion of the risks, benefits and alternatives to treatment. A timeout was  performed prior to the initiation of the procedure. Initial ultrasound scanning demonstrates a large amount of ascites within the right lower abdominal quadrant. The right lower abdomen was prepped and draped in the usual sterile fashion. 1% lidocaine was used for local anesthesia. Following this, a 19 gauge, 7-cm, Yueh catheter was introduced. An ultrasound image was saved for documentation purposes. The paracentesis was performed. The catheter was removed and a dressing was applied. The patient tolerated the procedure well without immediate post procedural complication. Patient received post-procedure intravenous albumin; see nursing notes for details. FINDINGS: A total of approximately 10.4 L of yellow fluid was removed. Samples were sent to the laboratory as requested by the clinical team. IMPRESSION: Successful ultrasound-guided paracentesis yielding  10.4 liters of peritoneal fluid. Procedure performed by Mina Marble, PA-C Electronically Signed   By: Olive Bass M.D.   On: 01/16/2023 14:33   US Paracentesis  Result Date: 01/09/2023 INDICATION: Shortness, abdominal distension, recurrent ascites EXAM: ULTRASOUND GUIDED THERAPEUTIC PARACENTESIS MEDICATIONS: 1% LIDOCAINE LOCAL COMPLICATIONS: None immediate. PROCEDURE: Informed written consent was obtained from the patient after a discussion of the risks, benefits and alternatives to treatment. A timeout was performed prior to the initiation of the procedure. Initial ultrasound scanning demonstrates a large amount of ascites within the right lower abdominal quadrant. The right lower abdomen was prepped and draped in the usual sterile fashion. 1% lidocaine was used for local anesthesia. Following this, a 6 Fr Safe-T-Centesis catheter was introduced. An ultrasound image was saved for documentation purposes. The paracentesis was performed. The catheter was removed and a dressing was applied. The patient tolerated the procedure well without immediate post procedural complication. FINDINGS: A total of approximately 9.6 L of clear peritoneal fluid was removed. IMPRESSION: Successful ultrasound-guided paracentesis yielding 9.6 liters. Electronically Signed   By: Judie Petit.  Shick M.D.   On: 01/09/2023 16:27   DG Chest Port 1 View  Result Date: 01/09/2023 CLINICAL DATA:  Weight gain.  Dyspnea. EXAM: PORTABLE CHEST 1 VIEW COMPARISON:  01/06/2023 and 04/13/2021 FINDINGS: Single-view of the chest again demonstrates numerous bilateral calcified granulomas. No airspace disease or pulmonary edema. Heart size is normal. Trachea is midline. Surgical plate and screws involving the right clavicle. Negative for a pneumothorax. IMPRESSION: 1. No acute cardiopulmonary disease. 2. Old granulomatous disease. Electronically Signed   By: Richarda Overlie M.D.   On: 01/09/2023 10:20   DG Chest 2 View  Result Date: 01/06/2023 CLINICAL DATA:   Shortness of breath with swelling of the right foot and stomach swelling. History of cirrhosis. EXAM: CHEST - 2 VIEW COMPARISON:  12/15/2022 FINDINGS: Shallow inspiration. Heart size and pulmonary vascularity are normal for technique. Linear atelectasis in the lung bases. Scattered calcified granulomas throughout both lungs. No airspace disease or consolidation. No pleural effusions. No pneumothorax. Mediastinal contours appear intact. Postoperative changes with internal fixation of an old right clavicular fracture. IMPRESSION: Shallow inspiration with atelectasis in the lung bases. No evidence of active pulmonary disease. Electronically Signed   By: Burman Nieves M.D.   On: 01/06/2023 15:16   US Paracentesis  Result Date: 12/29/2022 INDICATION: Patient with end-stage liver disease who follows with [facility] liver center. Patient is currently admitted with symptomatic ascites request received for therapeutic paracentesis. EXAM: ULTRASOUND GUIDED  PARACENTESIS MEDICATIONS: Local 1% lidocaine only. COMPLICATIONS: None immediate. PROCEDURE: Informed written consent was obtained from the patient after a discussion of the risks, benefits and alternatives to treatment. A timeout was performed prior to the initiation of the procedure. Initial ultrasound scanning demonstrates  a large amount of ascites within the right upper abdominal quadrant. The right upper abdomen was prepped and draped in the usual sterile fashion. 1% lidocaine was used for local anesthesia. Following this, a 19 gauge, 7-cm, Yueh catheter was introduced. An ultrasound image was saved for documentation purposes. The paracentesis was performed. The catheter was removed and a dressing was applied. The patient tolerated the procedure well without immediate post procedural complication. FINDINGS: A total of approximately 7.1 L of clear yellow fluid was removed. IMPRESSION: Successful ultrasound-guided paracentesis yielding 7.1 liters of peritoneal fluid.  PLAN: Per chart review, the patient is being followed by [facility] liver center. This exam was performed by Pattricia Boss PA-C, and was supervised and interpreted by Dr. Juliette Alcide. Electronically Signed   By: Olive Bass M.D.   On: 12/29/2022 16:12   DG Foot Complete Right  Result Date: 12/26/2022 CLINICAL DATA:  Fall.  Right knee and foot pain. EXAM: RIGHT FOOT COMPLETE - 3+ VIEW COMPARISON:  CT 11/15/2022 FINDINGS: Again seen periosteal reaction involving the distal tibia extending to the ankle joint, no significant change from prior CT. Portions of screws in the distal tibia. Two screws traverse the talocalcaneal joint. Lucency adjacent to 1 of the talocalcaneal screws measures up to 4 cm. This is also unchanged from prior exam. Moderate talonavicular spurring. Mild degenerative change of the first metatarsal phalangeal joint. There is no evidence of acute fracture. Marked generalized soft tissue edema. Scattered punctate densities in the soft tissues, chronic. IMPRESSION: 1. No acute fracture of the right foot. 2. Marked generalized soft tissue edema. 3. 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 Postsurgical change in the distal tibia and talocalcaneal fusion. Lucency adjacent to the talocalcaneal screws is also unchanged. Differential considerations are loosening and infection. Electronically Signed   By: Narda Rutherford M.D.   On: 12/26/2022 18:16   DG Knee Complete 4 Views Right  Result Date: 12/26/2022 CLINICAL DATA:  Fall, right knee and foot pain. EXAM: RIGHT KNEE - COMPLETE 4+ VIEW COMPARISON:  None Available. FINDINGS: Right knee arthroplasty. No acute or periprosthetic fracture. There is no periprosthetic lucency. No joint effusion. Prior patellar resurfacing. No significant knee joint effusion. Generalized soft tissue edema. No soft tissue gas. IMPRESSION: 1. Right knee arthroplasty without complication. No acute fracture. 2. Soft  tissue edema. Electronically Signed   By: Narda Rutherford M.D.   On: 12/26/2022 18:13      Subjective: Seen and examined the day of discharge.  Stable no distress.  Appropriate for discharge home.  Discharge Exam: Vitals:   01/17/23 0411 01/17/23 0906  BP: 119/72 130/75  Pulse: 80 88  Resp: 20 18  Temp: 98.4 F (36.9 C) 98.2 F (36.8 C)  SpO2: 99% 100%   Vitals:   01/16/23 1700 01/16/23 2015 01/17/23 0411 01/17/23 0906  BP: 111/64 115/68 119/72 130/75  Pulse: 88 87 80 88  Resp: 16 16 20 18   Temp: 98.2 F (36.8 C) 98.3 F (36.8 C) 98.4 F (36.9 C) 98.2 F (36.8 C)  TempSrc:   Oral Oral  SpO2: 100% 99% 99% 100%  Weight:      Height:        General: Pt is alert, awake, not in acute distress Cardiovascular: RRR, S1/S2 +, no rubs, no gallops Respiratory: CTA bilaterally, no wheezing, no rhonchi Abdominal: Soft, mild distention, normal bowel sounds Extremities: no edema, no cyanosis    The results of significant diagnostics from this  hospitalization (including imaging, microbiology, ancillary and laboratory) are listed below for reference.     Microbiology: Recent Results (from the past 240 hour(s))  Body fluid culture w Gram Stain     Status: None (Preliminary result)   Collection Time: 01/16/23  8:14 AM   Specimen: Peritoneal Washings; Body Fluid  Result Value Ref Range Status   Specimen Description   Final    PERITONEAL Performed at [facility], [address] [place], [place] [address]    Special Requests   Final    NONE Performed at [facility], [address] [place], [place] [address]    Gram Stain NO WBC SEEN NO ORGANISMS SEEN   Final   Culture   Final    NO GROWTH < 24 HOURS Performed at [facility], [address] [place], [place] [address]    Report Status PENDING  Incomplete  Body fluid culture w Gram Stain     Status: None (Preliminary result)   Collection Time: 01/16/23  1:41 PM   Specimen: PATH Cytology  Peritoneal fluid  Result Value Ref Range Status   Specimen Description   Final    PERITONEAL Performed at [facility], [address] [place], [place] [address]    Special Requests   Final    NONE Performed at [facility], [address] [place], [place] [address]    Gram Stain NO WBC SEEN NO ORGANISMS SEEN CYTOSPIN SMEAR   Final   Culture   Final    NO GROWTH < 24 HOURS Performed at [facility], [address] [place], [place] [address]    Report Status PENDING  Incomplete     Labs: BNP (last 3 results) Recent Labs    12/15/22 1912 01/09/23 1049  BNP 137.5* 202.1*   Basic Metabolic Panel: Recent Labs  Lab 01/16/23 0729 01/17/23 0458  NA 138 135  K 4.0 3.5  CL 107 106  CO2 22 22  GLUCOSE 109* 96  BUN 33* 25*  CREATININE 1.53* 1.24  CALCIUM 8.5* 7.8*   Liver Function Tests: Recent Labs  Lab 01/16/23 0729 01/17/23 0458  AST 47* 39  ALT 23 19  ALKPHOS 77 72  BILITOT 1.7* 0.8  PROT 6.3* 5.5*  ALBUMIN 2.1* 2.0*   No results for input(s): "LIPASE", "AMYLASE" in the last 168 hours. Recent Labs  Lab 01/16/23 1057  AMMONIA 23   CBC: Recent Labs  Lab 01/16/23 0729 01/17/23 0458  WBC 15.8* 13.4*  NEUTROABS 11.6*  --   HGB 10.5* 9.9*  HCT 31.3* 29.1*  MCV 84.4 83.4  PLT 262 213   Cardiac Enzymes: No results for input(s): "CKTOTAL", "CKMB", "CKMBINDEX", "TROPONINI" in the last 168 hours. BNP: Invalid input(s): "POCBNP" CBG: No results for input(s): "GLUCAP" in the last 168 hours. D-Dimer No results for input(s): "DDIMER" in the last 72 hours. Hgb A1c No results for input(s): "HGBA1C" in the last 72 hours. Lipid Profile No results for input(s): "CHOL", "HDL", "LDLCALC", "TRIG", "CHOLHDL", "LDLDIRECT" in the last 72 hours. Thyroid function studies No results for input(s): "TSH", "T4TOTAL", "T3FREE", "THYROIDAB" in the last 72 hours.  Invalid input(s): "FREET3" Anemia work up No results for input(s):  "VITAMINB12", "FOLATE", "FERRITIN", "TIBC", "IRON", "RETICCTPCT" in the last 72 hours. Urinalysis    Component Value Date/Time   COLORURINE YELLOW (A) 11/15/2022 1810   APPEARANCEUR HAZY (A) 11/15/2022 1810   LABSPEC 1.020 11/15/2022 1810   PHURINE 6.0 11/15/2022 1810  GLUCOSEU NEGATIVE 11/15/2022 1810   HGBUR LARGE (A) 11/15/2022 1810   BILIRUBINUR NEGATIVE 11/15/2022 1810   BILIRUBINUR Negative 08/24/2021 1516   KETONESUR NEGATIVE 11/15/2022 1810   PROTEINUR 30 (A) 11/15/2022 1810   UROBILINOGEN 0.2 08/24/2021 1516   NITRITE NEGATIVE 11/15/2022 1810   LEUKOCYTESUR NEGATIVE 11/15/2022 1810   Sepsis Labs Recent Labs  Lab 01/16/23 0729 01/17/23 0458  WBC 15.8* 13.4*   Microbiology Recent Results (from the past 240 hour(s))  Body fluid culture w Gram Stain     Status: None (Preliminary result)   Collection Time: 01/16/23  8:14 AM   Specimen: Peritoneal Washings; Body Fluid  Result Value Ref Range Status   Specimen Description   Final    PERITONEAL Performed at [facility], [address] [place], [place] [address]    Special Requests   Final    NONE Performed at [facility], [address] [place], [place] [address]    Gram Stain NO WBC SEEN NO ORGANISMS SEEN   Final   Culture   Final    NO GROWTH < 24 HOURS Performed at [facility], [address], [place], [place] [address]    Report Status PENDING  Incomplete  Body fluid culture w Gram Stain     Status: None (Preliminary result)   Collection Time: 01/16/23  1:41 PM   Specimen: PATH Cytology Peritoneal fluid  Result Value Ref Range Status   Specimen Description   Final    PERITONEAL Performed at [facility], [address], [place], [place] [address]    Special Requests   Final    NONE Performed at [facility], [address] [place], [place] [address]    Gram Stain NO WBC SEEN NO ORGANISMS SEEN CYTOSPIN SMEAR   Final   Culture   Final    NO GROWTH  < 24 HOURS Performed at [facility], [address], [place], [place] [address]    Report Status PENDING  Incomplete     Time coordinating discharge: Over 30 minutes  SIGNED:   [name], MD  [facility] 01/17/2023, 12:36 PM Pager   If 7PM-7AM, please contact night-coverage

## 2023-01-18 ENCOUNTER — Telehealth: Payer: Self-pay

## 2023-01-18 DIAGNOSIS — M869 Osteomyelitis, unspecified: Secondary | ICD-10-CM | POA: Diagnosis not present

## 2023-01-18 NOTE — Transitions of Care (Post Inpatient/ED Visit) (Signed)
   01/18/2023  Name: Tom [name] MRN: [id number] DOB: 1966/03/18  The patient was given information about care management services as a benefit of their Medicaid health plan today.    Patient                                              agreed to services and verbal consent obtained.    Patient has been scheduled with RNCM and BSW.  BSW sent patient a list of food resources.   Abelino Derrick, MHA [facility]  Managed [facility] Social Worker [phone]

## 2023-01-18 NOTE — Transitions of Care (Post Inpatient/ED Visit) (Signed)
   01/18/2023  Name: Tom [name] MRN: [id number] DOB: [birth year]  Today's TOC FU Call Status: Today's TOC FU Call Status:: Successful TOC FU Call Completed TOC FU Call Complete Date: 01/18/23  Transition Care Management Follow-up Telephone Call Date of Discharge: 01/17/23 Discharge Facility: [facility]) Type of Discharge: Emergency Department How have you been since you were released from the hospital?: Better Any questions or concerns?: No Patient Questions/Concerns:: Patient completed infusion today  Items Reviewed: Did you receive and understand the discharge instructions provided?: Yes Medications obtained,verified, and reconciled?: No Any new allergies since your discharge?: No Dietary orders reviewed?: NA Do you have support at home?: Yes  Medications Reviewed Today: Medications Reviewed Today   Medications were not reviewed in this encounter     Home Care and Equipment/Supplies: Were Home Health Services Ordered?: NA Any new equipment or medical supplies ordered?: NA  Functional Questionnaire: Do you need assistance with bathing/showering or dressing?: No Do you need assistance with meal preparation?: No Do you need assistance with eating?: No Do you have difficulty maintaining continence: No Do you need assistance with getting out of bed/getting out of a chair/moving?: No Do you have difficulty managing or taking your medications?: No  Follow up appointments reviewed: PCP Follow-up appointment confirmed?: Yes Date of PCP follow-up appointment?: 01/31/23 Do you understand care options if your condition(s) worsen?: Yes-patient verbalized understanding   SDOH Interventions    Flowsheet Row Telephone from 01/11/2023 in [place] [facility]UL[facility] DEPARTMENT ED to Hosp-Admission (Discharged) from 12/26/2022 in [facility] ED to Hosp-Admission (Discharged) from 12/15/2022 in [facility] Patient Outreach Telephone from 10/19/2022 in [place] POPULATION [facility]ALTH DEPARTMENT Telephone from 09/28/2022 in [place] POPULATION HEALTH DEPARTMENT  SDOH Interventions       Transportation Interventions Intervention Not Indicated Inpatient TOC, Other (Comment) Inpatient TOC -- Intervention Not Indicated  Utilities Interventions -- -- -- Intervention Not Indicated --        Gus Puma, BSW, [facility]  Managed [facility] Social Worker [phone]

## 2023-01-20 ENCOUNTER — Ambulatory Visit: Payer: Medicaid Other | Admitting: Physician Assistant

## 2023-01-20 ENCOUNTER — Encounter: Payer: Self-pay | Admitting: Physician Assistant

## 2023-01-20 VITALS — BP 126/71 | HR 102 | Temp 96.1°F | Wt 221.0 lb

## 2023-01-20 DIAGNOSIS — M79671 Pain in right foot: Secondary | ICD-10-CM | POA: Diagnosis not present

## 2023-01-20 DIAGNOSIS — G8929 Other chronic pain: Secondary | ICD-10-CM

## 2023-01-20 DIAGNOSIS — K7031 Alcoholic cirrhosis of liver with ascites: Secondary | ICD-10-CM

## 2023-01-20 DIAGNOSIS — T8484XA Pain due to internal orthopedic prosthetic devices, implants and grafts, initial encounter: Secondary | ICD-10-CM

## 2023-01-20 DIAGNOSIS — K7210 Chronic hepatic failure without coma: Secondary | ICD-10-CM

## 2023-01-20 DIAGNOSIS — Z09 Encounter for follow-up examination after completed treatment for conditions other than malignant neoplasm: Secondary | ICD-10-CM

## 2023-01-20 DIAGNOSIS — Z515 Encounter for palliative care: Secondary | ICD-10-CM | POA: Diagnosis not present

## 2023-01-20 MED ORDER — OXYCODONE HCL 10 MG PO TABS
10.0000 mg | ORAL_TABLET | Freq: Four times a day (QID) | ORAL | 0 refills | Status: AC | PRN
Start: 2023-01-20 — End: 2023-01-23

## 2023-01-20 NOTE — Assessment & Plan Note (Signed)
Chronic, historic condition, appears unstable at this time He was recently seen and hospitalized for ascites and had 10.4 L removed He was instructed to have provider set up weekly paracentesis apts for management Reviewed that his PCP and other providers in office have stated they are not able/comfortable to place these orders and he will need to have this organized by his hepatology provider  Dr. Ruffin Frederick office information was provided in AVS Recommend prompt follow up with this specialty for adequate management  Follow up with PCP for monitoring and coordination of care

## 2023-01-20 NOTE — Telephone Encounter (Signed)
[facility] calling concerning the pt's Oxycodone HCl 10 MG TABS . Pharmacist states pt is locked into a specific pharmacy for control meds.  So they cannot fill this Rx. Pharmacist states pt should know what pharmacy his controlled substance prescriptions are locked to.   Please advise

## 2023-01-20 NOTE — Progress Notes (Signed)
Acute Office Visit   Patient: Tom [name]   DOB: 10/07/1965   57 y.o. Male  MRN: [id number] Visit Date: 01/20/2023  Today's healthcare provider: Oswaldo Conroy , PA-C  Introduced myself to the patient as a Secondary school teacher and provided education on APPs in clinical practice.    Chief Complaint  Patient presents with   Hospitalization Follow-up   Ascites   Subjective    HPI     He reports he was told that PCP will need to schedule weekly paracentesis for management He is still seeing his Hepatology provider  He denies concerns for paracentesis sites  He states he also needs TIPS procedure to be set up - discussed that hepatology would be responsible for managing and ordering this     Transition of Care Hospital Follow up.   Hospital/Facility: [facility]  D/C Physician: Lolita Patella, MD  D/C Date: 01/17/23  Records Requested:  Records Received:  Records Reviewed:   Diagnoses on Discharge:  Principal Problem:   Alcoholic cirrhosis of liver with ascites (HCC) Active Problems:   Abdominal pain   Essential hypertension   COPD with asthma   GERD without esophagitis   Chronic kidney disease, stage 3a (HCC)   Obesity (BMI 30-39.9)  Date of interactive Contact within 48 hours of discharge: 01/18/23 Contact was through: phone  Date of 7 day or 14 day face-to-face visit:    within 7 days  Outpatient Encounter Medications as of 01/20/2023  Medication Sig Note   albuterol (PROVENTIL) (2.5 MG/3ML) 0.083% nebulizer solution Take 3 mLs (2.5 mg total) by nebulization every 4 (four) hours as needed for wheezing or shortness of breath.    albuterol (VENTOLIN HFA) 108 (90 Base) MCG/ACT inhaler Inhale 2 puffs into the lungs every 4 (four) hours as needed.    budesonide-formoterol (SYMBICORT) 80-4.5 MCG/ACT inhaler Take 2 puffs first thing in am and then another 2 puffs about 12 hours later.    Docusate Sodium (DSS) 100 MG CAPS TAKE 1 CAPSULE (100 MG TOTAL) BY MOUTH TWO (2)  TIMES A DAY    EPINEPHrine (EPIPEN 2-PAK) 0.3 mg/0.3 mL IJ SOAJ injection Inject 0.3 mg into the muscle as needed for anaphylaxis.    famotidine (PEPCID) 20 MG tablet Take 1 tablet (20 mg total) by mouth daily after supper. One after supper    furosemide (LASIX) 20 MG tablet Take 40 mg by mouth daily.    Homeopathic Products (LIVER SUPPORT SL) Place under the tongue.    hydrOXYzine (VISTARIL) 25 MG capsule Take 1 capsule (25 mg total) by mouth every 8 (eight) hours as needed.    lactulose (CHRONULAC) 10 GM/15ML solution SMARTSIG:15 Milliliter(s) By Mouth Every Other Day 12/27/2022: Patient takes every day.   magnesium oxide (MAG-OX) 400 (240 Mg) MG tablet Take 2 tablets by mouth 2 (two) times daily.    Multiple Vitamin (MULTIVITAMIN) capsule Take 1 capsule by mouth daily.    ondansetron (ZOFRAN-ODT) 4 MG disintegrating tablet Take 1 tablet (4 mg total) by mouth every 8 (eight) hours as needed for nausea or vomiting.    pantoprazole (PROTONIX) 40 MG tablet Take 1 tablet (40 mg total) by mouth daily before breakfast.    spironolactone (ALDACTONE) 25 MG tablet Take 1 tablet (25 mg total) by mouth daily.    thiamine (VITAMIN B1) 100 MG tablet Take 100 mg by mouth daily.    [DISCONTINUED] Oxycodone HCl 10 MG TABS Take 1 tablet (10 mg total) by mouth  4 (four) times daily as needed (modereate to severe pain).    Oxycodone HCl 10 MG TABS Take 1 tablet (10 mg total) by mouth 4 (four) times daily as needed for up to 3 days (modereate to severe pain).    No facility-administered encounter medications on file as of 01/20/2023.    Diagnostic Tests Reviewed/Disposition: labs, imaging from recent visit  Consults: interventional radiology   Discharge Instructions: Recommended to follow up with PCP and hepatology provider for scheduling paracentesis   Disease/illness Education:  Home Health/Community Services Discussions/Referrals:  Establishment or re-establishment of referral orders for community  resources: none  Discussion with other health care providers:none   Assessment and Support of treatment regimen adherence: Patient is aware he needs weekly paracentesis and is wanting this ordered. Reviewed that he will need to contact his Hepatology provider for this - reviewed previous notes/ responses from PCP regarding this   Appointments Coordinated with: none  Education for self-management, independent living, and ADLs:    Medications: Outpatient Medications Prior to Visit  Medication Sig   albuterol (PROVENTIL) (2.5 MG/3ML) 0.083% nebulizer solution Take 3 mLs (2.5 mg total) by nebulization every 4 (four) hours as needed for wheezing or shortness of breath.   albuterol (VENTOLIN HFA) 108 (90 Base) MCG/ACT inhaler Inhale 2 puffs into the lungs every 4 (four) hours as needed.   budesonide-formoterol (SYMBICORT) 80-4.5 MCG/ACT inhaler Take 2 puffs first thing in am and then another 2 puffs about 12 hours later.   Docusate Sodium (DSS) 100 MG CAPS TAKE 1 CAPSULE (100 MG TOTAL) BY MOUTH TWO (2) TIMES A DAY   EPINEPHrine (EPIPEN 2-PAK) 0.3 mg/0.3 mL IJ SOAJ injection Inject 0.3 mg into the muscle as needed for anaphylaxis.   famotidine (PEPCID) 20 MG tablet Take 1 tablet (20 mg total) by mouth daily after supper. One after supper   furosemide (LASIX) 20 MG tablet Take 40 mg by mouth daily.   Homeopathic Products (LIVER SUPPORT SL) Place under the tongue.   hydrOXYzine (VISTARIL) 25 MG capsule Take 1 capsule (25 mg total) by mouth every 8 (eight) hours as needed.   lactulose (CHRONULAC) 10 GM/15ML solution SMARTSIG:15 Milliliter(s) By Mouth Every Other Day   magnesium oxide (MAG-OX) 400 (240 Mg) MG tablet Take 2 tablets by mouth 2 (two) times daily.   Multiple Vitamin (MULTIVITAMIN) capsule Take 1 capsule by mouth daily.   ondansetron (ZOFRAN-ODT) 4 MG disintegrating tablet Take 1 tablet (4 mg total) by mouth every 8 (eight) hours as needed for nausea or vomiting.   pantoprazole (PROTONIX)  40 MG tablet Take 1 tablet (40 mg total) by mouth daily before breakfast.   spironolactone (ALDACTONE) 25 MG tablet Take 1 tablet (25 mg total) by mouth daily.   thiamine (VITAMIN B1) 100 MG tablet Take 100 mg by mouth daily.   [DISCONTINUED] Oxycodone HCl 10 MG TABS Take 1 tablet (10 mg total) by mouth 4 (four) times daily as needed (modereate to severe pain).   No facility-administered medications prior to visit.    Review of Systems  Respiratory:  Negative for shortness of breath and wheezing.   Gastrointestinal:  Negative for abdominal pain.         Objective    BP 126/71 (BP Location: Right Arm, Patient Position: Sitting, Cuff Size: Normal)   Pulse (!) 102   Temp (!) 96.1 F (35.6 C) (Temporal)   Wt 221 lb (100.2 kg) Comment: Per pt  SpO2 96%   BMI 29.97 kg/m  Physical Exam Vitals reviewed.  Constitutional:      General: He is awake.     Appearance: Normal appearance. He is well-developed and well-groomed.     Comments: Patient began vomiting during apt. Vomited several times with what appeared to be normal gastric contents. He reported feeling better after he had finished vomiting   HENT:     Head: Normocephalic.  Abdominal:     General: Abdomen is protuberant. There is distension.     Comments: Several paracentesis sites present along right lower abdomen with one showing mild clear drainage.    Neurological:     Mental Status: He is alert.  Psychiatric:        Attention and Perception: Attention normal.        Mood and Affect: Mood normal.        Speech: Speech normal.        Behavior: Behavior normal. Behavior is cooperative.       No results found for any visits on 01/20/23.  Assessment & Plan      No follow-ups on file.     Problem List Items Addressed This Visit       Digestive   Alcoholic cirrhosis of liver with ascites (HCC)    Chronic, historic condition, appears unstable at this time He was recently seen and hospitalized for ascites  and had 10.4 L removed He was instructed to have provider set up weekly paracentesis apts for management Reviewed that his PCP and other providers in office have stated they are not able/comfortable to place these orders and he will need to have this organized by his hepatology provider  Dr. Ruffin Frederick office information was provided in AVS Recommend prompt follow up with this specialty for adequate management  Follow up with PCP for monitoring and coordination of care       End stage liver disease [facility])   Other Visit Diagnoses     Hospital discharge follow-up    -  Primary See alcoholic cirrhosis of liver with ascites for A&P  Reviewed recent hospital dc follow up and reviewed instructions with him Recommend he follows up promptly with hepatology to set up weekly paracentesis    Painful orthopaedic hardware [facility])       Relevant Medications   Oxycodone HCl 10 MG TABS   Palliative care patient     3 day refill of Oxycodone provided per patient request PDMP reviewed, no evidence of worrisome controlled substance use patterns at this time     Relevant Medications   Oxycodone HCl 10 MG TABS   Chronic foot pain, right       Relevant Medications   Oxycodone HCl 10 MG TABS        No follow-ups on file.   I,  E , PA-C, have reviewed all documentation for this visit. The documentation on 01/20/23 for the exam, diagnosis, procedures, and orders are all accurate and complete.   Jacquelin Hawking, MHS, PA-C [facility]

## 2023-01-20 NOTE — Assessment & Plan Note (Deleted)
Chronic, historic condition, appears unstable at this time He was recently seen and hospitalized for ascites and had 10.4 L removed He was instructed to have provider set up weekly paracentesis apts for management Reviewed that his PCP and other providers in office have stated they are not able/comfortable to place these orders and he will need to have this organized by his hepatology provider  Dr. Ruffin Frederick office information was provided in AVS Recommend prompt follow up with this specialty for adequate management  Follow up with PCP for monitoring and coordination of care

## 2023-01-20 NOTE — Patient Instructions (Addendum)
Please contact Dr. Ruffin Frederick to have your paracentesis  scheduled. Your PCP office in not able to place these orders so it would need to come from specialty   Janyth Pupa, MD  [address]  [address] [place], [place] [address]  Phone: [phone]  Fax: [phone]

## 2023-01-23 ENCOUNTER — Other Ambulatory Visit: Payer: Self-pay | Admitting: Family Medicine

## 2023-01-23 DIAGNOSIS — G8929 Other chronic pain: Secondary | ICD-10-CM

## 2023-01-23 DIAGNOSIS — Z515 Encounter for palliative care: Secondary | ICD-10-CM

## 2023-01-23 DIAGNOSIS — T8484XA Pain due to internal orthopedic prosthetic devices, implants and grafts, initial encounter: Secondary | ICD-10-CM

## 2023-01-23 NOTE — Telephone Encounter (Signed)
Medication Refill - Medication:  albuterol (VENTOLIN HFA) 108 (90 Base) MCG/ACT inhaler Oxycodone HCl 10 MG TABS    Has the patient contacted their pharmacy? No.  Preferred Pharmacy (with phone number or street name):   [id number] [place], [place] - [address]  Phone: [phone] Fax: [phone]   Has the patient been seen for an appointment in the last year OR does the patient have an upcoming appointment? Yes.    Agent: Please be advised that RX refills may take up to 3 business days. We ask that you follow-up with your pharmacy.

## 2023-01-24 ENCOUNTER — Other Ambulatory Visit: Payer: Self-pay | Admitting: Internal Medicine

## 2023-01-24 ENCOUNTER — Telehealth: Payer: Self-pay

## 2023-01-24 ENCOUNTER — Emergency Department
Admission: EM | Admit: 2023-01-24 | Discharge: 2023-01-24 | Discharge status: Against Medical Advice | Payer: Medicaid Other | Attending: Emergency Medicine | Admitting: Emergency Medicine

## 2023-01-24 ENCOUNTER — Other Ambulatory Visit: Payer: Self-pay

## 2023-01-24 DIAGNOSIS — R140 Abdominal distension (gaseous): Secondary | ICD-10-CM | POA: Insufficient documentation

## 2023-01-24 DIAGNOSIS — Z5321 Procedure and treatment not carried out due to patient leaving prior to being seen by health care provider: Secondary | ICD-10-CM | POA: Diagnosis not present

## 2023-01-24 DIAGNOSIS — M79604 Pain in right leg: Secondary | ICD-10-CM | POA: Insufficient documentation

## 2023-01-24 DIAGNOSIS — R109 Unspecified abdominal pain: Secondary | ICD-10-CM | POA: Insufficient documentation

## 2023-01-24 DIAGNOSIS — M25571 Pain in right ankle and joints of right foot: Secondary | ICD-10-CM | POA: Diagnosis not present

## 2023-01-24 DIAGNOSIS — M79673 Pain in unspecified foot: Secondary | ICD-10-CM | POA: Diagnosis not present

## 2023-01-24 LAB — CBC
HCT: 31 % — ABNORMAL LOW (ref 39.0–52.0)
Hemoglobin: 10.1 g/dL — ABNORMAL LOW (ref 13.0–17.0)
MCH: 27.7 pg (ref 26.0–34.0)
MCHC: 32.6 g/dL (ref 30.0–36.0)
MCV: 85.2 fL (ref 80.0–100.0)
Platelets: 219 10*3/uL (ref 150–400)
RBC: 3.64 MIL/uL — ABNORMAL LOW (ref 4.22–5.81)
RDW: 16.3 % — ABNORMAL HIGH (ref 11.5–15.5)
WBC: 10.6 10*3/uL — ABNORMAL HIGH (ref 4.0–10.5)
nRBC: 0 % (ref 0.0–0.2)

## 2023-01-24 LAB — COMPREHENSIVE METABOLIC PANEL
ALT: 21 U/L (ref 0–44)
AST: 42 U/L — ABNORMAL HIGH (ref 15–41)
Albumin: 1.9 g/dL — ABNORMAL LOW (ref 3.5–5.0)
Alkaline Phosphatase: 83 U/L (ref 38–126)
Anion gap: 9 (ref 5–15)
BUN: 25 mg/dL — ABNORMAL HIGH (ref 6–20)
CO2: 17 mmol/L — ABNORMAL LOW (ref 22–32)
Calcium: 8 mg/dL — ABNORMAL LOW (ref 8.9–10.3)
Chloride: 105 mmol/L (ref 98–111)
Creatinine, Ser: 1.76 mg/dL — ABNORMAL HIGH (ref 0.61–1.24)
GFR, Estimated: 44 mL/min — ABNORMAL LOW (ref >60)
Glucose, Bld: 173 mg/dL — ABNORMAL HIGH (ref 70–99)
Potassium: 3.9 mmol/L (ref 3.5–5.1)
Sodium: 131 mmol/L — ABNORMAL LOW (ref 135–145)
Total Bilirubin: 1.3 mg/dL — ABNORMAL HIGH (ref 0.3–1.2)
Total Protein: 5.8 g/dL — ABNORMAL LOW (ref 6.5–8.1)

## 2023-01-24 LAB — LIPASE, BLOOD: Lipase: 25 U/L (ref 11–51)

## 2023-01-24 MED ORDER — ALBUTEROL SULFATE HFA 108 (90 BASE) MCG/ACT IN AERS: 2.0000 | INHALATION_SPRAY | RESPIRATORY_TRACT | 0 refills | Status: DC | PRN

## 2023-01-24 MED ORDER — OXYCODONE HCL 10 MG PO TABS: 10.0000 mg | ORAL_TABLET | Freq: Four times a day (QID) | ORAL | 0 refills | Status: DC | PRN

## 2023-01-24 NOTE — Telephone Encounter (Signed)
Requested medication (s) are due for refill today: routing for review  Requested medication (s) are on the active medication list: yes  Last refill:  01/24/23 and 12/27/22  Future visit scheduled: no  Notes to clinic:  Albuterol last refilled by another provider not at this practice, routing for approval. Unable to refuse Oxycodone, routing for refusal.     Requested Prescriptions  Pending Prescriptions Disp Refills   albuterol (VENTOLIN HFA) 108 (90 Base) MCG/ACT inhaler      Sig: Inhale 2 puffs into the lungs every 4 (four) hours as needed.     Pulmonology:  Beta Agonists 2 Passed - 01/23/2023  2:00 PM      Passed - Last BP in normal range    BP Readings from Last 1 Encounters:  01/20/23 126/71         Passed - Last Heart Rate in normal range    Pulse Readings from Last 1 Encounters:  01/20/23 (!) 102         Passed - Valid encounter within last 12 months    Recent Outpatient Visits           4 days ago Hospital discharge follow-up   [place] [facility] Mecum, Oswaldo Conroy, [place]   1 month ago No-show for appointment   [facility] [name], DO   2 months ago Chronic foot pain, right   [place] [facility] [place], [name], DO   3 months ago Alcoholic cirrhosis of liver with ascites [facility])   [facility] [place], [name], DO   3 months ago Alcoholic cirrhosis of liver with ascites [facility])   [facility], [name], DO               Oxycodone HCl 10 MG TABS 12 tablet 0    Sig: Take 1 tablet (10 mg total) by mouth 4 (four) times daily as needed for up to 3 days (modereate to severe pain).     Not Delegated - Analgesics:  Opioid Agonists Failed - 01/23/2023  2:00 PM      Failed - This refill cannot be delegated      Failed - Urine Drug Screen completed in last 360 days      Passed - Valid encounter within last 3  months    Recent Outpatient Visits           4 days ago Hospital discharge follow-up   [place] [facility] M[facility]um, Oswaldo Conroy, PA-C   1 month ago No-show for appointment   [place] [facility] [name], DO   2 months ago Chronic foot pain, right   [place] [facility] [place], [name], DO   3 months ago Alcoholic cirrhosis of liver with ascites [facility])   [facility] [facility] [place], [name], DO   3 months ago Alcoholic cirrhosis of liver with ascites [facility])    [facility], [name], [place]

## 2023-01-24 NOTE — Telephone Encounter (Signed)
Dr. Althea Charon is out of the office until the 14th.  I am unable to advise on paracentesis order for this patient.

## 2023-01-24 NOTE — ED Triage Notes (Signed)
Pt c/o right leg pain and abdominal distention. Pt c/o not being able to fill his pain medication for his right leg and is also needing a paracentesis.

## 2023-01-24 NOTE — Telephone Encounter (Signed)
refilled 

## 2023-01-24 NOTE — Telephone Encounter (Signed)
Copied from CRM [id number]. Topic: General - Other >> Jan 24, 2023  1:18 PM Phill Myron wrote: Tom [name] called and stated that his pharmacy   [phone] [place], [place] - [address] [place] [address] Phone: [phone] Fax: [phone]   is refusing to give him his Oxycodone HCl 10 MG TABS because Dr Althea Charon did not sign it.   Please advise. Thank you

## 2023-01-24 NOTE — Telephone Encounter (Signed)
I called and spoke with Silvestre Moment, RN at [facility].  I reported that Dr. Althea Charon is not managing this medical condition. Lanora Manis stated she was going to update patients chart and notify patient.

## 2023-01-24 NOTE — Telephone Encounter (Signed)
Copied from CRM [id number]. Topic: General - Inquiry >> Jan 24, 2023 10:38 AM Clide Dales wrote: Silvestre Moment, RN with [facility] called to get a status on the paracentesis orders for patient. Patient is needing a paracentesis now and patient's discharge papers said that Dr. Kirtland Bouchard agreed to put in orders to have one done once a week for four weeks. Please advise. [phone]

## 2023-01-24 NOTE — Telephone Encounter (Signed)
Copied from CRM [id number]. Topic: General - Other >> Jan 24, 2023  9:17 AM Everette C wrote: Reason for CRM: The patient has called to follow up on the status of their previously submitted refill request of Oxycodone HCl 10 MG TABS   Please contact further when possible

## 2023-01-25 DIAGNOSIS — M869 Osteomyelitis, unspecified: Secondary | ICD-10-CM | POA: Diagnosis not present

## 2023-01-27 ENCOUNTER — Other Ambulatory Visit: Payer: Self-pay | Admitting: Gastroenterology

## 2023-01-27 DIAGNOSIS — K7031 Alcoholic cirrhosis of liver with ascites: Secondary | ICD-10-CM

## 2023-01-30 ENCOUNTER — Ambulatory Visit
Admission: RE | Admit: 2023-01-30 | Discharge: 2023-01-30 | Disposition: A | Discharge status: Home / Self Care | Payer: Medicaid Other | Source: Ambulatory Visit | Attending: Gastroenterology | Admitting: Gastroenterology

## 2023-01-30 ENCOUNTER — Other Ambulatory Visit: Payer: Medicaid Other | Admitting: *Deleted

## 2023-01-30 ENCOUNTER — Other Ambulatory Visit: Payer: Medicaid Other

## 2023-01-30 DIAGNOSIS — K7031 Alcoholic cirrhosis of liver with ascites: Secondary | ICD-10-CM | POA: Diagnosis not present

## 2023-01-30 MED ORDER — LIDOCAINE HCL (PF) 1 % IJ SOLN
10.0000 mL | Freq: Once | INTRAMUSCULAR | Status: AC
  Administered 2023-01-30: 10 mL via SUBCUTANEOUS
  Filled 2023-01-30: qty 10

## 2023-01-30 MED ORDER — ALBUMIN HUMAN 25 % IV SOLN
25.0000 g | Freq: Once | INTRAVENOUS | Status: AC
  Administered 2023-01-30: 25 g via INTRAVENOUS

## 2023-01-30 MED ORDER — ALBUMIN HUMAN 25 % IV SOLN
INTRAVENOUS | Status: AC
  Filled 2023-01-30: qty 200

## 2023-01-30 NOTE — Patient Outreach (Signed)
Care Coordination  01/30/2023  D[facility] [phone]  Successful outreach with Mr. Mayton today. However he is having a paracentesis currently and request to reschedule. A new telephone outreach was scheduled for 02/01/23 at 2:30pm. Patient agreed to new date and time.  [name] RN, BSN [place]  Managed [facility] RN Care Coordinator [phone]

## 2023-01-30 NOTE — Patient Instructions (Signed)
Visit Information  Tom [name] was given information about Medicaid Managed Care team care coordination services as a part of their Healthy [employer] Medicaid benefit. Tom [name] verbally consented to engagement with the [facility] Managed Care team.   If you are experiencing a medical emergency, please call 911 or report to your local emergency department or urgent care.   If you have a non-emergency medical problem during routine business hours, please contact your provider's office and ask to speak with a nurse.   For questions related to your Healthy [facility] health plan, please call: [phone] or visit the homepage here: [email or web address]  If you would like to schedule transportation through your Healthy [facility] plan, please call the following number at least 2 days in advance of your appointment: [phone]  For information about your ride after you set it up, call Ride Assist at [phone]. Use this number to activate a Will Call pickup, or if your transportation is late for a scheduled pickup. Use this number, too, if you need to make a change or cancel a previously scheduled reservation.  If you need transportation services right away, call [phone]. The after-hours call center is staffed 24 hours to handle ride assistance and urgent reservation requests (including discharges) 365 days a year. Urgent trips include sick visits, hospital discharge requests and life-sustaining treatment.  Call the [facility] Line at [phone], at any time, 24 hours a day, 7 days a week. If you are in danger or need immediate medical attention call 911.  If you would like help to quit smoking, call 1-800-QUIT-NOW (256-844-6329) OR Espaol: 1-855-Djelo-Ya (7-564-332-9518) o para ms informacin haga clic aqu or Text READY to 841-660 to register via text  Mr. Holsclaw - following are the goals we discussed in your  visit today:   Goals Addressed   None      Social Worker will follow up in 30 days.   Tom [name], Tom [name], MHA [facility]  Managed Medicaid Social Worker [phone]   Following is a copy of your plan of care:  There are no care plans that you recently modified to display for this patient.

## 2023-01-30 NOTE — Procedures (Signed)
PROCEDURE SUMMARY:  Successful ultrasound guided paracentesis from the right lower quadrant.  Yielded 8 L of straw colored fluid.  No immediate complications.  The patient tolerated the procedure well.   EBL < 2mL  The patient has required >/=2 paracenteses in a 30 day period and a screening evaluation by the [facility] has been arranged.

## 2023-01-30 NOTE — Patient Outreach (Signed)
Medicaid Managed Care Social Work Note  01/30/2023 Name:  Tom [name] MRN:  [id number] DOB:  Oct 28, 1965  Tom [name] is an 57 y.o. year old male who is a primary patient of [name], DO.  The [facility] Managed Care Coordination team was consulted for assistance with:  Food Insecurity  Tom [name] was given information about Medicaid Managed Care Coordination team services today. Tom [name] Patient agreed to services and verbal consent obtained.  Engaged with patient  for by telephone forfollow up visit in response to referral for case management and/or care coordination services.   Assessments/Interventions:  Review of past medical history, allergies, medications, health status, including review of consultants reports, laboratory and other test data, was performed as part of comprehensive evaluation and provision of chronic care management services.  SDOH: (Social Determinant of Health) assessments and interventions performed: SDOH Interventions    Flowsheet Row Telephone from 01/11/2023 in [place] POPULATION HEALTH DEPARTMENT ED to Hosp-Admission (Discharged) from 12/26/2022 in [facility] ED to Hosp-Admission (Discharged) from 12/15/2022 in [facility] Patient Outreach Telephone from 10/19/2022 in [place] POPULATION HEALTH DEPARTMENT Telephone from 09/28/2022 in [place] POPULATION HEALTH DEPARTMENT  SDOH Interventions       Transportation Interventions Intervention Not Indicated Inpatient TOC, Other (Comment) Inpatient TOC -- Intervention Not Indicated  Utilities Interventions -- -- -- Intervention Not Indicated --      BSW completed a telephone outreach with patient. He states he did not receive resources BSW sent to him. BSW will resend resources. No additional resources are needed at this time.  Advanced Directives Status:  Not addressed in this  encounter.  Care Plan                 Allergies  Allergen Reactions   Alfuzosin Other (See Comments)    Dizziness    Amlodipine Nausea And Vomiting   Bee Venom Swelling   Cymbalta [Duloxetine Hcl] Other (See Comments)    Emesis    Duloxetine Other (See Comments)    Emesis   Lisinopril    Flomax [Tamsulosin Hcl] Itching    Medications Reviewed Today   Medications were not reviewed in this encounter     Patient Active Problem List   Diagnosis Date Noted   Abdominal pain 01/16/2023   Anxiety 01/16/2023   Chronic kidney disease, stage 3a (HCC) 01/16/2023   Cellulitis of right lower extremity 12/31/2022   Obesity (BMI 30-39.9) 12/27/2022   Cellulitis of right foot 12/26/2022   Hyponatremia 12/17/2022   End stage liver disease (HCC) 12/15/2022   Dyslipidemia 12/04/2022   COPD with asthma 12/04/2022   GERD without esophagitis 12/04/2022   Abdominal distension 12/04/2022   Shortness of breath 12/04/2022   Ascites 12/03/2022   Right foot pain 11/15/2022   AKI (acute kidney injury) (HCC) 11/15/2022   Elevated lactic acid level 11/15/2022   Complication associated with orthopedic device (HCC) 10/21/2022   Alcoholic cirrhosis of liver with ascites (HCC) 07/04/2022   Former smoker 09/16/2021   Asthmatic bronchitis , chronic 09/16/2021   Pre-diabetes 08/13/2021   Morbid obesity due to excess calories (HCC) 08/13/2021   DOE (dyspnea on exertion) 04/13/2021   Acute pain of left wrist 03/25/2021   DDD (degenerative disc disease), lumbar 12/16/2020   Chronic back pain 12/16/2020   Arthritis of right ankle 05/08/2017   Atherosclerosis of native coronary artery of native heart without angina pectoris 01/06/2015   Essential hypertension 10/03/2012  Osteoarthritis 12/06/2011    Conditions to be addressed/monitored per PCP order:   food resources  There are no care plans that you recently modified to display for this patient.   Follow up:  Patient agrees to Care Plan and  Follow-up.  Plan: The Managed Medicaid care management team will reach out to the patient again over the next 30 days.  Date/time of next scheduled Social Work care management/care coordination outreach:  03/02/23  Gus Puma, Kenard Gower, [facility]  Managed [facility] Social Worker [phone]

## 2023-01-31 ENCOUNTER — Inpatient Hospital Stay: Payer: Medicaid Other | Admitting: Family Medicine

## 2023-02-01 ENCOUNTER — Other Ambulatory Visit: Payer: Self-pay | Admitting: Family Medicine

## 2023-02-01 ENCOUNTER — Encounter: Payer: Self-pay | Admitting: *Deleted

## 2023-02-01 ENCOUNTER — Other Ambulatory Visit: Payer: Medicaid Other | Admitting: *Deleted

## 2023-02-01 DIAGNOSIS — K7031 Alcoholic cirrhosis of liver with ascites: Secondary | ICD-10-CM

## 2023-02-01 NOTE — Patient Outreach (Addendum)
Medicaid Managed Care   Nurse Care Manager Note  02/01/2023 Name:  Tom [name] MRN:  [id number] DOB:  16-Jan-1966  Tom [name] is an 57 y.o. year old male who is a primary patient of [name], DO.  [facility] Managed Care Coordination team was consulted for assistance with:    Liver Cirrhosis  [name] was given information about Medicaid Managed Care Coordination team services today. Tom [name] Patient agreed to services and verbal consent obtained.  Engaged with patient by telephone for follow up visit in response to provider referral for case management and/or care coordination services.   Assessments/Interventions:  Review of past medical history, allergies, medications, health status, including review of consultants reports, laboratory and other test data, was performed as part of comprehensive evaluation and provision of chronic care management services.  SDOH (Social Determinants of Health) assessments and interventions performed: SDOH Interventions    Flowsheet Row Telephone from 01/11/2023 in [place] [facility] DEPARTMENT ED to Hosp-Admission (Discharged) from 12/26/2022 in [facility] ED to Hosp-Admission (Discharged) from 12/15/2022 in [facility] Patient Outreach Telephone from 10/19/2022 in [place] POP[facility] DEPARTMENT Telephone from 09/28/2022 in  POPULATION HEALTH DEPARTMENT  SDOH Interventions       Transportation Interventions Intervention Not Indicated Inpatient TOC, Other (Comment) Inpatient TOC -- Intervention Not Indicated  Utilities Interventions -- -- -- Intervention Not Indicated --       Care Plan  Allergies  Allergen Reactions   Alfuzosin Other (See Comments)    Dizziness    Amlodipine Nausea And Vomiting   Bee Venom Swelling   Cymbalta [Duloxetine Hcl] Other (See Comments)    Emesis    Duloxetine  Other (See Comments)    Emesis   Lisinopril    Flomax [Tamsulosin Hcl] Itching    Medications Reviewed Today     Reviewed by [name], RN (Registered Nurse) on 02/01/23 at 1430  Med List Status: <None>   Medication Order Taking? Sig Documenting Provider Last Dose Status Informant  albuterol (PROVENTIL) (2.5 MG/3ML) 0.083% nebulizer solution [id number] Yes Take 3 mLs (2.5 mg total) by nebulization every 4 (four) hours as needed for wheezing or shortness of breath. [name], MD Taking Active Spouse/Significant Other  albuterol (VENTOLIN HFA) 108 (90 Base) MCG/ACT inhaler [id number] Yes Inhale 2 puffs into the lungs every 4 (four) hours as needed. [name], NP Taking Active   budesonide-formoterol [facility]) 80-4.5 MCG/ACT inhaler [id number] Yes Take 2 puffs first thing in am and then another 2 puffs about 12 hours later. [name], DO Taking Active Spouse/Significant Other  Docusate Sodium (DSS) 100 MG CAPS [id number] No TAKE 1 CAPSULE (100 MG TOTAL) BY MOUTH TWO (2) TIMES A DAY  Patient not taking: Reported on 02/01/2023   [provider] Not Taking Active Spouse/Significant Other  EPINEPHrine (EPIPEN 2-PAK) 0.3 mg/0.3 mL IJ SOAJ injection [id number]  Inject 0.3 mg into the muscle as needed for anaphylaxis. [name], DO  Active Spouse/Significant Other  famotidine (PEPCID) 20 MG tablet [id number] Yes Take 1 tablet (20 mg total) by mouth daily after supper. One after supper [name], DO Taking Active Spouse/Significant Other  furosemide (LASIX) 20 MG tablet [id number] Yes Take 40 mg by mouth daily. [provider] Taking Active Spouse/Significant Other           Med Note Orvan Falconer Jan 16, 2023 11:10  AM)    Homeopathic Products (LIVER SUPPORT SL) [id number] No Place under the tongue.  Patient not taking: Reported on 02/01/2023   [provider] Not Taking Active Spouse/Significant Other   hydrOXYzine (VISTARIL) 25 MG capsule [id number] Yes Take 1 capsule (25 mg total) by mouth every 8 (eight) hours as needed. [name], DO Taking Active Spouse/Significant Other  lactulose (CHRONULAC) 10 GM/15ML solution [id number] Yes SMARTSIG:15 Milliliter(s) By Mouth Every Other Day [name], DO Taking Active Spouse/Significant Other           Med Note [name]   Tue Dec 27, 2022  2:03 AM) Patient takes every day.  magnesium oxide (MAG-OX) 400 (240 Mg) MG tablet [id number]  Take 2 tablets by mouth 2 (two) times daily. [provider]  Active Spouse/Significant Other  Multiple Vitamin (MULTIVITAMIN) capsule [id number] Yes Take 1 capsule by mouth daily. [provider] Taking Active Spouse/Significant Other  ondansetron (ZOFRAN-ODT) 4 MG disintegrating tablet [id number] Yes Take 1 tablet (4 mg total) by mouth every 8 (eight) hours as needed for nausea or vomiting. [name], DO Taking Active Spouse/Significant Other  Oxycodone HCl 10 MG TABS [id number] Yes Take 1 tablet (10 mg total) by mouth every 6 (six) hours as needed. [name], NP Taking Active            Med Note (,  A   Wed Feb 01, 2023  2:29 PM) Taking every 4 hours  pantoprazole (PROTONIX) 40 MG tablet [id number] Yes Take 1 tablet (40 mg total) by mouth daily before breakfast. [name], DO Taking Active Spouse/Significant Other  spironolactone (ALDACTONE) 25 MG tablet [id number] No Take 1 tablet (25 mg total) by mouth daily.  Patient not taking: Reported on 02/01/2023   [name], DO Not Taking Active Spouse/Significant Other  thiamine (VITAMIN B1) 100 MG tablet [id number] Yes Take 100 mg by mouth daily. 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   GERD without esophagitis 12/04/2022   Abdominal distension 12/04/2022   Shortness of breath 12/04/2022   Ascites 12/03/2022   Right foot pain 11/15/2022   AKI (acute kidney injury) (HCC) 11/15/2022   Elevated lactic acid level 11/15/2022   Complication associated with orthopedic device (HCC) 10/21/2022   Alcoholic cirrhosis of liver with ascites (HCC) 07/04/2022   Former smoker 09/16/2021   Asthmatic bronchitis , chronic 09/16/2021   Pre-diabetes 08/13/2021   Morbid obesity due to excess calories (HCC) 08/13/2021   DOE (dyspnea on exertion) 04/13/2021   Acute pain of left wrist 03/25/2021   DDD (degenerative disc disease), lumbar 12/16/2020   Chronic back pain 12/16/2020   Arthritis of right ankle 05/08/2017   Atherosclerosis of native coronary artery of native heart without angina pectoris 01/06/2015   Essential hypertension 10/03/2012   Osteoarthritis 12/06/2011    Conditions to be addressed/monitored per PCP order:   Liver Cirrhosis  Care Plan : RN Care Manager Plan of Care  Updates made by [name], RN since 02/01/2023 12:00  AM     Problem: Health Managment needs related to Liver Cirrhosis      Long-Range Goal: Development of Plan of Care to address Health Managment needs related to Liver Cirrhosis   Start Date: 02/01/2023  Expected End Date: 05/02/2023  Note:   Current Barriers:  Knowledge Deficits related to plan of care for management of Liver Cirrohsis Mr. Cabler continues to manage his health with the help of his spouse. He wants to remain independent. He denies any needs today.   RNCM Clinical Goal(s):  Patient will verbalize understanding of plan for management of Liver Cirrhosis as  evidenced by patient reports attend all scheduled medical appointments: 02/27/23 with Hepatology and schedule follow up with PCP as evidenced by provider documentation in EMR        continue to work with RN Care Manager and/or Social Worker to address care management and care coordination needs related to Liver Cirrhosis as evidenced by adherence to CM Team Scheduled appointments     through collaboration with RN Care manager, provider, and care team.   Interventions: Evaluation of current treatment plan related to  self management and patient's adherence to plan as established by provider   Liver Cirrhosis  (Status: New goal.) Long Term Goal  Evaluation of current treatment plan related to  Liver Cirrhosis ,  self-management and patient's adherence to plan as established by provider. Discussed plans with patient for ongoing care management follow up and provided patient with direct contact information for care management team Advised patient to Schedule follow up with PCP to discuss pain management; Reviewed medications with patient and discussed Oxycodone and the importance of taking as directed; Assessed social determinant of health barriers;  Advised patient to discuss any concerns or questions regarding liver transplant with Hepatology  Advised patient to take all medications to next PCP appointment for reconciliation  Patient Goals/Self-Care Activities: Take medications as prescribed   Attend all scheduled provider appointments Call provider office for new concerns or questions        Follow Up:  Patient agrees to Care Plan and Follow-up.  Plan: The Managed Medicaid care management team will reach out to the patient again over the next 7 days.  Date/time of next scheduled RN care management/care coordination outreach:  02/09/23 @ 1:15pm  [name] RN, BSN [place]  Managed [facility] RN Care Coordinator [phone]

## 2023-02-01 NOTE — Patient Instructions (Signed)
Visit Information  [name] was given information about Medicaid Managed Care team care coordination services as a part of their Healthy [employer] Medicaid benefit. Tom [name] verbally consented to engagement with the [facility] Managed Care team.   If you are experiencing a medical emergency, please call 911 or report to your local emergency department or urgent care.   If you have a non-emergency medical problem during routine business hours, please contact your provider's office and ask to speak with a nurse.   For questions related to your Healthy [facility] health plan, please call: [phone] or visit the homepage here: [email or web address]  If you would like to schedule transportation through your Healthy [facility] plan, please call the following number at least 2 days in advance of your appointment: [phone]  For information about your ride after you set it up, call Ride Assist at [phone]. Use this number to activate a Will Call pickup, or if your transportation is late for a scheduled pickup. Use this number, too, if you need to make a change or cancel a previously scheduled reservation.  If you need transportation services right away, call [phone]. The after-hours call center is staffed 24 hours to handle ride assistance and urgent reservation requests (including discharges) 365 days a year. Urgent trips include sick visits, hospital discharge requests and life-sustaining treatment.  Call the [facility] Line at [phone], at any time, 24 hours a day, 7 days a week. If you are in danger or need immediate medical attention call 911.  If you would like help to quit smoking, call 1-800-QUIT-NOW (907-070-3822) OR Espaol: 1-855-Djelo-Ya (7-106-269-4854) o para ms informacin haga clic aqu or Text READY to 627-035 to register via text  [name],   Please see education materials related to  ascites provided by MyChart link.  Patient verbalizes understanding of instructions and care plan provided today and agrees to view in MyChart. Active MyChart status and patient understanding of how to access instructions and care plan via MyChart confirmed with patient.     Telephone follow up appointment with Managed Medicaid care management team member scheduled for:02/09/23 @ 1:15pm  [name] RN, BSN [place]  Managed [facility] RN Care Coordinator [phone]   Following is a copy of your plan of care:  Care Plan : RN Care Manager Plan of Care  Updates made by [name], RN since 02/01/2023 12:00 AM     Problem: Health Managment needs related to Liver Cirrhosis      Long-Range Goal: Development of Plan of Care to address Health Managment needs related to Liver Cirrhosis   Start Date: 02/01/2023  Expected End Date: 05/02/2023  Note:   Current Barriers:  Knowledge Deficits related to plan of care for management of Liver Cirrohsis [name] continues to manage his health with the help of his spouse. He wants to remain independent. He denies any needs today.   RNCM Clinical Goal(s):  Patient will verbalize understanding of plan for management of Liver Cirrhosis as evidenced by patient reports attend all scheduled medical appointments: 02/27/23 with Hepatology and schedule follow up with PCP as evidenced by provider documentation in EMR        continue to work with RN Care Manager and/or Social Worker to address care management and care coordination needs related to Liver Cirrhosis as evidenced by adherence to CM Team Scheduled appointments     through collaboration with RN Care manager, provider, and care team.   Interventions: Evaluation of current treatment  plan related to  self management and patient's adherence to plan as established by provider   Liver Cirrhosis  (Status: New goal.) Long Term Goal  Evaluation of current treatment plan related to  Liver  Cirrhosis ,  self-management and patient's adherence to plan as established by provider. Discussed plans with patient for ongoing care management follow up and provided patient with direct contact information for care management team Advised patient to Schedule follow up with PCP to discuss pain management; Reviewed medications with patient and discussed Oxycodone and the importance of taking as directed; Assessed social determinant of health barriers;  Advised patient to discuss any concerns or questions regarding liver transplant with Hepatology   Patient Goals/Self-Care Activities: Take medications as prescribed   Attend all scheduled provider appointments Call provider office for new concerns or questions

## 2023-02-01 NOTE — Telephone Encounter (Signed)
Medication Refill - Medication: lactulose (CHRONULAC) 10 GM/15ML solution   Has the patient contacted their pharmacy? No.   Preferred Pharmacy (with phone number or street name):  [id number] [place], [place] - [address] Phone: [phone]  Fax: [phone]     Has the patient been seen for an appointment in the last year OR does the patient have an upcoming appointment? Yes.    Agent: Please be advised that RX refills may take up to 3 business days. We ask that you follow-up with your pharmacy.

## 2023-02-02 NOTE — Telephone Encounter (Signed)
Rx 10GM/92ml  5RF- too soon Requested Prescriptions  Pending Prescriptions Disp Refills   lactulose (CHRONULAC) 10 GM/15ML solution 236 mL 5    Sig: SMARTSIG:15 Milliliter(s) By Mouth Every Other Day     Gastroenterology:  Laxatives - lactulose Failed - 02/01/2023 12:12 PM      Failed - CO2 in normal range and within 360 days    CO2  Date Value Ref Range Status  01/24/2023 17 (L) 22 - 32 mmol/L Final         Failed - Na in normal range and within 360 days    Sodium  Date Value Ref Range Status  01/24/2023 131 (L) 135 - 145 mmol/L Final         Passed - Cl in normal range and within 360 days    Chloride  Date Value Ref Range Status  01/24/2023 105 98 - 111 mmol/L Final         Passed - K in normal range and within 360 days    Potassium  Date Value Ref Range Status  01/24/2023 3.9 3.5 - 5.1 mmol/L Final         Passed - Valid encounter within last 12 months    Recent Outpatient Visits           1 week ago Hospital discharge follow-up   [place] [facility] Mecum, Oswaldo Conroy, PA-C   1 month ago No-show for appointment   [facility] [name], DO   2 months ago Chronic foot pain, right   [place] [facility] [place], [name], DO   3 months ago Alcoholic cirrhosis of liver with ascites [facility])   [place] [facility] [place], [name], DO   3 months ago Alcoholic cirrhosis of liver with ascites [facility])   [place] [facility] Althea Charon, [name], DO       Future Appointments             In 2 weeks Althea Charon, [name], DO [place] [facility]

## 2023-02-03 ENCOUNTER — Telehealth: Payer: Self-pay

## 2023-02-03 ENCOUNTER — Other Ambulatory Visit: Payer: Self-pay

## 2023-02-03 ENCOUNTER — Emergency Department: Payer: Medicaid Other

## 2023-02-03 ENCOUNTER — Emergency Department
Admission: EM | Admit: 2023-02-03 | Discharge: 2023-02-03 | Disposition: A | Discharge status: Home / Self Care | Payer: Medicaid Other | Attending: Emergency Medicine | Admitting: Emergency Medicine

## 2023-02-03 ENCOUNTER — Other Ambulatory Visit: Payer: Self-pay | Admitting: Family Medicine

## 2023-02-03 DIAGNOSIS — R0600 Dyspnea, unspecified: Secondary | ICD-10-CM | POA: Diagnosis not present

## 2023-02-03 DIAGNOSIS — I10 Essential (primary) hypertension: Secondary | ICD-10-CM | POA: Insufficient documentation

## 2023-02-03 DIAGNOSIS — J449 Chronic obstructive pulmonary disease, unspecified: Secondary | ICD-10-CM | POA: Diagnosis not present

## 2023-02-03 DIAGNOSIS — R918 Other nonspecific abnormal finding of lung field: Secondary | ICD-10-CM | POA: Diagnosis not present

## 2023-02-03 DIAGNOSIS — K7031 Alcoholic cirrhosis of liver with ascites: Secondary | ICD-10-CM

## 2023-02-03 DIAGNOSIS — R0602 Shortness of breath: Secondary | ICD-10-CM

## 2023-02-03 DIAGNOSIS — J9811 Atelectasis: Secondary | ICD-10-CM | POA: Diagnosis not present

## 2023-02-03 DIAGNOSIS — S42001A Fracture of unspecified part of right clavicle, initial encounter for closed fracture: Secondary | ICD-10-CM | POA: Diagnosis not present

## 2023-02-03 DIAGNOSIS — R188 Other ascites: Secondary | ICD-10-CM | POA: Insufficient documentation

## 2023-02-03 LAB — CBC
HCT: 30.3 % — ABNORMAL LOW (ref 39.0–52.0)
Hemoglobin: 10.1 g/dL — ABNORMAL LOW (ref 13.0–17.0)
MCH: 27.4 pg (ref 26.0–34.0)
MCHC: 33.3 g/dL (ref 30.0–36.0)
MCV: 82.1 fL (ref 80.0–100.0)
Platelets: 166 10*3/uL (ref 150–400)
RBC: 3.69 MIL/uL — ABNORMAL LOW (ref 4.22–5.81)
RDW: 15.9 % — ABNORMAL HIGH (ref 11.5–15.5)
WBC: 8 10*3/uL (ref 4.0–10.5)
nRBC: 0 % (ref 0.0–0.2)

## 2023-02-03 LAB — BASIC METABOLIC PANEL
Anion gap: 6 (ref 5–15)
BUN: 22 mg/dL — ABNORMAL HIGH (ref 6–20)
CO2: 21 mmol/L — ABNORMAL LOW (ref 22–32)
Calcium: 8.1 mg/dL — ABNORMAL LOW (ref 8.9–10.3)
Chloride: 107 mmol/L (ref 98–111)
Creatinine, Ser: 1.67 mg/dL — ABNORMAL HIGH (ref 0.61–1.24)
GFR, Estimated: 47 mL/min — ABNORMAL LOW (ref >60)
Glucose, Bld: 155 mg/dL — ABNORMAL HIGH (ref 70–99)
Potassium: 4.2 mmol/L (ref 3.5–5.1)
Sodium: 134 mmol/L — ABNORMAL LOW (ref 135–145)

## 2023-02-03 LAB — AMMONIA: Ammonia: 18 umol/L (ref 9–35)

## 2023-02-03 MED ORDER — LACTULOSE 10 GM/15ML PO SOLN: 10.0000 g | Freq: Every day | ORAL | 0 refills | Status: DC

## 2023-02-03 MED ORDER — OXYCODONE HCL 5 MG PO TABS
10.0000 mg | ORAL_TABLET | Freq: Once | ORAL | Status: AC
  Administered 2023-02-03: 10 mg via ORAL
  Filled 2023-02-03: qty 2

## 2023-02-03 NOTE — ED Notes (Signed)
Pt was discharged by other nurse

## 2023-02-03 NOTE — Telephone Encounter (Signed)
Medication Refill - Medication:  lactulose (CHRONULAC) 10 GM/15ML solution  Needs this medication wrote for once everyday.   Has the patient contacted their pharmacy? Yes.  Advised to contact PCP  Preferred Pharmacy (with phone number or street name):  [id number] [place], [place] - [address] Phone: [phone]  Fax: [phone]      Has the patient been seen for an appointment in the last year OR does the patient have an upcoming appointment? YES. F/U on 02/22/23    Also note a CRM was sent today 02/03/2023 by another agent as well.

## 2023-02-03 NOTE — ED Provider Notes (Signed)
[facility] Provider Note    Event Date/Time   First MD Initiated Contact with Patient 02/03/23 1347     (approximate)  History   Chief Complaint: Shortness of Breath  HPI  Tom [name] is a 57 y.o. male with a past medical history of COPD, hypertension, hyperlipidemia, cirrhosis, presents to the emergency department for shortness of breath.  According to the patient for the last several days he has had worsening shortness of breath and abdominal distention.  Patient had a paracentesis performed Monday and has another one scheduled for this coming Monday.  Patient states he ran out of his lactulose 4 days ago and was concerned that the ascites was building up too quickly causing his shortness of breath so he came to the emergency department for evaluation.  No abdominal pain or significant pain.  No cough congestion or fever.  No chest pain.  Patient states he tried to refill his lactulose but his insurance said it was not ready to be refilled yet until the 24th.  Patient did not believe he could go another 8 days without his lactulose so he came to the emergency department.  Physical Exam   Triage Vital Signs: ED Triage Vitals  Encounter Vitals Group     BP 02/03/23 1104 123/78     Systolic BP Percentile --      Diastolic BP Percentile --      Pulse Rate 02/03/23 1104 87     Resp 02/03/23 1104 17     Temp 02/03/23 1104 97.7 F (36.5 C)     Temp src --      SpO2 02/03/23 1104 100 %     Weight --      Height --      Head Circumference --      Peak Flow --      Pain Score 02/03/23 1101 6     Pain Loc --      Pain Education --      Exclude from Growth Chart --     Most recent vital signs: Vitals:   02/03/23 1104  BP: 123/78  Pulse: 87  Resp: 17  Temp: 97.7 F (36.5 C)  SpO2: 100%    General: Awake, no distress.  CV:  Good peripheral perfusion.  Regular rate and rhythm  Resp:  Normal effort.  Equal breath sounds bilaterally.   Abd:  No distention.  Moderate abdominal distention dull percussion consistent with ascites although soft with no tenderness.  Does not appear exceedingly taut.  No concern for SBP on exam.  ED Results / Procedures / Treatments   RADIOLOGY  I have reviewed and interpreted chest x-ray images.  Patient appears to have small consolidation/opacities throughout all lung fields.  I reviewed the patient's last chest x-ray of 01/09/2023 which is unchanged. Radiology has read the x-ray as a bilateral pulmonary nodules consistent with granulomata.   MEDICATIONS ORDERED IN ED: Medications  oxyCODONE (Oxy IR/ROXICODONE) immediate release tablet 10 mg (10 mg Oral Given 02/03/23 1407)     IMPRESSION / MDM / ASSESSMENT AND PLAN / ED COURSE  I reviewed the triage vital signs and the nursing notes.  Patient's presentation is most consistent with acute presentation with potential threat to life or bodily function.  Patient presents to the emergency department for abdominal distention and shortness of breath.  Overall the patient appears well satting 100% on room air with normal respiratory and pulse rate.  Afebrile.  No infectious symptoms  no abdominal pain or tenderness.  The abdomen is distended although somewhat soft does not appear overly taut.  Just had a paracentesis on Monday and has another one scheduled for this Monday.  Patient states he ran out of lactulose we will prescribe a course of lactulose for the patient he states he normally takes 15 mL / 10 mg once daily.  I have dosed the patient's home pain medication of 10 mg oxycodone in the emergency department as well.  Lab work is overall reassuring largely unchanged CBC with a normal white blood cell count reassuring chemistry chronic renal sufficiency largely unchanged from baseline.  Reassuringly normal ammonia.  Given the patient's reassuring workup I believe the patient is safe for discharge home to restart his lactulose continue his pain  medication and follow-up with his doctor on Monday for the scheduled paracentesis.  FINAL CLINICAL IMPRESSION(S) / ED DIAGNOSES   Dyspnea Ascites   Note:  This document was prepared using Dragon voice recognition software and may include unintentional dictation errors.   [name], MD 02/03/23 1521

## 2023-02-03 NOTE — Discharge Instructions (Addendum)
Please continue taking your lactulose as prescribed.  Please follow-up with your doctor regarding your pain medication.  Please follow-up on Monday for paracentesis as scheduled by your doctor.  Return to the emergency department for any symptom concerning to yourself.

## 2023-02-03 NOTE — Telephone Encounter (Signed)
Copied from CRM [id number]. Topic: General - Inquiry >> Feb 03, 2023  8:41 AM Marlow Baars wrote: Reason for CRM: The patient called in stating due to the pain in his foot and fluid buildup he is headed to the ER to be seen. He says he has cystic fibrosis as well and the pain meds are not really working anymore. Please assist patient further

## 2023-02-03 NOTE — ED Triage Notes (Signed)
Pt comes with c/o sob. Pt states cirrhosis of liver the patient has ran out of lactulose. Pt unable to get it filled. Pt states sob. Pt has fluid buildup. Pt states cellulitis in right foot is getting worse.  Pt states he feels really dizzy.

## 2023-02-03 NOTE — Telephone Encounter (Signed)
Routing for clarification on medication dosage instructions, medication is taken once everyday.

## 2023-02-06 ENCOUNTER — Ambulatory Visit
Admission: RE | Admit: 2023-02-06 | Discharge: 2023-02-06 | Disposition: A | Discharge status: Home / Self Care | Payer: Medicaid Other | Source: Ambulatory Visit | Attending: Gastroenterology | Admitting: Gastroenterology

## 2023-02-06 ENCOUNTER — Other Ambulatory Visit: Payer: Self-pay | Admitting: Gastroenterology

## 2023-02-06 ENCOUNTER — Other Ambulatory Visit: Payer: Self-pay | Admitting: Family Medicine

## 2023-02-06 DIAGNOSIS — K7031 Alcoholic cirrhosis of liver with ascites: Secondary | ICD-10-CM | POA: Insufficient documentation

## 2023-02-06 MED ORDER — ALBUMIN HUMAN 25 % IV SOLN
25.0000 g | Freq: Once | INTRAVENOUS | Status: AC
  Administered 2023-02-06: 25 g via INTRAVENOUS

## 2023-02-06 MED ORDER — ALBUMIN HUMAN 25 % IV SOLN
INTRAVENOUS | Status: AC
  Filled 2023-02-06: qty 200

## 2023-02-06 MED ORDER — ALBUMIN HUMAN 5 % IV SOLN
25.0000 g | Freq: Once | INTRAVENOUS | Status: DC
Start: 2023-02-06 — End: 2023-02-06

## 2023-02-06 MED ORDER — LIDOCAINE HCL (PF) 1 % IJ SOLN
10.0000 mL | Freq: Once | INTRAMUSCULAR | Status: AC
  Administered 2023-02-06: 10 mL via SUBCUTANEOUS

## 2023-02-06 NOTE — Telephone Encounter (Signed)
Medication Refill - Medication: Oxycodone HCl 10 MG TABS [[id number]]   Has the patient contacted their pharmacy? Yes.   (Agent: If no, request that the patient contact the pharmacy for the refill. If patient does not wish to contact the pharmacy document the reason why and proceed with request.) (Agent: If yes, when and what did the pharmacy advise?)  Preferred Pharmacy (with phone number or street name):  [id number] [place], [place] - [address] Phone: [phone]  Fax: [phone]     Has the patient been seen for an appointment in the last year OR does the patient have an upcoming appointment? Yes.    Agent: Please be advised that RX refills may take up to 3 business days. We ask that you follow-up with your pharmacy.

## 2023-02-07 ENCOUNTER — Other Ambulatory Visit: Payer: Self-pay

## 2023-02-07 ENCOUNTER — Ambulatory Visit: Payer: Medicaid Other | Admitting: Internal Medicine

## 2023-02-07 ENCOUNTER — Ambulatory Visit: Payer: Self-pay

## 2023-02-07 DIAGNOSIS — T8484XA Pain due to internal orthopedic prosthetic devices, implants and grafts, initial encounter: Secondary | ICD-10-CM

## 2023-02-07 DIAGNOSIS — Z515 Encounter for palliative care: Secondary | ICD-10-CM

## 2023-02-07 DIAGNOSIS — G8929 Other chronic pain: Secondary | ICD-10-CM

## 2023-02-07 MED ORDER — OXYCODONE HCL 10 MG PO TABS
10.0000 mg | ORAL_TABLET | Freq: Four times a day (QID) | ORAL | 0 refills | Status: DC | PRN
Start: 2023-02-07 — End: 2023-02-22

## 2023-02-07 NOTE — Telephone Encounter (Signed)
Reason for Disposition  [1] SEVERE pain (e.g., excruciating) AND [2] present > 1 hour  Answer Assessment - Initial Assessment Questions 1. LOCATION: "Where does it hurt?"      abdomen 3. ONSET: "When did the pain begin?" (Minutes, hours or days ago)      Seen at [facility] and had paracentesis and pt feels they did not draw enough fluid off.   5. PATTERN "Does the pain come and go, or is it constant?"    - If it comes and goes: "How long does it last?" "Do you have pain now?"     (Note: Comes and goes means the pain is intermittent. It goes away completely between bouts.)    - If constant: "Is it getting better, staying the same, or getting worse?"      (Note: Constant means the pain never goes away completely; most serious pain is constant and gets worse.)      constant 6. SEVERITY: "How bad is the pain?"  (e.g., Scale 1-10; mild, moderate, or severe)    - MILD (1-3): Doesn't interfere with normal activities, abdomen soft and not tender to touch.     - MODERATE (4-7): Interferes with normal activities or awakens from sleep, abdomen tender to touch.     - SEVERE (8-10): Excruciating pain, doubled over, unable to do any normal activities.       severe 7. RECURRENT SYMPTOM: "Have you ever had this type of stomach pain before?" If Yes, ask: "When was the last time?" and "What happened that time?"      yes 8. CAUSE: "What do you think is causing the stomach pain?"     ascites 9. RELIEVING/AGGRAVATING FACTORS: "What makes it better or worse?" (e.g., antacids, bending or twisting motion, bowel movement)     paracentesis  Protocols used: Abdominal Pain - Male-A-AH

## 2023-02-07 NOTE — Telephone Encounter (Addendum)
See prior phone calls. He has not followed up with [place], so there is not much we can do with the pain management. We arranged initial paracentesis orders through [facility], and then he would follow up with [facility] to arrange paracentesis through [facility] liver team.  He had one paracentesis done yesterday 02/06/23 already.  He has not been seen since 11/23/22. He has had dozens of hospital ED visits. I cannot manage his pain medication this way.  I will send the order now.  This can be a FINAL order on his pain medicine until he follows up with [place].  Saralyn Pilar, DO [facility] 02/07/2023, 12:00 PM

## 2023-02-07 NOTE — Telephone Encounter (Addendum)
Advised pt that Rene Kocher does not manage his pain medications and he would need to return to [facility] to have his fluid drained off.   Pt states he needs a refill on his pain medications right now.  He will go to [facility] for paracentesis.     Thanks,   -Vernona Rieger

## 2023-02-07 NOTE — Telephone Encounter (Signed)
Pt also wants to discuss pain management.

## 2023-02-07 NOTE — Telephone Encounter (Signed)
Requested medication (s) are due for refill today - yes  Requested medication (s) are on the active medication list -yes  Future visit scheduled -yes  Last refill: 01/24/23 #60  Notes to clinic: non delegated Rx  Requested Prescriptions  Pending Prescriptions Disp Refills   Oxycodone HCl 10 MG TABS 60 tablet 0    Sig: Take 1 tablet (10 mg total) by mouth every 6 (six) hours as needed.     Not Delegated - Analgesics:  Opioid Agonists Failed - 02/06/2023  4:14 PM      Failed - This refill cannot be delegated      Failed - Urine Drug Screen completed in last 360 days      Passed - Valid encounter within last 3 months    Recent Outpatient Visits           2 weeks ago Hospital discharge follow-up   [place] [facility] M[place]um, Oswaldo Conroy, [place]   2 months ago No-show for appointment   [facility] [name], DO   2 months ago Chronic foot pain, right   [place] [facility] [place], [name], DO   3 months ago Alcoholic cirrhosis of liver with ascites [facility])   [facility] [facility] [place], [name], DO   4 months ago Alcoholic cirrhosis of liver with ascites [facility])   [place] [facility] [name], DO       Future Appointments             In 2 weeks Althea Charon, [name], DO [facility], [facility]               Requested Prescriptions  Pending Prescriptions Disp Refills   Oxycodone HCl 10 MG TABS 60 tablet 0    Sig: Take 1 tablet (10 mg total) by mouth every 6 (six) hours as needed.     Not Delegated - Analgesics:  Opioid Agonists Failed - 02/06/2023  4:14 PM      Failed - This refill cannot be delegated      Failed - Urine Drug Screen completed in last 360 days      Passed - Valid encounter within last 3 months    Recent Outpatient Visits           2 weeks ago Hospital discharge follow-up   [facility], Oswaldo [place], [place]   2 months ago No-show for appointment   [facility] [name], DO   2 months ago Chronic foot pain, right   [facility] [facility] [place], [name], DO   3 months ago Alcoholic cirrhosis of liver with ascites [facility])   [place] [facility] [place], [name], DO   4 months ago Alcoholic cirrhosis of liver with ascites [facility])   [place] [facility] Althea Charon, [name], DO       Future Appointments             In 2 weeks Althea Charon, [name], DO  [facility], [facility]

## 2023-02-08 ENCOUNTER — Other Ambulatory Visit: Payer: Self-pay | Admitting: Gastroenterology

## 2023-02-08 ENCOUNTER — Telehealth: Payer: Self-pay

## 2023-02-08 ENCOUNTER — Other Ambulatory Visit: Payer: Self-pay | Admitting: Family Medicine

## 2023-02-08 DIAGNOSIS — K7031 Alcoholic cirrhosis of liver with ascites: Secondary | ICD-10-CM

## 2023-02-08 DIAGNOSIS — R112 Nausea with vomiting, unspecified: Secondary | ICD-10-CM

## 2023-02-08 NOTE — Telephone Encounter (Signed)
Patient called to clarify the medication needed. Advised docusate sodium is used for constipation not nausea, but ondansetron is what is listed and last prescribed by Dr. Althea Charon for nausea. He says that's what he needs, he doesn't have the box that it was in to verify the correct name. Advised I will send this to Dr. Althea Charon for refill.

## 2023-02-08 NOTE — Telephone Encounter (Signed)
Medication Refill - Medication: Pt stated he needs his nausea medication refilled. Pt was unsure of the name but gave me the medication name below. Docusate Sodium (DSS) 100 MG CAPS.  Please advise.   Has the patient contacted their pharmacy? No. (Agent: If no, request that the patient contact the pharmacy for the refill. If patient does not wish to contact the pharmacy document the reason why and proceed with request.)   Preferred Pharmacy (with phone number or street name):  [id number] [place], [place] - [address]  [address] [place] [address]  Phone: [phone] Fax: [phone]  Hours: Not open 24 hours   Has the patient been seen for an appointment in the last year OR does the patient have an upcoming appointment? Yes.    Agent: Please be advised that RX refills may take up to 3 business days. We ask that you follow-up with your pharmacy.

## 2023-02-08 NOTE — Telephone Encounter (Signed)
Pt called and apologized for "being rude" to this NT yesterday. Assured pt that When a pt does not feel good, it is understood that sometimes anger is present when worried or frustrated. Thanked pt for the call. Also wanted to extend his apology to Dr. Althea Charon for his rudeness. He stated he appreciates his care and is sorry.

## 2023-02-09 ENCOUNTER — Other Ambulatory Visit: Payer: Medicaid Other | Admitting: *Deleted

## 2023-02-09 MED ORDER — ONDANSETRON 4 MG PO TBDP
4.0000 mg | ORAL_TABLET | Freq: Three times a day (TID) | ORAL | 2 refills | Status: AC | PRN
Start: 2023-02-09 — End: ?

## 2023-02-09 NOTE — Patient Outreach (Signed)
  Medicaid Managed Care   Unsuccessful Attempt Note   02/09/2023 Name: TARIF MCMANAWAY MRN: [id number] DOB: June 09, 1966  Referred by: [name], DO Reason for referral : High Risk Managed Medicaid (Unsuccessful RNCM follow up telephone outreach)   An unsuccessful telephone outreach was attempted today. The patient was referred to the case management team for assistance with care management and care coordination.    Follow Up Plan: A HIPAA compliant phone message was left for the patient providing contact information and requesting a return call. and The Managed Medicaid care management team will reach out to the patient again over the next 7 days.    [name] RN, BSN [place]  Value-Based Care Institute [facility] RN Care Coordinator [phone]

## 2023-02-09 NOTE — Telephone Encounter (Signed)
Requested medication (s) are due for refill today: yes  Requested medication (s) are on the active medication list: yes    Last refill: 10/10/22  #30  2 refills  Future visit scheduled yes  02/22/23  Notes to clinic: Not delegated, please review. Thank you.  Requested Prescriptions  Pending Prescriptions Disp Refills   ondansetron (ZOFRAN-ODT) 4 MG disintegrating tablet 30 tablet 2    Sig: Take 1 tablet (4 mg total) by mouth every 8 (eight) hours as needed for nausea or vomiting.     Not Delegated - Gastroenterology: Antiemetics - ondansetron Failed - 02/08/2023  1:18 PM      Failed - This refill cannot be delegated      Failed - AST in normal range and within 360 days    AST  Date Value Ref Range Status  01/24/2023 42 (H) 15 - 41 U/L Final         Passed - ALT in normal range and within 360 days    ALT  Date Value Ref Range Status  01/24/2023 21 0 - 44 U/L Final         Passed - Valid encounter within last 6 months    Recent Outpatient Visits           2 weeks ago Hospital discharge follow-up   [place] [facility] Mecum, Oswaldo Conroy, [place]   2 months ago No-show for appointment   [facility] [name], DO   2 months ago Chronic foot pain, right   [facility] [place], [name], DO   3 months ago Alcoholic cirrhosis of liver with ascites [facility])   [place] [facility] [place], [name], DO   4 months ago Alcoholic cirrhosis of liver with ascites [facility])   [place] [facility] Althea Charon, [name], DO       Future Appointments             In 1 week Althea Charon, [name], DO [place] [facility], [facility]

## 2023-02-09 NOTE — Patient Instructions (Signed)
Visit Information  Mr. [name]  - as a part of your Medicaid benefit, you are eligible for care management and care coordination services at no cost or copay. I was unable to reach you by phone today but would be happy to help you with your health related needs. Please feel free to call me @ [phone].   A member of the Managed Medicaid care management team will reach out to you again over the next 7 days.   [name] RN, BSN [place]  Value-Based Care Institute [facility] RN Care Coordinator [phone]

## 2023-02-13 ENCOUNTER — Ambulatory Visit
Admission: RE | Admit: 2023-02-13 | Discharge: 2023-02-13 | Disposition: A | Discharge status: Home / Self Care | Payer: Medicaid Other | Source: Ambulatory Visit | Attending: Gastroenterology | Admitting: Gastroenterology

## 2023-02-13 DIAGNOSIS — K7031 Alcoholic cirrhosis of liver with ascites: Secondary | ICD-10-CM | POA: Insufficient documentation

## 2023-02-13 MED ORDER — LIDOCAINE HCL (PF) 1 % IJ SOLN
10.0000 mL | Freq: Once | INTRAMUSCULAR | Status: AC
  Administered 2023-02-13: 10 mL via INTRADERMAL
  Filled 2023-02-13: qty 10

## 2023-02-13 MED ORDER — ALBUMIN HUMAN 25 % IV SOLN: 25.0000 g | Freq: Once | INTRAVENOUS | Status: DC

## 2023-02-13 MED ORDER — ALBUMIN HUMAN 25 % IV SOLN
INTRAVENOUS | Status: AC
  Filled 2023-02-13: qty 100

## 2023-02-13 MED ORDER — ALBUMIN HUMAN 25 % IV SOLN: 12.5000 g | Freq: Once | INTRAVENOUS | Status: DC

## 2023-02-13 MED ORDER — ALBUMIN HUMAN 25 % IV SOLN
25.0000 g | Freq: Once | INTRAVENOUS | Status: AC
  Administered 2023-02-13: 25 g via INTRAVENOUS

## 2023-02-13 NOTE — Procedures (Signed)
PROCEDURE SUMMARY:  Successful image-guided paracentesis from the right lower abdomen.  Yielded 8.2 liters of clear yellow fluid.  No immediate complications.  EBL = trace. Patient tolerated well.   Specimen was not sent for labs.  Please see imaging section of Epic for full dictation.   Kennieth Francois PA-C 02/13/2023 1:02 PM

## 2023-02-16 DIAGNOSIS — L03119 Cellulitis of unspecified part of limb: Secondary | ICD-10-CM | POA: Diagnosis not present

## 2023-02-16 DIAGNOSIS — M86671 Other chronic osteomyelitis, right ankle and foot: Secondary | ICD-10-CM | POA: Diagnosis not present

## 2023-02-21 ENCOUNTER — Ambulatory Visit
Admission: RE | Admit: 2023-02-21 | Discharge: 2023-02-21 | Disposition: A | Discharge status: Home / Self Care | Payer: Medicaid Other | Source: Ambulatory Visit | Attending: Gastroenterology | Admitting: Gastroenterology

## 2023-02-21 DIAGNOSIS — H5203 Hypermetropia, bilateral: Secondary | ICD-10-CM | POA: Diagnosis not present

## 2023-02-21 DIAGNOSIS — K7031 Alcoholic cirrhosis of liver with ascites: Secondary | ICD-10-CM | POA: Diagnosis present

## 2023-02-21 DIAGNOSIS — H5213 Myopia, bilateral: Secondary | ICD-10-CM | POA: Diagnosis not present

## 2023-02-21 MED ORDER — ALBUMIN HUMAN 25 % IV SOLN: 25.0000 g | Freq: Once | INTRAVENOUS | Status: DC

## 2023-02-21 MED ORDER — LIDOCAINE HCL (PF) 1 % IJ SOLN
10.0000 mL | Freq: Once | INTRAMUSCULAR | Status: AC
  Administered 2023-02-21: 10 mL via INTRADERMAL
  Filled 2023-02-21: qty 10

## 2023-02-21 MED ORDER — ALBUMIN HUMAN 25 % IV SOLN
INTRAVENOUS | Status: AC
  Filled 2023-02-21: qty 100

## 2023-02-21 NOTE — Procedures (Signed)
PROCEDURE SUMMARY:  Successful ultrasound guided paracentesis from the right lower quadrant.  Yielded 8.7 L  of straw colored fluid.  No immediate complications.  The patient tolerated the procedure well.   EBL < 2mL  The patient has required >/=2 paracenteses in a 30 day period and a screening evaluation by the [facility] Interventional Radiology Portal [facility] has been arranged.

## 2023-02-21 NOTE — Progress Notes (Addendum)
Patient arrived from ultrasound with 8.7L removed and albumin 25% 100G ordered.  Vs obtained, patient requesting food.  NT provided lunch box and beverage.  Attempted iv x 1 unsuccessful.  Patient requests to leave and go home without 2nd attempt at IV and medication administration

## 2023-02-22 ENCOUNTER — Ambulatory Visit: Payer: Medicaid Other | Admitting: Family Medicine

## 2023-02-22 ENCOUNTER — Encounter: Payer: Self-pay | Admitting: Family Medicine

## 2023-02-22 VITALS — BP 124/70 | HR 95 | Ht 72.0 in | Wt 217.0 lb

## 2023-02-22 DIAGNOSIS — Z515 Encounter for palliative care: Secondary | ICD-10-CM

## 2023-02-22 DIAGNOSIS — K7031 Alcoholic cirrhosis of liver with ascites: Secondary | ICD-10-CM | POA: Diagnosis not present

## 2023-02-22 DIAGNOSIS — M79671 Pain in right foot: Secondary | ICD-10-CM

## 2023-02-22 DIAGNOSIS — L299 Pruritus, unspecified: Secondary | ICD-10-CM

## 2023-02-22 DIAGNOSIS — G8929 Other chronic pain: Secondary | ICD-10-CM

## 2023-02-22 DIAGNOSIS — T8484XA Pain due to internal orthopedic prosthetic devices, implants and grafts, initial encounter: Secondary | ICD-10-CM

## 2023-02-22 MED ORDER — HYDROXYZINE PAMOATE 25 MG PO CAPS
25.0000 mg | ORAL_CAPSULE | Freq: Three times a day (TID) | ORAL | 2 refills | Status: DC | PRN
Start: 2023-02-22 — End: 2023-04-05

## 2023-02-22 MED ORDER — OXYCODONE HCL 10 MG PO TABS
10.0000 mg | ORAL_TABLET | Freq: Four times a day (QID) | ORAL | 0 refills | Status: DC | PRN
Start: 2023-02-22 — End: 2023-03-06

## 2023-02-22 NOTE — Progress Notes (Unsigned)
Subjective:    Patient ID: Tom [name], male    DOB: 1966-03-19, 57 y.o.   MRN: [id number]  Tom [name] is a 57 y.o. male presenting on 02/22/2023 for palliative care and Cirrhosis   HPI   Followed by [facility] Orthopedics / Infectious Disease He was placed on IV Infusion  Upcoming apt with [facility] Transplant Team  Pain Medication refill today Last filled Oxycodone 10mg , 60 pills on 02/07/23, he has been taking them less often now that he has done better with more frequent paracentesis drainage of fluid. He has had less edema and reduced pain.  He has better function and mobility now with less swelling.  He is on Spironolactone 50mg  (taking x 2 of 25mg ) He is no longer on Furosemide  Followed by [facility] Palliative team LCSW      02/22/2023    2:40 PM 03/21/2022    2:56 PM 12/16/2020    3:16 PM  Depression screen PHQ 2/9  Decreased Interest 0 0 0  Down, Depressed, Hopeless 0 0 0  PHQ - 2 Score 0 0 0  Altered sleeping  0 0  Tired, decreased energy  0 0  Change in appetite  0 0  Feeling bad or failure about yourself   0 0  Trouble concentrating  0 0  Moving slowly or fidgety/restless  0 0  Suicidal thoughts  0 0  PHQ-9 Score  0 0  Difficult doing work/chores  Not difficult at all Not difficult at all    Social History   Tobacco Use   Smoking status: Former    Current packs/day: 0.00    Average packs/day: 1 pack/day for 39.0 years (39.0 ttl pk-yrs)    Types: Cigarettes    Start date: 12/1976    Quit date: 12/2015    Years since quitting: 7.1   Smokeless tobacco: Never  Vaping Use   Vaping status: Never Used  Substance Use Topics   Alcohol use: No   Drug use: Yes    Types: Marijuana    Review of Systems Per HPI unless specifically indicated above     Objective:    BP 124/70   Pulse 95   Ht 6' (1.829 m)   Wt 217 lb (98.4 kg)   SpO2 100%   BMI 29.43 kg/m   Wt Readings from Last 3 Encounters:  02/22/23 217 lb (98.4 kg)  01/20/23 221  lb (100.2 kg)  01/16/23 227 lb 8.2 oz (103.2 kg)    Physical Exam Vitals and nursing note reviewed.  Constitutional:      General: He is not in acute distress.    Appearance: He is well-developed. He is not diaphoretic.     Comments: Notable weight loss, comfortable, cooperative  HENT:     Head: Normocephalic and atraumatic.  Eyes:     General:        Right eye: No discharge.        Left eye: No discharge.     Conjunctiva/sclera: Conjunctivae normal.  Neck:     Thyroid: No thyromegaly.  Cardiovascular:     Rate and Rhythm: Normal rate and regular rhythm.     Pulses: Normal pulses.     Heart sounds: Normal heart sounds. No murmur heard. Pulmonary:     Effort: Pulmonary effort is normal. No respiratory distress.     Breath sounds: Normal breath sounds. No wheezing or rales.  Musculoskeletal:        General: Normal range of motion.  Cervical back: Normal range of motion and neck supple.     Comments: Right lower extremity in walker boot  Lymphadenopathy:     Cervical: No cervical adenopathy.  Skin:    General: Skin is warm and dry.     Findings: No erythema or rash.  Neurological:     Mental Status: He is alert and oriented to person, place, and time. Mental status is at baseline.  Psychiatric:        Behavior: Behavior normal.     Comments: Well groomed, good eye contact, normal speech and thoughts    Results for orders placed or performed during the hospital encounter of 02/03/23  Basic metabolic panel  Result Value Ref Range   Sodium 134 (L) 135 - 145 mmol/L   Potassium 4.2 3.5 - 5.1 mmol/L   Chloride 107 98 - 111 mmol/L   CO2 21 (L) 22 - 32 mmol/L   Glucose, Bld 155 (H) 70 - 99 mg/dL   BUN 22 (H) 6 - 20 mg/dL   Creatinine, Ser 1.61 (H) 0.61 - 1.24 mg/dL   Calcium 8.1 (L) 8.9 - 10.3 mg/dL   GFR, Estimated 47 (L) >60 mL/min   Anion gap 6 5 - 15  CBC  Result Value Ref Range   WBC 8.0 4.0 - 10.5 K/uL   RBC 3.69 (L) 4.22 - 5.81 MIL/uL   Hemoglobin 10.1 (L)  13.0 - 17.0 g/dL   HCT 09.6 (L) 04.5 - 40.9 %   MCV 82.1 80.0 - 100.0 fL   MCH 27.4 26.0 - 34.0 pg   MCHC 33.3 30.0 - 36.0 g/dL   RDW 81.1 (H) 91.4 - 78.2 %   Platelets 166 150 - 400 K/uL   nRBC 0.0 0.0 - 0.2 %  Ammonia  Result Value Ref Range   Ammonia 18 9 - 35 umol/L      Assessment & Plan:   Problem List Items Addressed This Visit     Alcoholic cirrhosis of liver with ascites (HCC) - Primary   Other Visit Diagnoses     Itching       Relevant Medications   hydrOXYzine (VISTARIL) 25 MG capsule   Painful orthopaedic hardware (HCC)       Relevant Medications   Oxycodone HCl 10 MG TABS   Palliative care patient       Relevant Medications   Oxycodone HCl 10 MG TABS   Chronic foot pain, right       Relevant Medications   Oxycodone HCl 10 MG TABS       Keep up with [facility] Liver / Transplant Team  [facility] with [facility] Paracentesis scheduled procedure  Refill Oxycodone 10mg , repeat order for 60 pills, goal for lasting 2 weeks. Has improved since regular paracentesis, less swelling pain in foot.  Followed by [facility] ID / Ortho, advised him to follow up with ID and discuss antibiotic concerns.  Refilled Hydroxyzine  Meds ordered this encounter  Medications   hydrOXYzine (VISTARIL) 25 MG capsule    Sig: Take 1 capsule (25 mg total) by mouth every 8 (eight) hours as needed.    Dispense:  30 capsule    Refill:  2   Oxycodone HCl 10 MG TABS    Sig: Take 1 tablet (10 mg total) by mouth every 6 (six) hours as needed.    Dispense:  60 tablet    Refill:  0     Follow up plan: Return in about 6 weeks (around 04/05/2023) for 6 week  follow-up Cirrhosis, Pain Management.  Saralyn Pilar, DO [facility] 02/22/2023, 2:51 PM

## 2023-02-22 NOTE — Patient Instructions (Addendum)
Thank you for coming to the office today.  Keep up with [facility] Liver / Transplant Team  Refill Oxycodone 10mg , repeat order for 60 pills, goal for lasting 2 weeks.  Refilled Hydroxyzine  Please schedule a Follow-up Appointment to: Return in about 6 weeks (around 04/05/2023) for 6 week follow-up Cirrhosis, Pain Management.  If you have any other questions or concerns, please feel free to call the office or send a message through MyChart. You may also schedule an earlier appointment if necessary.  Additionally, you may be receiving a survey about your experience at our office within a few days to 1 week by e-mail or mail. We value your feedback.  Saralyn Pilar, DO [facility], [place]

## 2023-02-23 ENCOUNTER — Telehealth: Payer: Self-pay

## 2023-02-23 ENCOUNTER — Encounter: Payer: Self-pay | Admitting: Family Medicine

## 2023-02-23 NOTE — Telephone Encounter (Signed)
...   Medicaid Managed Care   Unsuccessful Outreach Note  02/23/2023 Name: Tom [name] MRN: [id number] DOB: 1966-02-10  Referred by: [name], DO Reason for referral : Appointment (I called the patient today to reschedule his missed phone appt with the MM RNCM. He did not answer and I was not able to leave a message.)   A second unsuccessful telephone outreach was attempted today. The patient was referred to the case management team for assistance with care management and care coordination.   Follow Up Plan: The care management team will reach out to the patient again over the next 7 days.   Weston Settle Care Guide  [facility] Managed  Care Guide [facility]  [phone]

## 2023-02-27 ENCOUNTER — Other Ambulatory Visit: Payer: Self-pay | Admitting: Transplant Hepatology

## 2023-02-27 ENCOUNTER — Other Ambulatory Visit: Payer: Self-pay | Admitting: General Surgery

## 2023-02-27 ENCOUNTER — Other Ambulatory Visit: Payer: Self-pay | Admitting: Gastroenterology

## 2023-02-27 DIAGNOSIS — K7031 Alcoholic cirrhosis of liver with ascites: Secondary | ICD-10-CM

## 2023-02-27 DIAGNOSIS — K7460 Unspecified cirrhosis of liver: Secondary | ICD-10-CM

## 2023-02-28 ENCOUNTER — Other Ambulatory Visit
Admission: RE | Admit: 2023-02-28 | Discharge: 2023-02-28 | Disposition: A | Discharge status: Home / Self Care | Payer: Medicaid Other | Source: Ambulatory Visit | Attending: Gastroenterology | Admitting: Gastroenterology

## 2023-02-28 ENCOUNTER — Telehealth: Payer: Self-pay

## 2023-02-28 ENCOUNTER — Ambulatory Visit
Admission: RE | Admit: 2023-02-28 | Discharge: 2023-02-28 | Disposition: A | Discharge status: Home / Self Care | Payer: Medicaid Other | Source: Ambulatory Visit | Attending: Transplant Hepatology | Admitting: Transplant Hepatology

## 2023-02-28 DIAGNOSIS — K7460 Unspecified cirrhosis of liver: Secondary | ICD-10-CM | POA: Insufficient documentation

## 2023-02-28 LAB — CBC
HCT: 33.6 % — ABNORMAL LOW (ref 39.0–52.0)
Hemoglobin: 10.8 g/dL — ABNORMAL LOW (ref 13.0–17.0)
MCH: 26.5 pg (ref 26.0–34.0)
MCHC: 32.1 g/dL (ref 30.0–36.0)
MCV: 82.6 fL (ref 80.0–100.0)
Platelets: 273 10*3/uL (ref 150–400)
RBC: 4.07 MIL/uL — ABNORMAL LOW (ref 4.22–5.81)
RDW: 15.4 % (ref 11.5–15.5)
WBC: 12.9 10*3/uL — ABNORMAL HIGH (ref 4.0–10.5)
nRBC: 0 % (ref 0.0–0.2)

## 2023-02-28 LAB — COMPREHENSIVE METABOLIC PANEL
ALT: 15 U/L (ref 0–44)
AST: 32 U/L (ref 15–41)
Albumin: 2.4 g/dL — ABNORMAL LOW (ref 3.5–5.0)
Alkaline Phosphatase: 117 U/L (ref 38–126)
Anion gap: 8 (ref 5–15)
BUN: 16 mg/dL (ref 6–20)
CO2: 23 mmol/L (ref 22–32)
Calcium: 8.5 mg/dL — ABNORMAL LOW (ref 8.9–10.3)
Chloride: 103 mmol/L (ref 98–111)
Creatinine, Ser: 1.08 mg/dL (ref 0.61–1.24)
GFR, Estimated: >60 mL/min (ref >60)
Glucose, Bld: 118 mg/dL — ABNORMAL HIGH (ref 70–99)
Potassium: 4 mmol/L (ref 3.5–5.1)
Sodium: 134 mmol/L — ABNORMAL LOW (ref 135–145)
Total Bilirubin: 1.4 mg/dL — ABNORMAL HIGH (ref 0.3–1.2)
Total Protein: 6 g/dL — ABNORMAL LOW (ref 6.5–8.1)

## 2023-02-28 LAB — PROTIME-INR
INR: 1.3 — ABNORMAL HIGH (ref 0.8–1.2)
Prothrombin Time: 16.4 s — ABNORMAL HIGH (ref 11.4–15.2)

## 2023-02-28 MED ORDER — LIDOCAINE HCL (PF) 1 % IJ SOLN
10.0000 mL | Freq: Once | INTRAMUSCULAR | Status: AC
  Administered 2023-02-28: 10 mL via INTRADERMAL
  Filled 2023-02-28: qty 10

## 2023-02-28 NOTE — Telephone Encounter (Signed)
..   Medicaid Managed Care   Unsuccessful Outreach Note  02/28/2023 Name: Tom [name] MRN: [id number] DOB: 06-03-1966  Referred by: [name], DO Reason for referral : Appointment   Third unsuccessful telephone outreach was attempted today. The patient was referred to the case management team for assistance with care management and care coordination. The patient's primary care provider has been notified of our unsuccessful attempts to make or maintain contact with the patient. The care management team is pleased to engage with this patient at any time in the future should he/she be interested in assistance from the care management team.   Follow Up Plan: We have been unable to make contact with the patient for follow up. The care management team is available to follow up with the patient after provider conversation with the patient regarding recommendation for care management engagement and subsequent re-referral to the care management team.   Weston Settle Care Guide  [facility] Managed  Care Guide [facility]  [phone]

## 2023-02-28 NOTE — Procedures (Signed)
PROCEDURE SUMMARY:  Successful ultrasound guided paracentesis from the right lower quadrant.  Yielded 8.1 L of clear yellow fluid.  No immediate complications.  The patient tolerated the procedure well.   Specimen not sent for labs.  EBL < 2 mL  ** Patient currently undergoing work up with [employer]. No plans for formal TIPS evaluation with the Portal Hypertension Clinic at this time.   Alwyn Ren, [place] [phone] 02/28/2023, 10:16 AM

## 2023-03-01 ENCOUNTER — Other Ambulatory Visit: Payer: Medicaid Other | Admitting: *Deleted

## 2023-03-01 ENCOUNTER — Encounter: Payer: Self-pay | Admitting: *Deleted

## 2023-03-01 NOTE — Patient Outreach (Signed)
Medicaid Managed Care   Nurse Care Manager Note  03/01/2023 Name:  Tom [name] MRN:  [id number] DOB:  [birth year]  Tom [name] is an 57 y.o. year old male who is a primary patient of [name], DO.  The [facility] Managed Care Coordination team was consulted for assistance with:    Liver Cirrhosis  Mr. [name] was given information about Medicaid Managed Care Coordination team services today. [name] Patient agreed to services and verbal consent obtained.  Engaged with patient by telephone for follow up visit in response to provider referral for case management and/or care coordination services.   Assessments/Interventions:  Review of past medical history, allergies, medications, health status, including review of consultants reports, laboratory and other test data, was performed as part of comprehensive evaluation and provision of chronic care management services.  SDOH (Social Determinants of Health) assessments and interventions performed: SDOH Interventions    Flowsheet Row Telephone from 01/11/2023 in [place] POPULATION HEALTH DEPARTMENT ED to Hosp-Admission (Discharged) from 12/26/2022 in [facility] to Hosp-Admission (Discharged) from 12/15/2022 in [place] [facility] Patient Outreach Telephone from 10/19/2022 in [place] POPULATION HEALTH DEPARTMENT Telephone from 09/28/2022 in [place] POPULATION HEALTH DEPARTMENT  SDOH Interventions       Transportation Interventions Intervention Not Indicated Inpatient TOC, Other (Comment) Inpatient TOC -- Intervention Not Indicated  Utilities Interventions -- -- -- Intervention Not Indicated --       Care Plan  Allergies  Allergen Reactions   Alfuzosin Other (See Comments)    Dizziness    Amlodipine Nausea And Vomiting   Bee Venom Swelling   Cymbalta [Duloxetine Hcl] Other (See Comments)    Emesis    Duloxetine  Other (See Comments)    Emesis   Lisinopril    Flomax [Tamsulosin Hcl] Itching    Medications Reviewed Today     Reviewed by [name], RN (Registered Nurse) on 03/01/23 at 1527  Med List Status: <None>   Medication Order Taking? Sig Documenting Provider Last Dose Status Informant  albuterol (PROVENTIL) (2.5 MG/3ML) 0.083% nebulizer solution [id number] Yes Take 3 mLs (2.5 mg total) by nebulization every 4 (four) hours as needed for wheezing or shortness of breath. [name], MD  Active Spouse/Significant Other  albuterol (VENTOLIN HFA) 108 (90 Base) MCG/ACT inhaler [id number] Yes Inhale 2 puffs into the lungs every 4 (four) hours as needed. [name], NP  Active   budesonide-formoterol [facility]) 80-4.5 MCG/ACT inhaler [id number] Yes Take 2 puffs first thing in am and then another 2 puffs about 12 hours later. [name], DO  Active Spouse/Significant Other  EPINEPHrine (EPIPEN 2-PAK) 0.3 mg/0.3 mL IJ SOAJ injection [id number]  Inject 0.3 mg into the muscle as needed for anaphylaxis. [name], DO  Active Spouse/Significant Other  famotidine (PEPCID) 20 MG tablet [id number]  Take 1 tablet (20 mg total) by mouth daily after supper. One after supper [name], DO  Active Spouse/Significant Other  Homeopathic Products (LIVER SUPPORT SL) [id number]  Place under the tongue.  Patient not taking: Reported on 02/01/2023   [provider]  Active Spouse/Significant Other  hydrOXYzine (VISTARIL) 25 MG capsule [id number]  Take 1 capsule (25 mg total) by mouth every 8 (eight) hours as needed. [name], DO  Active   lactulose [facility]) 10 GM/15ML solution [id number]  SMARTSIG:15 Milliliter(s) By Mouth Every Other Day [name], DO  Active Spouse/Significant Other  Med Note [name]   Tue Dec 27, 2022  2:03 AM) Patient takes every day.  lactulose (CHRONULAC) 10 GM/15ML solution [id number] Yes  Take 15 mLs (10 g total) by mouth daily. [name], MD  Active   magnesium oxide (MAG-OX) 400 (240 Mg) MG tablet [id number] Yes Take 2 tablets by mouth 2 (two) times daily. [provider]  Active Spouse/Significant Other  Multiple Vitamin (MULTIVITAMIN) capsule [id number] Yes Take 1 capsule by mouth daily. [provider]  Active Spouse/Significant Other  ondansetron (ZOFRAN-ODT) 4 MG disintegrating tablet [id number] Yes Take 1 tablet (4 mg total) by mouth every 8 (eight) hours as needed for nausea or vomiting. [name], DO  Active   Oxycodone HCl 10 MG TABS [id number] Yes Take 1 tablet (10 mg total) by mouth every 6 (six) hours as needed. [name], [name], DO  Active   pantoprazole (PROTONIX) 40 MG tablet [id number] Yes Take 1 tablet (40 mg total) by mouth daily before breakfast. [name], DO  Active Spouse/Significant Other  spironolactone (ALDACTONE) 25 MG tablet [id number]  Take 50 mg by mouth daily. [name], DO  Active Spouse/Significant Other  thiamine (VITAMIN B1) 100 MG tablet [id number]  Take 100 mg by mouth daily. [provider]  Active Spouse/Significant Other            Patient Active Problem List   Diagnosis Date Noted   Abdominal pain 01/16/2023   Anxiety 01/16/2023   Chronic kidney disease, stage 3a (HCC) 01/16/2023   Cellulitis of right lower extremity 12/31/2022   Obesity (BMI 30-39.9) 12/27/2022   Cellulitis of right foot 12/26/2022   Hyponatremia 12/17/2022   End stage liver disease (HCC) 12/15/2022   Dyslipidemia 12/04/2022   COPD with asthma 12/04/2022   GERD without esophagitis 12/04/2022   Abdominal distension 12/04/2022   Shortness of breath 12/04/2022   Ascites 12/03/2022   Right foot pain 11/15/2022   AKI (acute kidney injury) (HCC) 11/15/2022   Elevated lactic acid level 11/15/2022   Complication associated with orthopedic device (HCC) 10/21/2022   Alcoholic cirrhosis  of liver with ascites (HCC) 07/04/2022   Former smoker 09/16/2021   Asthmatic bronchitis , chronic 09/16/2021   Pre-diabetes 08/13/2021   Morbid obesity due to excess calories (HCC) 08/13/2021   DOE (dyspnea on exertion) 04/13/2021   Acute pain of left wrist 03/25/2021   DDD (degenerative disc disease), lumbar 12/16/2020   Chronic back pain 12/16/2020   Arthritis of right ankle 05/08/2017   Atherosclerosis of native coronary artery of native heart without angina pectoris 01/06/2015   Essential hypertension 10/03/2012   Osteoarthritis 12/06/2011    Conditions to be addressed/monitored per PCP order:   Liver Cirrhosis  Care Plan : RN Care Manager Plan of Care  Updates made by [name], RN since 03/01/2023 12:00 AM     Problem: Health Managment needs related to Liver Cirrhosis      Long-Range Goal: Development of Plan of Care to address Health Managment needs related to Liver Cirrhosis   Start Date: 02/01/2023  Expected End Date: 05/02/2023  Note:   Current Barriers:  Knowledge Deficits related to plan of care for management of Liver Cirrohsis Mr. Boody would like a central line or Port for blood draws and infusions. He is unsure of how to contact Hepatology to express concerns. Patient has not had a visit from Palliative Care in "months" and would like an update on signing up for Medicare. He lost the contact information  to reach Palliative Care provider.   RNCM Clinical Goal(s):  Patient will verbalize understanding of plan for management of Liver Cirrhosis as evidenced by patient reports attend all scheduled medical appointments: 03/06/23 for paracentesis, 9/17 for Colonoscopy/Endoscopy and 10/17 with PCP as evidenced by provider documentation in EMR        continue to work with RN Care Manager and/or Social Worker to address care management and care coordination needs related to Liver Cirrhosis as evidenced by adherence to CM Team Scheduled appointments     through  collaboration with Medical illustrator, provider, and care team.   Interventions: Evaluation of current treatment plan related to  self management and patient's adherence to plan as established by provider   Liver Cirrhosis  (Status: Goal on Track (progressing): YES.) Long Term Goal  Evaluation of current treatment plan related to  Liver Cirrhosis ,  self-management and patient's adherence to plan as established by provider. Discussed plans with patient for ongoing care management follow up and provided patient with direct contact information for care management team Advised patient to keep a calendar of visits, providers and contact information; Reviewed medications with patient and discussed pain management regime; Assessed social determinant of health barriers;  Advised patient to discuss any concerns or questions regarding liver transplant with Hepatology-discussed sending MyChart message is the best way to communicate  Collaborated with Hepatology [phone] requesting a call to patient to discuss possible central line placement Collaborated with [facility] [phone]-Patient is active with Palliative Care, representative will have someone give patient a call Reviewed provider note   Patient Goals/Self-Care Activities: Take medications as prescribed   Attend all scheduled provider appointments Call provider office for new concerns or questions        Follow Up:  Patient agrees to Care Plan and Follow-up.  Plan: The Managed Medicaid care management team will reach out to the patient again over the next 30 days.  Date/time of next scheduled RN care management/care coordination outreach:  03/31/23 @ 1:15pm  [name] RN, BSN [place]  Value-Based Care [facility] Health RN Care Coordinator [phone]

## 2023-03-01 NOTE — Patient Instructions (Signed)
Visit Information  Tom [name] was given information about Medicaid Managed Care team care coordination services as a part of their Healthy Blue Medicaid benefit. Tom [name] verbally consented to engagement with the [facility] Managed Care team.   If you are experiencing a medical emergency, please call 911 or report to your local emergency department or urgent care.   If you have a non-emergency medical problem during routine business hours, please contact your provider's office and ask to speak with a nurse.   For questions related to your Healthy [facility] health plan, please call: [phone] or visit the homepage here: [email or web address]  If you would like to schedule transportation through your Healthy [facility] plan, please call the following number at least 2 days in advance of your appointment: [phone]  For information about your ride after you set it up, call Ride Assist at [phone]. Use this number to activate a Will Call pickup, or if your transportation is late for a scheduled pickup. Use this number, too, if you need to make a change or cancel a previously scheduled reservation.  If you need transportation services right away, call [phone]. The after-hours call center is staffed 24 hours to handle ride assistance and urgent reservation requests (including discharges) 365 days a year. Urgent trips include sick visits, hospital discharge requests and life-sustaining treatment.  Call the [facility] Line at [phone], at any time, 24 hours a day, 7 days a week. If you are in danger or need immediate medical attention call 911.  If you would like help to quit smoking, call 1-800-QUIT-NOW (631-607-0338) OR Espaol: 1-855-Djelo-Ya (8-416-606-3016) o para ms informacin haga clic aqu or Text READY to 010-932 to register via text  Tom [name],   Please see education materials related to  central lines provided by MyChart link.  Patient verbalizes understanding of instructions and care plan provided today and agrees to view in MyChart. Active MyChart status and patient understanding of how to access instructions and care plan via MyChart confirmed with patient.     Telephone follow up appointment with Managed Medicaid care management team member scheduled for:03/31/23 @ 1:15pm  [name] RN, BSN [place]  Value-Based Care [facility] RN Care Coordinator [phone]   Following is a copy of your plan of care:  Care Plan : RN Care Manager Plan of Care  Updates made by [name], RN since 03/01/2023 12:00 AM     Problem: Health Managment needs related to Liver Cirrhosis      Long-Range Goal: Development of Plan of Care to address Health Managment needs related to Liver Cirrhosis   Start Date: 02/01/2023  Expected End Date: 05/02/2023  Note:   Current Barriers:  Knowledge Deficits related to plan of care for management of Liver Cirrohsis Tom [name] would like a central line or Port for blood draws and infusions. He is unsure of how to contact Hepatology to express concerns. Patient has not had a visit from Palliative Care in "months" and would like an update on signing up for Medicare. He lost the contact information to reach Palliative Care provider.   RNCM Clinical Goal(s):  Patient will verbalize understanding of plan for management of Liver Cirrhosis as evidenced by patient reports attend all scheduled medical appointments: 03/06/23 for paracentesis, 9/17 for Colonoscopy/Endoscopy and 10/17 with PCP as evidenced by provider documentation in EMR        continue to work with RN Care Manager and/or Social Worker to address care management  and care coordination needs related to Liver Cirrhosis as evidenced by adherence to CM Team Scheduled appointments     through collaboration with RN Care manager, provider, and care team.    Interventions: Evaluation of current treatment plan related to  self management and patient's adherence to plan as established by provider   Liver Cirrhosis  (Status: Goal on Track (progressing): YES.) Long Term Goal  Evaluation of current treatment plan related to  Liver Cirrhosis ,  self-management and patient's adherence to plan as established by provider. Discussed plans with patient for ongoing care management follow up and provided patient with direct contact information for care management team Advised patient to keep a calendar of visits, providers and contact information; Reviewed medications with patient and discussed pain management regime; Assessed social determinant of health barriers;  Advised patient to discuss any concerns or questions regarding liver transplant with Hepatology-discussed sending MyChart message is the best way to communicate  Collaborated with Hepatology [phone] requesting a call to patient to discuss possible central line placement Collaborated with [facility] [phone]-Patient is active with Palliative Care, representative will have someone give patient a call Reviewed provider note   Patient Goals/Self-Care Activities: Take medications as prescribed   Attend all scheduled provider appointments Call provider office for new concerns or questions

## 2023-03-02 ENCOUNTER — Emergency Department
Admission: EM | Admit: 2023-03-02 | Discharge: 2023-03-02 | Disposition: A | Discharge status: Home / Self Care | Payer: Medicaid Other | Attending: Emergency Medicine | Admitting: Emergency Medicine

## 2023-03-02 ENCOUNTER — Encounter: Payer: Self-pay | Admitting: Family Medicine

## 2023-03-02 ENCOUNTER — Other Ambulatory Visit: Payer: Self-pay

## 2023-03-02 ENCOUNTER — Other Ambulatory Visit: Payer: Medicaid Other

## 2023-03-02 DIAGNOSIS — K7031 Alcoholic cirrhosis of liver with ascites: Secondary | ICD-10-CM | POA: Insufficient documentation

## 2023-03-02 DIAGNOSIS — G2581 Restless legs syndrome: Secondary | ICD-10-CM

## 2023-03-02 DIAGNOSIS — R531 Weakness: Secondary | ICD-10-CM | POA: Diagnosis present

## 2023-03-02 LAB — CBC WITH DIFFERENTIAL/PLATELET
Abs Immature Granulocytes: 0.02 10*3/uL (ref 0.00–0.07)
Basophils Absolute: 0.2 10*3/uL — ABNORMAL HIGH (ref 0.0–0.1)
Basophils Relative: 2 %
Eosinophils Absolute: 0.5 10*3/uL (ref 0.0–0.5)
Eosinophils Relative: 5 %
HCT: 31.7 % — ABNORMAL LOW (ref 39.0–52.0)
Hemoglobin: 10.5 g/dL — ABNORMAL LOW (ref 13.0–17.0)
Immature Granulocytes: 0 %
Lymphocytes Relative: 23 %
Lymphs Abs: 2 10*3/uL (ref 0.7–4.0)
MCH: 27 pg (ref 26.0–34.0)
MCHC: 33.1 g/dL (ref 30.0–36.0)
MCV: 81.5 fL (ref 80.0–100.0)
Monocytes Absolute: 1.1 10*3/uL — ABNORMAL HIGH (ref 0.1–1.0)
Monocytes Relative: 12 %
Neutro Abs: 4.9 10*3/uL (ref 1.7–7.7)
Neutrophils Relative %: 58 %
Platelets: 255 10*3/uL (ref 150–400)
RBC: 3.89 MIL/uL — ABNORMAL LOW (ref 4.22–5.81)
RDW: 15.8 % — ABNORMAL HIGH (ref 11.5–15.5)
WBC: 8.6 10*3/uL (ref 4.0–10.5)
nRBC: 0 % (ref 0.0–0.2)

## 2023-03-02 LAB — COMPREHENSIVE METABOLIC PANEL
ALT: 18 U/L (ref 0–44)
AST: 40 U/L (ref 15–41)
Albumin: 2.1 g/dL — ABNORMAL LOW (ref 3.5–5.0)
Alkaline Phosphatase: 101 U/L (ref 38–126)
Anion gap: 7 (ref 5–15)
BUN: 15 mg/dL (ref 6–20)
CO2: 23 mmol/L (ref 22–32)
Calcium: 8.2 mg/dL — ABNORMAL LOW (ref 8.9–10.3)
Chloride: 104 mmol/L (ref 98–111)
Creatinine, Ser: 1.04 mg/dL (ref 0.61–1.24)
GFR, Estimated: >60 mL/min (ref >60)
Glucose, Bld: 158 mg/dL — ABNORMAL HIGH (ref 70–99)
Potassium: 3.7 mmol/L (ref 3.5–5.1)
Sodium: 134 mmol/L — ABNORMAL LOW (ref 135–145)
Total Bilirubin: 1.6 mg/dL — ABNORMAL HIGH (ref 0.3–1.2)
Total Protein: 5.6 g/dL — ABNORMAL LOW (ref 6.5–8.1)

## 2023-03-02 MED ORDER — ALBUMIN HUMAN 25 % IV SOLN
50.0000 g | Freq: Once | INTRAVENOUS | Status: AC
  Administered 2023-03-02: 50 g via INTRAVENOUS
  Filled 2023-03-02: qty 200

## 2023-03-02 MED ORDER — OXYCODONE HCL 5 MG PO TABS
10.0000 mg | ORAL_TABLET | Freq: Once | ORAL | Status: AC
  Administered 2023-03-02: 10 mg via ORAL
  Filled 2023-03-02: qty 2

## 2023-03-02 NOTE — ED Triage Notes (Signed)
Pt had a paracentesis on Tuesday, wasn't able to tolerate the iv due to pain Tuesday and didn't get the albumin infusion, pt reports that he feels dizzy and weak, and states that he is having pain and doesn't have any more pain medications, pt also requesting a vaccination due to prepping for a liver transplant

## 2023-03-02 NOTE — Patient Outreach (Signed)
Medicaid Managed Care Social Work Note  03/02/2023 Name:  Tom [name] MRN:  [id number] DOB:  10/13/1965  Tom [name] is an 57 y.o. year old male who is a primary patient of [name], DO.  The [facility] Managed Care Coordination team was consulted for assistance with:  Food Insecurity  [name] was given information about Medicaid Managed Care Coordination team services today. [name] Patient agreed to services and verbal consent obtained.  Engaged with patient  for by telephone forfollow up visit in response to referral for case management and/or care coordination services.   Assessments/Interventions:  Review of past medical history, allergies, medications, health status, including review of consultants reports, laboratory and other test data, was performed as part of comprehensive evaluation and provision of chronic care management services.  SDOH: (Social Determinant of Health) assessments and interventions performed: SDOH Interventions    Flowsheet Row Telephone from 01/11/2023 in [place] POPULATION HEALTH DEPARTMENT ED to Hosp-Admission (Discharged) from 12/26/2022 in [facility] ED to Hosp-Admission (Discharged) from 12/15/2022 in [facility] Patient Outreach Telephone from 10/19/2022 in [place] POPULATION HEALTH DEPARTMENT Telephone from 09/28/2022 in [place] POPULATION HEALTH DEPARTMENT  SDOH Interventions       Transportation Interventions Intervention Not Indicated Inpatient TOC, Other (Comment) Inpatient TOC -- Intervention Not Indicated  Utilities Interventions -- -- -- Intervention Not Indicated --     BSW completed a telephone outreach with patient. He states he did receive the resources BSW sent but thought someone was going to deliver food. Patient states he would also like information on applying for Medicare. BSW directed patient to  Medicare.gov and will send patient information via mail. No other resources are needed at this time.  Advanced Directives Status:  Not addressed in this encounter.  Care Plan                 Allergies  Allergen Reactions   Alfuzosin Other (See Comments)    Dizziness    Amlodipine Nausea And Vomiting   Bee Venom Swelling   Cymbalta [Duloxetine Hcl] Other (See Comments)    Emesis    Duloxetine Other (See Comments)    Emesis   Lisinopril    Flomax [Tamsulosin Hcl] Itching    Medications Reviewed Today   Medications were not reviewed in this encounter     Patient Active Problem List   Diagnosis Date Noted   Abdominal pain 01/16/2023   Anxiety 01/16/2023   Chronic kidney disease, stage 3a (HCC) 01/16/2023   Cellulitis of right lower extremity 12/31/2022   Obesity (BMI 30-39.9) 12/27/2022   Cellulitis of right foot 12/26/2022   Hyponatremia 12/17/2022   End stage liver disease (HCC) 12/15/2022   Dyslipidemia 12/04/2022   COPD with asthma 12/04/2022   GERD without esophagitis 12/04/2022   Abdominal distension 12/04/2022   Shortness of breath 12/04/2022   Ascites 12/03/2022   Right foot pain 11/15/2022   AKI (acute kidney injury) (HCC) 11/15/2022   Elevated lactic acid level 11/15/2022   Complication associated with orthopedic device (HCC) 10/21/2022   Alcoholic cirrhosis of liver with ascites (HCC) 07/04/2022   Former smoker 09/16/2021   Asthmatic bronchitis , chronic 09/16/2021   Pre-diabetes 08/13/2021   Morbid obesity due to excess calories (HCC) 08/13/2021   DOE (dyspnea on exertion) 04/13/2021   Acute pain of left wrist 03/25/2021   DDD (degenerative disc disease), lumbar 12/16/2020   Chronic back pain 12/16/2020  Arthritis of right ankle 05/08/2017   Atherosclerosis of native coronary artery of native heart without angina pectoris 01/06/2015   Essential hypertension 10/03/2012   Osteoarthritis 12/06/2011    Conditions to be addressed/monitored per PCP  order:   food and Medicare resources  There are no care plans that you recently modified to display for this patient.   Follow up:  Patient agrees to Care Plan and Follow-up.  Plan: The Managed Medicaid care management team will reach out to the patient again over the next 30 days.  Date/time of next scheduled Social Work care management/care coordination outreach:  04/03/23 Gus Puma, Kenard Gower, [facility] Health  Managed [facility] Social Worker [phone]

## 2023-03-02 NOTE — ED Notes (Signed)
Pt given a sandwich tray and ginger ale.

## 2023-03-02 NOTE — Discharge Instructions (Addendum)
You were given albumin today after recent paracentesis.  Please continue your scheduled outpatient paracentesis procedures.  Follow-up with your outpatient team as directed.  Return to the ER for new or worsening symptoms.

## 2023-03-02 NOTE — Patient Instructions (Signed)
Visit Information  [name] was given information about Medicaid Managed Care team care coordination services as a part of their Healthy Blue Medicaid benefit. Tom [name] verbally consented to engagement with the [facility] Managed Care team.   If you are experiencing a medical emergency, please call 911 or report to your local emergency department or urgent care.   If you have a non-emergency medical problem during routine business hours, please contact your provider's office and ask to speak with a nurse.   For questions related to your Healthy [facility] health plan, please call: [phone] or visit the homepage here: [email or web address]  If you would like to schedule transportation through your Healthy [facility] plan, please call the following number at least 2 days in advance of your appointment: [phone]  For information about your ride after you set it up, call Ride Assist at [phone]. Use this number to activate a Will Call pickup, or if your transportation is late for a scheduled pickup. Use this number, too, if you need to make a change or cancel a previously scheduled reservation.  If you need transportation services right away, call [phone]. The after-hours call center is staffed 24 hours to handle ride assistance and urgent reservation requests (including discharges) 365 days a year. Urgent trips include sick visits, hospital discharge requests and life-sustaining treatment.  Call the [facility] Line at [phone], at any time, 24 hours a day, 7 days a week. If you are in danger or need immediate medical attention call 911.  If you would like help to quit smoking, call 1-800-QUIT-NOW (623-474-3401) OR Espaol: 1-855-Djelo-Ya (4-742-595-6387) o para ms informacin haga clic aqu or Text READY to 564-332 to register via text  Tom [name] - following are the goals we discussed in your  visit today:   Goals Addressed   None     Social Worker will follow up on 04/03/23.   Gus Puma, Kenard Gower, [facility] Health  Managed Medicaid Social Worker [phone]   Following is a copy of your plan of care:  There are no care plans that you recently modified to display for this patient.

## 2023-03-02 NOTE — ED Provider Notes (Signed)
[facility] Provider Note    Event Date/Time   First MD Initiated Contact with Patient 03/02/23 0935     (approximate)   History   Weakness   HPI  Tom [name] is a 57 year old male with history of alcoholic cirrhosis presenting to the ER for evaluation of  weakness. Gets weekly paracenteses for his cirrhosis.  He was supposed to get albumin after his most recent paracentesis on Tuesday, but they had difficulty with IV access and patient left before receiving this.  Had 8 L pulled.  Follows with [facility] liver/transplant team.  Is on home opiates.  Reports he took more than his scheduled dose of medicine so he ran out early and is now having worsening pain.  No fevers or chills.  Reports generalized weakness but says this is not any worse than his baseline.      Physical Exam   Triage Vital Signs: ED Triage Vitals  Encounter Vitals Group     BP 03/02/23 0931 137/86     Systolic BP Percentile --      Diastolic BP Percentile --      Pulse Rate 03/02/23 0931 96     Resp 03/02/23 0931 16     Temp 03/02/23 0931 97.6 F (36.4 C)     Temp Source 03/02/23 0931 Oral     SpO2 03/02/23 0931 100 %     Weight 03/02/23 0932 213 lb (96.6 kg)     Height 03/02/23 0932 6' (1.829 m)     Head Circumference --      Peak Flow --      Pain Score 03/02/23 0931 10     Pain Loc --      Pain Education --      Exclude from Growth Chart --     Most recent vital signs: Vitals:   03/02/23 1130 03/02/23 1145  BP: 127/73   Pulse: 84 84  Resp: 15 18  Temp:    SpO2: 100% 100%     General: Awake, interactive  CV:  Regular rate, good peripheral perfusion.  Resp:  Lungs clear, unlabored respirations.  Abd:  Soft, distended with fluid wave, nontender to palpation  Neuro:  Symmetric facial movement, fluid speech   ED Results / Procedures / Treatments   Labs (all labs ordered are listed, but only abnormal results are displayed) Labs Reviewed  COMPREHENSIVE  METABOLIC PANEL - Abnormal; Notable for the following components:      Result Value   Sodium 134 (*)    Glucose, Bld 158 (*)    Calcium 8.2 (*)    Total Protein 5.6 (*)    Albumin 2.1 (*)    Total Bilirubin 1.6 (*)    All other components within normal limits  CBC WITH DIFFERENTIAL/PLATELET - Abnormal; Notable for the following components:   RBC 3.89 (*)    Hemoglobin 10.5 (*)    HCT 31.7 (*)    RDW 15.8 (*)    Monocytes Absolute 1.1 (*)    Basophils Absolute 0.2 (*)    All other components within normal limits     EKG EKG independently reviewed interpreted by myself (ER attending) demonstrates:    RADIOLOGY Imaging independently reviewed and interpreted by myself demonstrates:    PROCEDURES:  Critical Care performed: No  Procedures   MEDICATIONS ORDERED IN ED: Medications  oxyCODONE (Oxy IR/ROXICODONE) immediate release tablet 10 mg (has no administration in time range)  oxyCODONE (Oxy IR/ROXICODONE) immediate release tablet 10  mg (10 mg Oral Given 03/02/23 0955)  albumin human 25 % solution 50 g (0 g Intravenous Stopped 03/02/23 1309)     IMPRESSION / MDM / ASSESSMENT AND PLAN / ED COURSE  I reviewed the triage vital signs and the nursing notes.  Differential diagnosis includes, but is not limited to anemia, electrolyte abnormality, fluid shifts in the setting of recent paracentesis, generalized weakness in setting of chronic medical conditions  Patient's presentation is most consistent with acute presentation with potential threat to life or bodily function.  57 year old male presenting to the emergency department for ongoing weakness, did recently leave prior to receiving appropriate albumin after large-volume paracentesis.  Reviewed with pharmacist, no specific recommendations for dosing change given 2 days since procedure.  Ordered for 50 g of albumin.  Will order some basic labs and a dose of patient's home pain medication.  Did discuss that I cannot fill a  prescription for this and he needs to speak with his outpatient doctor and patient expressed understanding.  Labs without significant change from prior.  Tolerating p.o. here.  Will plan for discharge when patient's albumin has finished infusing.  Patient reevaluated following completion of his albumin.  He is still comfortable with plan for discharge.  He will follow-up as an outpatient for further evaluation.  Strict return precautions provided.  Patient discharged in stable condition.      FINAL CLINICAL IMPRESSION(S) / ED DIAGNOSES   Final diagnoses:  Generalized weakness  Ascites due to alcoholic cirrhosis (HCC)     Rx / DC Orders   ED Discharge Orders     None        Note:  This document was prepared using Dragon voice recognition software and may include unintentional dictation errors.   Trinna Post, MD 03/02/23 1324

## 2023-03-03 MED ORDER — ROPINIROLE HCL 0.5 MG PO TABS
ORAL_TABLET | ORAL | 0 refills | Status: DC
Start: 2023-03-03 — End: 2023-03-27

## 2023-03-04 LAB — MISC LABCORP TEST (SEND OUT): Labcorp test code: 791584

## 2023-03-06 ENCOUNTER — Telehealth: Payer: Self-pay | Admitting: Family Medicine

## 2023-03-06 ENCOUNTER — Ambulatory Visit
Admission: RE | Admit: 2023-03-06 | Discharge: 2023-03-06 | Disposition: A | Discharge status: Home / Self Care | Payer: Medicaid Other | Source: Ambulatory Visit | Attending: Transplant Hepatology | Admitting: Transplant Hepatology

## 2023-03-06 ENCOUNTER — Other Ambulatory Visit: Payer: Self-pay | Admitting: Family Medicine

## 2023-03-06 ENCOUNTER — Ambulatory Visit: Payer: Medicaid Other

## 2023-03-06 DIAGNOSIS — G8929 Other chronic pain: Secondary | ICD-10-CM

## 2023-03-06 DIAGNOSIS — K7460 Unspecified cirrhosis of liver: Secondary | ICD-10-CM | POA: Insufficient documentation

## 2023-03-06 DIAGNOSIS — Z515 Encounter for palliative care: Secondary | ICD-10-CM

## 2023-03-06 DIAGNOSIS — T8484XA Pain due to internal orthopedic prosthetic devices, implants and grafts, initial encounter: Secondary | ICD-10-CM

## 2023-03-06 MED ORDER — ALBUMIN HUMAN 25 % IV SOLN: 25.0000 g | Freq: Once | INTRAVENOUS | Status: AC

## 2023-03-06 MED ORDER — ALBUMIN HUMAN 25 % IV SOLN
INTRAVENOUS | Status: AC
  Administered 2023-03-06: 25 g via INTRAVENOUS
  Filled 2023-03-06: qty 100

## 2023-03-06 MED ORDER — LIDOCAINE HCL (PF) 1 % IJ SOLN
10.0000 mL | Freq: Once | INTRAMUSCULAR | Status: AC
  Administered 2023-03-06: 10 mL via INTRADERMAL
  Filled 2023-03-06: qty 10

## 2023-03-06 MED ORDER — OXYCODONE HCL 10 MG PO TABS: 10.0000 mg | ORAL_TABLET | Freq: Four times a day (QID) | ORAL | 0 refills | Status: DC | PRN

## 2023-03-06 NOTE — Procedures (Signed)
PROCEDURE SUMMARY:  Successful image-guided paracentesis from the right lower abdomen.  Yielded 8.3 liters of clear yellow fluid.  No immediate complications.  EBL = trace. Patient tolerated well.   Specimen was not sent for labs.  Please see imaging section of Epic for full dictation.   Kennieth Francois PA-C 03/06/2023 11:38 AM

## 2023-03-06 NOTE — Telephone Encounter (Signed)
Pt is calling in because he wants to know if Dr. Kirtland Bouchard can call up to the hospital so he can get a pain pill. Please advise.

## 2023-03-06 NOTE — Telephone Encounter (Signed)
Pt wants Dr Kirtland Bouchard to know he is completely out of his oxycodone, and hopes to get soon.

## 2023-03-06 NOTE — Telephone Encounter (Signed)
Please let him know that I just signed the order.  The pharmacy refill request did not even make it to me that fast, but I have gone into the chart and signed the order already  Saralyn Pilar, DO [facility] 03/06/2023, 3:02 PM

## 2023-03-06 NOTE — Telephone Encounter (Signed)
Medication Refill - Medication: Oxycodone HCl 10 MG TABS   Has the patient contacted their pharmacy? No. (Agent: If no, request that the patient contact the pharmacy for the refill. If patient does not wish to contact the pharmacy document the reason why and proceed with request.)   Preferred Pharmacy (with phone number or street name):  [id number] [place] [place] - [address]  [address] [place] [address]  Phone: [phone] Fax: [phone]  Hours: Not open 24 hours   Has the patient been seen for an appointment in the last year OR does the patient have an upcoming appointment? Yes.    Agent: Please be advised that RX refills may take up to 3 business days. We ask that you follow-up with your pharmacy.

## 2023-03-07 DIAGNOSIS — K5730 Diverticulosis of large intestine without perforation or abscess without bleeding: Secondary | ICD-10-CM | POA: Diagnosis not present

## 2023-03-07 DIAGNOSIS — Z86010 Personal history of colonic polyps: Secondary | ICD-10-CM | POA: Diagnosis not present

## 2023-03-07 DIAGNOSIS — I2510 Atherosclerotic heart disease of native coronary artery without angina pectoris: Secondary | ICD-10-CM | POA: Diagnosis not present

## 2023-03-07 DIAGNOSIS — Z79899 Other long term (current) drug therapy: Secondary | ICD-10-CM | POA: Diagnosis not present

## 2023-03-07 DIAGNOSIS — K31819 Angiodysplasia of stomach and duodenum without bleeding: Secondary | ICD-10-CM | POA: Diagnosis not present

## 2023-03-07 DIAGNOSIS — Z1211 Encounter for screening for malignant neoplasm of colon: Secondary | ICD-10-CM | POA: Diagnosis not present

## 2023-03-07 DIAGNOSIS — K3189 Other diseases of stomach and duodenum: Secondary | ICD-10-CM | POA: Diagnosis not present

## 2023-03-07 DIAGNOSIS — I10 Essential (primary) hypertension: Secondary | ICD-10-CM | POA: Diagnosis not present

## 2023-03-07 DIAGNOSIS — K635 Polyp of colon: Secondary | ICD-10-CM | POA: Diagnosis not present

## 2023-03-07 DIAGNOSIS — D125 Benign neoplasm of sigmoid colon: Secondary | ICD-10-CM | POA: Diagnosis not present

## 2023-03-07 DIAGNOSIS — J449 Chronic obstructive pulmonary disease, unspecified: Secondary | ICD-10-CM | POA: Diagnosis not present

## 2023-03-07 DIAGNOSIS — I8510 Secondary esophageal varices without bleeding: Secondary | ICD-10-CM | POA: Diagnosis not present

## 2023-03-07 DIAGNOSIS — I8500 Esophageal varices without bleeding: Secondary | ICD-10-CM | POA: Diagnosis not present

## 2023-03-07 DIAGNOSIS — K766 Portal hypertension: Secondary | ICD-10-CM | POA: Diagnosis not present

## 2023-03-07 DIAGNOSIS — D124 Benign neoplasm of descending colon: Secondary | ICD-10-CM | POA: Diagnosis not present

## 2023-03-07 DIAGNOSIS — Z87891 Personal history of nicotine dependence: Secondary | ICD-10-CM | POA: Diagnosis not present

## 2023-03-07 DIAGNOSIS — K640 First degree hemorrhoids: Secondary | ICD-10-CM | POA: Diagnosis not present

## 2023-03-07 DIAGNOSIS — Z6830 Body mass index (BMI) 30.0-30.9, adult: Secondary | ICD-10-CM | POA: Diagnosis not present

## 2023-03-07 DIAGNOSIS — D120 Benign neoplasm of cecum: Secondary | ICD-10-CM | POA: Diagnosis not present

## 2023-03-07 DIAGNOSIS — E669 Obesity, unspecified: Secondary | ICD-10-CM | POA: Diagnosis not present

## 2023-03-08 LAB — HM COLONOSCOPY

## 2023-03-09 ENCOUNTER — Ambulatory Visit: Payer: Medicaid Other

## 2023-03-10 ENCOUNTER — Other Ambulatory Visit: Payer: Self-pay | Admitting: Transplant Hepatology

## 2023-03-10 ENCOUNTER — Ambulatory Visit: Payer: Self-pay

## 2023-03-10 DIAGNOSIS — K7460 Unspecified cirrhosis of liver: Secondary | ICD-10-CM

## 2023-03-10 NOTE — Telephone Encounter (Signed)
Reason for Disposition  [1] Scab or sore is present AND [2] it drains pus or increases in size AND [3] not improved after applying antibiotic ointment for 2 days  Answer Assessment - Initial Assessment Questions 1. SEVERITY: "How many stings are there?"     I was stung by fire ants on Wed.   I have cellulitis from the surgery I had.   The fire ant bites burn.  I have blisters all over my feet and they are draining.   I don't want to go to the ED.     I got people on the way now to get rid of the fire ants. 2. ONSET: "When did it occur?"      Wed. 3. LOCATION: "Where is the sting located?"  "How many stings?"     My feet are where I was stung.   I have cellulitis on both of my legs.   Yes  4. SWELLING: "How big is the swelling?" (e.g., inches or cm)     Both feet 5. REDNESS: "Is the area red or pink?" If Yes, ask: "What size is the area of redness?" (e.g., inches or cm). "When did the redness start?"     Yes I did my colonoscopy and it was fine. 6. PAIN: "Is there any pain?" If Yes, ask: "How bad is it?"  (Scale 1-10; or mild, moderate, severe)     I'm in constant pain.   I'm on oxycodone. 7. ITCHING: "Is there any itching?" If Yes, ask: "How bad is it?"      No 8. RESPIRATORY DISTRESS: "Describe your breathing."     Not asked 9. PRIOR REACTIONS: "Have you had any severe allergic reactions to stings in the past?" If Yes, ask: "What happened?"     I've been stung by fire ants before. 10. OTHER SYMPTOMS: "Do you have any other symptoms?" (e.g., abdomen pain, face or tongue swelling, new rash elsewhere, vomiting)       The bites are draining fluid.   He wants an RSV and tetanus shot before his liver transplant.    11. PREGNANCY: "Is there any chance you are pregnant?" "When was your last menstrual period?"       N/A  Protocols used: [employer]  Chief Complaint: Got stung by [employer] on Wed. And now his legs are swollen and the bites are draining fluid.   He also wants his RSV and  tetanus shots. Symptoms: The bites are itching and burning and draining on both feet. Frequency: Since Wed. When he got stung by the fire ants Pertinent Negatives: Patient denies Doing any thing to them except keeping them clean Disposition: [] ED /[] Urgent Care (no appt availability in office) / [x] Appointment(In office/virtual)/ []  [place] [facility]/ [] Home Care/ [] Refused Recommended Disposition /[] [place] [facility]/ []  Follow-up with PCP Additional Notes: Appt made with Dr. Althea Charon for 03/27/2023 at 3:00.   Sending a message to see if he can be worked in sooner.

## 2023-03-10 NOTE — Telephone Encounter (Signed)
Patient called, left VM to return the call to the office to speak to the NT.   Summary: stung by fire ants   Patient called stated he was stung by fire ants on Wednesday and now puss is oozing from his bumps. He also wants to get his tetanus and RSV shot so she can get his liver transplant

## 2023-03-13 ENCOUNTER — Ambulatory Visit
Admission: RE | Admit: 2023-03-13 | Discharge: 2023-03-13 | Disposition: A | Discharge status: Home / Self Care | Payer: Medicaid Other | Source: Ambulatory Visit | Attending: Transplant Hepatology | Admitting: Transplant Hepatology

## 2023-03-13 DIAGNOSIS — K7460 Unspecified cirrhosis of liver: Secondary | ICD-10-CM | POA: Insufficient documentation

## 2023-03-13 MED ORDER — ALBUMIN HUMAN 25 % IV SOLN
INTRAVENOUS | Status: AC
  Filled 2023-03-13: qty 100

## 2023-03-13 MED ORDER — LIDOCAINE HCL (PF) 1 % IJ SOLN
10.0000 mL | Freq: Once | INTRAMUSCULAR | Status: AC
  Administered 2023-03-13: 10 mL via INTRADERMAL
  Filled 2023-03-13: qty 10

## 2023-03-13 MED ORDER — ALBUMIN HUMAN 25 % IV SOLN
25.0000 g | Freq: Once | INTRAVENOUS | Status: AC
  Administered 2023-03-13: 25 g via INTRAVENOUS

## 2023-03-13 NOTE — Procedures (Signed)
PROCEDURE SUMMARY:  Successful image-guided paracentesis from the right lower abdomen.  Yielded 6 liters of clear yellow fluid.  No immediate complications.  EBL = trace. Patient tolerated well.   Specimen was not sent for labs.  Please see imaging section of Epic for full dictation.   Kennieth Francois PA-C 03/13/2023 11:39 AM

## 2023-03-15 ENCOUNTER — Emergency Department
Admission: EM | Admit: 2023-03-15 | Discharge: 2023-03-15 | Disposition: A | Discharge status: Home / Self Care | Payer: Medicaid Other | Attending: Emergency Medicine | Admitting: Emergency Medicine

## 2023-03-15 ENCOUNTER — Other Ambulatory Visit: Payer: Self-pay

## 2023-03-15 DIAGNOSIS — R109 Unspecified abdominal pain: Secondary | ICD-10-CM | POA: Diagnosis present

## 2023-03-15 DIAGNOSIS — R1084 Generalized abdominal pain: Secondary | ICD-10-CM | POA: Insufficient documentation

## 2023-03-15 DIAGNOSIS — I10 Essential (primary) hypertension: Secondary | ICD-10-CM | POA: Diagnosis not present

## 2023-03-15 LAB — COMPREHENSIVE METABOLIC PANEL
ALT: 14 U/L (ref 0–44)
AST: 28 U/L (ref 15–41)
Albumin: 2.4 g/dL — ABNORMAL LOW (ref 3.5–5.0)
Alkaline Phosphatase: 65 U/L (ref 38–126)
Anion gap: 5 (ref 5–15)
BUN: 13 mg/dL (ref 6–20)
CO2: 23 mmol/L (ref 22–32)
Calcium: 8.2 mg/dL — ABNORMAL LOW (ref 8.9–10.3)
Chloride: 106 mmol/L (ref 98–111)
Creatinine, Ser: 1.12 mg/dL (ref 0.61–1.24)
GFR, Estimated: >60 mL/min (ref >60)
Glucose, Bld: 152 mg/dL — ABNORMAL HIGH (ref 70–99)
Potassium: 3.4 mmol/L — ABNORMAL LOW (ref 3.5–5.1)
Sodium: 134 mmol/L — ABNORMAL LOW (ref 135–145)
Total Bilirubin: 1.8 mg/dL — ABNORMAL HIGH (ref 0.3–1.2)
Total Protein: 5.4 g/dL — ABNORMAL LOW (ref 6.5–8.1)

## 2023-03-15 LAB — BODY FLUID CELL COUNT WITH DIFFERENTIAL
Eos, Fluid: 2 %
Lymphs, Fluid: 56 %
Monocyte-Macrophage-Serous Fluid: 39 %
Neutrophil Count, Fluid: 3 %
Total Nucleated Cell Count, Fluid: 72 cu mm

## 2023-03-15 LAB — CBC
HCT: 30.1 % — ABNORMAL LOW (ref 39.0–52.0)
Hemoglobin: 9.7 g/dL — ABNORMAL LOW (ref 13.0–17.0)
MCH: 26.8 pg (ref 26.0–34.0)
MCHC: 32.2 g/dL (ref 30.0–36.0)
MCV: 83.1 fL (ref 80.0–100.0)
Platelets: 233 10*3/uL (ref 150–400)
RBC: 3.62 MIL/uL — ABNORMAL LOW (ref 4.22–5.81)
RDW: 17 % — ABNORMAL HIGH (ref 11.5–15.5)
WBC: 6.8 10*3/uL (ref 4.0–10.5)
nRBC: 0 % (ref 0.0–0.2)

## 2023-03-15 LAB — LIPASE, BLOOD: Lipase: 26 U/L (ref 11–51)

## 2023-03-15 LAB — PATHOLOGIST SMEAR REVIEW

## 2023-03-15 MED ORDER — ONDANSETRON HCL 4 MG/2ML IJ SOLN: 4.0000 mg | Freq: Once | INTRAMUSCULAR | Status: DC

## 2023-03-15 MED ORDER — METOCLOPRAMIDE HCL 5 MG/ML IJ SOLN
10.0000 mg | Freq: Once | INTRAMUSCULAR | Status: AC
  Administered 2023-03-15: 10 mg via INTRAVENOUS
  Filled 2023-03-15: qty 2

## 2023-03-15 MED ORDER — MORPHINE SULFATE (PF) 4 MG/ML IV SOLN
4.0000 mg | Freq: Once | INTRAVENOUS | Status: AC
  Administered 2023-03-15: 4 mg via INTRAVENOUS
  Filled 2023-03-15: qty 1

## 2023-03-15 MED ORDER — OXYCODONE HCL 5 MG PO TABS
10.0000 mg | ORAL_TABLET | Freq: Once | ORAL | Status: AC
  Administered 2023-03-15: 10 mg via ORAL
  Filled 2023-03-15: qty 2

## 2023-03-15 NOTE — ED Triage Notes (Signed)
Pt to ED for abd pain, emesis since having paracentesis on Monday. Also reports continued right foot pain from procedure. States waiting for liver transplant.  Has cirrhosis.

## 2023-03-15 NOTE — ED Provider Notes (Addendum)
[facility] Provider Note   Event Date/Time   First MD Initiated Contact with Patient 03/15/23 [phone]     (approximate) History  Emesis  HPI Tom [name] is a 57 y.o. male with a past medical history of alcoholic cirrhosis who presents complaining of abdominal pain after a recent paracentesis on 03/13/2023.  Patient states that the swelling in his stomach greatly decreased until 2 days ago when the abdominal pain began to worsen as well as swelling in his abdomen.  Patient now complains of nausea and vomiting with associated generalized abdominal pain that is worse with any movement or jostling in the car. ROS: Patient currently denies any vision changes, tinnitus, difficulty speaking, facial droop, sore throat, chest pain, shortness of breath, diarrhea, dysuria, or weakness/numbness/paresthesias in any extremity   Physical Exam  Triage Vital Signs: ED Triage Vitals [03/15/23 0811]  Encounter Vitals Group     BP 131/78     Systolic BP Percentile      Diastolic BP Percentile      Pulse Rate 71     Resp 18     Temp 97.7 F (36.5 C)     Temp src      SpO2 100 %     Weight 220 lb (99.8 kg)     Height 5\' 11"  (1.803 m)     Head Circumference      Peak Flow      Pain Score 10     Pain Loc      Pain Education      Exclude from Growth Chart    Most recent vital signs: Vitals:   03/15/23 1030 03/15/23 1206  BP: (!) 122/102   Pulse: 71   Resp:    Temp:  97.8 F (36.6 C)  SpO2: 99%    General: Awake, oriented x4. CV:  Good peripheral perfusion.  Resp:  Normal effort.  Abd:  Distended with fluid wave.  Paracentesis site to right lower quadrant without any surrounding erythema.  Generalized tenderness to palpation Other:  Middle-aged well-developed, overweight Caucasian male resting comfortably in no acute distress ED Results / Procedures / Treatments  Labs (all labs ordered are listed, but only abnormal results are displayed) Labs Reviewed  CBC  - Abnormal; Notable for the following components:      Result Value   RBC 3.62 (*)    Hemoglobin 9.7 (*)    HCT 30.1 (*)    RDW 17.0 (*)    All other components within normal limits  COMPREHENSIVE METABOLIC PANEL - Abnormal; Notable for the following components:   Sodium 134 (*)    Potassium 3.4 (*)    Glucose, Bld 152 (*)    Calcium 8.2 (*)    Total Protein 5.4 (*)    Albumin 2.4 (*)    Total Bilirubin 1.8 (*)    All other components within normal limits  BODY FLUID CELL COUNT WITH DIFFERENTIAL - Abnormal; Notable for the following components:   Appearance, Fluid HAZY (*)    All other components within normal limits  BODY FLUID CULTURE W GRAM STAIN  LIPASE, BLOOD  PATHOLOGIST SMEAR REVIEW  PROCEDURES: Critical Care performed: No .1-3 Lead EKG Interpretation  Performed by: Merwyn Katos, MD Authorized by: Merwyn Katos, MD     Interpretation: normal     ECG rate:  71   ECG rate assessment: normal     Rhythm: sinus rhythm     Ectopy: none  Conduction: normal    MEDICATIONS ORDERED IN ED: Medications  morphine (PF) 4 MG/ML injection 4 mg (4 mg Intravenous Given 03/15/23 0904)  metoCLOPramide (REGLAN) injection 10 mg (10 mg Intravenous Given 03/15/23 0904)  oxyCODONE (Oxy IR/ROXICODONE) immediate release tablet 10 mg (10 mg Oral Given 03/15/23 0951)   IMPRESSION / MDM / ASSESSMENT AND PLAN / ED COURSE  I reviewed the triage vital signs and the nursing notes.                             The patient is on the cardiac monitor to evaluate for evidence of arrhythmia and/or significant heart rate changes. Patient's presentation is most consistent with acute presentation with potential threat to life or bodily function. Patient presents for abdominal pain.  Differential diagnosis includes appendicitis, abdominal aortic aneurysm, surgical biliary disease, pancreatitis, SBO, mesenteric ischemia, serious intra-abdominal bacterial illness, genital torsion. Doubt atypical  ACS. Based on history, physical exam, radiologic/laboratory evaluation, there is no red flag results or symptomatology requiring emergent intervention or need for admission at this time Pt tolerating PO. Disposition: Patient will be discharged with strict return precautions and follow up with primary MD within 12-24 hours for further evaluation. Patient understands that this still may have an early presentation of an emergent medical condition such as appendicitis that will require a recheck.   FINAL CLINICAL IMPRESSION(S) / ED DIAGNOSES   Final diagnoses:  Generalized abdominal pain   Rx / DC Orders   ED Discharge Orders     None      Note:  This document was prepared using Dragon voice recognition software and may include unintentional dictation errors.   Merwyn Katos, MD 03/15/23 1478    Merwyn Katos, MD 03/15/23 [id number]

## 2023-03-16 ENCOUNTER — Other Ambulatory Visit: Payer: Self-pay | Admitting: Family Medicine

## 2023-03-16 DIAGNOSIS — T8484XA Pain due to internal orthopedic prosthetic devices, implants and grafts, initial encounter: Secondary | ICD-10-CM

## 2023-03-16 DIAGNOSIS — Z515 Encounter for palliative care: Secondary | ICD-10-CM

## 2023-03-16 DIAGNOSIS — G8929 Other chronic pain: Secondary | ICD-10-CM

## 2023-03-16 LAB — BODY FLUID CULTURE W GRAM STAIN

## 2023-03-16 NOTE — Telephone Encounter (Signed)
Medication Refill - Medication: Oxycodone HCl 10 MG TABS   Has the patient contacted their pharmacy? No.   Preferred Pharmacy (with phone number or street name):  [id number] [place] [place] - [address] Phone: [phone]  Fax: [phone]      Has the patient been seen for an appointment in the last year OR does the patient have an upcoming appointment? Yes.    The patient states he still has some left but will be out by Monday. Please assist patient further

## 2023-03-17 ENCOUNTER — Other Ambulatory Visit: Payer: Self-pay | Admitting: Internal Medicine

## 2023-03-17 ENCOUNTER — Other Ambulatory Visit: Payer: Self-pay | Admitting: Family Medicine

## 2023-03-17 MED ORDER — ALBUTEROL SULFATE HFA 108 (90 BASE) MCG/ACT IN AERS: 2.0000 | INHALATION_SPRAY | RESPIRATORY_TRACT | 0 refills | Status: DC | PRN

## 2023-03-17 MED ORDER — THIAMINE HCL 100 MG PO TABS: 100.0000 mg | ORAL_TABLET | Freq: Every day | ORAL | 0 refills | Status: DC

## 2023-03-17 MED ORDER — OXYCODONE HCL 10 MG PO TABS: 10.0000 mg | ORAL_TABLET | Freq: Four times a day (QID) | ORAL | 0 refills | Status: DC | PRN

## 2023-03-17 NOTE — Telephone Encounter (Signed)
Medication Refill - Medication: albuterol (VENTOLIN HFA) 108 (90 Base) MCG/ACT inhaler [[id number]]     Has the patient contacted their pharmacy? Yes.   (Agent: If no, request that the patient contact the pharmacy for the refill. If patient does not wish to contact the pharmacy document the reason why and proceed with request.) (Agent: If yes, when and what did the pharmacy advise?)  Preferred Pharmacy (with phone number or street name): [id number] [place], [place] - [address] [place] [address] Phone: [phone]  Fax: [phone]  Has the patient been seen for an appointment in the last year OR does the patient have an upcoming appointment? Yes.    Agent: Please be advised that RX refills may take up to 3 business days. We ask that you follow-up with your pharmacy.

## 2023-03-17 NOTE — Telephone Encounter (Signed)
Requested Prescriptions  Pending Prescriptions Disp Refills   albuterol (VENTOLIN HFA) 108 (90 Base) MCG/ACT inhaler 8 g 0    Sig: Inhale 2 puffs into the lungs every 4 (four) hours as needed.     Pulmonology:  Beta Agonists 2 Passed - 03/17/2023  2:04 PM      Passed - Last BP in normal range    BP Readings from Last 1 Encounters:  03/15/23 135/81         Passed - Last Heart Rate in normal range    Pulse Readings from Last 1 Encounters:  03/15/23 69         Passed - Valid encounter within last 12 months    Recent Outpatient Visits           3 weeks ago Alcoholic cirrhosis of liver with ascites [facility])   [place] [facility] Althea Charon, [name], DO   1 month ago Hospital discharge follow-up   [place] [facility] Mecum, Oswaldo Conroy, PA-C   3 months ago No-show for appointment   [place] [facility] [name], DO   3 months ago Chronic foot pain, right   [place] [facility] [place], [name], DO   4 months ago Alcoholic cirrhosis of liver with ascites [facility])   [place] [facility] Althea Charon, [name], DO       Future Appointments             In 1 week Althea Charon, [name], DO [place] [facility]   In 2 weeks Althea Charon, [name], DO [place] [facility], [place]

## 2023-03-17 NOTE — Telephone Encounter (Signed)
Requested medications are due for refill today.  yes  Requested medications are on the active medications list.  yes  Last refill. 03/06/2023 #60 0 rf a 15 day supply  Future visit scheduled.   yes  Notes to clinic.  Refill not delegated.    Requested Prescriptions  Pending Prescriptions Disp Refills   Oxycodone HCl 10 MG TABS 60 tablet 0    Sig: Take 1 tablet (10 mg total) by mouth every 6 (six) hours as needed.     Not Delegated - Analgesics:  Opioid Agonists Failed - 03/16/2023  1:28 PM      Failed - This refill cannot be delegated      Failed - Urine Drug Screen completed in last 360 days      Passed - Valid encounter within last 3 months    Recent Outpatient Visits           3 weeks ago Alcoholic cirrhosis of liver with ascites [facility])   [place] [facility] Althea Charon, [name], DO   1 month ago Hospital discharge follow-up   [place] [facility] Mecum, Oswaldo Conroy, PA-C   3 months ago No-show for appointment   [facility] [name], DO   3 months ago Chronic foot pain, right   [place] [facility], [name], DO   4 months ago Alcoholic cirrhosis of liver with ascites [facility])   [place] [facility] Althea Charon, [name], DO       Future Appointments             In 1 week Althea Charon, [name], DO [facility]   In 2 weeks Althea Charon, [name], DO  [facility], [place]

## 2023-03-19 LAB — BODY FLUID CULTURE W GRAM STAIN
Culture: NO GROWTH
Gram Stain: NONE SEEN

## 2023-03-20 ENCOUNTER — Ambulatory Visit
Admission: RE | Admit: 2023-03-20 | Discharge: 2023-03-20 | Disposition: A | Discharge status: Home / Self Care | Payer: Medicaid Other | Source: Ambulatory Visit | Attending: Transplant Hepatology | Admitting: Transplant Hepatology

## 2023-03-20 DIAGNOSIS — K7460 Unspecified cirrhosis of liver: Secondary | ICD-10-CM | POA: Diagnosis not present

## 2023-03-20 MED ORDER — ALBUMIN HUMAN 25 % IV SOLN
INTRAVENOUS | Status: AC
  Filled 2023-03-20: qty 100

## 2023-03-20 MED ORDER — ALBUMIN HUMAN 25 % IV SOLN: 25.0000 g | Freq: Once | INTRAVENOUS | Status: DC

## 2023-03-20 MED ORDER — LIDOCAINE HCL (PF) 1 % IJ SOLN
10.0000 mL | Freq: Once | INTRAMUSCULAR | Status: DC
  Filled 2023-03-20: qty 10

## 2023-03-20 MED ORDER — ALBUMIN HUMAN 25 % IV SOLN
25.0000 g | Freq: Once | INTRAVENOUS | Status: AC
  Administered 2023-03-20: 25 g via INTRAVENOUS
  Filled 2023-03-20: qty 100

## 2023-03-20 NOTE — Procedures (Signed)
PROCEDURE SUMMARY:  Successful ultrasound guided paracentesis from the right lower abdomen.   Yielded 7.2L of clear yellow fluid.  No immediate complications.  The patient tolerated the procedure well.   Specimen was not sent for labs.  EBL trace  See imaging section of Epic for full dictation.    Eliezer Mccoy MD PGY-3 [facility]  03/20/2023 10:51 AM

## 2023-03-20 NOTE — Progress Notes (Signed)
Patient calling out to desk requesting to leave before 2nd albumin is finished he has 40 min left on his infusion.  AMA paperwork brought to bedside I asked him why he did not want to finish treatment and he said he just wanted to go home and lay down.  I told him he must sign paperwork before I would take out iv and he said he would stick it out requesting additional chips/crackers/etc.  NT provided patient with pack of crackers

## 2023-03-23 ENCOUNTER — Other Ambulatory Visit: Payer: Self-pay | Admitting: Family Medicine

## 2023-03-23 DIAGNOSIS — R112 Nausea with vomiting, unspecified: Secondary | ICD-10-CM

## 2023-03-23 DIAGNOSIS — K7031 Alcoholic cirrhosis of liver with ascites: Secondary | ICD-10-CM

## 2023-03-23 MED ORDER — SPIRONOLACTONE 25 MG PO TABS
50.0000 mg | ORAL_TABLET | Freq: Every day | ORAL | 5 refills | Status: DC
Start: 2023-03-23 — End: 2023-06-02

## 2023-03-23 MED ORDER — ONDANSETRON 4 MG PO TBDP
4.0000 mg | ORAL_TABLET | Freq: Three times a day (TID) | ORAL | 5 refills | Status: DC | PRN
Start: 2023-03-23 — End: 2023-06-02

## 2023-03-23 NOTE — Telephone Encounter (Addendum)
Medication Refill - Medication:  spironolactone (ALDACTONE) 25 MG tablet   ondansetron (ZOFRAN-ODT) 4 MG disintegrating tablet   *not completely out yet but getting low  Has the patient contacted their pharmacy?  Yes. Advised to contact PCP   Preferred Pharmacy (with phone number or street name):  [id number] [place], [place] - [address] Phone: [phone]  Fax: [phone]      Has the patient been seen for an appointment in the last year OR does the patient have an upcoming appointment? Yes, F/U on 03/27/23 with PCP

## 2023-03-23 NOTE — Telephone Encounter (Signed)
Requested medications are due for refill today.  yes  Requested medications are on the active medications list.  yes  Last refill. 02/09/2023 for Zofran, 10/20/2022 For aldactone  Future visit scheduled.   yes  Notes to clinic.  Zofran is not delegated. Aldactone is historical.    Requested Prescriptions  Pending Prescriptions Disp Refills   spironolactone (ALDACTONE) 25 MG tablet 30 tablet 5    Sig: Take 2 tablets (50 mg total) by mouth daily.     Cardiovascular: Diuretics - Aldosterone Antagonist Failed - 03/23/2023  8:46 AM      Failed - K in normal range and within 180 days    Potassium  Date Value Ref Range Status  03/15/2023 3.4 (L) 3.5 - 5.1 mmol/L Final         Failed - Na in normal range and within 180 days    Sodium  Date Value Ref Range Status  03/15/2023 134 (L) 135 - 145 mmol/L Final         Passed - Cr in normal range and within 180 days    Creat  Date Value Ref Range Status  09/21/2022 0.81 0.70 - 1.30 mg/dL Final   Creatinine, Ser  Date Value Ref Range Status  03/15/2023 1.12 0.61 - 1.24 mg/dL Final         Passed - eGFR is 30 or above and within 180 days    GFR, Estimated  Date Value Ref Range Status  03/15/2023 >60 >60 mL/min Final    Comment:    (NOTE) Calculated using the CKD-EPI Creatinine Equation (2021)    eGFR  Date Value Ref Range Status  09/21/2022 102 > OR = 60 mL/min/1.36m2 Final         Passed - Last BP in normal range    BP Readings from Last 1 Encounters:  03/20/23 115/62         Passed - Valid encounter within last 6 months    Recent Outpatient Visits           4 weeks ago Alcoholic cirrhosis of liver with ascites [facility])   [place] [facility] [name], DO   2 months ago Hospital discharge follow-up   [place] [facility] Mecum, Oswaldo Conroy, PA-C   3 months ago No-show for appointment   [place] [name], DO   4 months ago  Chronic foot pain, right   [place] [facility] [place], [name], DO   5 months ago Alcoholic cirrhosis of liver with ascites [facility])   [place] [facility] Althea Charon, [name], DO       Future Appointments             In 4 days Althea Charon, [name], DO [place] [facility]   In 2 weeks Althea Charon, [name], DO [place] [facility]             ondansetron (ZOFRAN-ODT) 4 MG disintegrating tablet 30 tablet 2    Sig: Take 1 tablet (4 mg total) by mouth every 8 (eight) hours as needed for nausea or vomiting.     Not Delegated - Gastroenterology: Antiemetics - ondansetron Failed - 03/23/2023  8:46 AM      Failed - This refill cannot be delegated      Passed - AST in normal range and within 360 days    AST  Date Value Ref Range Status  03/15/2023 28 15 - 41 U/L Final         Passed - ALT in normal range and within 360 days    ALT  Date Value Ref Range Status  03/15/2023 14 0 - 44 U/L Final         Passed - Valid encounter within last 6 months    Recent Outpatient Visits           4 weeks ago Alcoholic cirrhosis of liver with ascites [facility])   [place] [facility] Althea Charon, [name], DO   2 months ago Hospital discharge follow-up   [place] [facility] Mecum, Oswaldo Conroy, PA-C   3 months ago No-show for appointment   [facility] [name], DO   4 months ago Chronic foot pain, right   [place] [facility] [place], [name], DO   5 months ago Alcoholic cirrhosis of liver with ascites [facility])   [place] [facility] Althea Charon, [name], DO       Future Appointments             In 4 days Althea Charon, [name], DO [place] [facility], [facility]   In 2 weeks Althea Charon, [name], DO [place] [facility],  [place]

## 2023-03-27 ENCOUNTER — Ambulatory Visit: Payer: Medicaid Other | Admitting: Family Medicine

## 2023-03-27 ENCOUNTER — Encounter: Payer: Self-pay | Admitting: Family Medicine

## 2023-03-27 ENCOUNTER — Ambulatory Visit
Admission: RE | Admit: 2023-03-27 | Discharge: 2023-03-27 | Disposition: A | Discharge status: Home / Self Care | Payer: Medicaid Other | Source: Ambulatory Visit | Attending: Transplant Hepatology | Admitting: Transplant Hepatology

## 2023-03-27 VITALS — BP 112/50 | HR 96 | Resp 16

## 2023-03-27 DIAGNOSIS — T8484XA Pain due to internal orthopedic prosthetic devices, implants and grafts, initial encounter: Secondary | ICD-10-CM

## 2023-03-27 DIAGNOSIS — G2581 Restless legs syndrome: Secondary | ICD-10-CM

## 2023-03-27 DIAGNOSIS — K7031 Alcoholic cirrhosis of liver with ascites: Secondary | ICD-10-CM | POA: Diagnosis not present

## 2023-03-27 DIAGNOSIS — Z515 Encounter for palliative care: Secondary | ICD-10-CM

## 2023-03-27 DIAGNOSIS — Z7682 Awaiting organ transplant status: Secondary | ICD-10-CM

## 2023-03-27 DIAGNOSIS — G8929 Other chronic pain: Secondary | ICD-10-CM

## 2023-03-27 DIAGNOSIS — K7460 Unspecified cirrhosis of liver: Secondary | ICD-10-CM | POA: Diagnosis present

## 2023-03-27 DIAGNOSIS — Z2911 Encounter for prophylactic immunotherapy for respiratory syncytial virus (RSV): Secondary | ICD-10-CM

## 2023-03-27 DIAGNOSIS — M79671 Pain in right foot: Secondary | ICD-10-CM

## 2023-03-27 MED ORDER — RSVPREF3 VAC RECOMB ADJUVANTED 120 MCG/0.5ML IM SUSR
0.5000 mL | Freq: Once | INTRAMUSCULAR | 0 refills | Status: AC
Start: 2023-03-27 — End: 2023-03-27

## 2023-03-27 MED ORDER — PRAMIPEXOLE DIHYDROCHLORIDE 0.5 MG PO TABS
ORAL_TABLET | ORAL | 2 refills | Status: DC
Start: 2023-03-27 — End: 2023-07-14

## 2023-03-27 MED ORDER — LIDOCAINE HCL (PF) 1 % IJ SOLN
10.0000 mL | Freq: Once | INTRAMUSCULAR | Status: AC
  Administered 2023-03-27: 10 mL via INTRADERMAL
  Filled 2023-03-27: qty 10

## 2023-03-27 MED ORDER — OXYCODONE HCL 10 MG PO TABS
10.0000 mg | ORAL_TABLET | Freq: Four times a day (QID) | ORAL | 0 refills | Status: DC | PRN
Start: 2023-04-03 — End: 2023-04-06

## 2023-03-27 MED ORDER — ALBUMIN HUMAN 25 % IV SOLN
INTRAVENOUS | Status: AC
  Filled 2023-03-27: qty 100

## 2023-03-27 MED ORDER — ALBUMIN HUMAN 25 % IV SOLN
25.0000 g | Freq: Once | INTRAVENOUS | Status: AC
  Administered 2023-03-27: 25 g via INTRAVENOUS

## 2023-03-27 NOTE — Progress Notes (Signed)
Subjective:    Patient ID: Tom [name], male    DOB: October 04, 1965, 57 y.o.   MRN: [id number]  Tom [name] is a 57 y.o. male presenting on 03/27/2023 for Medical Management of Chronic Issues (Needs rx for RSV to get on transplant list. Had fluid taken off foot/leg today)   HPI  Discussed the use of AI scribe software for clinical note transcription with the patient, who gave verbal consent to proceed.     Alcoholic Cirrhosis Need Liver Transplant  Requesting RSV Vaccine rx today for pharmacy. He is age 67 but requesting to have RSV vaccine to be eligible for Liver Transplant.  Chronic Foot Pain / RLS / Painful Orthopedic Hardware  Failed Ropinrole due to hand tremors Requesting new medication, prefers non drowsy if possible and no tremors  He is following with Orthopedics and various specialist on this and related issue. He says concern with if needs to have foot amputated in future if this impacts his liver transplant.  On Oxycodone 10mg  AS NEEDED for managing his pain. Due next week.    Cannot take Tizanidine due to side effect of sedation feeling groggy in AM       02/22/2023    2:40 PM 03/21/2022    2:56 PM 12/16/2020    3:16 PM  Depression screen PHQ 2/9  Decreased Interest 0 0 0  Down, Depressed, Hopeless 0 0 0  PHQ - 2 Score 0 0 0  Altered sleeping  0 0  Tired, decreased energy  0 0  Change in appetite  0 0  Feeling bad or failure about yourself   0 0  Trouble concentrating  0 0  Moving slowly or fidgety/restless  0 0  Suicidal thoughts  0 0  PHQ-9 Score  0 0  Difficult doing work/chores  Not difficult at all Not difficult at all    Social History   Tobacco Use   Smoking status: Former    Current packs/day: 0.00    Average packs/day: 1 pack/day for 39.0 years (39.0 ttl pk-yrs)    Types: Cigarettes    Start date: 12/1976    Quit date: 12/2015    Years since quitting: 7.2   Smokeless tobacco: Never  Vaping Use   Vaping status: Never  Used  Substance Use Topics   Alcohol use: No   Drug use: Yes    Types: Marijuana    Review of Systems Per HPI unless specifically indicated above     Objective:    BP (!) 112/50   Pulse 96   Resp 16   SpO2 96%   Wt Readings from Last 3 Encounters:  03/15/23 220 lb (99.8 kg)  03/02/23 213 lb (96.6 kg)  02/22/23 217 lb (98.4 kg)    Physical Exam Vitals and nursing note reviewed.  Constitutional:      General: He is not in acute distress.    Appearance: Normal appearance. He is well-developed. He is not diaphoretic.     Comments: Chronically ill appearing, thin with weight loss, uncomfortable, cooperative  HENT:     Head: Normocephalic and atraumatic.  Eyes:     General:        Right eye: No discharge.        Left eye: No discharge.     Conjunctiva/sclera: Conjunctivae normal.  Cardiovascular:     Rate and Rhythm: Normal rate.  Pulmonary:     Effort: Pulmonary effort is normal.  Musculoskeletal:     Comments: In  wheelchair, RLE in boot and wrap  Skin:    General: Skin is warm and dry.     Findings: No erythema or rash.  Neurological:     Mental Status: He is alert and oriented to person, place, and time.  Psychiatric:        Mood and Affect: Mood normal.        Behavior: Behavior normal.        Thought Content: Thought content normal.     Comments: Well groomed, good eye contact, normal speech and thoughts    Results for orders placed or performed in visit on 03/27/23  HM COLONOSCOPY  Result Value Ref Range   HM Colonoscopy See Report (in chart) See Report (in chart), Patient Reported      Assessment & Plan:   Problem List Items Addressed This Visit     Alcoholic cirrhosis of liver with ascites (HCC) - Primary   Relevant Medications   RSV vaccine recomb adjuvanted (AREXVY) 120 MCG/0.5ML injection   Other Visit Diagnoses     Need for RSV vaccination       Relevant Medications   RSV vaccine recomb adjuvanted (AREXVY) 120 MCG/0.5ML injection   Liver  transplant candidate       Relevant Medications   RSV vaccine recomb adjuvanted (AREXVY) 120 MCG/0.5ML injection   Restless legs syndrome (RLS)       Relevant Medications   pramipexole (MIRAPEX) 0.5 MG tablet   Painful orthopaedic hardware (HCC)       Relevant Medications   Oxycodone HCl 10 MG TABS (Start on 04/03/2023)   Palliative care patient       Relevant Medications   Oxycodone HCl 10 MG TABS (Start on 04/03/2023)   Chronic foot pain, right       Relevant Medications   Oxycodone HCl 10 MG TABS (Start on 04/03/2023)      Assessment and Plan    Restless Leg Syndrome Patient reports tremors with Ropinirole and sedation with Tizanidine. -Discontinue Ropinirole and Tizanidine. -Start Mirapex 0.25mg  (HALF of 0.5mg  tab) nightly 2 hours before bed. If not effective, can increase to (1 tab) 0.5mg  nightly.  RSV Vaccination Patient requires RSV vaccination for liver transplant eligibility, age 40, he is cleared to proceed with vaccine, printed vaccine order for pharmacy  Chronic Pain Palliative Care -Plan to refill pain medication Oxycodone prescription for pickup next Monday 10/14  Cirrhosis of the Liver Patient mentions having cirrhosis of the liver and potential liver transplant in the future. No immediate concerns or changes in condition reported. -Continue current management and monitoring.   Meds ordered this encounter  Medications   RSV vaccine recomb adjuvanted (AREXVY) 120 MCG/0.5ML injection    Sig: Inject 0.5 mLs into the muscle once for 1 dose.    Dispense:  0.5 mL    Refill:  0    DX Z76.82, Z29.11, K70.31   pramipexole (MIRAPEX) 0.5 MG tablet    Sig: Start taking HALF tablet for dose 0.25mg  2 hour before bed. If not effective can take 1 whole tablet for dose 0.5mg  nightly if needed for RLS.    Dispense:  30 tablet    Refill:  2   Oxycodone HCl 10 MG TABS    Sig: Take 1 tablet (10 mg total) by mouth every 6 (six) hours as needed.    Dispense:  60 tablet     Refill:  0    First fill 04/03/23     Follow up plan: Return if symptoms  worsen or fail to improve.   Saralyn Pilar, DO [facility] 03/27/2023, 3:26 PM

## 2023-03-27 NOTE — Procedures (Signed)
PROCEDURE SUMMARY:  Successful image-guided paracentesis from the right abdomen.  Yielded 3.8 liters of clear yellow fluid.  No immediate complications.  EBL: zero Patient tolerated well.   Please see imaging section of Epic for full dictation.  Ardith Dark NP 03/27/2023 4:31 PM

## 2023-03-27 NOTE — Patient Instructions (Addendum)
Thank you for coming to the office today.  [facility], printed rx  Stop taking Ropinirole for restless leg syndrome  Start new med, use GoodRx coupon  pramipexole (MIRAPEX) 0.5 MG tablet [[id number]]   Order Details Dose, Route, Frequency: As Directed  Dispense Quantity: 30 tablet Refills: 2        Sig: Start taking HALF tablet for dose 0.25mg  2 hour before bed. If not effective can take 1 whole tablet for dose 0.5mg  nightly if needed for RLS.    Please schedule a Follow-up Appointment to: Return if symptoms worsen or fail to improve.  If you have any other questions or concerns, please feel free to call the office or send a message through MyChart. You may also schedule an earlier appointment if necessary.  Additionally, you may be receiving a survey about your experience at our office within a few days to 1 week by e-mail or mail. We value your feedback.  Saralyn Pilar, DO [facility], [place]

## 2023-03-28 ENCOUNTER — Emergency Department: Payer: Medicaid Other

## 2023-03-28 ENCOUNTER — Ambulatory Visit: Payer: Self-pay

## 2023-03-28 ENCOUNTER — Encounter: Payer: Self-pay | Admitting: Emergency Medicine

## 2023-03-28 ENCOUNTER — Other Ambulatory Visit: Payer: Self-pay | Admitting: Family Medicine

## 2023-03-28 ENCOUNTER — Emergency Department
Admission: EM | Admit: 2023-03-28 | Discharge: 2023-03-28 | Disposition: A | Discharge status: Home / Self Care | Payer: Medicaid Other | Attending: Emergency Medicine | Admitting: Emergency Medicine

## 2023-03-28 ENCOUNTER — Other Ambulatory Visit: Payer: Self-pay

## 2023-03-28 DIAGNOSIS — G8929 Other chronic pain: Secondary | ICD-10-CM | POA: Diagnosis not present

## 2023-03-28 DIAGNOSIS — M79671 Pain in right foot: Secondary | ICD-10-CM

## 2023-03-28 DIAGNOSIS — K7460 Unspecified cirrhosis of liver: Secondary | ICD-10-CM | POA: Diagnosis not present

## 2023-03-28 DIAGNOSIS — M25571 Pain in right ankle and joints of right foot: Secondary | ICD-10-CM | POA: Diagnosis not present

## 2023-03-28 DIAGNOSIS — M79604 Pain in right leg: Secondary | ICD-10-CM | POA: Diagnosis not present

## 2023-03-28 DIAGNOSIS — T8484XA Pain due to internal orthopedic prosthetic devices, implants and grafts, initial encounter: Secondary | ICD-10-CM

## 2023-03-28 DIAGNOSIS — R600 Localized edema: Secondary | ICD-10-CM | POA: Diagnosis not present

## 2023-03-28 DIAGNOSIS — Z981 Arthrodesis status: Secondary | ICD-10-CM | POA: Diagnosis not present

## 2023-03-28 DIAGNOSIS — M7989 Other specified soft tissue disorders: Secondary | ICD-10-CM | POA: Insufficient documentation

## 2023-03-28 DIAGNOSIS — Z515 Encounter for palliative care: Secondary | ICD-10-CM

## 2023-03-28 MED ORDER — OXYCODONE HCL 5 MG PO TABS
10.0000 mg | ORAL_TABLET | ORAL | Status: AC
  Administered 2023-03-28: 10 mg via ORAL
  Filled 2023-03-28: qty 2

## 2023-03-28 NOTE — Telephone Encounter (Signed)
I called patient today. I reviewed his timeline and his rx oxycodone 10 was last filled on 03/20/23. When I saw him in office visit yesterday 10/7, he was notified that his next fill date is in 1 more week, for 04/03/23. He has been getting med filled every 2 weeks.  He tells me now on phone that his pain is worsening and thinks it is orthopedic hardware screw issue, and pain is worse, he ended up taking more pain  medicine than expected and finished the 2 week supply in 1 week.  He is now currently answering the phone from the ED and will have his pain evaluated.  I will follow up on his chart to review, and determine his disposition and if we need to contact his pharmacy for earlier release of pain medicine if indicated with goal to manage pain to avoid recurrent ED trip if possible.  He should return to Orthopedics if the main problem is the hardware and the ankle.  Saralyn Pilar, DO [facility] 03/28/2023, 10:17 AM

## 2023-03-28 NOTE — Telephone Encounter (Signed)
Copied from CRM [id number]. Topic: General - Other >> Mar 28, 2023  8:30 AM Everette C wrote: Reason for CRM: Medication Refill - Medication: HYDROcodone-acetaminophen (NORCO/VICODIN) 5-325 MG tablet [[id number]]    Has the patient contacted their pharmacy? Yes.   (Agent: If no, request that the patient contact the pharmacy for the refill. If patient does not wish to contact the pharmacy document the reason why and proceed with request.) (Agent: If yes, when and what did the pharmacy advise?)  Preferred Pharmacy (with phone number or street name): [id number] [place] [place] - [address] [place] [address] Phone: [phone] Fax: [phone] Hours: Not open 24 hours   Has the patient been seen for an appointment in the last year OR does the patient have an upcoming appointment? Yes.    Agent: Please be advised that RX refills may take up to 3 business days. We ask that you follow-up with your pharmacy.

## 2023-03-28 NOTE — ED Triage Notes (Signed)
Pt here with right ankle pain. Pt states he had 2 fusions in July to that ankle. Pt states he is in a lot of pain but is out of pain medication d/t insurance issues. Pt states he had some rods removed and the pain feels the same. Pt had a parenthesis a few days ago. Pt has a hx of cirrhosis.

## 2023-03-28 NOTE — ED Provider Notes (Signed)
[facility] Provider Note    Event Date/Time   First MD Initiated Contact with Patient 03/28/23 1053     (approximate)   History   Ankle Pain   HPI  Tom [name] is a 57 y.o. male with a history of alcoholic cirrhosis pursuing liver transplant.  Reviewed primary care note from yesterday, noted believe the patient is following with orthopedics and various specialist regarding his   Patient reports that for several months she has had chronic pain in his right ankle.  He attributes this to problems after a right ankle fusion.  He has chronic severe pain in the right ankle.  He advises that he has run out of his oxycodone, and was given a new prescription but cannot get it filled because his insurance will not cover it yet as he is only allowed this prescription once every 30 days.  He ran out of oxycodone 2 days ago.  Comes seeking treatment for pain.  He also had a paracentesis done yesterday which she reports went well.  Swelling in both lower legs is common for him, including slightly greater in the right  No fevers or chills he has not had any breakdown of any skin drainage of pus, or screw protruding through the skin.    Physical Exam   Triage Vital Signs: ED Triage Vitals [03/28/23 0954]  Encounter Vitals Group     BP 118/69     Systolic BP Percentile      Diastolic BP Percentile      Pulse Rate 80     Resp 18     Temp 97.9 F (36.6 C)     Temp Source Oral     SpO2 99 %     Weight 220 lb 0.3 oz (99.8 kg)     Height 5\' 11"  (1.803 m)     Head Circumference      Peak Flow      Pain Score 10     Pain Loc      Pain Education      Exclude from Growth Chart     Most recent vital signs: Vitals:   03/28/23 0954 03/28/23 1223  BP: 118/69 115/65  Pulse: 80 75  Resp: 18 18  Temp: 97.9 F (36.6 C)   SpO2: 99% 98%     General: Awake, no distress.  Conversant, conversant with myself nurse and his wife CV:  Good peripheral  perfusion.  Strongly dopplerable dorsalis pedis pulse in the right foot.  Warm well-perfused toes of the right foot Resp:  Normal effort.  Abd:  No distention.  Other:  Right lower extremity no focal bony discomfort, except he does report tenderness along the plantar  surface of around the calcaneus though no lesion is apparent.  There is no erythema there is no mounding no obvious abscess.  His incisions appear chronic, clean no evidence of warmth erythema overlying cellulitis involving the right lower extremity.  He is not able to flex or dorsi plan at the right ankle chronic  ED Results / Procedures / Treatments   Labs (all labs ordered are listed, but only abnormal results are displayed) Labs Reviewed - No data to display    EKG     RADIOLOGY  X-ray right lower extremity interpreted by me as right ankle fusion    DG Ankle Complete Right  Result Date: 03/28/2023 CLINICAL DATA:  Right ankle pain. EXAM: RIGHT ANKLE - COMPLETE 3+ VIEW COMPARISON:  12/26/2022. FINDINGS:  Redemonstration of fusion of right hindfoot. The hardware appears in stable alignment. Periprosthetic lucency around the calcaneal screw is essentially unchanged. At least 5 abandoned threaded cortical screws in the distal tibia are also unchanged in position. No acute fracture or dislocation. No aggressive osseous lesion. Old healed fracture of distal right tibia noted. There is pseudo arthrodesis of tibiotalar joint. Calcaneal spur noted along the Plantar aponeurosis attachment site. There is diffuse soft tissue swelling of the distal leg. No radiopaque foreign bodies. IMPRESSION: 1. No acute osseous abnormality of the right ankle. 2. Stable postoperative changes of the right hindfoot. Periprosthetic lucency around the calcaneal screw is essentially unchanged. Electronically Signed   By: Jules Schick M.D.   On: 03/28/2023 11:20      PROCEDURES:  Critical Care performed: No  Procedures   MEDICATIONS ORDERED IN  ED: Medications  oxyCODONE (Oxy IR/ROXICODONE) immediate release tablet 10 mg (10 mg Oral Given 03/28/23 1117)     IMPRESSION / MDM / ASSESSMENT AND PLAN / ED COURSE  I reviewed the triage vital signs and the nursing notes.                              Differential diagnosis includes, but is not limited to, chronic right ankle pain, vascular insufficiency, edema, exclude DVT etc.  There is no evidence of infection by clinical exam.  He is afebrile he does not any infectious symptoms.  I spoke with Dr. Estanislado Spire, his primary care by phone, he saw him yesterday, and advises patient ran out about 1 week early on his oxycodone prescription that he is managing.  He did see him yesterday in the foot.  In its normal state, and has history of significant edema involving the right lower extremity and chronic pain.  Primary care advises patient needs follow-up with [facility] orthopedics, and I discussed this with the patient who advises he will be scheduling follow-up with Dr. Ginnie Smart. (His ortho MD)  At this juncture I do not see findings suggest an acute process, rather appears most likely consistent with chronic pain.  Patient is not driving.  Will give single dose of oxycodone here which patient is agreeable with, and therefore he will thereafter discussed with Dr. Kirtland Bouchard his primary care and set up follow-up with orthopedics.  Discussed careful return precautions including signs and symptoms of infection  Patient's presentation is most consistent with acute complicated illness / injury requiring diagnostic workup.   ----------------------------------------- 12:26 PM on 03/28/2023 ----------------------------------------- Patient's pain well-controlled.  He is fully alert and oriented.  Wife informed me that they were actually able to secure an appointment for him with his orthopedic doctor today at 2 PM in [place].  They are requesting to discharge.  I informed the patient I do not yet have the results of  his test for blood clot in the right leg, patient advises okay to call him if the test is positive.  Dvt study right lower extremity negative for DVT  At this juncture, I think it is quite reasonable for the patient to discharge so he may see his specialist at 2 PM.  He is going from here direct to his doctors appointment [facility] to see Dr. Deborah Chalk.  Wife driving him.  Return precautions and treatment recommendations and follow-up discussed with the patient who is agreeable with the plan.        FINAL CLINICAL IMPRESSION(S) / ED DIAGNOSES   Final diagnoses:  Chronic pain of right ankle     Rx / DC Orders   ED Discharge Orders     None        Note:  This document was prepared using Dragon voice recognition software and may include unintentional dictation errors.   Sharyn Creamer, MD 03/28/23 1623

## 2023-03-28 NOTE — Telephone Encounter (Signed)
Requested medication (s) are due for refill today:   See note from Dr. Althea Charon dated 10/8 at 10:17 AM regarding this refill request.  Requested medication (s) are on the active medication list:   Hydrocodone is not on the list but the Oxycodone is.  Future visit scheduled:      Last ordered   Requested Prescriptions  Pending Prescriptions Disp Refills   HYDROcodone-acetaminophen (NORCO/VICODIN) 5-325 MG tablet 9 tablet 0    Sig: Take 1 tablet by mouth every 6 (six) hours as needed for up to 3 days for severe pain.     Not Delegated - Analgesics:  Opioid Agonist Combinations Failed - 03/28/2023  9:30 AM      Failed - This refill cannot be delegated      Failed - Urine Drug Screen completed in last 360 days      Passed - Valid encounter within last 3 months    Recent Outpatient Visits           Yesterday Alcoholic cirrhosis of liver with ascites [facility])   [place] [place], [name], DO   1 month ago Alcoholic cirrhosis of liver with ascites [facility])   [place] [facility] Althea Charon, [name], DO   2 months ago Hospital discharge follow-up   [facility] Mecum, Oswaldo Conroy, PA-C   3 months ago No-show for appointment   [facility] [name], DO   4 months ago Chronic foot pain, right   [place] [facility] [place], [name], DO       Future Appointments             In 1 week Althea Charon, [name], DO [place] [facility], [facility]

## 2023-03-28 NOTE — Telephone Encounter (Signed)
Chief Complaint: Medication Question Symptoms: Chronic Right Foot Pain Frequency: Chronic  Disposition: [] ED /[] Urgent Care (no appt availability in office) / [] Appointment(In office/virtual)/ []  [place] [facility]/ [] Home Care/ [] Refused Recommended Disposition /[] [place] [facility]/ [x]  Follow-up with PCP Additional Notes: Patient states he was seen in office yesterday an thought provider was going to send in a prescription for his oxycodone 10 MG. Patient reports chronic foot pain. Advised patient that a prescription was sent but can not be picked up until Monday 04/03/23. Patient request PCP approve an early pick up for today because he is out of medication and stated he will not take anything else for pain. Patient states if it is not approved for pick up today he is going to the ED. Care advice was given and advised I would forward request to PCP.   Reason for Disposition  [1] Caller has NON-URGENT medicine question about med that PCP prescribed AND [2] triager unable to answer question  Answer Assessment - Initial Assessment Questions 1. NAME of MEDICINE: "What medicine(s) are you calling about?"     Oxycodone 10 MG every 6 hours  2. QUESTION: "What is your question?" (e.g., double dose of medicine, side effect)     Can I have my medication approved earlier, I can not wait until Monday  3. PRESCRIBER: "Who prescribed the medicine?" Reason: if prescribed by specialist, call should be referred to that group.     Dr. Althea Charon  4. SYMPTOMS: "Do you have any symptoms?" If Yes, ask: "What symptoms are you having?"  "How bad are the symptoms (e.g., mild, moderate, severe)     Chronic Right foot pain 10/10  Protocols used: Medication Question Call-A-AH

## 2023-03-28 NOTE — ED Notes (Signed)
Pt verbalizes understanding of discharge instructions. Opportunity for questioning and answers were provided. Pt discharged from ED to home with wife. Patient is going to orthopedic appointment in [place].

## 2023-03-28 NOTE — ED Notes (Signed)
See triage note  Presents with pain to right ankle  States he has had 2 fusions to same ankle  Last time was in July  States having increased pain

## 2023-03-28 NOTE — Discharge Instructions (Addendum)
I am glad you are able to make an appointment with your orthopedist today at 2 PM.  Please attend this appointment is very important  Call 911 and go to the emergency department if you develop a cold or blue foot, cannot feel your leg, chest pain, difficulty breathing or other concerns arise

## 2023-03-29 MED ORDER — OXYCODONE HCL 10 MG PO TABS: 10.0000 mg | ORAL_TABLET | ORAL | 0 refills | Status: DC | PRN

## 2023-03-29 NOTE — Telephone Encounter (Signed)
Called patient today.  He saw Dr Ruben [facility]. At [facility] yesterday 10/8 and confirmed the screws were moving and lose in the hardware but he is unable to remove or replace them at this time as it would complicate the integrity of the hardware and joint.  He requested that we continue pain management here through PCP  [facility] contacted [facility], they can fill a short term additional pill count oxycodone 10mg  with new instructions to reflect inc dosing for acute pain short term until he can pick up existing Future order on 10/14  Rx sent. Patient notified.  Saralyn Pilar, DO [facility] 03/29/2023, 6:26 PM

## 2023-03-29 NOTE — Addendum Note (Signed)
Addended by: [name] on: 03/29/2023 06:26 PM   Modules accepted: Orders

## 2023-03-31 ENCOUNTER — Other Ambulatory Visit: Payer: Medicaid Other | Admitting: *Deleted

## 2023-03-31 NOTE — Patient Outreach (Signed)
Medicaid Managed Care   Nurse Care Manager Note  03/31/2023 Name:  Tom [name] MRN:  [id number] DOB:  12-20-1965  Tom [name] is an 57 y.o. year old male who is a primary patient of [name], DO.  The [facility] Managed Care Coordination team was consulted for assistance with:    Cirrhosis  [name] was given information about Medicaid Managed Care Coordination team services today. Tom [name] Patient agreed to services and verbal consent obtained.  Engaged with patient by telephone for follow up visit in response to provider referral for case management and/or care coordination services.   Assessments/Interventions:  Review of past medical history, allergies, medications, health status, including review of consultants reports, laboratory and other test data, was performed as part of comprehensive evaluation and provision of chronic care management services.  SDOH (Social Determinants of Health) assessments and interventions performed: SDOH Interventions    Flowsheet Row Telephone from 01/11/2023 in [place] POPULATION HEALTH DEPARTMENT ED to Hosp-Admission (Discharged) from 12/26/2022 in [facility] ED to Hosp-Admission (Discharged) from 12/15/2022 in [facility] Patient Outreach Telephone from 10/19/2022 in [place] POPULATION HEALTH DEPARTMENT Telephone from 09/28/2022 in [place] POPULATION HEALTH DEPARTMENT  SDOH Interventions       Transportation Interventions Intervention Not Indicated Inpatient TOC, Other (Comment) Inpatient TOC -- Intervention Not Indicated  Utilities Interventions -- -- -- Intervention Not Indicated --       Care Plan  Allergies  Allergen Reactions   Alfuzosin Other (See Comments)    Dizziness    Amlodipine Nausea And Vomiting   Bee Venom Swelling   Cymbalta [Duloxetine Hcl] Other (See Comments)    Emesis    Duloxetine  Other (See Comments)    Emesis   Lisinopril    Flomax [Tamsulosin Hcl] Itching    Medications Reviewed Today     Reviewed by Tom Dach, RN (Registered Nurse) on 03/31/23 at 1333  Med List Status: <None>   Medication Order Taking? Sig Documenting Provider Last Dose Status Informant  albuterol (PROVENTIL) (2.5 MG/3ML) 0.083% nebulizer solution [id number] Yes Take 3 mLs (2.5 mg total) by nebulization every 4 (four) hours as needed for wheezing or shortness of breath. [name], MD Taking Active Spouse/Significant Other  albuterol (VENTOLIN HFA) 108 (90 Base) MCG/ACT inhaler [id number] Yes Inhale 2 puffs into the lungs every 4 (four) hours as needed. [name], DO Taking Active   budesonide-formoterol [facility]) 80-4.5 MCG/ACT inhaler [id number] Yes Take 2 puffs first thing in am and then another 2 puffs about 12 hours later. [name], DO Taking Active Spouse/Significant Other  EPINEPHrine (EPIPEN 2-PAK) 0.3 mg/0.3 mL IJ SOAJ injection [id number]  Inject 0.3 mg into the muscle as needed for anaphylaxis. [name], DO  Active Spouse/Significant Other  famotidine (PEPCID) 20 MG tablet [id number]  Take 1 tablet (20 mg total) by mouth daily after supper. One after supper [name], DO  Active Spouse/Significant Other  Homeopathic Products (LIVER SUPPORT SL) [id number] No Place under the tongue.  Patient not taking: Reported on 02/01/2023   [provider] Not Taking Active Spouse/Significant Other  hydrOXYzine (VISTARIL) 25 MG capsule [id number] Yes Take 1 capsule (25 mg total) by mouth every 8 (eight) hours as needed. [name], DO Taking Active   lactulose [facility]) 10 GM/15ML solution [id number] Yes SMARTSIG:15 Milliliter(s) By Mouth Every Other Day [name], DO Taking Active Spouse/Significant Other  Med Note Tom [name]   Tue Dec 27, 2022  2:03 AM) Patient takes every day.   lactulose (CHRONULAC) 10 GM/15ML solution [id number] Yes Take 15 mLs (10 g total) by mouth daily. Tom Antis, MD Taking Active   magnesium oxide (MAG-OX) 400 (240 Mg) MG tablet [id number] Yes Take 2 tablets by mouth 2 (two) times daily. [provider] Taking Active Spouse/Significant Other  Multiple Vitamin (MULTIVITAMIN) capsule [id number] Yes Take 1 capsule by mouth daily. [provider] Taking Active Spouse/Significant Other  ondansetron (ZOFRAN-ODT) 4 MG disintegrating tablet [id number] Yes Take 1 tablet (4 mg total) by mouth every 8 (eight) hours as needed for nausea or vomiting. [name], DO Taking Active   Oxycodone HCl 10 MG TABS [id number] Yes Take 1 tablet (10 mg total) by mouth every 6 (six) hours as needed. [name], DO Taking Active   Oxycodone HCl 10 MG TABS [id number] Yes Take 1 tablet (10 mg total) by mouth every 4 (four) hours as needed (acute pain Right foot). [name], DO Taking Active   pantoprazole (PROTONIX) 40 MG tablet [id number] Yes Take 1 tablet (40 mg total) by mouth daily before breakfast. [name], DO Taking Active Spouse/Significant Other  pramipexole (MIRAPEX) 0.5 MG tablet [id number] Yes Start taking HALF tablet for dose 0.25mg  2 hour before bed. If not effective can take 1 whole tablet for dose 0.5mg  nightly if needed for RLS. [name], DO Taking Active   spironolactone (ALDACTONE) 25 MG tablet [id number] Yes Take 2 tablets (50 mg total) by mouth daily. [name], DO Taking Active   thiamine (VITAMIN B1) 100 MG tablet [id number] Yes Take 1 tablet (100 mg total) by mouth daily. [name], DO Taking Active             Patient Active Problem List   Diagnosis Date Noted   Abdominal pain 01/16/2023   Anxiety 01/16/2023   Chronic kidney disease, stage 3a (HCC) 01/16/2023   Cellulitis of right lower extremity 12/31/2022   Obesity (BMI  30-39.9) 12/27/2022   Cellulitis of right foot 12/26/2022   Hyponatremia 12/17/2022   End stage liver disease (HCC) 12/15/2022   Dyslipidemia 12/04/2022   COPD with asthma (HCC) 12/04/2022   GERD without esophagitis 12/04/2022   Abdominal distension 12/04/2022   Shortness of breath 12/04/2022   Ascites 12/03/2022   Right foot pain 11/15/2022   AKI (acute kidney injury) (HCC) 11/15/2022   Elevated lactic acid level 11/15/2022   Complication associated with orthopedic device (HCC) 10/21/2022   Alcoholic cirrhosis of liver with ascites (HCC) 07/04/2022   Former smoker 09/16/2021   Asthmatic bronchitis , chronic (HCC) 09/16/2021   Pre-diabetes 08/13/2021   Morbid obesity due to excess calories (HCC) 08/13/2021   DOE (dyspnea on exertion) 04/13/2021   Acute pain of left wrist 03/25/2021   DDD (degenerative disc disease), lumbar 12/16/2020   Chronic back pain 12/16/2020   Arthritis of right ankle 05/08/2017   Atherosclerosis of native coronary artery of native heart without angina pectoris 01/06/2015   Essential hypertension 10/03/2012   Osteoarthritis 12/06/2011    Conditions to be addressed/monitored per PCP order:   Cirrhosis  Care Plan : RN Care Manager Plan of Care  Updates made by Tom Dach, RN since 03/31/2023 12:00 AM     Problem: Health Managment needs related to Liver Cirrhosis      Long-Range Goal: Development of Plan of Care to address Health Managment needs related to Liver  Cirrhosis   Start Date: 02/01/2023  Expected End Date: 05/02/2023  Note:   Current Barriers:  Knowledge Deficits related to plan of care for management of Liver Cirrohsis Mr. Attwood is hoping for a liver transplant. Follow up with [facility] 04/24/23. Continues to have weekly paracentesis.  RNCM Clinical Goal(s):  Patient will verbalize understanding of plan for management of Liver Cirrhosis as evidenced by patient reports attend all scheduled medical appointments: 04/03/23 for  Paracentesis, 04/06/23 with PCP and 04/24/23 with [facility] as evidenced by provider documentation in EMR        continue to work with RN Care Manager and/or Social Worker to address care management and care coordination needs related to Liver Cirrhosis as evidenced by adherence to CM Team Scheduled appointments     through collaboration with Medical illustrator, provider, and care team.   Interventions: Evaluation of current treatment plan related to  self management and patient's adherence to plan as established by provider   Liver Cirrhosis  (Status: Goal on Track (progressing): YES.) Long Term Goal  Evaluation of current treatment plan related to  Liver Cirrhosis ,  self-management and patient's adherence to plan as established by provider. Discussed plans with patient for ongoing care management follow up and provided patient with direct contact information for care management team Advised patient to keep a calendar of visits, providers and contact information; Reviewed medications with patient and discussed pain management regime; Assessed social determinant of health barriers;  Advised patient to discuss any concerns or questions regarding liver transplant with Hepatology-discussed sending MyChart message is the best way to communicate  Reviewed provider note   Patient Goals/Self-Care Activities: Take medications as prescribed   Attend all scheduled provider appointments Call provider office for new concerns or questions        Follow Up:  Patient agrees to Care Plan and Follow-up.  Plan: The Managed Medicaid care management team will reach out to the patient again over the next 30 days.  Date/time of next scheduled RN care management/care coordination outreach:  05/02/23 at 1:15pm  [name] RN, BSN [place]  [facility] RN Care Coordinator [phone]

## 2023-03-31 NOTE — Patient Instructions (Signed)
Visit Information  [name] was given information about Medicaid Managed Care team care coordination services as a part of their Healthy [employer] Medicaid benefit. Tom [name] verbally consented to engagement with the [facility] Managed Care team.   If you are experiencing a medical emergency, please call 911 or report to your local emergency department or urgent care.   If you have a non-emergency medical problem during routine business hours, please contact your provider's office and ask to speak with a nurse.   For questions related to your Healthy [facility] health plan, please call: [phone] or visit the homepage here: [email or web address]  If you would like to schedule transportation through your Healthy [facility] plan, please call the following number at least 2 days in advance of your appointment: [phone]  For information about your ride after you set it up, call Ride Assist at [phone]. Use this number to activate a Will Call pickup, or if your transportation is late for a scheduled pickup. Use this number, too, if you need to make a change or cancel a previously scheduled reservation.  If you need transportation services right away, call [phone]. The after-hours call center is staffed 24 hours to handle ride assistance and urgent reservation requests (including discharges) 365 days a year. Urgent trips include sick visits, hospital discharge requests and life-sustaining treatment.  Call the [facility] Line at [phone], at any time, 24 hours a day, 7 days a week. If you are in danger or need immediate medical attention call 911.  If you would like help to quit smoking, call 1-800-QUIT-NOW (484-461-2055) OR Espaol: 1-855-Djelo-Ya (4-742-595-6387) o para ms informacin haga clic aqu or Text READY to 564-332 to register via text  Tom [name],   Please see education materials related to  cirrhosis provided by MyChart link.  Patient verbalizes understanding of instructions and care plan provided today and agrees to view in MyChart. Active MyChart status and patient understanding of how to access instructions and care plan via MyChart confirmed with patient.     Telephone follow up appointment with Managed Medicaid care management team member scheduled for:05/02/23 at 1:15pm  Tom Emms RN, BSN Tom [name]  Value-Based Care [facility] RN Care Coordinator [phone]   Following is a copy of your plan of care:  Care Plan : RN Care Manager Plan of Care  Updates made by Tom Dach, RN since 03/31/2023 12:00 AM     Problem: Health Managment needs related to Liver Cirrhosis      Long-Range Goal: Development of Plan of Care to address Health Managment needs related to Liver Cirrhosis   Start Date: 02/01/2023  Expected End Date: 05/02/2023  Note:   Current Barriers:  Knowledge Deficits related to plan of care for management of Liver Cirrohsis Tom [name] is hoping for a liver transplant. Follow up with [facility] 04/24/23. Continues to have weekly paracentesis.  RNCM Clinical Goal(s):  Patient will verbalize understanding of plan for management of Liver Cirrhosis as evidenced by patient reports attend all scheduled medical appointments: 04/03/23 for Paracentesis, 04/06/23 with PCP and 04/24/23 with [facility] as evidenced by provider documentation in EMR        continue to work with RN Care Manager and/or Social Worker to address care management and care coordination needs related to Liver Cirrhosis as evidenced by adherence to CM Team Scheduled appointments     through collaboration with RN Care manager, provider, and care team.   Interventions: Evaluation of current treatment plan related  to  self management and patient's adherence to plan as established by provider   Liver Cirrhosis  (Status: Goal on Track (progressing): YES.) Long Term Goal   Evaluation of current treatment plan related to  Liver Cirrhosis ,  self-management and patient's adherence to plan as established by provider. Discussed plans with patient for ongoing care management follow up and provided patient with direct contact information for care management team Advised patient to keep a calendar of visits, providers and contact information; Reviewed medications with patient and discussed pain management regime; Assessed social determinant of health barriers;  Advised patient to discuss any concerns or questions regarding liver transplant with Hepatology-discussed sending MyChart message is the best way to communicate  Reviewed provider note   Patient Goals/Self-Care Activities: Take medications as prescribed   Attend all scheduled provider appointments Call provider office for new concerns or questions

## 2023-04-03 ENCOUNTER — Ambulatory Visit
Admission: RE | Admit: 2023-04-03 | Discharge: 2023-04-03 | Disposition: A | Discharge status: Home / Self Care | Payer: Medicaid Other | Source: Ambulatory Visit | Attending: Transplant Hepatology | Admitting: Transplant Hepatology

## 2023-04-03 ENCOUNTER — Other Ambulatory Visit: Payer: Self-pay | Admitting: Family Medicine

## 2023-04-03 DIAGNOSIS — K7460 Unspecified cirrhosis of liver: Secondary | ICD-10-CM | POA: Diagnosis not present

## 2023-04-03 DIAGNOSIS — J432 Centrilobular emphysema: Secondary | ICD-10-CM

## 2023-04-03 MED ORDER — ALBUMIN HUMAN 25 % IV SOLN: 25.0000 g | Freq: Once | INTRAVENOUS | Status: AC

## 2023-04-03 MED ORDER — LIDOCAINE HCL (PF) 1 % IJ SOLN
10.0000 mL | Freq: Once | INTRAMUSCULAR | Status: AC
  Administered 2023-04-03: 10 mL via INTRADERMAL
  Filled 2023-04-03: qty 10

## 2023-04-03 MED ORDER — ALBUMIN HUMAN 25 % IV SOLN
INTRAVENOUS | Status: AC
  Administered 2023-04-03: 25 g via INTRAVENOUS
  Filled 2023-04-03: qty 100

## 2023-04-03 NOTE — Telephone Encounter (Signed)
Requested Prescriptions  Pending Prescriptions Disp Refills   budesonide-formoterol (SYMBICORT) 80-4.5 MCG/ACT inhaler [Pharmacy Med Name: SYMBICORT 80-4.5 MCG INHALER] 10.2 each 11    Sig: TAKE 2 PUFFS FIRST THING IN AM AND THEN ANOTHER 2 PUFFS ABOUT 12 HOURS LATER.     Pulmonology:  Combination Products Passed - 04/03/2023  1:36 AM      Passed - Valid encounter within last 12 months    Recent Outpatient Visits           1 week ago Alcoholic cirrhosis of liver with ascites [facility])   [place] [facility], [name], DO   1 month ago Alcoholic cirrhosis of liver with ascites [facility])   [place] [facility] [name], DO   2 months ago Hospital discharge follow-up   [place] [facility] Mecum, Oswaldo Conroy, PA-C   3 months ago No-show for appointment   [facility] [name], DO   4 months ago Chronic foot pain, right   [place] [facility] Althea Charon, [name], DO       Future Appointments             In 3 days Althea Charon, [name], DO  [facility] [place]

## 2023-04-03 NOTE — Procedures (Signed)
PROCEDURE SUMMARY:  Successful image-guided paracentesis from the right lower abdomen.  Yielded 5.6 liters of clear yellow fluid.  No immediate complications.  EBL = trace. Patient tolerated well.   Specimen was not sent for labs.  Please see imaging section of Epic for full dictation.   Kennieth Francois PA-C 04/03/2023 12:27 PM

## 2023-04-04 ENCOUNTER — Other Ambulatory Visit: Payer: Medicaid Other

## 2023-04-04 NOTE — Patient Outreach (Signed)
Medicaid Managed Care   Unsuccessful Outreach Note  04/04/2023 Name: Tom [name] MRN: [id number] DOB: 1965/09/01  Referred by: [name], DO Reason for referral : High Risk Managed Medicaid (MM social work unsuccessful telephone outreach )   An unsuccessful telephone outreach was attempted today. The patient was referred to the case management team for assistance with care management and care coordination.   Follow Up Plan: The patient has been provided with contact information for the care management team and has been advised to call with any health related questions or concerns.   Abelino Derrick, MHA [facility]  Managed [facility] Social Worker [phone]

## 2023-04-04 NOTE — Patient Instructions (Signed)
Medicaid Managed Care   Unsuccessful Outreach Note  04/04/2023 Name: Tom [name] MRN: [id number] DOB: 1965/09/01  Referred by: [name], DO Reason for referral : High Risk Managed Medicaid (MM social work unsuccessful telephone outreach )   An unsuccessful telephone outreach was attempted today. The patient was referred to the case management team for assistance with care management and care coordination.   Follow Up Plan: The patient has been provided with contact information for the care management team and has been advised to call with any health related questions or concerns.   Abelino Derrick, MHA [facility]  Managed [facility] Social Worker [phone]

## 2023-04-05 ENCOUNTER — Other Ambulatory Visit: Payer: Self-pay | Admitting: Transplant Hepatology

## 2023-04-05 ENCOUNTER — Other Ambulatory Visit: Payer: Self-pay | Admitting: Family Medicine

## 2023-04-05 DIAGNOSIS — K7460 Unspecified cirrhosis of liver: Secondary | ICD-10-CM

## 2023-04-05 DIAGNOSIS — L299 Pruritus, unspecified: Secondary | ICD-10-CM

## 2023-04-05 MED ORDER — THIAMINE HCL 100 MG PO TABS: 100.0000 mg | ORAL_TABLET | Freq: Every day | ORAL | 3 refills | Status: AC

## 2023-04-05 MED ORDER — HYDROXYZINE PAMOATE 25 MG PO CAPS: 25.0000 mg | ORAL_CAPSULE | Freq: Three times a day (TID) | ORAL | 2 refills | Status: DC | PRN

## 2023-04-05 NOTE — Telephone Encounter (Signed)
Requested Prescriptions  Pending Prescriptions Disp Refills   thiamine (VITAMIN B1) 100 MG tablet 90 tablet 0    Sig: Take 1 tablet (100 mg total) by mouth daily.     Off-Protocol Failed - 04/05/2023 10:40 AM      Failed - Medication not assigned to a protocol, review manually.      Passed - Valid encounter within last 12 months    Recent Outpatient Visits           1 week ago Alcoholic cirrhosis of liver with ascites [facility])   [place] [facility] [place], [name], DO   1 month ago Alcoholic cirrhosis of liver with ascites [facility])   [place] [facility] [name], DO   2 months ago Hospital discharge follow-up   [place] [facility] Mecum, Oswaldo Conroy, PA-C   3 months ago No-show for appointment   [facility] [name], DO   4 months ago Chronic foot pain, right   [place] [facility] [place], [name], DO       Future Appointments             Tomorrow Althea Charon [name], DO [place] [facility]             hydrOXYzine (VISTARIL) 25 MG capsule 30 capsule 2    Sig: Take 1 capsule (25 mg total) by mouth every 8 (eight) hours as needed.     Ear, Nose, and Throat:  Antihistamines 2 Passed - 04/05/2023 10:40 AM      Passed - Cr in normal range and within 360 days    Creat  Date Value Ref Range Status  09/21/2022 0.81 0.70 - 1.30 mg/dL Final   Creatinine, Ser  Date Value Ref Range Status  03/15/2023 1.12 0.61 - 1.24 mg/dL Final         Passed - Valid encounter within last 12 months    Recent Outpatient Visits           1 week ago Alcoholic cirrhosis of liver with ascites [facility])   [place] [facility] [place], [name], DO   1 month ago Alcoholic cirrhosis of liver with ascites [facility]   [place] Althea Charon, [name], DO   2 months ago  Hospital discharge follow-up   [place] [facility] Mecum, Oswaldo Conroy, PA-C   3 months ago No-show for appointment   [facility] [name], DO   4 months ago Chronic foot pain, right   [place] [facility] [place], [name], DO       Future Appointments             Tomorrow Althea Charon, [name], DO  [facility]

## 2023-04-05 NOTE — Telephone Encounter (Signed)
Requested medication (s) are due for refill today: No  Requested medication (s) are on the active medication list: Yes  Last refill:  03/17/23  Future visit scheduled:   Notes to clinic:  Manual review.    Requested Prescriptions  Pending Prescriptions Disp Refills   thiamine (VITAMIN B1) 100 MG tablet 90 tablet 0    Sig: Take 1 tablet (100 mg total) by mouth daily.     Off-Protocol Failed - 04/05/2023 10:40 AM      Failed - Medication not assigned to a protocol, review manually.      Passed - Valid encounter within last 12 months    Recent Outpatient Visits           1 week ago Alcoholic cirrhosis of liver with ascites [facility])   [place] [facility] [place], [name], DO   1 month ago Alcoholic cirrhosis of liver with ascites [facility])   [place] [facility] [name], DO   2 months ago Hospital discharge follow-up   [place] [facility] Mecum, Oswaldo Conroy, PA-C   3 months ago No-show for appointment   [facility] [name], DO   4 months ago Chronic foot pain, right   [place] [facility] [place], [name], DO       Future Appointments             Tomorrow [name], DO [place] [facility], [facility]            Signed Prescriptions Disp Refills   hydrOXYzine (VISTARIL) 25 MG capsule 30 capsule 2    Sig: Take 1 capsule (25 mg total) by mouth every 8 (eight) hours as needed.     Ear, Nose, and Throat:  Antihistamines 2 Passed - 04/05/2023 10:40 AM      Passed - Cr in normal range and within 360 days    Creat  Date Value Ref Range Status  09/21/2022 0.81 0.70 - 1.30 mg/dL Final   Creatinine, Ser  Date Value Ref Range Status  03/15/2023 1.12 0.61 - 1.24 mg/dL Final         Passed - Valid encounter within last 12 months    Recent Outpatient Visits           1 week ago Alcoholic cirrhosis of  liver with ascites [facility])   [place] [facility], [name], DO   1 month ago Alcoholic cirrhosis of liver with ascites [facility])   [place] [facility] Althea Charon, [name], DO   2 months ago Hospital discharge follow-up   [place] [facility] Mecum, Oswaldo Conroy, PA-C   3 months ago No-show for appointment   [facility] [name], DO   4 months ago Chronic foot pain, right   [place] [facility] [place], [name], DO       Future Appointments             Tomorrow Althea Charon, [name], DO [place] [facility], [facility]

## 2023-04-05 NOTE — Telephone Encounter (Signed)
Medication Refill - Medication:  thiamine (VITAMIN B1) 100 MG tablet  hydrOXYzine (VISTARIL) 25 MG capsule  *completely out   Has the patient contacted their pharmacy? Yes, advised to contact PCP.  Preferred Pharmacy (with phone number or street name):  [id number] [place] [facility] [address]  Phone: [phone] Fax: [phone]   Has the patient been seen for an appointment in the last year OR does the patient have an upcoming appointment? Yes. F/U on 10.17.2024

## 2023-04-06 ENCOUNTER — Ambulatory Visit: Payer: Medicaid Other | Admitting: Family Medicine

## 2023-04-06 ENCOUNTER — Other Ambulatory Visit: Payer: Self-pay | Admitting: Family Medicine

## 2023-04-06 VITALS — BP 130/68 | HR 98 | Ht 71.0 in | Wt 231.0 lb

## 2023-04-06 DIAGNOSIS — K7031 Alcoholic cirrhosis of liver with ascites: Secondary | ICD-10-CM

## 2023-04-06 DIAGNOSIS — Z515 Encounter for palliative care: Secondary | ICD-10-CM

## 2023-04-06 DIAGNOSIS — T8484XA Pain due to internal orthopedic prosthetic devices, implants and grafts, initial encounter: Secondary | ICD-10-CM

## 2023-04-06 MED ORDER — OXYCODONE HCL 15 MG PO TABS: 15.0000 mg | ORAL_TABLET | ORAL | 0 refills | Status: DC | PRN

## 2023-04-06 NOTE — Progress Notes (Signed)
Subjective:    Patient ID: Tom [name], male    DOB: 1965-09-30, 57 y.o.   MRN: [id number]  Tom [name] is a 57 y.o. male presenting on 04/06/2023 for Cirrhosis and Pain Management   HPI  Discussed the use of AI scribe software for clinical note transcription with the patient, who gave verbal consent to proceed.     Alcoholic Cirrhosis Need Liver Transplant Chronic R Foot Pain - Painful Orthopedic Hardware Complication   The patient, with a history of liver disease and chronic foot pain, presented for a follow-up visit. The patient reported an increase in pain severity, necessitating a higher dosage of his current pain medication, oxycodone. The patient stated that he was taking two 10mg  tablets every six hours, which provided enough relief to allow for minimal activity. However, the patient expressed a desire to increase the dosage to 15mg  to better manage the pain.  The patient also reported a recent visit to the emergency room due to severe pain on 03/28/23. An X-ray was performed, which revealed a periprosthetic lucency around the calcaneal screw in the patient's foot. At that time, I discussed his case with the ED provider, and worked with them to renew his pain medicine on time. He was sent back to his [facility] Dr Deborah Chalk and they advised that no further surgical intervention could be done due to his liver / cirrhosis. He asked patient to continue managing his pain with his primary care until he can proceed w/ Liver transplant first. The patient also discussed his upcoming liver transplant and expressed concerns about the healing process post-transplant.        02/22/2023    2:40 PM 03/21/2022    2:56 PM 12/16/2020    3:16 PM  Depression screen PHQ 2/9  Decreased Interest 0 0 0  Down, Depressed, Hopeless 0 0 0  PHQ - 2 Score 0 0 0  Altered sleeping  0 0  Tired, decreased energy  0 0  Change in appetite  0 0  Feeling bad or failure about yourself   0 0   Trouble concentrating  0 0  Moving slowly or fidgety/restless  0 0  Suicidal thoughts  0 0  PHQ-9 Score  0 0  Difficult doing work/chores  Not difficult at all Not difficult at all    Social History   Tobacco Use   Smoking status: Former    Current packs/day: 0.00    Average packs/day: 1 pack/day for 39.0 years (39.0 ttl pk-yrs)    Types: Cigarettes    Start date: 12/1976    Quit date: 12/2015    Years since quitting: 7.3   Smokeless tobacco: Never  Vaping Use   Vaping status: Never Used  Substance Use Topics   Alcohol use: No   Drug use: Yes    Types: Marijuana    Review of Systems Per HPI unless specifically indicated above     Objective:    BP 130/68   Pulse 98   Ht 5\' 11"  (1.803 m)   Wt 231 lb (104.8 kg)   SpO2 95%   BMI 32.22 kg/m   Wt Readings from Last 3 Encounters:  04/06/23 231 lb (104.8 kg)  03/28/23 220 lb 0.3 oz (99.8 kg)  03/15/23 220 lb (99.8 kg)    Physical Exam Vitals and nursing note reviewed.  Constitutional:      General: He is not in acute distress.    Appearance: Normal appearance. He is well-developed. He  is not diaphoretic.     Comments: Chronically ill-appearing, uncomfortable due to foot pain, cooperative  HENT:     Head: Normocephalic and atraumatic.  Eyes:     General:        Right eye: No discharge.        Left eye: No discharge.     Conjunctiva/sclera: Conjunctivae normal.  Cardiovascular:     Rate and Rhythm: Normal rate.  Pulmonary:     Effort: Pulmonary effort is normal.  Musculoskeletal:     Right lower leg: Edema present.     Left lower leg: Edema present.     Comments: RLE with walking boot on  Skin:    General: Skin is warm and dry.     Findings: No erythema or rash.  Neurological:     Mental Status: He is alert and oriented to person, place, and time.  Psychiatric:        Mood and Affect: Mood normal.        Behavior: Behavior normal.        Thought Content: Thought content normal.     Comments: Well  groomed, good eye contact, normal speech and thoughts      Results for orders placed or performed in visit on 03/27/23  HM COLONOSCOPY  Result Value Ref Range   HM Colonoscopy See Report (in chart) See Report (in chart), Patient Reported      Assessment & Plan:   Problem List Items Addressed This Visit     Alcoholic cirrhosis of liver with ascites (HCC)   Other Visit Diagnoses     Painful orthopaedic hardware (HCC)    -  Primary   Relevant Medications   oxyCODONE (ROXICODONE) 15 MG immediate release tablet (Start on 04/17/2023)   Palliative care patient       Relevant Medications   oxyCODONE (ROXICODONE) 15 MG immediate release tablet (Start on 04/17/2023)      Assessment and Plan    Chronic Pain, Right Foot Painful Orthopedic Hardware Complication Chronic fluid around screws hardware  Severe pain requiring Oxycodone 10mg , currently taking 2 tablets every 6 hours. Patient requests increase in dosage due to inadequate pain control. Discussed limitations due to liver disease and safety concerns. Will agree to pursue dose adjustment at this time. -Increase Oxycodone to 15mg ,#60 pills, 1 dose 4 times per day, 15 day supply, to be picked up on 04/17/2023. -Follow-up visit in 2 weeks after medication adjustment to assess pain control.  Cirrhosis, Alcohol Ongoing management with liver specialist. Patient due for paracentesis and has appointment with liver specialist on 04/24/2023. -Continue current management plan with liver specialist. -Advised that he can keep weekly paracentesis at this time, if Hepatology decides to adjust his timing to longer interval that will be up to them, I am not comfortable making that decision today  He has to wait on future Liver Transplant prior to surgical intervention on his R Foot He is asking about complicated surgical questions and scenarios regarding R Foot surgery vs amputation, I advised him again that I am uncertain on how to answer these  questions given his current circumstance with pending Liver transplant and that he needs to obtain the Orthopedic surgical advice from Ortho again in future when ready.      Meds ordered this encounter  Medications   oxyCODONE (ROXICODONE) 15 MG immediate release tablet    Sig: Take 1 tablet (15 mg total) by mouth every 4 (four) hours as needed for pain.  Dispense:  60 tablet    Refill:  0    Dose increase, First fill 04/17/23     Follow up plan: Return if symptoms worsen or fail to improve.   Saralyn Pilar, DO [facility] 04/06/2023, 11:13 AM

## 2023-04-06 NOTE — Patient Instructions (Addendum)
Thank you for coming to the office today.  Next dose Oxycodone 15mg  dose increase on 04/17/23 Monday  Please schedule a Follow-up Appointment to: Return if symptoms worsen or fail to improve.  If you have any other questions or concerns, please feel free to call the office or send a message through MyChart. You may also schedule an earlier appointment if necessary.  Additionally, you may be receiving a survey about your experience at our office within a few days to 1 week by e-mail or mail. We value your feedback.  Saralyn Pilar, DO [facility], [place]

## 2023-04-06 NOTE — Telephone Encounter (Signed)
Requested Prescriptions  Pending Prescriptions Disp Refills   albuterol (VENTOLIN HFA) 108 (90 Base) MCG/ACT inhaler [Pharmacy Med Name: VENTOLIN HFA 90 MCG INHALER] 18 each 0    Sig: INHALE 2 PUFFS BY MOUTH EVERY 4 HOURS AS NEEDED     Pulmonology:  Beta Agonists 2 Passed - 04/06/2023 11:51 AM      Passed - Last BP in normal range    BP Readings from Last 1 Encounters:  04/06/23 130/68         Passed - Last Heart Rate in normal range    Pulse Readings from Last 1 Encounters:  04/06/23 98         Passed - Valid encounter within last 12 months    Recent Outpatient Visits           Today Painful orthopaedic hardware [facility])   [place] [facility] [place], [name], DO   1 week ago Alcoholic cirrhosis of liver with ascites [facility])   [place] [facility] [place], [name], DO   1 month ago Alcoholic cirrhosis of liver with ascites [facility])   [place] [facility] [place], [name], DO   2 months ago Hospital discharge follow-up   [place] [facility] Mecum, Oswaldo Conroy, PA-C   3 months ago No-show for appointment   [facility] [name], DO       Future Appointments             In 3 weeks Althea Charon, [name], DO [place] [facility], [facility]

## 2023-04-10 ENCOUNTER — Ambulatory Visit: Payer: Self-pay

## 2023-04-10 ENCOUNTER — Ambulatory Visit
Admission: RE | Admit: 2023-04-10 | Discharge: 2023-04-10 | Disposition: A | Discharge status: Home / Self Care | Payer: Medicaid Other | Source: Ambulatory Visit | Attending: Transplant Hepatology | Admitting: Transplant Hepatology

## 2023-04-10 DIAGNOSIS — K7460 Unspecified cirrhosis of liver: Secondary | ICD-10-CM | POA: Insufficient documentation

## 2023-04-10 DIAGNOSIS — Z515 Encounter for palliative care: Secondary | ICD-10-CM

## 2023-04-10 DIAGNOSIS — T8484XA Pain due to internal orthopedic prosthetic devices, implants and grafts, initial encounter: Secondary | ICD-10-CM

## 2023-04-10 DIAGNOSIS — R188 Other ascites: Secondary | ICD-10-CM | POA: Diagnosis not present

## 2023-04-10 MED ORDER — LIDOCAINE HCL (PF) 1 % IJ SOLN
10.0000 mL | Freq: Once | INTRAMUSCULAR | Status: AC
  Administered 2023-04-10: 10 mL via INTRADERMAL
  Filled 2023-04-10: qty 10

## 2023-04-10 MED ORDER — OXYCODONE HCL 15 MG PO TABS: 15.0000 mg | ORAL_TABLET | ORAL | 0 refills | Status: DC | PRN

## 2023-04-10 MED ORDER — ALBUMIN HUMAN 25 % IV SOLN: 25.0000 g | Freq: Once | INTRAVENOUS | Status: AC

## 2023-04-10 MED ORDER — ALBUMIN HUMAN 25 % IV SOLN
INTRAVENOUS | Status: AC
  Administered 2023-04-10: 25 g via INTRAVENOUS
  Filled 2023-04-10: qty 100

## 2023-04-10 MED ORDER — ALBUMIN HUMAN 25 % IV SOLN
INTRAVENOUS | Status: AC
  Filled 2023-04-10: qty 100

## 2023-04-10 MED ORDER — ALBUMIN HUMAN 25 % IV SOLN
25.0000 g | Freq: Once | INTRAVENOUS | Status: AC
  Administered 2023-04-10: 25 g via INTRAVENOUS

## 2023-04-10 NOTE — Telephone Encounter (Signed)
     Chief Complaint: Severe pain right foot. "Dr. Kirtland Bouchard said he could increase my pain medicine and help me get it early. I'm completely out of my medicine." Symptoms: Pain Frequency: 8 years per pt. Pertinent Negatives: Patient denies  Disposition: [] ED /[] Urgent Care (no appt availability in office) / [] Appointment(In office/virtual)/ []  [place] Virtual [facility]/ [] Home Care/ [x] Refused Recommended Disposition /[] [place] Mobile Bus/ [x]  Follow-up with PCP Additional Notes: Please advise pt. States "I have to have fluid drawn off today so he can leave me a message." Reason for Disposition  [1] SEVERE pain (e.g., excruciating, unable to do any normal activities) AND [2] not improved after 2 hours of pain medicine  Answer Assessment - Initial Assessment Questions 1. ONSET: "When did the pain start?"      Years ago 2. LOCATION: "Where is the pain located?"      Right foot pain 3. PAIN: "How bad is the pain?"    (Scale 1-10; or mild, moderate, severe)  - MILD (1-3): doesn't interfere with normal activities.   - MODERATE (4-7): interferes with normal activities (e.g., work or school) or awakens from sleep, limping.   - SEVERE (8-10): excruciating pain, unable to do any normal activities, unable to walk.      Severe 4. WORK OR EXERCISE: "Has there been any recent work or exercise that involved this part of the body?"      No 5. CAUSE: "What do you think is causing the foot pain?"     Bones are "crushed" 6. OTHER SYMPTOMS: "Do you have any other symptoms?" (e.g., leg pain, rash, fever, numbness)     No 7. PREGNANCY: "Is there any chance you are pregnant?" "When was your last menstrual period?"     N/a  Protocols used: Foot Pain-A-AH

## 2023-04-10 NOTE — Telephone Encounter (Signed)
Patient notifed

## 2023-04-10 NOTE — Procedures (Signed)
PROCEDURE SUMMARY:  Successful ultrasound guided paracentesis from the right lower quadrant.  Yielded 6.2 L of amber-colored fluid.  No immediate complications.  The patient tolerated the procedure well.   Specimen not sent for labs.  EBL < 2 mL  Patient currently undergoing workup at [facility] for possible liver transplant. No plans for formal TIPS evaluation at this time.    Alwyn Ren, [place] [phone] 04/10/2023, 12:17 PM

## 2023-04-10 NOTE — Telephone Encounter (Signed)
[facility] and discussed with them the fill dates. I sent a new rx Oxycodone 15mg  higher dose to pharmacy for fill date today pick up 04/10/23  Please notify patient [facility] to check status of picking up new order Oxycodone. This is a 2 week supply. I will not able to refill ANY sooner than 2 weeks once his picks up these 60 pills  Saralyn Pilar, DO [facility] 04/10/2023, 12:46 PM

## 2023-04-10 NOTE — Addendum Note (Signed)
Addended by: [name] on: 04/10/2023 12:46 PM   Modules accepted: Orders

## 2023-04-11 ENCOUNTER — Ambulatory Visit: Payer: Self-pay

## 2023-04-11 ENCOUNTER — Telehealth: Payer: Self-pay

## 2023-04-11 NOTE — Telephone Encounter (Signed)
  Chief Complaint: In order to get oxy, insurance company has sent papers to be filled BEFORE pt can get higher dose of Oxy Symptoms: severe pain  Disposition: [] ED /[] Urgent Care (no appt availability in office) / [] Appointment(In office/virtual)/ []  [place] Virtual Care/ [] Home Care/ [] Refused Recommended Disposition /[] [place] Mobile [facility]/ [x]  Follow-up with PCP Additional Notes: see note Reason for Disposition  [1] Caller has URGENT medicine question about med that PCP or specialist prescribed AND [2] triager unable to answer question  Answer Assessment - Initial Assessment Questions 1. NAME of MEDICINE: "What medicine(s) are you calling about?"     OxyContin 2. QUESTION: "What is your question?" (e.g., double dose of medicine, side effect)     In order to get oxy, insurance company has sent papers to be filled BEFORE pt can get higher dose of Oxy 3. PRESCRIBER: "Who prescribed the medicine?" Reason: if prescribed by specialist, call should be referred to that group.     Dr. Althea Charon 4. SYMPTOMS: "Do you have any symptoms?" If Yes, ask: "What symptoms are you having?"  "How bad are the symptoms (e.g., mild, moderate, severe)     Severe pain  Protocols used: Medication Question Call-A-AH

## 2023-04-11 NOTE — Telephone Encounter (Signed)
Prior authorization approval received and patient notified of approval. He will contact pharmacy to have them fill.

## 2023-04-11 NOTE — Telephone Encounter (Signed)
Prior authorization for OXYCODONE 15 MG initiated in Cover My Meds.

## 2023-04-11 NOTE — Telephone Encounter (Signed)
Prior authorization has already been initiated for patient. He is aware of this.

## 2023-04-11 NOTE — Telephone Encounter (Signed)
Prior authorization APPROVAL for OXYCODONE 15 mg received:  Approved today by [employer] [place] Medicaid [place] Case: [id number], Status: Approved, Coverage Starts on: 04/11/2023 12:00:00 AM, Coverage Ends on: 10/08/2023 12:00:00 AM. Authorization Expiration Date: 10/07/2023  Patient notified of favorable outcome and he will contact his pharmacy to fill.

## 2023-04-17 ENCOUNTER — Ambulatory Visit
Admission: RE | Admit: 2023-04-17 | Discharge: 2023-04-17 | Disposition: A | Discharge status: Home / Self Care | Payer: Medicaid Other | Source: Ambulatory Visit | Attending: Transplant Hepatology | Admitting: Transplant Hepatology

## 2023-04-17 DIAGNOSIS — R188 Other ascites: Secondary | ICD-10-CM | POA: Diagnosis not present

## 2023-04-17 DIAGNOSIS — K7460 Unspecified cirrhosis of liver: Secondary | ICD-10-CM | POA: Diagnosis not present

## 2023-04-17 MED ORDER — ALBUMIN HUMAN 25 % IV SOLN
25.0000 g | Freq: Once | INTRAVENOUS | Status: AC
  Administered 2023-04-17: 25 g via INTRAVENOUS

## 2023-04-17 MED ORDER — ALBUMIN HUMAN 25 % IV SOLN
INTRAVENOUS | Status: AC
  Filled 2023-04-17: qty 100

## 2023-04-17 MED ORDER — LIDOCAINE HCL (PF) 1 % IJ SOLN
10.0000 mL | Freq: Once | INTRAMUSCULAR | Status: AC
  Administered 2023-04-17: 10 mL via INTRADERMAL
  Filled 2023-04-17: qty 10

## 2023-04-21 ENCOUNTER — Other Ambulatory Visit: Payer: Self-pay | Admitting: Family Medicine

## 2023-04-21 DIAGNOSIS — K7031 Alcoholic cirrhosis of liver with ascites: Secondary | ICD-10-CM

## 2023-04-21 DIAGNOSIS — T8484XA Pain due to internal orthopedic prosthetic devices, implants and grafts, initial encounter: Secondary | ICD-10-CM

## 2023-04-21 DIAGNOSIS — Z515 Encounter for palliative care: Secondary | ICD-10-CM

## 2023-04-21 MED ORDER — OXYCODONE HCL 15 MG PO TABS: 15.0000 mg | ORAL_TABLET | ORAL | 0 refills | Status: DC | PRN

## 2023-04-21 MED ORDER — LACTULOSE 10 GM/15ML PO SOLN
10.0000 g | Freq: Every day | ORAL | 2 refills | Status: DC
Start: 2023-04-21 — End: 2023-06-02

## 2023-04-21 NOTE — Telephone Encounter (Signed)
Medication Refill - Medication: lactulose (CHRONULAC) 10 GM/15ML solution [[id number]] oxyCODONE (ROXICODONE) 15 MG immediate release tablet [[id number]]   Has the patient contacted their pharmacy? Yes.    (Agent: If yes, when and what did the pharmacy advise?) Contact PCP   Preferred Pharm[facility] (with phone number [facility]   Has the patient been seen for an appointment in the last year OR does the patient have an upcoming appointment? Yes.    Agent: Please be advised that RX refills may take up to 3 business days. We ask that you follow-up with your pharmacy.    Pt is completely out of the Lactulose and has been for 3 days. Pt's sister Liborio Nixon is requesting a call back when it's sent so she can get it for pt.

## 2023-04-21 NOTE — Telephone Encounter (Signed)
Requested medication (s) are due for refill today: Yes  Requested medication (s) are on the active medication list: Yes  Last refill:  Lactulose 02/03/23, Oxycodone 2 weeks ago  Future visit scheduled: Yes  Notes to clinic:  Unable to refill per protocol, last refill by another provider (Lactulose), Unable to refill per protocol, cannot delegate.      Requested Prescriptions  Pending Prescriptions Disp Refills   lactulose (CHRONULAC) 10 GM/15ML solution 236 mL 0    Sig: Take 15 mLs (10 g total) by mouth daily.     Gastroenterology:  Laxatives - lactulose Failed - 04/21/2023  1:04 PM      Failed - K in normal range and within 360 days    Potassium  Date Value Ref Range Status  03/15/2023 3.4 (L) 3.5 - 5.1 mmol/L Final         Failed - Na in normal range and within 360 days    Sodium  Date Value Ref Range Status  03/15/2023 134 (L) 135 - 145 mmol/L Final         Passed - Cl in normal range and within 360 days    Chloride  Date Value Ref Range Status  03/15/2023 106 98 - 111 mmol/L Final         Passed - CO2 in normal range and within 360 days    CO2  Date Value Ref Range Status  03/15/2023 23 22 - 32 mmol/L Final         Passed - Valid encounter within last 12 months    Recent Outpatient Visits           2 weeks ago Painful orthopaedic hardware [facility])   [place] [facility] [place], [name], DO   3 weeks ago Alcoholic cirrhosis of liver with ascites [facility])   [place] [facility] [place], [name], DO   1 month ago Alcoholic cirrhosis of liver with ascites [facility])   [place] [facility] [name], DO   3 months ago Hospital discharge follow-up   [place] [facility] Mecum, Oswaldo Conroy, PA-C   4 months ago No-show for appointment   [place] [facility] [name], DO       Future Appointments             In 1 week Althea Charon,  [name], DO [place] [facility]             oxyCODONE (ROXICODONE) 15 MG immediate release tablet 60 tablet 0    Sig: Take 1 tablet (15 mg total) by mouth every 4 (four) hours as needed for pain.     Not Delegated - Analgesics:  Opioid Agonists Failed - 04/21/2023  1:04 PM      Failed - This refill cannot be delegated      Failed - Urine Drug Screen completed in last 360 days      Passed - Valid encounter within last 3 months    Recent Outpatient Visits           2 weeks ago Painful orthopaedic hardware [facility])   [place] [facility] [place], [name], DO   3 weeks ago Alcoholic cirrhosis of liver with ascites [facility])   [place] [facility] [place], [name], DO   1 month ago Alcoholic cirrhosis of liver with ascites [facility])   [facility] [place]  [facility] Althea Charon, [name], DO   3 months ago Hospital discharge follow-up   [place] [facility] Mecum, Oswaldo Conroy, [place]   4 months ago No-show for appointment   [facility] [name], DO       Future Appointments             In 1 week Althea Charon, [name], DO [place] [facility], [facility]

## 2023-04-24 ENCOUNTER — Ambulatory Visit: Payer: Medicaid Other

## 2023-04-25 ENCOUNTER — Other Ambulatory Visit: Payer: Self-pay | Admitting: Transplant Hepatology

## 2023-04-25 DIAGNOSIS — K7460 Unspecified cirrhosis of liver: Secondary | ICD-10-CM

## 2023-04-26 ENCOUNTER — Ambulatory Visit
Admission: RE | Admit: 2023-04-26 | Discharge: 2023-04-26 | Disposition: A | Discharge status: Home / Self Care | Payer: Medicaid Other | Source: Ambulatory Visit | Attending: Transplant Hepatology | Admitting: Transplant Hepatology

## 2023-04-26 DIAGNOSIS — K7460 Unspecified cirrhosis of liver: Secondary | ICD-10-CM | POA: Diagnosis not present

## 2023-04-26 MED ORDER — LIDOCAINE HCL (PF) 1 % IJ SOLN
10.0000 mL | Freq: Once | INTRAMUSCULAR | Status: AC
  Administered 2023-04-26: 10 mL via INTRADERMAL
  Filled 2023-04-26: qty 10

## 2023-04-26 NOTE — Procedures (Signed)
PROCEDURE SUMMARY:  Successful image-guided paracentesis from the left lower abdomen.  Yielded 3.1 liters of clear yellow fluid.  No immediate complications.  EBL = trace. Patient tolerated well.   Specimen was not sent for labs.  Please see imaging section of Epic for full dictation.   Kennieth Francois PA-C 04/26/2023 10:27 AM

## 2023-05-01 ENCOUNTER — Other Ambulatory Visit: Payer: Self-pay | Admitting: Transplant Hepatology

## 2023-05-01 ENCOUNTER — Ambulatory Visit
Admission: RE | Admit: 2023-05-01 | Discharge: 2023-05-01 | Disposition: A | Discharge status: Home / Self Care | Payer: Medicaid Other | Source: Ambulatory Visit | Attending: Transplant Hepatology | Admitting: Transplant Hepatology

## 2023-05-01 DIAGNOSIS — K7460 Unspecified cirrhosis of liver: Secondary | ICD-10-CM | POA: Diagnosis not present

## 2023-05-02 ENCOUNTER — Other Ambulatory Visit: Payer: Self-pay | Admitting: *Deleted

## 2023-05-02 ENCOUNTER — Ambulatory Visit: Payer: Medicaid Other | Admitting: Family Medicine

## 2023-05-02 NOTE — Patient Instructions (Signed)
Visit Information  Tom [name] was given information about Medicaid Managed Care team care coordination services as a part of their Healthy Blue Medicaid benefit. Tom [name] verbally consented to engagement with the [facility] Managed Care team.   If you are experiencing a medical emergency, please call 911 or report to your local emergency department or urgent care.   If you have a non-emergency medical problem during routine business hours, please contact your provider's office and ask to speak with a nurse.   For questions related to your Healthy [facility] health plan, please call: [phone] or visit the homepage here: [email or web address]  If you would like to schedule transportation through your Healthy [facility] plan, please call the following number at least 2 days in advance of your appointment: [phone]  For information about your ride after you set it up, call Ride Assist at [phone]. Use this number to activate a Will Call pickup, or if your transportation is late for a scheduled pickup. Use this number, too, if you need to make a change or cancel a previously scheduled reservation.  If you need transportation services right away, call [phone]. The after-hours call center is staffed 24 hours to handle ride assistance and urgent reservation requests (including discharges) 365 days a year. Urgent trips include sick visits, hospital discharge requests and life-sustaining treatment.  Call the [facility] Line at [phone], at any time, 24 hours a day, 7 days a week. If you are in danger or need immediate medical attention call 911.  If you would like help to quit smoking, call 1-800-QUIT-NOW ((334) 195-1836) OR Espaol: 1-855-Djelo-Ya (4-742-595-6387) o para ms informacin haga clic aqu or Text READY to 564-332 to register via text  Tom [name],   Patient verbalizes understanding of  instructions and care plan provided today and agrees to view in MyChart. Active MyChart status and patient understanding of how to access instructions and care plan via MyChart confirmed with patient.     Telephone follow up appointment with Managed Medicaid care management team member scheduled for:05/05/23 at 1pm  [name] RN, BSN [place]  Value-Based Care Institute [facility] RN Care Coordinator [phone]   Following is a copy of your plan of care:  Care Plan : RN Care Manager Plan of Care  Updates made by [name], RN since 05/02/2023 12:00 AM     Problem: Health Managment needs related to Liver Cirrhosis      Long-Range Goal: Development of Plan of Care to address Health Managment needs related to Liver Cirrhosis   Start Date: 02/01/2023  Expected End Date: 05/02/2023  Note:   Current Barriers:  Knowledge Deficits related to plan of care for management of Liver Cirrohsis Tom [name] is not feeling well today. He forgot about his PCP appointment later today.  RNCM Clinical Goal(s):  Patient will verbalize understanding of plan for management of Liver Cirrhosis as evidenced by patient reports attend all scheduled medical appointments: 05/02/23 with PCP and weekly paracentesis as evidenced by provider documentation in EMR        continue to work with RN Care Manager and/or Social Worker to address care management and care coordination needs related to Liver Cirrhosis as evidenced by adherence to CM Team Scheduled appointments     through collaboration with RN Care manager, provider, and care team.   Interventions: Evaluation of current treatment plan related to  self management and patient's adherence to plan as established by provider   Liver Cirrhosis  (Status:  Goal on Track (progressing): YES.) Long Term Goal  Evaluation of current treatment plan related to  Liver Cirrhosis ,  self-management and patient's adherence to plan as established by  provider. Discussed plans with patient for ongoing care management follow up and provided patient with direct contact information for care management team Advised patient to keep a calendar of visits, providers and contact information; Reviewed medications with patient and discussed pain management regime; Assessed social determinant of health barriers;  Advised patient to discuss any concerns or questions regarding liver transplant with Hepatology-discussed sending MyChart message is the best way to communicate  Discussed the importance of PCP visit today-Patient will contact his sister to take him to PCP appointment today   Patient Goals/Self-Care Activities: Take medications as prescribed   Attend all scheduled provider appointments Call provider office for new concerns or questions

## 2023-05-02 NOTE — Patient Outreach (Signed)
Medicaid Managed Care   Nurse Care Manager Note  05/02/2023 Name:  Tom [name] MRN:  [id number] DOB:  09-13-1965  Tom [name] is an 57 y.o. year old male who is a primary patient of [name], DO.  The [facility] Managed Care Coordination team was consulted for assistance with:    Liver Cirrhosis  Tom [name] was given information about Medicaid Managed Care Coordination team services today. Tom [name] Patient agreed to services and verbal consent obtained.  Engaged with patient by telephone for follow up visit in response to provider referral for case management and/or care coordination services.   Assessments/Interventions:  Review of past medical history, allergies, medications, health status, including review of consultants reports, laboratory and other test data, was performed as part of comprehensive evaluation and provision of chronic care management services.  SDOH (Social Determinants of Health) assessments and interventions performed: SDOH Interventions    Flowsheet Row Telephone from 01/11/2023 in [place] POPULATION HEALTH DEPARTMENT ED to Hosp-Admission (Discharged) from 12/26/2022 in [facility] to Hosp-Admission (Discharged) from 12/15/2022 in [facility] Patient Outreach Telephone from 10/19/2022 in [place] POPULATION HEALTH DEPARTMENT Telephone from 09/28/2022 in [place] POPULATION HEALTH DEPARTMENT  SDOH Interventions       Transportation Interventions Intervention Not Indicated Inpatient TOC, Other (Comment) Inpatient TOC -- Intervention Not Indicated  Utilities Interventions -- -- -- Intervention Not Indicated --       Care Plan  Allergies  Allergen Reactions   Alfuzosin Other (See Comments)    Dizziness    Amlodipine Nausea And Vomiting   Bee Venom Swelling   Cymbalta [Duloxetine Hcl] Other (See Comments)    Emesis     Duloxetine Other (See Comments)    Emesis   Lisinopril    Flomax [Tamsulosin Hcl] Itching    Medications Reviewed Today   Medications were not reviewed in this encounter     Patient Active Problem List   Diagnosis Date Noted   Abdominal pain 01/16/2023   Anxiety 01/16/2023   Chronic kidney disease, stage 3a (HCC) 01/16/2023   Cellulitis of right lower extremity 12/31/2022   Obesity (BMI 30-39.9) 12/27/2022   Cellulitis of right foot 12/26/2022   Hyponatremia 12/17/2022   End stage liver disease (HCC) 12/15/2022   Dyslipidemia 12/04/2022   COPD with asthma (HCC) 12/04/2022   GERD without esophagitis 12/04/2022   Abdominal distension 12/04/2022   Shortness of breath 12/04/2022   Ascites 12/03/2022   Right foot pain 11/15/2022   AKI (acute kidney injury) (HCC) 11/15/2022   Elevated lactic acid level 11/15/2022   Complication associated with orthopedic device (HCC) 10/21/2022   Alcoholic cirrhosis of liver with ascites (HCC) 07/04/2022   Former smoker 09/16/2021   Asthmatic bronchitis , chronic (HCC) 09/16/2021   Pre-diabetes 08/13/2021   Morbid obesity due to excess calories (HCC) 08/13/2021   DOE (dyspnea on exertion) 04/13/2021   Acute pain of left wrist 03/25/2021   DDD (degenerative disc disease), lumbar 12/16/2020   Chronic back pain 12/16/2020   Arthritis of right ankle 05/08/2017   Atherosclerosis of native coronary artery of native heart without angina pectoris 01/06/2015   Essential hypertension 10/03/2012   Osteoarthritis 12/06/2011    Conditions to be addressed/monitored per PCP order:   Liver Cirrhosis  Care Plan : RN Care Manager Plan of Care  Updates made by [name], RN since 05/02/2023 12:00 AM     Problem: Health Managment needs  related to Liver Cirrhosis      Long-Range Goal: Development of Plan of Care to address Health Managment needs related to Liver Cirrhosis   Start Date: 02/01/2023  Expected End Date: 05/02/2023  Note:   Current  Barriers:  Knowledge Deficits related to plan of care for management of Liver Cirrohsis [name] is not feeling well today. He forgot about his PCP appointment later today.  RNCM Clinical Goal(s):  Patient will verbalize understanding of plan for management of Liver Cirrhosis as evidenced by patient reports attend all scheduled medical appointments: 05/02/23 with PCP and weekly paracentesis as evidenced by provider documentation in EMR        continue to work with RN Care Manager and/or Social Worker to address care management and care coordination needs related to Liver Cirrhosis as evidenced by adherence to CM Team Scheduled appointments     through collaboration with RN Care manager, provider, and care team.   Interventions: Evaluation of current treatment plan related to  self management and patient's adherence to plan as established by provider   Liver Cirrhosis  (Status: Goal on Track (progressing): YES.) Long Term Goal  Evaluation of current treatment plan related to  Liver Cirrhosis ,  self-management and patient's adherence to plan as established by provider. Discussed plans with patient for ongoing care management follow up and provided patient with direct contact information for care management team Advised patient to keep a calendar of visits, providers and contact information; Reviewed medications with patient and discussed pain management regime; Assessed social determinant of health barriers;  Advised patient to discuss any concerns or questions regarding liver transplant with Hepatology-discussed sending MyChart message is the best way to communicate  Discussed the importance of PCP visit today-Patient will contact his sister to take him to PCP appointment today   Patient Goals/Self-Care Activities: Take medications as prescribed   Attend all scheduled provider appointments Call provider office for new concerns or questions        Follow Up:  Patient agrees to  Care Plan and Follow-up.  Plan: The Managed Medicaid care management team will reach out to the patient again over the next 7 days.  Date/time of next scheduled RN care management/care coordination outreach:  05/05/23 at 1pm  [name] RN, BSN [place]  Value-Based Care Institute [facility] RN Care Coordinator [phone]

## 2023-05-03 ENCOUNTER — Other Ambulatory Visit: Payer: Self-pay | Admitting: Family Medicine

## 2023-05-03 MED ORDER — OXYCODONE HCL 15 MG PO TABS: 15.0000 mg | ORAL_TABLET | ORAL | 0 refills | Status: DC | PRN

## 2023-05-03 NOTE — Addendum Note (Signed)
Addended by: [name] on: 05/03/2023 05:53 PM   Modules accepted: Orders

## 2023-05-03 NOTE — Telephone Encounter (Signed)
Reviewed chart. Patient may be ready for refill.  Oxycodone Rx sent today 05/03/23  oxyCODONE (ROXICODONE) 15 MG immediate release tablet 60 tablet 0 05/03/2023 --   Sig - Route: Take 1 tablet (15 mg total) by mouth every 4 (four) hours as needed for pain. - Oral   Sent to pharmacy as: oxyCODONE (ROXICODONE) 15 MG immediate release tablet   Earliest Fill Date: 05/03/2023   E-Prescribing Status: Receipt confirmed by pharmacy (05/03/2023  5:53 PM EST)    Saralyn Pilar, DO [facility] 05/03/2023, 5:54 PM

## 2023-05-05 ENCOUNTER — Other Ambulatory Visit: Payer: Self-pay | Admitting: *Deleted

## 2023-05-05 NOTE — Patient Instructions (Signed)
Visit Information  Mr. [name]  - as a part of your Medicaid benefit, you are eligible for care management and care coordination services at no cost or copay. I was unable to reach you by phone today but would be happy to help you with your health related needs. Please feel free to call me @ [phone].   A member of the Managed Medicaid care management team will reach out to you again over the next 7 days.   [name] RN, BSN [place]  Value-Based Care Institute [facility] RN Care Coordinator [phone]

## 2023-05-05 NOTE — Patient Outreach (Signed)
  Medicaid Managed Care   Unsuccessful Attempt Note   05/05/2023 Name: AUBRY RIEHM MRN: [id number] DOB: [birth year]  Referred by: [name], DO Reason for referral : High Risk Managed Medicaid (Unsuccessful RNCM follow up telephone outreach)   An unsuccessful telephone outreach was attempted today. The patient was referred to the case management team for assistance with care management and care coordination.    Follow Up Plan: A HIPAA compliant phone message was left for the patient providing contact information and requesting a return call. and The Managed Medicaid care management team will reach out to the patient again over the next 7 days.    [name] RN, BSN [place]  Value-Based Care Institute [facility] RN Care Coordinator [phone]

## 2023-05-08 ENCOUNTER — Ambulatory Visit
Admission: RE | Admit: 2023-05-08 | Discharge: 2023-05-08 | Disposition: A | Discharge status: Home / Self Care | Payer: Medicaid Other | Source: Ambulatory Visit | Attending: Transplant Hepatology | Admitting: Transplant Hepatology

## 2023-05-08 ENCOUNTER — Other Ambulatory Visit: Payer: Self-pay | Admitting: Transplant Hepatology

## 2023-05-08 DIAGNOSIS — R188 Other ascites: Secondary | ICD-10-CM | POA: Diagnosis not present

## 2023-05-08 DIAGNOSIS — K7460 Unspecified cirrhosis of liver: Secondary | ICD-10-CM | POA: Diagnosis not present

## 2023-05-08 NOTE — Progress Notes (Signed)
Patient presents for therapeutic  paracentesis. [place] limited abdomin shows small amount of peritoneal fluid noted. Patient elected to defer the procedure at this time.  Procedure not performed.

## 2023-05-09 ENCOUNTER — Other Ambulatory Visit: Payer: Self-pay | Admitting: Family Medicine

## 2023-05-09 DIAGNOSIS — Z515 Encounter for palliative care: Secondary | ICD-10-CM

## 2023-05-09 DIAGNOSIS — T8484XA Pain due to internal orthopedic prosthetic devices, implants and grafts, initial encounter: Secondary | ICD-10-CM

## 2023-05-09 NOTE — Telephone Encounter (Signed)
Medication Refill -  Most Recent Primary Care Visit:  Provider: [name]  Department: [facility]  Visit Type: OFFICE VISIT  Date: 04/06/2023  Medication: albuterol (VENTOLIN HFA) 108 (90 Base) MCG/ACT inhaler  oxyCODONE (ROXICODONE) 15 MG immediate release tablet   Has the patient contacted their pharmacy? Yes  Is this the correct pharmacy for this prescription? Yes This is the patient's preferred pharmacy:  [id number] - [place], [place] - [address] [place] [address] Phone: [phone] Fax: [phone]     Has the prescription been filled recently? Yes  Is the patient out of the medication? Yes  Has the patient been seen for an appointment in the last year OR does the patient have an upcoming appointment? Yes  Can we respond through MyChart? No  Agent: Please be advised that Rx refills may take up to 3 business days. We ask that you follow-up with your pharmacy.

## 2023-05-10 MED ORDER — OXYCODONE HCL 15 MG PO TABS: 15.0000 mg | ORAL_TABLET | ORAL | 0 refills | Status: DC | PRN

## 2023-05-10 MED ORDER — ALBUTEROL SULFATE HFA 108 (90 BASE) MCG/ACT IN AERS: 2.0000 | INHALATION_SPRAY | RESPIRATORY_TRACT | 0 refills | Status: DC | PRN

## 2023-05-10 NOTE — Telephone Encounter (Signed)
Requested medication (s) are due for refill today: Yes  Requested medication (s) are on the active medication list: Yes  Last refill:  Inhaler 04/06/23, Oxycodone 05/03/23  Future visit scheduled: Yes  Notes to clinic:  Unable to refill per protocol, cannot delegate.      Requested Prescriptions  Pending Prescriptions Disp Refills   albuterol (VENTOLIN HFA) 108 (90 Base) MCG/ACT inhaler 18 each 0    Sig: Inhale 2 puffs into the lungs every 4 (four) hours as needed.     Pulmonology:  Beta Agonists 2 Passed - 05/09/2023  1:18 PM      Passed - Last BP in normal range    BP Readings from Last 1 Encounters:  04/26/23 124/65         Passed - Last Heart Rate in normal range    Pulse Readings from Last 1 Encounters:  04/26/23 92         Passed - Valid encounter within last 12 months    Recent Outpatient Visits           1 month ago Painful orthopaedic hardware [facility])   [place] [facility] [place], [name], DO   1 month ago Alcoholic cirrhosis of liver with ascites [facility])   [place] [facility] [place], [name], DO   2 months ago Alcoholic cirrhosis of liver with ascites [facility])   [place] [facility] [name], DO   3 months ago Hospital discharge follow-up   [place] [facility] Mecum, Oswaldo Conroy, PA-C   5 months ago No-show for appointment   [facility] [name], DO       Future Appointments             In 6 days Althea Charon, [name], DO [address] [facility]             oxyCODONE (ROXICODONE) 15 MG immediate release tablet 60 tablet 0    Sig: Take 1 tablet (15 mg total) by mouth every 4 (four) hours as needed for pain.     Not Delegated - Analgesics:  Opioid Agonists Failed - 05/09/2023  1:18 PM      Failed - This refill cannot be delegated      Failed - Urine Drug Screen completed in last  360 days      Passed - Valid encounter within last 3 months    Recent Outpatient Visits           1 month ago Painful orthopaedic hardware [facility])   [place] [facility] [place], [name], DO   1 month ago Alcoholic cirrhosis of liver with ascites [facility])   [place] [facility] [place], [name], DO   2 months ago Alcoholic cirrhosis of liver with ascites [facility])   [place] [facility] [name], DO   3 months ago Hospital discharge follow-up   [facility] [place] Mecum, Oswaldo Conroy, PA-C   5 months ago No-show for appointment   [facility] [name], DO       Future Appointments             In 6 days Althea Charon, [name], DO [place] [facility]

## 2023-05-12 ENCOUNTER — Other Ambulatory Visit: Payer: Self-pay | Admitting: *Deleted

## 2023-05-12 NOTE — Patient Instructions (Signed)
Visit Information  [name] was given information about Medicaid Managed Care team care coordination services as a part of their Healthy Blue Medicaid benefit. Tom [name] verbally consented to engagement with the [facility] Managed Care team.   If you are experiencing a medical emergency, please call 911 or report to your local emergency department or urgent care.   If you have a non-emergency medical problem during routine business hours, please contact your provider's office and ask to speak with a nurse.   For questions related to your Healthy [facility] health plan, please call: [phone] or visit the homepage here: [email or web address]  If you would like to schedule transportation through your Healthy [facility] plan, please call the following number at least 2 days in advance of your appointment: [phone]  For information about your ride after you set it up, call Ride Assist at [phone]. Use this number to activate a Will Call pickup, or if your transportation is late for a scheduled pickup. Use this number, too, if you need to make a change or cancel a previously scheduled reservation.  If you need transportation services right away, call [phone]. The after-hours call center is staffed 24 hours to handle ride assistance and urgent reservation requests (including discharges) 365 days a year. Urgent trips include sick visits, hospital discharge requests and life-sustaining treatment.  Call the [facility] Line at [phone], at any time, 24 hours a day, 7 days a week. If you are in danger or need immediate medical attention call 911.  If you would like help to quit smoking, call 1-800-QUIT-NOW (317-450-5540) OR Espaol: 1-855-Djelo-Ya (4-742-595-6387) o para ms informacin haga clic aqu or Text READY to 564-332 to register via text  [name],   Please see education materials related to  Pain Management provided by MyChart link.  Patient verbalizes understanding of instructions and care plan provided today and agrees to view in MyChart. Active MyChart status and patient understanding of how to access instructions and care plan via MyChart confirmed with patient.     Telephone follow up appointment with Managed Medicaid care management team member scheduled for:06/16/23 at 1:15pm  Tom Emms RN, BSN [place]  Value-Based Care Institute [facility] RN Care Coordinator [phone]   Following is a copy of your plan of care:  Care Plan : RN Care Manager Plan of Care  Updates made by Tom Dach, RN since 05/12/2023 12:00 AM     Problem: Health Managment needs related to Liver Cirrhosis      Long-Range Goal: Development of Plan of Care to address Health Managment needs related to Liver Cirrhosis   Start Date: 02/01/2023  Expected End Date: 06/20/2023  Note:   Current Barriers:  Knowledge Deficits related to plan of care for management of Liver Cirrohsis Tom [name] reports increased pain in his feet. He is scheduled with PCP to discuss pain management.   RNCM Clinical Goal(s):  Patient will verbalize understanding of plan for management of Liver Cirrhosis as evidenced by patient reports attend all scheduled medical appointments: 05/16/23 with PCP, Hepatology on 05/25/23 and weekly paracentesis as evidenced by provider documentation in EMR        continue to work with RN Care Manager and/or Social Worker to address care management and care coordination needs related to Liver Cirrhosis as evidenced by adherence to CM Team Scheduled appointments     through collaboration with RN Care manager, provider, and care team.   Interventions: Evaluation of current treatment plan related  to  self management and patient's adherence to plan as established by provider Provided education on pain management   Liver Cirrhosis  (Status: Goal on Track (progressing):  YES.) Long Term Goal  Evaluation of current treatment plan related to  Liver Cirrhosis ,  self-management and patient's adherence to plan as established by provider. Discussed plans with patient for ongoing care management follow up and provided patient with direct contact information for care management team Advised patient to keep a calendar of visits, providers and contact information; Reviewed medications with patient and discussed pain management regime-recent dose increase; Assessed social determinant of health barriers;  Advised patient to discuss any concerns or questions regarding liver transplant with Hepatology-discussed sending MyChart message is the best way to communicate  Discussed the importance of attending upcoming PCP appointment, attempted to contact patient's sister to ensure transportation Advised patient to reach out to his sister to discuss upcoming appointments   Patient Goals/Self-Care Activities: Take medications as prescribed   Attend all scheduled provider appointments Call provider office for new concerns or questions

## 2023-05-12 NOTE — Patient Outreach (Signed)
Medicaid Managed Care   Nurse Care Manager Note  05/12/2023 Name:  Tom [name] MRN:  [id number] DOB:  Sep 27, 1965  Tom [name] is an 57 y.o. year old male who is a primary patient of [name], DO.  The [facility] Managed Care Coordination team was consulted for assistance with:    Liver Cirrhosis  [name] was given information about Medicaid Managed Care Coordination team services today. Tom [name] Patient agreed to services and verbal consent obtained.  Engaged with patient by telephone for follow up visit in response to provider referral for case management and/or care coordination services.   Assessments/Interventions:  Review of past medical history, allergies, medications, health status, including review of consultants reports, laboratory and other test data, was performed as part of comprehensive evaluation and provision of chronic care management services.  SDOH (Social Determinants of Health) assessments and interventions performed: SDOH Interventions    Flowsheet Row Patient Outreach Telephone from 05/12/2023 in [place] [facility] DEPARTMENT Telephone from 01/11/2023 in [place] POPULATION [facility] DEPARTMENT ED to Hosp-Admission (Discharged) from 12/26/2022 in [facility] to Hosp-Admission (Discharged) from 12/15/2022 in [facility] Patient Outreach Telephone from 10/19/2022 in [place] POPULATION [facility] DEPARTMENT Telephone from 09/28/2022 in [place] POPULATION HEALTH DEPARTMENT  SDOH Interventions        Food Insecurity Interventions Intervention Not Indicated -- -- -- -- --  Housing Interventions Intervention Not Indicated -- -- -- -- --  Transportation Interventions Intervention Not Indicated Intervention Not Indicated Inpatient TOC, Other (Comment) Inpatient TOC -- Intervention Not Indicated  Utilities Interventions Intervention Not  Indicated -- -- -- Intervention Not Indicated --       Care Plan  Allergies  Allergen Reactions   Alfuzosin Other (See Comments)    Dizziness    Amlodipine Nausea And Vomiting   Bee Venom Swelling   Cymbalta [Duloxetine Hcl] Other (See Comments)    Emesis    Duloxetine Other (See Comments)    Emesis   Lisinopril    Flomax [Tamsulosin Hcl] Itching    Medications Reviewed Today     Reviewed by [name], RN (Registered Nurse) on 05/12/23 at 1358  Med List Status: <None>   Medication Order Taking? Sig Documenting Provider Last Dose Status Informant  albuterol (PROVENTIL) (2.5 MG/3ML) 0.083% nebulizer solution [id number] Yes Take 3 mLs (2.5 mg total) by nebulization every 4 (four) hours as needed for wheezing or shortness of breath. [name], MD Taking Active Spouse/Significant Other  albuterol (VENTOLIN HFA) 108 (90 Base) MCG/ACT inhaler [id number] Yes Inhale 2 puffs into the lungs every 4 (four) hours as needed. [name], DO Taking Active   budesonide-formoterol (SYMBICORT) 80-4.5 MCG/ACT inhaler [id number] Yes TAKE 2 PUFFS FIRST THING IN AM AND THEN ANOTHER 2 PUFFS ABOUT 12 HOURS LATER. [name], DO Taking Active   EPINEPHrine (EPIPEN 2-PAK) 0.3 mg/0.3 mL IJ SOAJ injection [id number]  Inject 0.3 mg into the muscle as needed for anaphylaxis.  Patient not taking: Reported on 04/06/2023   [name], DO  Active Spouse/Significant Other  famotidine (PEPCID) 20 MG tablet [id number] Yes Take 1 tablet (20 mg total) by mouth daily after supper. One after supper [name], DO Taking Active Spouse/Significant Other  Homeopathic Products (LIVER SUPPORT SL) [id number] Yes Place under the tongue. [provider] Taking Active Spouse/Significant Other  hydrOXYzine (VISTARIL) 25 MG capsule [id number] No Take 1 capsule (25 mg total)  by mouth every 8 (eight) hours as needed.  Patient not taking: Reported on 04/06/2023    [name], DO Unknown Active   lactulose [facility]) 10 GM/15ML solution [id number] Yes Take 15 mLs (10 g total) by mouth daily. [name], DO Taking Active   magnesium oxide (MAG-OX) 400 (240 Mg) MG tablet [id number] Yes Take 2 tablets by mouth 2 (two) times daily. [provider] Taking Active Spouse/Significant Other  Multiple Vitamin (MULTIVITAMIN) capsule [id number] Yes Take 1 capsule by mouth daily. [provider] Taking Active Spouse/Significant Other  ondansetron (ZOFRAN-ODT) 4 MG disintegrating tablet [id number] Yes Take 1 tablet (4 mg total) by mouth every 8 (eight) hours as needed for nausea or vomiting. [name], DO Taking Active   oxyCODONE (ROXICODONE) 15 MG immediate release tablet [id number] Yes Take 1 tablet (15 mg total) by mouth every 4 (four) hours as needed for pain. [name], DO Taking Active   pantoprazole (PROTONIX) 40 MG tablet [id number] No Take 1 tablet (40 mg total) by mouth daily before breakfast. [name], DO Unknown Active Spouse/Significant Other  pramipexole (MIRAPEX) 0.5 MG tablet [id number] Yes Start taking HALF tablet for dose 0.25mg  2 hour before bed. If not effective can take 1 whole tablet for dose 0.5mg  nightly if needed for RLS. [name], DO Taking Active   spironolactone (ALDACTONE) 25 MG tablet [id number] Yes Take 2 tablets (50 mg total) by mouth daily. [name], DO Taking Active   thiamine (VITAMIN B1) 100 MG tablet [id number] No Take 1 tablet (100 mg total) by mouth daily.  Patient not taking: Reported on 04/06/2023   [name], DO Not Taking Active             Patient Active Problem List   Diagnosis Date Noted   Abdominal pain 01/16/2023   Anxiety 01/16/2023   Chronic kidney disease, stage 3a (HCC) 01/16/2023   Cellulitis of right lower extremity 12/31/2022   Obesity (BMI 30-39.9) 12/27/2022   Cellulitis of  right foot 12/26/2022   Hyponatremia 12/17/2022   End stage liver disease (HCC) 12/15/2022   Dyslipidemia 12/04/2022   COPD with asthma (HCC) 12/04/2022   GERD without esophagitis 12/04/2022   Abdominal distension 12/04/2022   Shortness of breath 12/04/2022   Ascites 12/03/2022   Right foot pain 11/15/2022   AKI (acute kidney injury) (HCC) 11/15/2022   Elevated lactic acid level 11/15/2022   Complication associated with orthopedic device (HCC) 10/21/2022   Alcoholic cirrhosis of liver with ascites (HCC) 07/04/2022   Former smoker 09/16/2021   Asthmatic bronchitis , chronic (HCC) 09/16/2021   Pre-diabetes 08/13/2021   Morbid obesity due to excess calories (HCC) 08/13/2021   DOE (dyspnea on exertion) 04/13/2021   Acute pain of left wrist 03/25/2021   DDD (degenerative disc disease), lumbar 12/16/2020   Chronic back pain 12/16/2020   Arthritis of right ankle 05/08/2017   Atherosclerosis of native coronary artery of native heart without angina pectoris 01/06/2015   Essential hypertension 10/03/2012   Osteoarthritis 12/06/2011    Conditions to be addressed/monitored per PCP order:   Liver Cirrhosis  Care Plan : RN Care Manager Plan of Care  Updates made by [name], RN since 05/12/2023 12:00 AM     Problem: Health Managment needs related to Liver Cirrhosis      Long-Range Goal: Development of Plan of Care to address Health Managment needs related to Liver Cirrhosis   Start Date: 02/01/2023  Expected End Date: 06/20/2023  Note:   Current Barriers:  Knowledge Deficits related to plan of care for management of Liver Cirrohsis Mr. Schult reports increased pain in his feet. He is scheduled with PCP to discuss pain management.   RNCM Clinical Goal(s):  Patient will verbalize understanding of plan for management of Liver Cirrhosis as evidenced by patient reports attend all scheduled medical appointments: 05/16/23 with PCP, Hepatology on 05/25/23 and weekly paracentesis  as evidenced by provider documentation in EMR        continue to work with RN Care Manager and/or Social Worker to address care management and care coordination needs related to Liver Cirrhosis as evidenced by adherence to CM Team Scheduled appointments     through collaboration with RN Care manager, provider, and care team.   Interventions: Evaluation of current treatment plan related to  self management and patient's adherence to plan as established by provider Provided education on pain management   Liver Cirrhosis  (Status: Goal on Track (progressing): YES.) Long Term Goal  Evaluation of current treatment plan related to  Liver Cirrhosis ,  self-management and patient's adherence to plan as established by provider. Discussed plans with patient for ongoing care management follow up and provided patient with direct contact information for care management team Advised patient to keep a calendar of visits, providers and contact information; Reviewed medications with patient and discussed pain management regime-recent dose increase; Assessed social determinant of health barriers;  Advised patient to discuss any concerns or questions regarding liver transplant with Hepatology-discussed sending MyChart message is the best way to communicate  Discussed the importance of attending upcoming PCP appointment, attempted to contact patient's sister to ensure transportation Advised patient to reach out to his sister to discuss upcoming appointments   Patient Goals/Self-Care Activities: Take medications as prescribed   Attend all scheduled provider appointments Call provider office for new concerns or questions        Follow Up:  Patient agrees to Care Plan and Follow-up.  Plan: The Managed Medicaid care management team will reach out to the patient again over the next 30 days.  Date/time of next scheduled RN care management/care coordination outreach:  06/16/23 at 1:15pm  [name] RN,  BSN [place]  [facility] RN Care Coordinator [phone]

## 2023-05-15 ENCOUNTER — Other Ambulatory Visit: Payer: Self-pay | Admitting: Transplant Hepatology

## 2023-05-15 ENCOUNTER — Ambulatory Visit
Admission: RE | Admit: 2023-05-15 | Discharge: 2023-05-15 | Disposition: A | Discharge status: Home / Self Care | Payer: Medicaid Other | Source: Ambulatory Visit | Attending: Transplant Hepatology | Admitting: Transplant Hepatology

## 2023-05-15 DIAGNOSIS — R188 Other ascites: Secondary | ICD-10-CM | POA: Diagnosis not present

## 2023-05-15 DIAGNOSIS — K7460 Unspecified cirrhosis of liver: Secondary | ICD-10-CM | POA: Insufficient documentation

## 2023-05-15 NOTE — Progress Notes (Signed)
Patient presents for therapeutic  paracentesis. [place] limited  shows small amount of peritoneal fluid. After discussion of the risks versus the benefits that patient has elected to defer the procedure at this time.

## 2023-05-16 ENCOUNTER — Ambulatory Visit: Payer: Medicaid Other | Admitting: Family Medicine

## 2023-05-16 ENCOUNTER — Encounter: Payer: Self-pay | Admitting: Family Medicine

## 2023-05-16 VITALS — BP 118/68 | Ht 71.0 in | Wt 227.4 lb

## 2023-05-16 DIAGNOSIS — Z515 Encounter for palliative care: Secondary | ICD-10-CM | POA: Diagnosis not present

## 2023-05-16 DIAGNOSIS — J9801 Acute bronchospasm: Secondary | ICD-10-CM | POA: Diagnosis not present

## 2023-05-16 DIAGNOSIS — K7031 Alcoholic cirrhosis of liver with ascites: Secondary | ICD-10-CM

## 2023-05-16 DIAGNOSIS — T8484XA Pain due to internal orthopedic prosthetic devices, implants and grafts, initial encounter: Secondary | ICD-10-CM

## 2023-05-16 MED ORDER — ALBUTEROL SULFATE HFA 108 (90 BASE) MCG/ACT IN AERS: 2.0000 | INHALATION_SPRAY | RESPIRATORY_TRACT | 5 refills | Status: AC | PRN

## 2023-05-16 MED ORDER — OXYCODONE HCL 15 MG PO TABS: 15.0000 mg | ORAL_TABLET | ORAL | 0 refills | Status: DC | PRN

## 2023-05-16 NOTE — Patient Instructions (Addendum)
Thank you for coming to the office today.  Refill Oxycodone 15mg  order today pick up tomorrow 11/27  Keep up with the [facility] Hepatology doctor on 05/22/23 and then they can advise on how to adjust the paracentesis treatment plan.  Please schedule a Follow-up Appointment to: Return in about 6 weeks (around 06/27/2023) for 6 week follow-up Liver , Pain Med updates.  If you have any other questions or concerns, please feel free to call the office or send a message through MyChart. You may also schedule an earlier appointment if necessary.  Additionally, you may be receiving a survey about your experience at our office within a few days to 1 week by e-mail or mail. We value your feedback.  Saralyn Pilar, DO [facility], [place]

## 2023-05-16 NOTE — Progress Notes (Signed)
Subjective:    Patient ID: Tom [name], male    DOB: 10-28-1965, 57 y.o.   MRN: [id number]  Tom [name] is a 57 y.o. male presenting on 05/16/2023 for Medical Management of Chronic Issues   HPI  Discussed the use of AI scribe software for clinical note transcription with the patient, who gave verbal consent to proceed.  Alcoholic Cirrhosis Need Liver Transplant Chronic R Foot Pain - Painful Orthopedic Hardware Complication  The patient, with a history of liver disease and chronic pain, presents for a follow-up visit. He reports that his pain has been well-managed with oxycodone, which is administered by a caregiver. The patient also mentions needing a refill of his albuterol inhaler.  The patient's caregiver brings up a recent decrease in the need for paracentesis to manage fluid accumulation related to his liver disease. He wonders if this could be due to improved liver function, possibly due to medication compliance. The patient also mentions a period of confusion a few weeks prior, which he attributes to not taking his lactulose. Herbalist Hepatology specialist apt upcoming 05/22/23. - He also has paracentesis scheduled upcoming at regular interval, but as stated less fluid is removed now  The patient also has a history of a foot injury, which is causing ongoing pain and swelling.  - Regarding pain management, he takes Oxycodone 15mg  AS NEEDED 3-4 times per day, no pill bottle today but reported to have 2-3 days left of medication The caregiver inquires about the possibility of the foot healing if the liver disease stabilizes. The patient expresses a desire to continue his current pain management regimen, and there is discussion about the need for a potential plan B if complications arise. The patient's pain medication regimen is clarified, and it is noted that he is taking oxycodone two to four times a day.            05/16/2023    5:40 PM 02/22/2023     2:40 PM 03/21/2022    2:56 PM  Depression screen PHQ 2/9  Decreased Interest 0 0 0  Down, Depressed, Hopeless 0 0 0  PHQ - 2 Score 0 0 0  Altered sleeping 0  0  Tired, decreased energy 0  0  Change in appetite 0  0  Feeling bad or failure about yourself  0  0  Trouble concentrating 0  0  Moving slowly or fidgety/restless 0  0  Suicidal thoughts 0  0  PHQ-9 Score 0  0  Difficult doing work/chores Not difficult at all  Not difficult at all       09/21/2022   11:26 AM 12/16/2020    3:16 PM  GAD 7 : Generalized Anxiety Score  Nervous, Anxious, on Edge 0 0  Control/stop worrying 0 0  Worry too much - different things 0 0  Trouble relaxing 0 0  Restless 0 0  Easily annoyed or irritable 0 0  Afraid - awful might happen 0 0  Total GAD 7 Score 0 0  Anxiety Difficulty Not difficult at all Not difficult at all    Social History   Tobacco Use   Smoking status: Former    Current packs/day: 0.00    Average packs/day: 1 pack/day for 39.0 years (39.0 ttl pk-yrs)    Types: Cigarettes    Start date: 12/1976    Quit date: 12/2015    Years since quitting: 7.4   Smokeless tobacco: Never  Vaping Use   Vaping status:  Never Used  Substance Use Topics   Alcohol use: No   Drug use: Yes    Types: Marijuana    Review of Systems Per HPI unless specifically indicated above     Objective:    BP 118/68   Ht 5\' 11"  (1.803 m)   Wt 227 lb 6.4 oz (103.1 kg)   BMI 31.72 kg/m   Wt Readings from Last 3 Encounters:  05/16/23 227 lb 6.4 oz (103.1 kg)  04/06/23 231 lb (104.8 kg)  03/28/23 220 lb 0.3 oz (99.8 kg)    Physical Exam Vitals and nursing note reviewed.  Constitutional:      General: He is not in acute distress.    Appearance: Normal appearance. He is well-developed. He is not diaphoretic.     Comments: Chronically ill-appearing, uncomfortable due to foot pain, cooperative  HENT:     Head: Normocephalic and atraumatic.  Eyes:     General:        Right eye: No discharge.         Left eye: No discharge.     Conjunctiva/sclera: Conjunctivae normal.  Cardiovascular:     Rate and Rhythm: Normal rate.  Pulmonary:     Effort: Pulmonary effort is normal.  Musculoskeletal:     Right lower leg: Edema present.     Left lower leg: Edema present.     Comments: RLE with walking boot on  Skin:    General: Skin is warm and dry.     Findings: No erythema or rash.  Neurological:     Mental Status: He is alert and oriented to person, place, and time.  Psychiatric:        Mood and Affect: Mood normal.        Behavior: Behavior normal.        Thought Content: Thought content normal.     Comments: Well groomed, good eye contact, normal speech and thoughts     Results for orders placed or performed in visit on 03/27/23  HM COLONOSCOPY  Result Value Ref Range   HM Colonoscopy See Report (in chart) See Report (in chart), Patient Reported      Assessment & Plan:   Problem List Items Addressed This Visit     Alcoholic cirrhosis of liver with ascites (HCC)   Other Visit Diagnoses     Painful orthopaedic hardware (HCC)    -  Primary   Relevant Medications   oxyCODONE (ROXICODONE) 15 MG immediate release tablet (Start on 05/17/2023)   Palliative care patient       Relevant Medications   oxyCODONE (ROXICODONE) 15 MG immediate release tablet (Start on 05/17/2023)   Bronchospasm       Relevant Medications   albuterol (VENTOLIN HFA) 108 (90 Base) MCG/ACT inhaler         Chronic Pain / Orthopedic Hardware Managed with Oxycodone 15mg , 2-4 times daily. No reported side effects or complications. Patient's caregiver is managing medication administration. -Continue Oxycodone 15mg , 2-4 times daily as needed for pain. -Refill prescription for Oxycodone 15mg , quantity 60 for 2 weeks, to be filled on 05/17/2023. -Updated pain contract to reflect current dosage. -We discussed potential risks and complications on chronic opiate therapy and express my concern for risk of  confusion sedation, ultimately if he is requiring more medication or has issues with the medication we would again try to seek consultation with other Pain Management specialist. However this attempt has been unsuccessful in past when we tried multiple referrals that were declined  or patient not accepted as new patient for medicine management.  Asthma Request for Albuterol inhaler refill. -Refill Albuterol inhaler prescription.  Cirrhosis, Alcoholic / Liver Disease Patient reports no recent fluid removed from paracentesis procedures, suggesting possible improvement in fluid balance. Patient to follow up with [facility] Hepatology liver specialist on 05/22/2023. He is awaiting further instruction and management, future liver transplant.  -Continue current liver disease management plan as directed by liver specialist. -Request records from liver specialist to stay updated on patient's condition.  Follow-up In 6 weeks to monitor liver disease and pain management.         No orders of the defined types were placed in this encounter.   Meds ordered this encounter  Medications   albuterol (VENTOLIN HFA) 108 (90 Base) MCG/ACT inhaler    Sig: Inhale 2 puffs into the lungs every 4 (four) hours as needed.    Dispense:  8 each    Refill:  5   oxyCODONE (ROXICODONE) 15 MG immediate release tablet    Sig: Take 1 tablet (15 mg total) by mouth every 4 (four) hours as needed for pain.    Dispense:  60 tablet    Refill:  0    First fill 05/17/2023    Follow up plan: Return in about 6 weeks (around 06/27/2023) for 6 week follow-up Liver , Pain Med updates.   Saralyn Pilar, DO [facility] 05/16/2023, 3:52 PM

## 2023-05-22 ENCOUNTER — Ambulatory Visit: Admission: RE | Admit: 2023-05-22 | Payer: Medicaid Other | Source: Ambulatory Visit

## 2023-05-29 ENCOUNTER — Other Ambulatory Visit: Payer: Self-pay | Admitting: Transplant Hepatology

## 2023-05-29 ENCOUNTER — Ambulatory Visit
Admission: RE | Admit: 2023-05-29 | Discharge: 2023-05-29 | Disposition: A | Discharge status: Home / Self Care | Payer: Medicaid Other | Source: Ambulatory Visit | Attending: Transplant Hepatology | Admitting: Transplant Hepatology

## 2023-05-29 DIAGNOSIS — K7460 Unspecified cirrhosis of liver: Secondary | ICD-10-CM | POA: Diagnosis not present

## 2023-05-29 DIAGNOSIS — R188 Other ascites: Secondary | ICD-10-CM | POA: Diagnosis not present

## 2023-05-29 NOTE — Procedures (Signed)
Patient presented today for possible therapeutic paracentesis. Limited ultrasound examination of the abdomen revealed small volume ascites. Fluid volume insufficient to safely perform procedure. No procedure performed. Images saved in Epic.  Alwyn Ren, [place] [phone] 05/29/2023, 11:55 AM

## 2023-06-01 ENCOUNTER — Other Ambulatory Visit: Payer: Self-pay

## 2023-06-01 ENCOUNTER — Emergency Department: Payer: Medicaid Other

## 2023-06-01 ENCOUNTER — Observation Stay
Admission: EM | Admit: 2023-06-01 | Discharge: 2023-06-02 | Disposition: A | Discharge status: Still In Hospital | Payer: Medicaid Other | Attending: Osteopathic Medicine | Admitting: Osteopathic Medicine

## 2023-06-01 DIAGNOSIS — N1831 Chronic kidney disease, stage 3a: Secondary | ICD-10-CM | POA: Diagnosis not present

## 2023-06-01 DIAGNOSIS — K7460 Unspecified cirrhosis of liver: Secondary | ICD-10-CM | POA: Diagnosis not present

## 2023-06-01 DIAGNOSIS — K7682 Hepatic encephalopathy: Secondary | ICD-10-CM | POA: Diagnosis not present

## 2023-06-01 DIAGNOSIS — K7031 Alcoholic cirrhosis of liver with ascites: Secondary | ICD-10-CM | POA: Diagnosis present

## 2023-06-01 DIAGNOSIS — G2581 Restless legs syndrome: Secondary | ICD-10-CM | POA: Diagnosis present

## 2023-06-01 DIAGNOSIS — I10 Essential (primary) hypertension: Secondary | ICD-10-CM | POA: Diagnosis not present

## 2023-06-01 DIAGNOSIS — D649 Anemia, unspecified: Secondary | ICD-10-CM | POA: Diagnosis present

## 2023-06-01 DIAGNOSIS — R410 Disorientation, unspecified: Secondary | ICD-10-CM | POA: Diagnosis not present

## 2023-06-01 DIAGNOSIS — R4182 Altered mental status, unspecified: Principal | ICD-10-CM | POA: Insufficient documentation

## 2023-06-01 DIAGNOSIS — R112 Nausea with vomiting, unspecified: Secondary | ICD-10-CM

## 2023-06-01 DIAGNOSIS — K5730 Diverticulosis of large intestine without perforation or abscess without bleeding: Secondary | ICD-10-CM | POA: Diagnosis not present

## 2023-06-01 DIAGNOSIS — R791 Abnormal coagulation profile: Secondary | ICD-10-CM | POA: Insufficient documentation

## 2023-06-01 DIAGNOSIS — N50819 Testicular pain, unspecified: Secondary | ICD-10-CM | POA: Diagnosis present

## 2023-06-01 DIAGNOSIS — K219 Gastro-esophageal reflux disease without esophagitis: Secondary | ICD-10-CM | POA: Diagnosis present

## 2023-06-01 DIAGNOSIS — K7210 Chronic hepatic failure without coma: Secondary | ICD-10-CM

## 2023-06-01 DIAGNOSIS — J4489 Other specified chronic obstructive pulmonary disease: Secondary | ICD-10-CM | POA: Diagnosis not present

## 2023-06-01 DIAGNOSIS — K7290 Hepatic failure, unspecified without coma: Secondary | ICD-10-CM | POA: Diagnosis not present

## 2023-06-01 DIAGNOSIS — J449 Chronic obstructive pulmonary disease, unspecified: Secondary | ICD-10-CM | POA: Diagnosis present

## 2023-06-01 DIAGNOSIS — E7220 Disorder of urea cycle metabolism, unspecified: Secondary | ICD-10-CM | POA: Insufficient documentation

## 2023-06-01 DIAGNOSIS — Z791 Long term (current) use of non-steroidal anti-inflammatories (NSAID): Secondary | ICD-10-CM | POA: Diagnosis present

## 2023-06-01 DIAGNOSIS — L299 Pruritus, unspecified: Secondary | ICD-10-CM

## 2023-06-01 DIAGNOSIS — K8689 Other specified diseases of pancreas: Secondary | ICD-10-CM | POA: Diagnosis not present

## 2023-06-01 DIAGNOSIS — R188 Other ascites: Secondary | ICD-10-CM | POA: Diagnosis not present

## 2023-06-01 DIAGNOSIS — E669 Obesity, unspecified: Secondary | ICD-10-CM | POA: Diagnosis not present

## 2023-06-01 LAB — CBC WITH DIFFERENTIAL/PLATELET
Abs Immature Granulocytes: 0.01 10*3/uL (ref 0.00–0.07)
Basophils Absolute: 0.1 10*3/uL (ref 0.0–0.1)
Basophils Relative: 2 %
Eosinophils Absolute: 0.3 10*3/uL (ref 0.0–0.5)
Eosinophils Relative: 5 %
HCT: 31.3 % — ABNORMAL LOW (ref 39.0–52.0)
Hemoglobin: 9.9 g/dL — ABNORMAL LOW (ref 13.0–17.0)
Immature Granulocytes: 0 %
Lymphocytes Relative: 26 %
Lymphs Abs: 1.5 10*3/uL (ref 0.7–4.0)
MCH: 27.1 pg (ref 26.0–34.0)
MCHC: 31.6 g/dL (ref 30.0–36.0)
MCV: 85.8 fL (ref 80.0–100.0)
Monocytes Absolute: 1 10*3/uL (ref 0.1–1.0)
Monocytes Relative: 16 %
Neutro Abs: 2.9 10*3/uL (ref 1.7–7.7)
Neutrophils Relative %: 51 %
Platelets: 274 10*3/uL (ref 150–400)
RBC: 3.65 MIL/uL — ABNORMAL LOW (ref 4.22–5.81)
RDW: 15.7 % — ABNORMAL HIGH (ref 11.5–15.5)
WBC: 5.8 10*3/uL (ref 4.0–10.5)
nRBC: 0 % (ref 0.0–0.2)

## 2023-06-01 LAB — URINALYSIS, W/ REFLEX TO CULTURE (INFECTION SUSPECTED)
Bacteria, UA: NONE SEEN
Bilirubin Urine: NEGATIVE
Glucose, UA: NEGATIVE mg/dL
Ketones, ur: NEGATIVE mg/dL
Leukocytes,Ua: NEGATIVE
Nitrite: NEGATIVE
Protein, ur: NEGATIVE mg/dL
Specific Gravity, Urine: 1.026 (ref 1.005–1.030)
Squamous Epithelial / HPF: 0 /[HPF] (ref 0–5)
pH: 7 (ref 5.0–8.0)

## 2023-06-01 LAB — COMPREHENSIVE METABOLIC PANEL
ALT: 26 U/L (ref 0–44)
AST: 47 U/L — ABNORMAL HIGH (ref 15–41)
Albumin: 3 g/dL — ABNORMAL LOW (ref 3.5–5.0)
Alkaline Phosphatase: 90 U/L (ref 38–126)
Anion gap: 6 (ref 5–15)
BUN: 40 mg/dL — ABNORMAL HIGH (ref 6–20)
CO2: 24 mmol/L (ref 22–32)
Calcium: 10.1 mg/dL (ref 8.9–10.3)
Chloride: 105 mmol/L (ref 98–111)
Creatinine, Ser: 1.27 mg/dL — ABNORMAL HIGH (ref 0.61–1.24)
GFR, Estimated: >60 mL/min (ref >60)
Glucose, Bld: 129 mg/dL — ABNORMAL HIGH (ref 70–99)
Potassium: 3.6 mmol/L (ref 3.5–5.1)
Sodium: 135 mmol/L (ref 135–145)
Total Bilirubin: 1.7 mg/dL — ABNORMAL HIGH (ref <1.2)
Total Protein: 7.8 g/dL (ref 6.5–8.1)

## 2023-06-01 LAB — APTT: aPTT: 39 s — ABNORMAL HIGH (ref 24–36)

## 2023-06-01 LAB — PROTIME-INR
INR: 1.4 — ABNORMAL HIGH (ref 0.8–1.2)
Prothrombin Time: 17.3 s — ABNORMAL HIGH (ref 11.4–15.2)

## 2023-06-01 LAB — LACTATE DEHYDROGENASE: LDH: 188 U/L (ref 98–192)

## 2023-06-01 LAB — AMMONIA: Ammonia: 75 umol/L — ABNORMAL HIGH (ref 9–35)

## 2023-06-01 MED ORDER — ADULT MULTIVITAMIN W/MINERALS CH
1.0000 | ORAL_TABLET | Freq: Every day | ORAL | Status: DC
  Administered 2023-06-02: 1 via ORAL
  Filled 2023-06-01: qty 1

## 2023-06-01 MED ORDER — OXYCODONE HCL 5 MG PO TABS
10.0000 mg | ORAL_TABLET | ORAL | Status: DC | PRN
  Administered 2023-06-02 (×2): 10 mg via ORAL
  Filled 2023-06-01 (×2): qty 2

## 2023-06-01 MED ORDER — FOLIC ACID 1 MG PO TABS
1.0000 mg | ORAL_TABLET | Freq: Every day | ORAL | Status: DC
  Administered 2023-06-02: 1 mg via ORAL
  Filled 2023-06-01: qty 1

## 2023-06-01 MED ORDER — MOMETASONE FURO-FORMOTEROL FUM 100-5 MCG/ACT IN AERO
2.0000 | INHALATION_SPRAY | Freq: Two times a day (BID) | RESPIRATORY_TRACT | Status: DC
  Administered 2023-06-02: 2 via RESPIRATORY_TRACT
  Filled 2023-06-01: qty 8.8

## 2023-06-01 MED ORDER — DM-GUAIFENESIN ER 30-600 MG PO TB12: 1.0000 | ORAL_TABLET | Freq: Two times a day (BID) | ORAL | Status: DC | PRN

## 2023-06-01 MED ORDER — ONDANSETRON HCL 4 MG/2ML IJ SOLN: 4.0000 mg | Freq: Three times a day (TID) | INTRAMUSCULAR | Status: DC | PRN

## 2023-06-01 MED ORDER — SODIUM CHLORIDE 0.9 % IV SOLN
2.0000 g | INTRAVENOUS | Status: DC
  Administered 2023-06-01: 2 g via INTRAVENOUS
  Filled 2023-06-01: qty 20

## 2023-06-01 MED ORDER — ALBUMIN HUMAN 25 % IV SOLN
25.0000 g | Freq: Once | INTRAVENOUS | Status: AC
  Administered 2023-06-02: 25 g via INTRAVENOUS
  Filled 2023-06-01: qty 100

## 2023-06-01 MED ORDER — PRAMIPEXOLE DIHYDROCHLORIDE 0.25 MG PO TABS
0.2500 mg | ORAL_TABLET | Freq: Every evening | ORAL | Status: DC | PRN
  Administered 2023-06-02: 0.25 mg via ORAL
  Filled 2023-06-01 (×3): qty 1

## 2023-06-01 MED ORDER — HYDRALAZINE HCL 20 MG/ML IJ SOLN: 5.0000 mg | INTRAMUSCULAR | Status: DC | PRN

## 2023-06-01 MED ORDER — LACTULOSE 10 GM/15ML PO SOLN
30.0000 g | Freq: Two times a day (BID) | ORAL | Status: DC
  Administered 2023-06-01 – 2023-06-02 (×2): 30 g via ORAL
  Filled 2023-06-01 (×2): qty 60

## 2023-06-01 MED ORDER — ACETAMINOPHEN 160 MG/5ML PO SOLN: 325.0000 mg | Freq: Four times a day (QID) | ORAL | Status: DC | PRN

## 2023-06-01 MED ORDER — THIAMINE HCL 100 MG PO TABS
100.0000 mg | ORAL_TABLET | Freq: Every day | ORAL | Status: DC
  Administered 2023-06-02: 100 mg via ORAL
  Filled 2023-06-01 (×2): qty 1

## 2023-06-01 MED ORDER — ALBUTEROL SULFATE (2.5 MG/3ML) 0.083% IN NEBU: 2.5000 mg | INHALATION_SOLUTION | RESPIRATORY_TRACT | Status: DC | PRN

## 2023-06-01 MED ORDER — OXYCODONE HCL 5 MG PO TABS
15.0000 mg | ORAL_TABLET | Freq: Once | ORAL | Status: AC
  Administered 2023-06-01: 15 mg via ORAL
  Filled 2023-06-01: qty 3

## 2023-06-01 MED ORDER — PANTOPRAZOLE SODIUM 40 MG PO TBEC
40.0000 mg | DELAYED_RELEASE_TABLET | Freq: Every day | ORAL | Status: DC
  Administered 2023-06-02: 40 mg via ORAL
  Filled 2023-06-01: qty 1

## 2023-06-01 MED ORDER — FAMOTIDINE 20 MG PO TABS: 20.0000 mg | ORAL_TABLET | Freq: Every day | ORAL | Status: DC

## 2023-06-01 MED ORDER — IOHEXOL 350 MG/ML SOLN
100.0000 mL | Freq: Once | INTRAVENOUS | Status: AC | PRN
  Administered 2023-06-01: 100 mL via INTRAVENOUS

## 2023-06-01 MED ORDER — HYDROXYZINE HCL 25 MG PO TABS: 25.0000 mg | ORAL_TABLET | Freq: Three times a day (TID) | ORAL | Status: DC | PRN
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
## 2023-06-01 NOTE — ED Notes (Signed)
Bed alarm on, fall band on, bed locked and low, call light within reach, pt given warm blanket. Pt refused non skid socks at this time pt has his own shoes on. Pt sister left for the night

## 2023-06-01 NOTE — ED Notes (Signed)
Pt removed his IV in Rt forearm. Pt appeared to be confused. Pt was cleaned and placed in a gown.
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
## 2023-06-01 NOTE — ED Notes (Signed)
Pt states he has a need to urinate, bladder scan was preformed shown. Pt has a urinal at bedside and has demonstrated ability to use it.
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
## 2023-06-02 ENCOUNTER — Inpatient Hospital Stay: Payer: Medicaid Other

## 2023-06-02 DIAGNOSIS — K7460 Unspecified cirrhosis of liver: Secondary | ICD-10-CM | POA: Diagnosis not present

## 2023-06-02 DIAGNOSIS — R188 Other ascites: Secondary | ICD-10-CM | POA: Diagnosis not present

## 2023-06-02 DIAGNOSIS — N50819 Testicular pain, unspecified: Secondary | ICD-10-CM | POA: Diagnosis not present

## 2023-06-02 DIAGNOSIS — K7682 Hepatic encephalopathy: Secondary | ICD-10-CM | POA: Diagnosis not present

## 2023-06-02 LAB — CBC
HCT: 27.2 % — ABNORMAL LOW (ref 39.0–52.0)
Hemoglobin: 8.6 g/dL — ABNORMAL LOW (ref 13.0–17.0)
MCH: 26.6 pg (ref 26.0–34.0)
MCHC: 31.6 g/dL (ref 30.0–36.0)
MCV: 84.2 fL (ref 80.0–100.0)
Platelets: 229 10*3/uL (ref 150–400)
RBC: 3.23 MIL/uL — ABNORMAL LOW (ref 4.22–5.81)
RDW: 15.7 % — ABNORMAL HIGH (ref 11.5–15.5)
WBC: 5 10*3/uL (ref 4.0–10.5)
nRBC: 0 % (ref 0.0–0.2)

## 2023-06-02 LAB — AMMONIA: Ammonia: 68 umol/L — ABNORMAL HIGH (ref 9–35)

## 2023-06-02 LAB — COMPREHENSIVE METABOLIC PANEL
ALT: 23 U/L (ref 0–44)
AST: 40 U/L (ref 15–41)
Albumin: 2.5 g/dL — ABNORMAL LOW (ref 3.5–5.0)
Alkaline Phosphatase: 73 U/L (ref 38–126)
Anion gap: 8 (ref 5–15)
BUN: 33 mg/dL — ABNORMAL HIGH (ref 6–20)
CO2: 21 mmol/L — ABNORMAL LOW (ref 22–32)
Calcium: 9.5 mg/dL (ref 8.9–10.3)
Chloride: 103 mmol/L (ref 98–111)
Creatinine, Ser: 1.16 mg/dL (ref 0.61–1.24)
GFR, Estimated: >60 mL/min (ref >60)
Glucose, Bld: 88 mg/dL (ref 70–99)
Potassium: 3.4 mmol/L — ABNORMAL LOW (ref 3.5–5.1)
Sodium: 132 mmol/L — ABNORMAL LOW (ref 135–145)
Total Bilirubin: 1.5 mg/dL — ABNORMAL HIGH (ref <1.2)
Total Protein: 6.7 g/dL (ref 6.5–8.1)

## 2023-06-02 MED ORDER — CHOLESTYRAMINE 4 G PO PACK
4.0000 g | PACK | Freq: Two times a day (BID) | ORAL | Status: DC
  Filled 2023-06-02: qty 1

## 2023-06-02 MED ORDER — CHOLESTYRAMINE 4 G PO PACK: 4.0000 g | PACK | Freq: Two times a day (BID) | ORAL | 12 refills | Status: AC

## 2023-06-02 MED ORDER — FAMOTIDINE 20 MG PO TABS: 20.0000 mg | ORAL_TABLET | Freq: Every day | ORAL | 3 refills | Status: DC

## 2023-06-02 MED ORDER — HALOPERIDOL LACTATE 5 MG/ML IJ SOLN
1.0000 mg | Freq: Once | INTRAMUSCULAR | Status: AC
  Administered 2023-06-02: 1 mg via INTRAVENOUS
  Filled 2023-06-02: qty 1

## 2023-06-02 MED ORDER — OXYCODONE HCL 5 MG PO TABS
15.0000 mg | ORAL_TABLET | Freq: Once | ORAL | Status: AC
  Administered 2023-06-02: 15 mg via ORAL
  Filled 2023-06-02: qty 3

## 2023-06-02 MED ORDER — HYDROXYZINE PAMOATE 25 MG PO CAPS: 25.0000 mg | ORAL_CAPSULE | Freq: Three times a day (TID) | ORAL | 0 refills | Status: DC | PRN

## 2023-06-02 MED ORDER — ONDANSETRON 4 MG PO TBDP: 4.0000 mg | ORAL_TABLET | Freq: Three times a day (TID) | ORAL | 0 refills | Status: AC | PRN

## 2023-06-02 MED ORDER — LACTULOSE 10 GM/15ML PO SOLN: ORAL | 1 refills | Status: AC

## 2023-06-02 NOTE — ED Notes (Addendum)
Pt sister has concerns wanting to be passed along to doctor. This RN messaged Dr. Lyn Hollingshead. "Hi ED4 sister states that pt has made statements of ' not wanting to be here' and wanted you to know that, and is very concerned about the itching. She said you can call her. I am not his primary nurse just passing along the message." Liborio Nixon is sister in the chart.

## 2023-06-02 NOTE — ED Notes (Addendum)
This RN at bedside, per primary RN request. Pt has been verbally disrespectful and inappropriate to primary RN, see previous note. This RN at bedside and explained that we should not be with speaking to staff in this manner. Pt only request at this time was pain medication, also instructed pt to keep arm straight so that medication can be successfully administered. Pt is keeping arm straight at this time.

## 2023-06-02 NOTE — ED Notes (Signed)
Per DO pt is up for DC.

## 2023-06-02 NOTE — Procedures (Signed)
Patient presented today for possible therapeutic paracentesis. Limited ultrasound examination of the abdomen revealed small volume ascites. Fluid volume insufficient to safely perform procedure. No procedure performed. Images saved in Epic.   [address] [place], [place] [phone]  06/02/2023, 12:07

## 2023-06-02 NOTE — ED Notes (Signed)
Pt given his belongings to get dressed. Pt then state s"am I going to get my pain meds before I leave?" Pt has one med ordered. This RN will obtain and admin after he is safely dressed.

## 2023-06-02 NOTE — ED Notes (Signed)
Pt making inappropriate requests such as "scratch my balls" multiple times this night. Pt disrespectful by demanding I complete at task for him when he asks for it regardless of my current duties. While hanging Albumin pt had repeated gotten angry with me for not finding his TV remote he lost in his bed, pt did not accept that I needed to tend to medications before the TV remote. Pt has repeated asked to speak to charge nurse to complain about his treatment, charge nurse has been retrieved for him. Pt refuses to keep blankets or a gown on.

## 2023-06-02 NOTE — Discharge Summary (Signed)
Physician Discharge Summary   Patient: Tom [name] MRN: [id number]  DOB: 1966-01-05   Admit:     Date of Admission: 06/01/2023 Admitted from: home   Discharge: Date of discharge: 06/02/23 Disposition: Home Condition at discharge: good  CODE STATUS: FULL CODE     Discharge Physician: Sunnie Nielsen, DO [facility]     PCP: [name], DO  Recommendations for Outpatient Follow-up:  Follow up with PCP [name], DO in 1 weeks Please obtain labs/tests: CBC, CMP, Ammonia in 1 week    Discharge Instructions     Discharge patient   Complete by: As directed    Discharge disposition: 01-Home or Self Care   Discharge patient date: 06/02/2023         Discharge Diagnoses: Principal Problem:   Hepatic encephalopathy (HCC) Active Problems:   Alcoholic cirrhosis of liver with ascites (HCC)   Essential hypertension   COPD with asthma (HCC)   GERD without esophagitis   Chronic kidney disease, stage 3a (HCC)   Testicle pain   RLS (restless legs syndrome)   Normocytic anemia   Obesity (BMI 30-39.9)       HPI:  Tom [name] is a 57 y.o. 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 Admitting hospitalist spoke w/ sister who reports confusion x few weeks worsening. Per sister, patient has 2 loose BM daily. Compliant w/ lactulose.  Patient c/o testicle pain, but cannot characterize this pain or lower abdominal pain. He has chronic leg and back pain. Of note, pt was scheduled for paracenteses on 05/29/23, but he did not have sufficient fluid to safely perform procedure by IR.   Hospital course / significant events:  12/12: admitted to hospitalist service for hepatic encephalopathy. Started on empiric ceftriaxone. Pending paracentesis / scrotum US   12/13: no concerns on scrotal US, no significant ascites to warrant paracentesis.Ammonia trending down, mental status improved, he and wife feel comfortable for discharge home   Consultants:  none  Procedures/Surgeries: none      ASSESSMENT & PLAN:    Alcoholic cirrhosis of liver with ascites Hepatic encephalopathy improved/resolved - evidenced by altered mental status, elevated ammonia level 75.  CT head negative.    lactulose 30 g bid --> 20 g bid, follow ammonia outpatient Continue lasix, spiro  Urticaria Continue hydroxyzine at home Added cholestyramine suspect cholestasis    Behavioral concerns likely result of encephalopathy  Seems to have resolved monitoring  Essential hypertension Follow outpatient    COPD with asthma: Stable Bronchodilators and prn Mucinex   GERD without esophagitis Pepcid and Protonix   Chronic kidney disease, stage 3a: Stable.   Baseline creatinine 1.3-1.6 recently.  His creatinine is 1.27, BUN 40, GFR> 60 monitor BMP   Testicle pain:  Etiology is not clear.  Urinalysis negative. Follow-up testicular ultrasound - no concerns and symptoms improved    RLS (restless legs syndrome) Pramipexole   Normocytic anemia:  Hemoglobin stable 9.9 (9.7 on 03/15/2023) monitor CBC   Chronic pain complication of orthopedic hardware Reviewed recent PCP notes: Oxycodone 15mg , 2-4 times daily, caregiver is managing medication administration. Routinely prescribed #60 for 2 weeks plan resume home regimen on discharge   Obesity (BMI 30-39.9): Body weight 103.1 kg, BMI 35.72 If/when mental status is improved, will need counseling for encouraging losing weight, to exercise and take healthy  diet       Discharge Instructions  Allergies as of 06/02/2023       Reactions   Alfuzosin Other (See Comments)   Dizziness    Amlodipine Nausea And Vomiting   Bee Venom Swelling   Cymbalta [duloxetine Hcl] Other (See Comments)   Emesis   Duloxetine Other  (See Comments)   Emesis   Lisinopril    Flomax [tamsulosin Hcl] Itching        Medication List     TAKE these medications    albuterol (2.5 MG/3ML) 0.083% nebulizer solution Commonly known as: PROVENTIL Take 3 mLs (2.5 mg total) by nebulization every 4 (four) hours as needed for wheezing or shortness of breath.   albuterol 108 (90 Base) MCG/ACT inhaler Commonly known as: Ventolin HFA Inhale 2 puffs into the lungs every 4 (four) hours as needed.   budesonide-formoterol 80-4.5 MCG/ACT inhaler Commonly known as: Symbicort TAKE 2 PUFFS FIRST THING IN AM AND THEN ANOTHER 2 PUFFS ABOUT 12 HOURS LATER.   cholestyramine 4 g packet Commonly known as: QUESTRAN Take 1 packet (4 g total) by mouth 2 (two) times daily.   EPINEPHrine 0.3 mg/0.3 mL Soaj injection Commonly known as: EpiPen 2-Pak Inject 0.3 mg into the muscle as needed for anaphylaxis.   famotidine 20 MG tablet Commonly known as: Pepcid Take 1 tablet (20 mg total) by mouth daily after supper. One after supper   folic acid 1 MG tablet Commonly known as: FOLVITE Take 1 mg by mouth daily.   furosemide 40 MG tablet Commonly known as: LASIX Take 80 mg by mouth daily.   hydrOXYzine 25 MG capsule Commonly known as: VISTARIL Take 1 capsule (25 mg total) by mouth every 8 (eight) hours as needed for itching or anxiety. What changed: reasons to take this   lactulose 10 GM/15ML solution Commonly known as: CHRONULAC Take 45 mLs (30 g total) by mouth 2 (two) times daily for 2 days, THEN 30 mLs (20 g total) 2 (two) times daily. Start taking on: June 02, 2023 What changed: See the new instructions.   LIVER SUPPORT SL Place under the tongue.   magnesium oxide 400 (240 Mg) MG tablet Commonly known as: MAG-OX Take 2 tablets by mouth 2 (two) times daily.   multivitamin capsule Take 1 capsule by mouth daily.   ondansetron 4 MG disintegrating tablet Commonly known as: ZOFRAN-ODT Take 1 tablet (4 mg total) by mouth  every 8 (eight) hours as needed for nausea or vomiting.   oxyCODONE 15 MG immediate release tablet Commonly known as: ROXICODONE Take 1 tablet (15 mg total) by mouth every 4 (four) hours as needed for pain.   pantoprazole 40 MG tablet Commonly known as: Protonix Take 1 tablet (40 mg total) by mouth daily before breakfast.   pramipexole 0.5 MG tablet Commonly known as: Mirapex Start taking HALF tablet for dose 0.25mg  2 hour before bed. If not effective can take 1 whole tablet for dose 0.5mg  nightly if needed for RLS.   rifaximin 200 MG tablet Commonly known as: XIFAXAN Take 200 mg by mouth 2 (two) times daily.   spironolactone 50 MG tablet Commonly known as: ALDACTONE Take 150 mg by mouth daily.   thiamine 100 MG tablet Commonly known as: VITAMIN B1 Take 1 tablet (100 mg total) by mouth daily.         Follow-up Information     [name], DO Follow up.   Specialty: Family Medicine Why: repeat labs in 1 week -  recheck ammonia Contact information: [address] [place] [address] [phone]                 Allergies  Allergen Reactions   Alfuzosin Other (See Comments)    Dizziness    Amlodipine Nausea And Vomiting   Bee Venom Swelling   Cymbalta [Duloxetine Hcl] Other (See Comments)    Emesis    Duloxetine Other (See Comments)    Emesis   Lisinopril    Flomax [Tamsulosin Hcl] Itching     Subjective: pt reports itching, recalls being agitated overnight and had some complaints about the RN caring for him overnight. Reports itching, widespread, worse than usual but not much, this is a chronic problem for him. Denies CP/SOB, denies abdominal pain, denies fever/chils/ Per wife he is about at his baseline    Discharge Exam: BP 121/60   Pulse 81   Temp 98 F (36.7 C) (Oral)   Resp 16   Ht 5\' 11"  (1.803 m)   Wt 103.1 kg   SpO2 100%   BMI 31.72 kg/m  General: Pt is alert, awake, not in acute distress Cardiovascular: RRR, S1/S2 +, no  rubs, no gallops Respiratory: CTA bilaterally, no wheezing, no rhonchi Abdominal: Soft, NT, ND, bowel sounds + Extremities: no edema, no cyanosis     The results of significant diagnostics from this hospitalization (including imaging, microbiology, ancillary and laboratory) are listed below for reference.     Microbiology: No results found for this or any previous visit (from the past 240 hours).   Labs: BNP (last 3 results) Recent Labs    12/15/22 1912 01/09/23 1049  BNP 137.5* 202.1*   Basic Metabolic Panel: Recent Labs  Lab 06/01/23 1145 06/02/23 0416  NA 135 132*  K 3.6 3.4*  CL 105 103  CO2 24 21*  GLUCOSE 129* 88  BUN 40* 33*  CREATININE 1.27* 1.16  CALCIUM 10.1 9.5   Liver Function Tests: Recent Labs  Lab 06/01/23 1145 06/02/23 0416  AST 47* 40  ALT 26 23  ALKPHOS 90 73  BILITOT 1.7* 1.5*  PROT 7.8 6.7  ALBUMIN 3.0* 2.5*   No results for input(s): "LIPASE", "AMYLASE" in the last 168 hours. Recent Labs  Lab 06/01/23 1145 06/02/23 0416  AMMONIA 75* 68*   CBC: Recent Labs  Lab 06/01/23 1145 06/02/23 0416  WBC 5.8 5.0  NEUTROABS 2.9  --   HGB 9.9* 8.6*  HCT 31.3* 27.2*  MCV 85.8 84.2  PLT 274 229   Cardiac Enzymes: No results for input(s): "CKTOTAL", "CKMB", "CKMBINDEX", "TROPONINI" in the last 168 hours. BNP: Invalid input(s): "POCBNP" CBG: No results for input(s): "GLUCAP" in the last 168 hours. D-Dimer No results for input(s): "DDIMER" in the last 72 hours. Hgb A1c No results for input(s): "HGBA1C" in the last 72 hours. Lipid Profile No results for input(s): "CHOL", "HDL", "LDLCALC", "TRIG", "CHOLHDL", "LDLDIRECT" in the last 72 hours. Thyroid function studies No results for input(s): "TSH", "T4TOTAL", "T3FREE", "THYROIDAB" in the last 72 hours.  Invalid input(s): "FREET3" Anemia work up No results for input(s): "VITAMINB12", "FOLATE", "FERRITIN", "TIBC", "IRON", "RETICCTPCT" in the last 72 hours. Urinalysis    Component  Value Date/Time   COLORURINE YELLOW (A) 06/01/2023 2016   APPEARANCEUR CLEAR (A) 06/01/2023 2016   LABSPEC 1.026 06/01/2023 2016   PHURINE 7.0 06/01/2023 2016   GLUCOSEU NEGATIVE 06/01/2023 2016   HGBUR SMALL (A) 06/01/2023 2016   BILIRUBINUR NEGATIVE 06/01/2023 2016   BILIRUBINUR Negative 08/24/2021 1516  KETONESUR NEGATIVE 06/01/2023 2016   PROTEINUR NEGATIVE 06/01/2023 2016   UROBILINOGEN 0.2 08/24/2021 1516   NITRITE NEGATIVE 06/01/2023 2016   LEUKOCYTESUR NEGATIVE 06/01/2023 2016   Sepsis Labs Recent Labs  Lab 06/01/23 1145 06/02/23 0416  WBC 5.8 5.0   Microbiology No results found for this or any previous visit (from the past 240 hours). Imaging [place] ASCITES (ABDOMEN LIMITED) Result Date: 06/02/2023 CLINICAL DATA:  57 year old male with history of cirrhosis, recurrent ascites. Patient presents for therapeutic paracentesis. EXAM: LIMITED ABDOMEN ULTRASOUND FOR ASCITES TECHNIQUE: Limited ultrasound survey for ascites was performed in all four abdominal quadrants. COMPARISON:  CT AP with contrast 06/01/2023, U/S abdomen limited 05/29/2023 FINDINGS: Small amount of ascites seen on ultrasound. No safe percutaneous window. Procedure not performed. IMPRESSION: Small volume ascites. Procedure not performed. Ultrasound images reviewed by Buzzy Han PA and Dr. Fredia Sorrow. Electronically Signed   By: Irish Lack M.D.   On: 06/02/2023 12:13   US SCROTUM W/DOPPLER Result Date: 06/02/2023 CLINICAL DATA:  57 year old male with testicular pain for 1 day. EXAM: SCROTAL ULTRASOUND DOPPLER ULTRASOUND OF THE TESTICLES TECHNIQUE: Complete ultrasound examination of the testicles, epididymis, and other scrotal structures was performed. Color and spectral Doppler ultrasound were also utilized to evaluate blood flow to the testicles. COMPARISON:  CT Abdomen and Pelvis yesterday. FINDINGS: Right testicle Measurements: 4.1 x 2.0 x 2.2 cm. Heterogeneous right testicular echotexture (especially on series  1, image 14), but no discrete testicular mass or fluid. Left testicle Measurements: 4.0 x 1.8 x 2.1 cm. Similar heterogeneous left testicle, coarse echotexture on image 51. No discrete left testicular mass or fluid. Right epididymis: Normal in size and appearance. No hypervascularity. Left epididymis: Normal in size and appearance. No hypervascularity. Hydrocele:  None visualized. Varicocele:  None visualized. Pulsed Doppler interrogation of both testes demonstrates normal low resistance arterial and venous waveforms bilaterally. IMPRESSION: Nonspecific bilateral testicular heterogeneity, but negative for discrete testicular mass or torsion. Electronically Signed   By: Odessa Fleming M.D.   On: 06/02/2023 10:23   CT ABDOMEN PELVIS W CONTRAST Result Date: 06/01/2023 CLINICAL DATA:  Abdominal pain.  History of cirrhosis. EXAM: CT ABDOMEN AND PELVIS WITH CONTRAST TECHNIQUE: Multidetector CT imaging of the abdomen and pelvis was performed using the standard protocol following bolus administration of intravenous contrast. RADIATION DOSE REDUCTION: This exam was performed according to the departmental dose-optimization program which includes automated exposure control, adjustment of the mA and/or kV according to patient size and/or use of iterative reconstruction technique. CONTRAST:  OMNIPAQUE IOHEXOL 350 MG/ML SOLN COMPARISON:  No recent imaging studies other than limited ultrasound examinations 4 ascites. FINDINGS: Lower chest: The lung bases are clear of an acute process. No infiltrates or effusions. There are multiple bilateral calcified granulomas noted. The heart is normal in size. No pericardial effusion. The distal esophagus is grossly normal. Small paraesophageal varices. Hepatobiliary: Cirrhotic changes involving the liver but no worrisome hepatic lesions or intrahepatic biliary dilatation. The gallbladder is unremarkable. No common bile duct dilatation. Pancreas: Moderate pancreatic atrophy but no mass,  inflammation or ductal dilatation. Spleen: Normal size.  Perisplenic collaterals are noted. Adrenals/Urinary Tract: The adrenal glands and kidneys are unremarkable. No renal lesions or hydronephrosis. Bladder is unremarkable. Stomach/Bowel: The stomach, duodenum, small bowel and colon are grossly normal. No acute inflammatory process or obstructive findings. Diffuse colonic diverticulosis without findings for acute diverticulitis. Vascular/Lymphatic: The aorta and branch vessels are patent. Scattered age advanced atherosclerotic calcifications. No aneurysm or dissection. The major venous structures are patent.  The splenic and portal veins are patent but diminutive. There are massive portal venous collaterals. Scattered upper abdominal lymph nodes typical with cirrhosis. No mass or overt adenopathy. Reproductive: The prostate gland and seminal vesicles are unremarkable. Other: Moderate volume abdominal/pelvic ascites. Mild diffuse body wall edema. Musculoskeletal: No significant bony findings. IMPRESSION: 1. Cirrhotic changes involving the liver but no worrisome hepatic lesions or intrahepatic biliary dilatation. 2. Moderate volume abdominal/pelvic ascites. 3. Massive portal venous collaterals. 4. Diffuse colonic diverticulosis without findings for acute diverticulitis. 5. Age advanced atherosclerotic calcifications involving the aorta and branch vessels. Aortic Atherosclerosis (ICD10-I70.0). Electronically Signed   By: Rudie Meyer M.D.   On: 06/01/2023 19:49   CT Head Wo Contrast Result Date: 06/01/2023 CLINICAL DATA:  Mental status change, unknown cause. EXAM: CT HEAD WITHOUT CONTRAST TECHNIQUE: Contiguous axial images were obtained from the base of the skull through the vertex without intravenous contrast. RADIATION DOSE REDUCTION: This exam was performed according to the departmental dose-optimization program which includes automated exposure control, adjustment of the mA and/or kV according to patient size  and/or use of iterative reconstruction technique. COMPARISON:  None Available. FINDINGS: Brain: No evidence of acute infarction, hemorrhage, hydrocephalus, extra-axial collection or mass effect. Small focus of nodular calcification at the right caudothalamic groove measuring 4 mm, likely from old insult or calcified cavernoma. Reportedly, this is the finding dating back to 2016 (see head CT report in care everywhere 08/23/2022). Vascular: No hyperdense vessel or unexpected calcification. Skull: Normal. Negative for fracture or focal lesion. Sinuses/Orbits: No acute finding. IMPRESSION: No acute finding or explanation for symptoms. Electronically Signed   By: Tiburcio Pea M.D.   On: 06/01/2023 19:43      Time coordinating discharge: over 30 minutes  SIGNED:  Sunnie Nielsen DO [facility]

## 2023-06-02 NOTE — ED Notes (Signed)
This RN to bedside to introduce self and answer call bell. Pt is very grumpy. States he needs to use the restroom, however he just went to the bathroom per the tech. He states "I pooped then and now I need to pee". This RN handed him a urinal and advised lets use that until I have had time to review his chart and if he is allowed to stand. Pt then states "Yall people are crazy".

## 2023-06-02 NOTE — Hospital Course (Addendum)
HPI:  Tom [name] is a 57 y.o. 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 Admitting hospitalist spoke w/ sister who reports confusion x few weeks worsening. Per sister, patient has 2 loose BM daily. Compliant w/ lactulose.  Patient c/o testicle pain, but cannot characterize this pain or lower abdominal pain. He has chronic leg and back pain. Of note, pt was scheduled for paracenteses on 05/29/23, but he did not have sufficient fluid to safely perform procedure by IR.   Hospital course / significant events:  12/12: admitted to hospitalist service for hepatic encephalopathy. Started on empiric ceftriaxone. Pending paracentesis / scrotum US  12/13: no concerns on scrotal US, no significant ascites to warrant paracentesis. PT/OT eval pending. Ammonia trending down, still altered/behavioral pwiblems   Consultants:  ***  Procedures/Surgeries: ***      ASSESSMENT & PLAN:    Alcoholic cirrhosis of liver with ascites Hepatic encephalopathy evidenced by altered mental status, elevated ammonia level 75.  CT head negative.    lactulose 30 g bid frequent neuro check Follow Ammonia levels Fall precaution Rocephin IV empirically paracentesis and fluid eval 25 g of albumin was ordered but he was refusing this    Behavioral concerns question baseline vs result of encephalopathy  Refusing medications (albumin), inappropriate verbiage to nursing staff, inappropriate requests of RN re: assistance addressing reported scrotal urticaria, assistance with urination when he is capable of using urinal Education and monitoring Prn for severe agitation if patient becomes danger to staff/himself   Essential hypertension IV hydralazine as needed   COPD with asthma: Stable Bronchodilators and prn Mucinex   GERD without  esophagitis Pepcid and Protonix   Chronic kidney disease, stage 3a: Stable.   Baseline creatinine 1.3-1.6 recently.  His creatinine is 1.27, BUN 40, GFR> 60 monitor BMP   Testicle pain:  Etiology is not clear.  Urinalysis negative. Follow-up testicular ultrasound   RLS (restless legs syndrome) Pramipexole   Normocytic anemia:  Hemoglobin stable 9.9 (9.7 on 03/15/2023) monitor CBC   Chronic pain complication of orthopedic hardware Reviewed recent PCP notes: Oxycodone 15mg , 2-4 times daily, caregiver is managing medication administration. Routinely prescribed #60 for 2 weeks Oxycodone 10 mg q4h prn here plan resume home regimen on discharge Oxycodone 15mg , 2-4 times daily - last filled 11/23 per PDMP, ok for sohrt-term refill and will have to follow up w/ PCP Dr Althea Charon for further Rx   Obesity (BMI 30-39.9): Body weight 103.1 kg, BMI 35.72 If/when mental status is improved, will need counseling for encouraging losing weight, to exercise and take healthy diet       DVT prophylaxis: *** IV fluids: no continuous IV fluids  Nutrition: *** Central lines / invasive devices: ***  Code Status: FULL CODE ACP documentation reviewed:  none on file in [facility]  TOC needs: TBD Barriers to dispo / significant pending items: pending paracentesis and fluid analysis

## 2023-06-05 ENCOUNTER — Ambulatory Visit
Admission: RE | Admit: 2023-06-05 | Discharge: 2023-06-05 | Disposition: A | Discharge status: Home / Self Care | Payer: Medicaid Other | Source: Ambulatory Visit | Attending: Transplant Hepatology | Admitting: Transplant Hepatology

## 2023-06-05 ENCOUNTER — Other Ambulatory Visit: Payer: Self-pay | Admitting: Transplant Hepatology

## 2023-06-05 ENCOUNTER — Other Ambulatory Visit: Payer: Self-pay | Admitting: Family Medicine

## 2023-06-05 DIAGNOSIS — K7460 Unspecified cirrhosis of liver: Secondary | ICD-10-CM

## 2023-06-05 DIAGNOSIS — R188 Other ascites: Secondary | ICD-10-CM | POA: Diagnosis not present

## 2023-06-05 DIAGNOSIS — Z0279 Encounter for issue of other medical certificate: Secondary | ICD-10-CM

## 2023-06-16 ENCOUNTER — Ambulatory Visit: Payer: Self-pay | Admitting: *Deleted

## 2023-06-23 ENCOUNTER — Other Ambulatory Visit: Payer: Self-pay | Admitting: *Deleted

## 2023-06-23 NOTE — Patient Outreach (Signed)
 Medicaid Managed Care   Nurse Care Manager Note  06/23/2023 Name:  Tom [name] MRN:  [id number] DOB:  12-29-1965  Tom [name] [place] is an 58 y.o. year old male who is a primary patient of Edman Marsa PARAS, DO.  The [facility] Managed Care Coordination team was consulted for assistance with:    Liver Cirrhosis  Mr. Dunavant was given information about Medicaid Managed Care Coordination team services today. Tom [name] [name] agreed to services and verbal consent obtained.  Engaged with patient by telephone for follow up visit in response to provider referral for case management and/or care coordination services.   Patient is participating in a Managed Medicaid Plan:  Yes  Assessments/Interventions:  Review of past medical history, allergies, medications, health status, including review of consultants reports, laboratory and other test data, was performed as part of comprehensive evaluation and provision of chronic care management services.  SDOH (Social Drivers of Health) assessments and interventions performed: SDOH Interventions    Flowsheet Row Patient Outreach Telephone from 06/23/2023 in [place] POPULATION HEALTH DEPARTMENT Patient Outreach Telephone from 05/12/2023 in [place] POPULATION HEALTH DEPARTMENT Telephone from 01/11/2023 in [place] POPULATION HEALTH DEPARTMENT ED to Hosp-Admission (Discharged) from 12/26/2022 in [facility] ED to Hosp-Admission (Discharged) from 12/15/2022 in [facility] Patient Outreach Telephone from 10/19/2022 in [place] POPULATION HEALTH DEPARTMENT  SDOH Interventions        Food Insecurity Interventions Intervention Not Indicated Intervention Not Indicated -- -- -- --  Housing Interventions Intervention Not Indicated Intervention Not Indicated -- -- -- --  Transportation Interventions Intervention Not Indicated Intervention Not Indicated  Intervention Not Indicated Inpatient TOC, Other (Comment) Inpatient TOC --  Utilities Interventions -- Intervention Not Indicated -- -- -- Intervention Not Indicated       Care Plan  Allergies  Allergen Reactions   Alfuzosin  Other (See Comments)    Dizziness    Amlodipine Nausea And Vomiting   Bee Venom Swelling   Cymbalta [Duloxetine Hcl] Other (See Comments)    Emesis    Duloxetine Other (See Comments)    Emesis   Lisinopril    Flomax  [Tamsulosin  Hcl] Itching    Medications Reviewed Today     Reviewed by Lucky Andrea LABOR, RN (Registered Nurse) on 06/23/23 at 1331  Med List Status: <None>   Medication Order Taking? Sig Documenting Provider Last Dose Status Informant  albuterol  (PROVENTIL ) (2.5 MG/3ML) 0.083% nebulizer solution [id number] Yes Take 3 mLs (2.5 mg total) by nebulization every 4 (four) hours as needed for wheezing or shortness of breath. [name], MD Taking Active Spouse/Significant Other  albuterol  (VENTOLIN  HFA) 108 (90 Base) MCG/ACT inhaler [id number] Yes Inhale 2 puffs into the lungs every 4 (four) hours as needed. Edman Marsa PARAS, DO Taking Active Spouse/Significant Other  budesonide -formoterol  (SYMBICORT ) 80-4.5 MCG/ACT inhaler [id number] Yes TAKE 2 PUFFS FIRST THING IN AM AND THEN ANOTHER 2 PUFFS ABOUT 12 HOURS LATER. Edman Marsa PARAS, DO Taking Active Spouse/Significant Other  cholestyramine  (QUESTRAN ) 4 g packet [id number] Yes Take 1 packet (4 g total) by mouth 2 (two) times daily. [name], [name], DO Taking Active   EPINEPHrine  (EPIPEN  2-PAK) 0.3 mg/0.3 mL IJ SOAJ injection [id number] Yes Inject 0.3 mg into the muscle as needed for anaphylaxis. Edman Marsa PARAS, DO Taking Active Spouse/Significant Other  famotidine  (PEPCID ) 20 MG tablet [id number] Yes Take 1 tablet (20 mg total) by mouth daily after supper. One after supper [name], [name], DO Taking Active  folic acid  (FOLVITE ) 1 MG tablet [id number] Yes Take 1 mg by mouth  daily. [provider] Taking Active Spouse/Significant Other  furosemide  (LASIX ) 40 MG tablet [id number] Yes Take 80 mg by mouth daily. [provider] Taking Active Spouse/Significant Other  Homeopathic Products (LIVER SUPPORT SL) [id number] No Place under the tongue. [provider] Unknown Active Spouse/Significant Other  hydrOXYzine  (VISTARIL ) 25 MG capsule [id number] Yes Take 1 capsule (25 mg total) by mouth every 8 (eight) hours as needed for itching or anxiety. [name], DO Taking Active   lactulose  (CHRONULAC ) 10 GM/15ML solution [id number] Yes Take 45 mLs (30 g total) by mouth 2 (two) times daily for 2 days, THEN 30 mLs (20 g total) 2 (two) times daily. [name], [name], DO Taking Active   magnesium  oxide (MAG-OX) 400 (240 Mg) MG tablet [id number] No Take 2 tablets by mouth 2 (two) times daily. [provider] Unknown Active Spouse/Significant Other  Multiple Vitamin (MULTIVITAMIN) capsule [id number] Yes Take 1 capsule by mouth daily. [provider] Taking Active Spouse/Significant Other  ondansetron  (ZOFRAN -ODT) 4 MG disintegrating tablet [id number] Yes Take 1 tablet (4 mg total) by mouth every 8 (eight) hours as needed for nausea or vomiting. [name], DO Taking Active   oxyCODONE  (ROXICODONE ) 15 MG immediate release tablet [id number] Yes Take 1 tablet (15 mg total) by mouth every 4 (four) hours as needed for pain. Edman Marsa PARAS, DO Taking Active Spouse/Significant Other  pantoprazole  (PROTONIX ) 40 MG tablet [id number] No Take 1 tablet (40 mg total) by mouth daily before breakfast. Edman Marsa PARAS, DO Unknown Active Spouse/Significant Other  pramipexole  (MIRAPEX ) 0.5 MG tablet [id number] No Start taking HALF tablet for dose 0.25mg  2 hour before bed. If not effective can take 1 whole tablet for dose 0.5mg  nightly if needed for RLS.  Patient not taking: Reported on 06/02/2023   Edman Marsa PARAS, DO Not Taking  Active Spouse/Significant Other  rifaximin  (XIFAXAN ) 200 MG tablet [id number] Yes Take 200 mg by mouth 2 (two) times daily. [provider] Taking Active Spouse/Significant Other  spironolactone  (ALDACTONE ) 50 MG tablet [id number] Yes Take 150 mg by mouth daily. [provider] Taking Active Spouse/Significant Other  thiamine  (VITAMIN B1) 100 MG tablet [id number] Yes Take 1 tablet (100 mg total) by mouth daily. Edman Marsa PARAS, DO Taking Active Spouse/Significant Other            Patient Active Problem List   Diagnosis Date Noted   Hepatic encephalopathy (HCC) 06/01/2023   Normocytic anemia 06/01/2023   RLS (restless legs syndrome) 06/01/2023   Testicle pain 06/01/2023   Abdominal pain 01/16/2023   Anxiety 01/16/2023   Chronic kidney disease, stage 3a (HCC) 01/16/2023   Cellulitis of right lower extremity 12/31/2022   Obesity (BMI 30-39.9) 12/27/2022   Cellulitis of right foot 12/26/2022   Hyponatremia 12/17/2022   End stage liver disease (HCC) 12/15/2022   Dyslipidemia 12/04/2022   COPD with asthma (HCC) 12/04/2022   GERD without esophagitis 12/04/2022   Abdominal distension 12/04/2022   Shortness of breath 12/04/2022   Ascites 12/03/2022   Right foot pain 11/15/2022   AKI (acute kidney injury) (HCC) 11/15/2022   Elevated lactic acid level 11/15/2022   Complication associated with orthopedic device (HCC) 10/21/2022   Alcoholic cirrhosis of liver with ascites (HCC) 07/04/2022   Former smoker 09/16/2021   Asthmatic bronchitis , chronic (HCC) 09/16/2021   Pre-diabetes 08/13/2021   Morbid obesity due to excess calories (HCC) 08/13/2021   DOE (dyspnea on exertion) 04/13/2021  Acute pain of left wrist 03/25/2021   DDD (degenerative disc disease), lumbar 12/16/2020   Chronic back pain 12/16/2020   Arthritis of right ankle 05/08/2017   Atherosclerosis of native coronary artery of native heart without angina pectoris 01/06/2015   Essential hypertension  10/03/2012   Osteoarthritis 12/06/2011    Conditions to be addressed/monitored per PCP order:   Liver Cirrhosis  Care Plan : RN Care Manager Plan of Care  Updates made by Lucky Andrea LABOR, RN since 06/23/2023 12:00 AM     Problem: Health Managment needs related to Liver Cirrhosis      Long-Range Goal: Development of Plan of Care to address Health Managment needs related to Liver Cirrhosis   Start Date: 02/01/2023  Expected End Date: 08/18/2023  Note:   Current Barriers:  Knowledge Deficits related to plan of care for management of Liver Cirrohsis Mr. Swindler continues to have right ankle pain. Taking pain medication every 4 hours. No longer having paracentesis due to decreased fluid. His sister and girlfriend assist with medication management.   RNCM Clinical Goal(s):  Patient will verbalize understanding of plan for management of Liver Cirrhosis as evidenced by patient reports attend all scheduled medical appointments: 06/27/23 with PCP as evidenced by provider documentation in EMR        continue to work with RN Care Manager and/or Social Worker to address care management and care coordination needs related to Liver Cirrhosis as evidenced by adherence to CM Team Scheduled appointments     through collaboration with RN Care manager, provider, and care team.   Interventions: Evaluation of current treatment plan related to  self management and patient's adherence to plan as established by provider   Liver Cirrhosis  (Status: Goal on Track (progressing): YES.) Long Term Goal  Evaluation of current treatment plan related to  Liver Cirrhosis ,  self-management and patient's adherence to plan as established by provider. Discussed plans with patient for ongoing care management follow up and provided patient with direct contact information for care management team Advised patient to keep a calendar of visits, providers and contact information; Reviewed medications with patient and discussed  the importance of taking lactulose  and having bowel movement every day; Assessed social determinant of health barriers;  Advised patient to discuss any concerns or questions regarding liver transplant with Hepatology-discussed sending MyChart message is the best way to communicate  Discussed the involvement of Palliative Care, patient felt services not needed at this time Provided therapeutic listening Discussed authorization is needed with [facility] and [facility] to access health information, advised patient to discuss at next visit   Patient Goals/Self-Care Activities: Take medications as prescribed   Attend all scheduled provider appointments Call provider office for new concerns or questions        Follow Up:  Patient agrees to Care Plan and Follow-up.  Plan: The Managed Medicaid care management team will reach out to the patient again over the next 30 days.  Date/time of next scheduled RN care management/care coordination outreach:  07/25/23 at 10:30am  Andrea Lucky RN, BSN [place]  Value-Based Care [facility] RN Care Coordinator [phone]

## 2023-06-23 NOTE — Patient Instructions (Signed)
 Visit Information  [name] was given information about Medicaid Managed Care team care coordination services as a part of their [employer] Medicaid benefit. Tom [name] verbally consented to engagement with the [facility] Managed Care team.   If you are experiencing a medical emergency, please call 911 or report to your local emergency department or urgent care.   If you have a non-emergency medical problem during routine business hours, please contact your provider's office and ask to speak with a nurse.   For questions related to your Healthy [facility] health plan, please call: [phone] or visit the homepage here: [email or web address]  If you would like to schedule transportation through your Healthy [facility] plan, please call the following number at least 2 days in advance of your appointment: [phone]  For information about your ride after you set it up, call Ride Assist at [phone]. Use this number to activate a Will Call pickup, or if your transportation is late for a scheduled pickup. Use this number, too, if you need to make a change or cancel a previously scheduled reservation.  If you need transportation services right away, call [phone]. The after-hours call center is staffed 24 hours to handle ride assistance and urgent reservation requests (including discharges) 365 days a year. Urgent trips include sick visits, hospital discharge requests and life-sustaining treatment.  Call the [facility] Line at [phone], at any time, 24 hours a day, 7 days a week. If you are in danger or need immediate medical attention call 911.  If you would like help to quit smoking, call 1-800-QUIT-NOW (3465903142) OR Espaol: 1-855-Djelo-Ya (8-144-664-6430) o para ms informacin haga clic aqu or Text READY to 799-599 to register via text  Tom [name],   Please see education materials related to  Ascites provided by MyChart link.  Patient verbalizes understanding of instructions and care plan provided today and agrees to view in MyChart. Active MyChart status and patient understanding of how to access instructions and care plan via MyChart confirmed with patient.     Telephone follow up appointment with Managed Medicaid care management team member scheduled for:07/25/23 at 10:30am  Andrea Dimes RN, BSN [place]  Value-Based Care [facility] RN Care Coordinator [phone]   Following is a copy of your plan of care:  Care Plan : RN Care Manager Plan of Care  Updates made by Dimes Andrea LABOR, RN since 06/23/2023 12:00 AM     Problem: Health Managment needs related to Liver Cirrhosis      Long-Range Goal: Development of Plan of Care to address Health Managment needs related to Liver Cirrhosis   Start Date: 02/01/2023  Expected End Date: 08/18/2023  Note:   Current Barriers:  Knowledge Deficits related to plan of care for management of Liver Cirrohsis [name] continues to have right ankle pain. Taking pain medication every 4 hours. No longer having paracentesis due to decreased fluid. His sister and girlfriend assist with medication management.   RNCM Clinical Goal(s):  Patient will verbalize understanding of plan for management of Liver Cirrhosis as evidenced by patient reports attend all scheduled medical appointments: 06/27/23 with PCP as evidenced by provider documentation in EMR        continue to work with RN Care Manager and/or Social Worker to address care management and care coordination needs related to Liver Cirrhosis as evidenced by adherence to CM Team Scheduled appointments     through collaboration with RN Care manager, provider, and care team.   Interventions:  Evaluation of current treatment plan related to  self management and patient's adherence to plan as established by provider   Liver Cirrhosis  (Status: Goal on Track (progressing):  YES.) Long Term Goal  Evaluation of current treatment plan related to  Liver Cirrhosis ,  self-management and patient's adherence to plan as established by provider. Discussed plans with patient for ongoing care management follow up and provided patient with direct contact information for care management team Advised patient to keep a calendar of visits, providers and contact information; Reviewed medications with patient and discussed the importance of taking lactulose  and having bowel movement every day; Assessed social determinant of health barriers;  Advised patient to discuss any concerns or questions regarding liver transplant with Hepatology-discussed sending MyChart message is the best way to communicate  Discussed the involvement of Palliative Care, patient felt services not needed at this time Provided therapeutic listening Discussed authorization is needed with [facility] and [facility] to access health information, advised patient to discuss at next visit   Patient Goals/Self-Care Activities: Take medications as prescribed   Attend all scheduled provider appointments Call provider office for new concerns or questions

## 2023-06-27 ENCOUNTER — Ambulatory Visit (INDEPENDENT_AMBULATORY_CARE_PROVIDER_SITE_OTHER): Payer: Medicaid Other | Admitting: Family Medicine

## 2023-06-27 ENCOUNTER — Encounter: Payer: Self-pay | Admitting: Family Medicine

## 2023-06-27 VITALS — BP 130/58 | HR 81 | Ht 71.0 in

## 2023-06-27 DIAGNOSIS — K219 Gastro-esophageal reflux disease without esophagitis: Secondary | ICD-10-CM

## 2023-06-27 DIAGNOSIS — D649 Anemia, unspecified: Secondary | ICD-10-CM | POA: Diagnosis not present

## 2023-06-27 DIAGNOSIS — L299 Pruritus, unspecified: Secondary | ICD-10-CM | POA: Diagnosis not present

## 2023-06-27 DIAGNOSIS — Z515 Encounter for palliative care: Secondary | ICD-10-CM | POA: Diagnosis not present

## 2023-06-27 DIAGNOSIS — K7031 Alcoholic cirrhosis of liver with ascites: Secondary | ICD-10-CM | POA: Diagnosis not present

## 2023-06-27 DIAGNOSIS — T8484XA Pain due to internal orthopedic prosthetic devices, implants and grafts, initial encounter: Secondary | ICD-10-CM

## 2023-06-27 MED ORDER — OXYCODONE HCL 15 MG PO TABS: 15.0000 mg | ORAL_TABLET | ORAL | 0 refills | Status: DC | PRN

## 2023-06-27 MED ORDER — PANTOPRAZOLE SODIUM 40 MG PO TBEC: 40.0000 mg | DELAYED_RELEASE_TABLET | Freq: Every day | ORAL | 3 refills | Status: AC

## 2023-06-27 MED ORDER — HYDROXYZINE PAMOATE 25 MG PO CAPS: 25.0000 mg | ORAL_CAPSULE | Freq: Three times a day (TID) | ORAL | 2 refills | Status: DC | PRN

## 2023-06-27 NOTE — Patient Instructions (Addendum)
 Thank you for coming to the office today.  Refilled pain medicine Oxycodone , Hydroxyzine , Pantoprazole   Labs today.  Concern for anemia.  Please schedule a Follow-up Appointment to: Return in about 4 weeks (around 07/25/2023) for 4 weeks MyChart Video Pain med - updates.  If you have any other questions or concerns, please feel free to call the office or send a message through MyChart. You may also schedule an earlier appointment if necessary.  Additionally, you may be receiving a survey about your experience at our office within a few days to 1 week by e-mail or mail. We value your feedback.  [name], DO [facility], [place]

## 2023-06-27 NOTE — Progress Notes (Signed)
 Subjective:    Patient ID: Tom [name], male    DOB: 1966/04/02, 58 y.o.   MRN: [id number]  Tom [name] is a 58 y.o. male presenting on 06/27/2023 for No chief complaint on file.   HPI  Discussed the use of AI scribe software for clinical note transcription with the patient, who gave verbal consent to proceed.  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 The patient reported a recent hospitalization due to a severe episode of hepatic encephalopathy with high ammonia level, during which he admitted to non-compliance with prescribed medications. The patient's caregiver noted that the patient was taking pain medications but neglecting other prescribed treatments. - Mental status has improved since discharge.  The patient described an urge to urinate but an inability to do so once attempting. This issue was speculated to be a side effect of Lasix  medication, which might be causing a fluid imbalance. - Also reported testicular scrotal pain in ED and it was evaluated, had Scrotal US  negative, this has subsided. He can void but has difficult if too many diuretics Furosemide  40mg  x 2 = 80mg  + Spironolactone  50mg  daily Edema has much improved  The patient's lower extremity pain was reported to be constant and severe, particularly in the ankle. The patient's caregiver noted that the patient's mobility had significantly decreased due to the pain, making it difficult for the patient to leave the house. - pain medicine is helpful, oxycodone , however does cause him to be confused and drowsy at times.  The patient's caregiver reported that the patient's diet had been significantly altered to manage his liver disease, with a focus on increasing protein  intake. The patient's caregiver also mentioned that the patient's fluid retention seemed to be improving, with swelling now confined to the ankle area. However, the patient's caregiver expressed concern about the patient's iron deficiency anemia, which had been worsening over the past few months.      05/16/2023    5:40 PM 02/22/2023    2:40 PM 03/21/2022    2:56 PM  Depression screen PHQ 2/9  Decreased Interest 0 0 0  Down, Depressed, Hopeless 0 0 0  PHQ - 2 Score 0 0 0  Altered sleeping 0  0  Tired, decreased energy 0  0  Change in appetite 0  0  Feeling bad or failure about yourself  0  0  Trouble concentrating 0  0  Moving slowly or fidgety/restless 0  0  Suicidal thoughts 0  0  PHQ-9 Score 0  0  Difficult doing work/chores Not difficult at all  Not difficult at all       09/21/2022   11:26 AM 12/16/2020    3:16 PM  GAD 7 : Generalized Anxiety Score  Nervous, Anxious, on Edge 0 0  Control/stop worrying 0 0  Worry too much - different things 0 0  Trouble relaxing 0 0  Restless 0 0  Easily annoyed or irritable 0 0  Afraid - awful might happen 0 0  Total GAD 7 Score 0 0  Anxiety Difficulty Not difficult at all Not difficult at all    Social History   Tobacco Use   Smoking status: Former    Current packs/day: 0.00    Average packs/day: 1 pack/day for 39.0 years (  39.0 ttl pk-yrs)    Types: Cigarettes    Start date: 12/1976    Quit date: 12/2015    Years since quitting: 7.5   Smokeless tobacco: Never  Vaping Use   Vaping status: Never Used  Substance Use Topics   Alcohol use: No   Drug use: Yes    Types: Marijuana    Review of Systems Per HPI unless specifically indicated above     Objective:    BP (!) 130/58   Pulse 81   Ht 5' 11 (1.803 m)   SpO2 96%   BMI 31.72 kg/m   Wt Readings from Last 3 Encounters:  06/01/23 227 lb 6.4 oz (103.1 kg)  05/16/23 227 lb 6.4 oz (103.1 kg)  04/06/23 231 lb (104.8 kg)    Physical Exam Vitals and nursing note  reviewed.  Constitutional:      General: He is not in acute distress.    Appearance: Normal appearance. He is well-developed. He is not diaphoretic.     Comments: Chronically ill-appearing, uncomfortable due to foot pain, cooperative  HENT:     Head: Normocephalic and atraumatic.  Eyes:     General:        Right eye: No discharge.        Left eye: No discharge.     Conjunctiva/sclera: Conjunctivae normal.  Cardiovascular:     Rate and Rhythm: Normal rate.  Pulmonary:     Effort: Pulmonary effort is normal.  Musculoskeletal:     Right lower leg: Edema present.     Left lower leg: Edema present.     Comments: Wheelchair bound. RLE with walking boot on  Skin:    General: Skin is warm and dry.     Findings: No erythema or rash.  Neurological:     Mental Status: He is alert and oriented to person, place, and time.  Psychiatric:        Mood and Affect: Mood normal.        Behavior: Behavior normal.        Thought Content: Thought content normal.     Comments: Well groomed, good eye contact, normal speech and thoughts     I have personally reviewed the radiology report from 06/01/23 on CT imaging.  CLINICAL DATA:  Mental status change, unknown cause.   EXAM: CT HEAD WITHOUT CONTRAST   TECHNIQUE: Contiguous axial images were obtained from the base of the skull through the vertex without intravenous contrast.   RADIATION DOSE REDUCTION: This exam was performed according to the departmental dose-optimization program which includes automated exposure control, adjustment of the mA and/or kV according to patient size and/or use of iterative reconstruction technique.   COMPARISON:  None Available.   FINDINGS: Brain: No evidence of acute infarction, hemorrhage, hydrocephalus, extra-axial collection or mass effect. Small focus of nodular calcification at the right caudothalamic groove measuring 4 mm, likely from old insult or calcified cavernoma. Reportedly, this is the  finding dating back to 2016 (see head CT report in care everywhere 08/23/2022).   Vascular: No hyperdense vessel or unexpected calcification.   Skull: Normal. Negative for fracture or focal lesion.   Sinuses/Orbits: No acute finding.   IMPRESSION: No acute finding or explanation for symptoms.     Electronically Signed   By: Dorn Roulette M.D.   On: 06/01/2023 19:43  -----------------------------------------------  CLINICAL DATA:  Abdominal pain.  History of cirrhosis.   EXAM: CT ABDOMEN AND PELVIS WITH CONTRAST   TECHNIQUE: Multidetector CT imaging  of the abdomen and pelvis was performed using the standard protocol following bolus administration of intravenous contrast.   RADIATION DOSE REDUCTION: This exam was performed according to the departmental dose-optimization program which includes automated exposure control, adjustment of the mA and/or kV according to patient size and/or use of iterative reconstruction technique.   CONTRAST:  OMNIPAQUE  IOHEXOL  350 MG/ML SOLN   COMPARISON:  No recent imaging studies other than limited ultrasound examinations 4 ascites.   FINDINGS: Lower chest: The lung bases are clear of an acute process. No infiltrates or effusions. There are multiple bilateral calcified granulomas noted. The heart is normal in size. No pericardial effusion. The distal esophagus is grossly normal. Small paraesophageal varices.   Hepatobiliary: Cirrhotic changes involving the liver but no worrisome hepatic lesions or intrahepatic biliary dilatation. The gallbladder is unremarkable. No common bile duct dilatation.   Pancreas: Moderate pancreatic atrophy but no mass, inflammation or ductal dilatation.   Spleen: Normal size.  Perisplenic collaterals are noted.   Adrenals/Urinary Tract: The adrenal glands and kidneys are unremarkable. No renal lesions or hydronephrosis. Bladder is unremarkable.   Stomach/Bowel: The stomach, duodenum, small bowel  and colon are grossly normal. No acute inflammatory process or obstructive findings. Diffuse colonic diverticulosis without findings for acute diverticulitis.   Vascular/Lymphatic: The aorta and branch vessels are patent. Scattered age advanced atherosclerotic calcifications. No aneurysm or dissection. The major venous structures are patent. The splenic and portal veins are patent but diminutive. There are massive portal venous collaterals. Scattered upper abdominal lymph nodes typical with cirrhosis. No mass or overt adenopathy.   Reproductive: The prostate gland and seminal vesicles are unremarkable.   Other: Moderate volume abdominal/pelvic ascites. Mild diffuse body wall edema.   Musculoskeletal: No significant bony findings.   IMPRESSION: 1. Cirrhotic changes involving the liver but no worrisome hepatic lesions or intrahepatic biliary dilatation. 2. Moderate volume abdominal/pelvic ascites. 3. Massive portal venous collaterals. 4. Diffuse colonic diverticulosis without findings for acute diverticulitis. 5. Age advanced atherosclerotic calcifications involving the aorta and branch vessels.   Aortic Atherosclerosis (ICD10-I70.0).     Electronically Signed   By: MYRTIS Stammer M.D.   On: 06/01/2023 19:49  -----------------------  CLINICAL DATA:  58 year old male with testicular pain for 1 day.   EXAM: SCROTAL ULTRASOUND   DOPPLER ULTRASOUND OF THE TESTICLES   TECHNIQUE: Complete ultrasound examination of the testicles, epididymis, and other scrotal structures was performed. Color and spectral Doppler ultrasound were also utilized to evaluate blood flow to the testicles.   COMPARISON:  CT Abdomen and Pelvis yesterday.   FINDINGS: Right testicle   Measurements: 4.1 x 2.0 x 2.2 cm. Heterogeneous right testicular echotexture (especially on series 1, image 14), but no discrete testicular mass or fluid.   Left testicle   Measurements: 4.0 x 1.8 x 2.1 cm.  Similar heterogeneous left testicle, coarse echotexture on image 51. No discrete left testicular mass or fluid.   Right epididymis: Normal in size and appearance. No hypervascularity.   Left epididymis: Normal in size and appearance. No hypervascularity.   Hydrocele:  None visualized.   Varicocele:  None visualized.   Pulsed Doppler interrogation of both testes demonstrates normal low resistance arterial and venous waveforms bilaterally.   IMPRESSION: Nonspecific bilateral testicular heterogeneity, but negative for discrete testicular mass or torsion.     Electronically Signed   By: VEAR Hurst M.D.   On: 06/02/2023 10:23   Results for orders placed or performed during the hospital encounter of 06/01/23  Comprehensive metabolic panel   Collection Time: 06/01/23 11:45 AM  Result Value Ref Range   Sodium 135 135 - 145 mmol/L   Potassium 3.6 3.5 - 5.1 mmol/L   Chloride 105 98 - 111 mmol/L   CO2 24 22 - 32 mmol/L   Glucose, Bld 129 (H) 70 - 99 mg/dL   BUN 40 (H) 6 - 20 mg/dL   Creatinine, Ser 8.72 (H) 0.61 - 1.24 mg/dL   Calcium  10.1 8.9 - 10.3 mg/dL   Total Protein 7.8 6.5 - 8.1 g/dL   Albumin  3.0 (L) 3.5 - 5.0 g/dL   AST 47 (H) 15 - 41 U/L   ALT 26 0 - 44 U/L   Alkaline Phosphatase 90 38 - 126 U/L   Total Bilirubin 1.7 (H) <1.2 mg/dL   GFR, Estimated >39 >39 mL/min   Anion gap 6 5 - 15  CBC with Differential   Collection Time: 06/01/23 11:45 AM  Result Value Ref Range   WBC 5.8 4.0 - 10.5 K/uL   RBC 3.65 (L) 4.22 - 5.81 MIL/uL   Hemoglobin 9.9 (L) 13.0 - 17.0 g/dL   HCT 68.6 (L) 60.9 - 47.9 %   MCV 85.8 80.0 - 100.0 fL   MCH 27.1 26.0 - 34.0 pg   MCHC 31.6 30.0 - 36.0 g/dL   RDW 84.2 (H) 88.4 - 84.4 %   Platelets 274 150 - 400 K/uL   nRBC 0.0 0.0 - 0.2 %   Neutrophils Relative % 51 %   Neutro Abs 2.9 1.7 - 7.7 K/uL   Lymphocytes Relative 26 %   Lymphs Abs 1.5 0.7 - 4.0 K/uL   Monocytes Relative 16 %   Monocytes Absolute 1.0 0.1 - 1.0 K/uL   Eosinophils  Relative 5 %   Eosinophils Absolute 0.3 0.0 - 0.5 K/uL   Basophils Relative 2 %   Basophils Absolute 0.1 0.0 - 0.1 K/uL   Immature Granulocytes 0 %   Abs Immature Granulocytes 0.01 0.00 - 0.07 K/uL  Ammonia   Collection Time: 06/01/23 11:45 AM  Result Value Ref Range   Ammonia 75 (H) 9 - 35 umol/L  Urinalysis, w/ Reflex to Culture (Infection Suspected) -Urine, Clean Catch   Collection Time: 06/01/23  8:16 PM  Result Value Ref Range   Specimen Source URINE, CLEAN CATCH    Color, Urine YELLOW (A) YELLOW   APPearance CLEAR (A) CLEAR   Specific Gravity, Urine 1.026 1.005 - 1.030   pH 7.0 5.0 - 8.0   Glucose, UA NEGATIVE NEGATIVE mg/dL   Hgb urine dipstick SMALL (A) NEGATIVE   Bilirubin Urine NEGATIVE NEGATIVE   Ketones, ur NEGATIVE NEGATIVE mg/dL   Protein, ur NEGATIVE NEGATIVE mg/dL   Nitrite NEGATIVE NEGATIVE   Leukocytes,Ua NEGATIVE NEGATIVE   RBC / HPF 0-5 0 - 5 RBC/hpf   WBC, UA 0-5 0 - 5 WBC/hpf   Bacteria, UA NONE SEEN NONE SEEN   Squamous Epithelial / HPF 0 0 - 5 /HPF  Protime-INR   Collection Time: 06/01/23 10:36 PM  Result Value Ref Range   Prothrombin Time 17.3 (H) 11.4 - 15.2 seconds   INR 1.4 (H) 0.8 - 1.2  APTT   Collection Time: 06/01/23 10:36 PM  Result Value Ref Range   aPTT 39 (H) 24 - 36 seconds  Lactate dehydrogenase   Collection Time: 06/01/23 10:36 PM  Result Value Ref Range   LDH 188 98 - 192 U/L  Comprehensive metabolic panel   Collection Time: 06/02/23  4:16  AM  Result Value Ref Range   Sodium 132 (L) 135 - 145 mmol/L   Potassium 3.4 (L) 3.5 - 5.1 mmol/L   Chloride 103 98 - 111 mmol/L   CO2 21 (L) 22 - 32 mmol/L   Glucose, Bld 88 70 - 99 mg/dL   BUN 33 (H) 6 - 20 mg/dL   Creatinine, Ser 8.83 0.61 - 1.24 mg/dL   Calcium  9.5 8.9 - 10.3 mg/dL   Total Protein 6.7 6.5 - 8.1 g/dL   Albumin  2.5 (L) 3.5 - 5.0 g/dL   AST 40 15 - 41 U/L   ALT 23 0 - 44 U/L   Alkaline Phosphatase 73 38 - 126 U/L   Total Bilirubin 1.5 (H) <1.2 mg/dL   GFR, Estimated  >39 >39 mL/min   Anion gap 8 5 - 15  CBC   Collection Time: 06/02/23  4:16 AM  Result Value Ref Range   WBC 5.0 4.0 - 10.5 K/uL   RBC 3.23 (L) 4.22 - 5.81 MIL/uL   Hemoglobin 8.6 (L) 13.0 - 17.0 g/dL   HCT 72.7 (L) 60.9 - 47.9 %   MCV 84.2 80.0 - 100.0 fL   MCH 26.6 26.0 - 34.0 pg   MCHC 31.6 30.0 - 36.0 g/dL   RDW 84.2 (H) 88.4 - 84.4 %   Platelets 229 150 - 400 K/uL   nRBC 0.0 0.0 - 0.2 %  Ammonia   Collection Time: 06/02/23  4:16 AM  Result Value Ref Range   Ammonia 68 (H) 9 - 35 umol/L      Assessment & Plan:   Problem List Items Addressed This Visit     Alcoholic cirrhosis of liver with ascites (HCC) - Primary   Relevant Orders   CBC with Differential/Platelet   COMPLETE METABOLIC PANEL WITH GFR   Ammonia   Iron, TIBC and Ferritin Panel   Normocytic anemia   Relevant Orders   CBC with Differential/Platelet   Iron, TIBC and Ferritin Panel   Other Visit Diagnoses       Painful orthopaedic hardware (HCC)       Relevant Medications   oxyCODONE  (ROXICODONE ) 15 MG immediate release tablet (Start on 06/28/2023)     Palliative care patient       Relevant Medications   oxyCODONE  (ROXICODONE ) 15 MG immediate release tablet (Start on 06/28/2023)     Itching       Relevant Medications   hydrOXYzine  (VISTARIL ) 25 MG capsule     Gastroesophageal reflux disease without esophagitis       Relevant Medications   pantoprazole  (PROTONIX ) 40 MG tablet        Cirrhosis, alcoholic Liver Disease Ascites  - controlled w/ paracentesis [facility] Hep[facility] Recent episode ED visit for hepatic encephalopathy due to non-compliance with medications. Currently on lactulose  and spironolactone . Patient's caregiver has increased protein intake to support liver function. -Continue current medications and dietary modifications. -Order blood work today to monitor liver function, kidney function, and anemia.  Urinary Retention Difficulty urinating, possibly due to fluid imbalance from  Lasix  use. -Consider adjusting Lasix  dose based on blood work results. Seems he is able to void except when on higher dose diuretic may have some limited voiding at times. Consider Urology if actual urological dysfunction concerns present going forward  Painful orthopedic hardware R ankle Chronic Pain Followed by Ortho Ongoing pain in the ankle, currently managed with pain medication. -Continue current pain management regimen. Consider return to Ortho, but already known limited options  due to cirrhosis  Anemia normocytic Last lab in hospital ED Hgb 8 range, had been 8-10 in past several months Secondary to chronic disease, cirrhosis -Monitor hemoglobin levels in today's blood work.  Follow-up -Schedule a video visit in 4 weeks to review blood work results and current symptoms. -Consider in-person visit if blood work results warrant further investigation or treatment adjustments.         Orders Placed This Encounter  Procedures   CBC with Differential/Platelet   COMPLETE METABOLIC PANEL WITH GFR   Ammonia   Iron, TIBC and Ferritin Panel    Meds ordered this encounter  Medications   oxyCODONE  (ROXICODONE ) 15 MG immediate release tablet    Sig: Take 1 tablet (15 mg total) by mouth every 4 (four) hours as needed for pain.    Dispense:  60 tablet    Refill:  0    First fill 06/28/2023   hydrOXYzine  (VISTARIL ) 25 MG capsule    Sig: Take 1 capsule (25 mg total) by mouth every 8 (eight) hours as needed for itching or anxiety.    Dispense:  90 capsule    Refill:  2   pantoprazole  (PROTONIX ) 40 MG tablet    Sig: Take 1 tablet (40 mg total) by mouth daily before breakfast.    Dispense:  90 tablet    Refill:  3    Follow up plan: Return in about 4 weeks (around 07/25/2023) for 4 weeks MyChart Video Pain med - updates.   [name], DO [facility] 06/27/2023, 4:03 PM

## 2023-06-28 LAB — COMPLETE METABOLIC PANEL WITH GFR
AG Ratio: 0.8 (calc) — ABNORMAL LOW (ref 1.0–2.5)
ALT: 26 U/L (ref 9–46)
AST: 35 U/L (ref 10–35)
Albumin: 3 g/dL — ABNORMAL LOW (ref 3.6–5.1)
Alkaline phosphatase (APISO): 96 U/L (ref 35–144)
BUN/Creatinine Ratio: 26 (calc) — ABNORMAL HIGH (ref 6–22)
BUN: 31 mg/dL — ABNORMAL HIGH (ref 7–25)
CO2: 26 mmol/L (ref 20–32)
Calcium: 9.3 mg/dL (ref 8.6–10.3)
Chloride: 98 mmol/L (ref 98–110)
Creat: 1.2 mg/dL (ref 0.70–1.30)
Globulin: 3.8 g/dL — ABNORMAL HIGH (ref 1.9–3.7)
Glucose, Bld: 113 mg/dL (ref 65–139)
Potassium: 4.4 mmol/L (ref 3.5–5.3)
Sodium: 129 mmol/L — ABNORMAL LOW (ref 135–146)
Total Bilirubin: 1.7 mg/dL — ABNORMAL HIGH (ref 0.2–1.2)
Total Protein: 6.8 g/dL (ref 6.1–8.1)
eGFR: 70 mL/min/{1.73_m2} (ref >=60)

## 2023-06-28 LAB — CBC WITH DIFFERENTIAL/PLATELET
Absolute Lymphocytes: 1154 {cells}/uL (ref 850–3900)
Absolute Monocytes: 1560 {cells}/uL — ABNORMAL HIGH (ref 200–950)
Basophils Absolute: 86 {cells}/uL (ref 0–200)
Basophils Relative: 1.1 %
Eosinophils Absolute: 179 {cells}/uL (ref 15–500)
Eosinophils Relative: 2.3 %
HCT: 28.6 % — ABNORMAL LOW (ref 38.5–50.0)
Hemoglobin: 9.4 g/dL — ABNORMAL LOW (ref 13.2–17.1)
MCH: 27.6 pg (ref 27.0–33.0)
MCHC: 32.9 g/dL (ref 32.0–36.0)
MCV: 83.9 fL (ref 80.0–100.0)
MPV: 10.5 fL (ref 7.5–12.5)
Monocytes Relative: 20 %
Neutro Abs: 4820 {cells}/uL (ref 1500–7800)
Neutrophils Relative %: 61.8 %
Platelets: 228 10*3/uL (ref 140–400)
RBC: 3.41 10*6/uL — ABNORMAL LOW (ref 4.20–5.80)
RDW: 14.5 % (ref 11.0–15.0)
Total Lymphocyte: 14.8 %
WBC: 7.8 10*3/uL (ref 3.8–10.8)

## 2023-06-28 LAB — IRON,TIBC AND FERRITIN PANEL
%SAT: 13 % — ABNORMAL LOW (ref 20–48)
Ferritin: 29 ng/mL — ABNORMAL LOW (ref 38–380)
Iron: 44 ug/dL — ABNORMAL LOW (ref 50–180)
TIBC: 326 ug/dL (ref 250–425)

## 2023-06-28 LAB — AMMONIA: Ammonia: 66 umol/L (ref <=72)

## 2023-06-29 ENCOUNTER — Ambulatory Visit: Payer: Self-pay

## 2023-06-29 NOTE — Telephone Encounter (Signed)
  Chief Complaint: Medication missing Symptoms: Pain Frequency: now Pertinent Negatives: Patient denies  Disposition: [] ED /[] Urgent Care (no appt availability in office) / [] Appointment(In office/virtual)/ []  [place] [facility]/ [] Home Care/ [] Refused Recommended Disposition /[] [facility]/ []  Follow-up with PCP Additional Notes: Pt stated that his pain medication has not been refilled. Order for refill was sent to pharmacy. Called pharmacy with pt on the phone. Refill has been called in, but not yet refilled. Pharmacy will refill now and will be ready for pick up in 1 hour.    Reason for Disposition  [1] Prescription refill request for ESSENTIAL medicine (i.e., likelihood of harm to patient if not taken) AND [2] triager unable to refill per department policy  Answer Assessment - Initial Assessment Questions 1. DRUG NAME: What medicine do you need to have refilled?     Roxicodone  5. SYMPTOMS: Do you have any symptoms?     pain  Protocols used: Medication Refill and Renewal Call-A-AH

## 2023-07-12 ENCOUNTER — Other Ambulatory Visit: Payer: Self-pay | Admitting: Family Medicine

## 2023-07-12 DIAGNOSIS — Z515 Encounter for palliative care: Secondary | ICD-10-CM

## 2023-07-12 DIAGNOSIS — T8484XA Pain due to internal orthopedic prosthetic devices, implants and grafts, initial encounter: Secondary | ICD-10-CM

## 2023-07-12 MED ORDER — OXYCODONE HCL 15 MG PO TABS: 15.0000 mg | ORAL_TABLET | ORAL | 0 refills | Status: DC | PRN

## 2023-07-14 ENCOUNTER — Ambulatory Visit: Payer: Medicaid Other | Admitting: Family Medicine

## 2023-07-14 ENCOUNTER — Emergency Department: Payer: Medicaid Other

## 2023-07-14 ENCOUNTER — Observation Stay
Admission: EM | Admit: 2023-07-14 | Discharge: 2023-07-15 | Disposition: A | Discharge status: Home / Self Care | Payer: Medicaid Other | Attending: Internal Medicine | Admitting: Internal Medicine

## 2023-07-14 ENCOUNTER — Other Ambulatory Visit: Payer: Self-pay

## 2023-07-14 ENCOUNTER — Encounter: Payer: Self-pay | Admitting: Family Medicine

## 2023-07-14 VITALS — BP 122/78 | HR 81 | Ht 71.0 in

## 2023-07-14 DIAGNOSIS — R140 Abdominal distension (gaseous): Secondary | ICD-10-CM | POA: Diagnosis not present

## 2023-07-14 DIAGNOSIS — I10 Essential (primary) hypertension: Secondary | ICD-10-CM | POA: Insufficient documentation

## 2023-07-14 DIAGNOSIS — R109 Unspecified abdominal pain: Secondary | ICD-10-CM | POA: Diagnosis present

## 2023-07-14 DIAGNOSIS — E871 Hypo-osmolality and hyponatremia: Secondary | ICD-10-CM | POA: Diagnosis not present

## 2023-07-14 DIAGNOSIS — K7682 Hepatic encephalopathy: Principal | ICD-10-CM | POA: Insufficient documentation

## 2023-07-14 DIAGNOSIS — R188 Other ascites: Secondary | ICD-10-CM | POA: Diagnosis not present

## 2023-07-14 DIAGNOSIS — J449 Chronic obstructive pulmonary disease, unspecified: Secondary | ICD-10-CM | POA: Diagnosis not present

## 2023-07-14 DIAGNOSIS — R5383 Other fatigue: Secondary | ICD-10-CM

## 2023-07-14 DIAGNOSIS — K7031 Alcoholic cirrhosis of liver with ascites: Secondary | ICD-10-CM | POA: Diagnosis not present

## 2023-07-14 DIAGNOSIS — N179 Acute kidney failure, unspecified: Secondary | ICD-10-CM | POA: Insufficient documentation

## 2023-07-14 LAB — COMPREHENSIVE METABOLIC PANEL
ALT: 27 U/L (ref 0–44)
AST: 47 U/L — ABNORMAL HIGH (ref 15–41)
Albumin: 3.3 g/dL — ABNORMAL LOW (ref 3.5–5.0)
Alkaline Phosphatase: 83 U/L (ref 38–126)
Anion gap: 13 (ref 5–15)
BUN: 43 mg/dL — ABNORMAL HIGH (ref 6–20)
CO2: 22 mmol/L (ref 22–32)
Calcium: 10.1 mg/dL (ref 8.9–10.3)
Chloride: 95 mmol/L — ABNORMAL LOW (ref 98–111)
Creatinine, Ser: 1.52 mg/dL — ABNORMAL HIGH (ref 0.61–1.24)
GFR, Estimated: 52 mL/min — ABNORMAL LOW (ref >60)
Glucose, Bld: 127 mg/dL — ABNORMAL HIGH (ref 70–99)
Potassium: 3.8 mmol/L (ref 3.5–5.1)
Sodium: 130 mmol/L — ABNORMAL LOW (ref 135–145)
Total Bilirubin: 1.6 mg/dL — ABNORMAL HIGH (ref 0.0–1.2)
Total Protein: 8 g/dL (ref 6.5–8.1)

## 2023-07-14 LAB — CBC
HCT: 33.3 % — ABNORMAL LOW (ref 39.0–52.0)
Hemoglobin: 10.9 g/dL — ABNORMAL LOW (ref 13.0–17.0)
MCH: 26.9 pg (ref 26.0–34.0)
MCHC: 32.7 g/dL (ref 30.0–36.0)
MCV: 82.2 fL (ref 80.0–100.0)
Platelets: 402 10*3/uL — ABNORMAL HIGH (ref 150–400)
RBC: 4.05 MIL/uL — ABNORMAL LOW (ref 4.22–5.81)
RDW: 15.1 % (ref 11.5–15.5)
WBC: 6.3 10*3/uL (ref 4.0–10.5)
nRBC: 0 % (ref 0.0–0.2)

## 2023-07-14 LAB — LIPASE, BLOOD: Lipase: 26 U/L (ref 11–51)

## 2023-07-14 LAB — PROTIME-INR
INR: 1.3 — ABNORMAL HIGH (ref 0.8–1.2)
Prothrombin Time: 16.3 s — ABNORMAL HIGH (ref 11.4–15.2)

## 2023-07-14 LAB — AMMONIA: Ammonia: 84 umol/L — ABNORMAL HIGH (ref 9–35)

## 2023-07-14 MED ORDER — ADULT MULTIVITAMIN W/MINERALS CH
1.0000 | ORAL_TABLET | Freq: Every day | ORAL | Status: DC
  Administered 2023-07-15: 1 via ORAL
  Filled 2023-07-14: qty 1

## 2023-07-14 MED ORDER — MAGNESIUM OXIDE -MG SUPPLEMENT 400 (240 MG) MG PO TABS
800.0000 mg | ORAL_TABLET | Freq: Two times a day (BID) | ORAL | Status: DC
  Administered 2023-07-14 – 2023-07-15 (×2): 800 mg via ORAL
  Filled 2023-07-14 (×2): qty 2

## 2023-07-14 MED ORDER — HYDROXYZINE HCL 25 MG PO TABS
25.0000 mg | ORAL_TABLET | Freq: Three times a day (TID) | ORAL | Status: DC | PRN
  Administered 2023-07-15: 25 mg via ORAL
  Filled 2023-07-14: qty 1

## 2023-07-14 MED ORDER — SPIRONOLACTONE 25 MG PO TABS
150.0000 mg | ORAL_TABLET | Freq: Every day | ORAL | Status: DC
  Administered 2023-07-15: 150 mg via ORAL
  Filled 2023-07-14: qty 6

## 2023-07-14 MED ORDER — ACETAMINOPHEN 325 MG PO TABS: 650.0000 mg | ORAL_TABLET | Freq: Four times a day (QID) | ORAL | Status: DC | PRN

## 2023-07-14 MED ORDER — ONDANSETRON HCL 4 MG/2ML IJ SOLN: 4.0000 mg | Freq: Four times a day (QID) | INTRAMUSCULAR | Status: DC | PRN

## 2023-07-14 MED ORDER — ENOXAPARIN SODIUM 40 MG/0.4ML IJ SOSY
40.0000 mg | PREFILLED_SYRINGE | INTRAMUSCULAR | Status: DC
  Administered 2023-07-14: 40 mg via SUBCUTANEOUS
  Filled 2023-07-14: qty 0.4

## 2023-07-14 MED ORDER — ONDANSETRON HCL 4 MG PO TABS: 4.0000 mg | ORAL_TABLET | Freq: Four times a day (QID) | ORAL | Status: DC | PRN

## 2023-07-14 MED ORDER — SODIUM CHLORIDE 0.9 % IV SOLN
INTRAVENOUS | Status: AC
Start: 2023-07-14 — End: 2023-07-15

## 2023-07-14 MED ORDER — LACTULOSE 10 GM/15ML PO SOLN
30.0000 g | Freq: Once | ORAL | Status: AC
  Administered 2023-07-14: 30 g via ORAL
  Filled 2023-07-14: qty 60

## 2023-07-14 MED ORDER — LACTULOSE 10 GM/15ML PO SOLN
45.0000 g | Freq: Two times a day (BID) | ORAL | Status: DC
  Administered 2023-07-15: 45 g via ORAL
  Filled 2023-07-14 (×2): qty 90

## 2023-07-14 MED ORDER — OXYCODONE HCL 5 MG PO TABS
15.0000 mg | ORAL_TABLET | Freq: Once | ORAL | Status: AC
Start: 2023-07-15 — End: 2023-07-15
  Administered 2023-07-15: 15 mg via ORAL
  Filled 2023-07-14: qty 3

## 2023-07-14 MED ORDER — ACETAMINOPHEN 650 MG RE SUPP: 650.0000 mg | Freq: Four times a day (QID) | RECTAL | Status: DC | PRN

## 2023-07-14 MED ORDER — FLUTICASONE FUROATE-VILANTEROL 100-25 MCG/ACT IN AEPB
1.0000 | INHALATION_SPRAY | Freq: Every day | RESPIRATORY_TRACT | Status: DC
  Administered 2023-07-15: 1 via RESPIRATORY_TRACT
  Filled 2023-07-14: qty 28

## 2023-07-14 MED ORDER — RIFAXIMIN 200 MG PO TABS
200.0000 mg | ORAL_TABLET | Freq: Two times a day (BID) | ORAL | Status: DC
  Filled 2023-07-14: qty 1

## 2023-07-14 MED ORDER — PANTOPRAZOLE SODIUM 40 MG PO TBEC
40.0000 mg | DELAYED_RELEASE_TABLET | Freq: Every day | ORAL | Status: DC
  Administered 2023-07-15: 40 mg via ORAL
  Filled 2023-07-14: qty 1

## 2023-07-14 MED ORDER — SODIUM CHLORIDE 0.9% FLUSH
3.0000 mL | Freq: Two times a day (BID) | INTRAVENOUS | Status: DC
  Administered 2023-07-14: 3 mL via INTRAVENOUS

## 2023-07-14 MED ORDER — THIAMINE MONONITRATE 100 MG PO TABS
100.0000 mg | ORAL_TABLET | Freq: Every day | ORAL | Status: DC
  Administered 2023-07-14 – 2023-07-15 (×2): 100 mg via ORAL
  Filled 2023-07-14 (×2): qty 1

## 2023-07-14 MED ORDER — RIFAXIMIN 550 MG PO TABS
550.0000 mg | ORAL_TABLET | Freq: Two times a day (BID) | ORAL | Status: DC
  Administered 2023-07-14 – 2023-07-15 (×2): 550 mg via ORAL
  Filled 2023-07-14 (×2): qty 1

## 2023-07-14 MED ORDER — OXYCODONE HCL 5 MG PO TABS
15.0000 mg | ORAL_TABLET | Freq: Once | ORAL | Status: AC
  Administered 2023-07-14: 15 mg via ORAL
  Filled 2023-07-14: qty 3

## 2023-07-14 NOTE — Assessment & Plan Note (Signed)
Blood currently within goal.  Patient takes Lasix and spironolactone at home. -Holding diuretic today due to concern of AKI -Continue to monitor

## 2023-07-14 NOTE — Assessment & Plan Note (Signed)
Patient with worsening confusion and somnolence, taking his lactulose but not sure whether he was having regular bowel movements. Liver enzymes relatively stable.  Ammonia elevated at 84 Paracentesis was ordered by EDP, but ultrasound did not show any significant amount of ascites so paracentesis was not done. With increased dose of lactulose he had 3 soft bowel movements, mentation improved back to baseline.

## 2023-07-14 NOTE — Assessment & Plan Note (Signed)
Mild hyponatremia with sodium at 130, seems chronic, likely due to underlying liver cirrhosis. -Giving some normal saline -Monitor sodium

## 2023-07-14 NOTE — H&P (Signed)
History and Physical    Patient: Tom [name] WJX:[id number] DOB: Nov 05, 1965 DOA: 07/14/2023 DOS: the patient was seen and examined on 07/14/2023 PCP: [name], DO  Patient coming from: Home  Chief Complaint:  Chief Complaint  Patient presents with   Abdominal Pain   HPI: Tom [name] is a 58 y.o. male with medical history significant of alcoholic liver cirrhosis, hypertension and COPD was sent to ED from his PCP for concern of elevated ammonia level.  Patient was having increased confusion and somnolence since Tuesday.  Apparently compliant with his medications but unable to tell whether he was having regular bowel movements.  His girlfriend took him to his PCP this morning and they did some lab work and due to elevated ammonia levels was asked to come to ED for further evaluation.  Patient denies any recent illness.  Was not feeling overall well and stating that I am losing my mind over the past few days.  Decreased p.o. intake due to increased somnolence.  No abdominal pain.  Unable to answer questions however about the number of daily bowel movements, per patient he did had 1 bowel movement yesterday.  No nausea or vomiting.  No fever or chills.  He thinks that he is having little worsening of abdominal distention and wants to have a paracentesis, last 1 was done in December 2024.  He also has chronic difficulty urinating.  Denies any other urinary symptoms.  ED course and data reviewed.  On presentation hemodynamically stable, increased somnolence, labs with sodium of 138, chloride 95, BUN 43, creatinine 1.52, albumin 3.3, AST 47, T. bili 1.6, hemoglobin 10.9 which is better than his baseline, no leukocytosis, ammonia 86, INR 1.3 which is also at baseline, lipase normal. Ultrasound ascites was ordered by EDP.  Hospitalist was consulted for admission with concern of hepatic encephalopathy.  Patient was given a dose of lactulose, also starting on normal  saline for 1 day.  Review of Systems: As mentioned in the history of present illness. All other systems reviewed and are negative. Past Medical History:  Diagnosis Date   Arthritis    Asthma    COPD (chronic obstructive pulmonary disease) (HCC)    History of ankle fusion    right   Hyperlipidemia    Hypertension    Hypokalemia 12/04/2022   Hypomagnesemia 12/04/2022   Hypomagnesemia 12/04/2022   Osteoporosis    Past Surgical History:  Procedure Laterality Date   ANKLE FUSION Right 2017   BACK SURGERY     TOTAL KNEE ARTHROPLASTY Right 04/2012   Social History:  reports that he quit smoking about 7 years ago. His smoking use included cigarettes. He started smoking about 46 years ago. He has a 39 pack-year smoking history. He has never used smokeless tobacco. He reports current drug use. Drug: Marijuana. He reports that he does not drink alcohol.  Allergies  Allergen Reactions   Alfuzosin Other (See Comments)    Dizziness    Amlodipine Nausea And Vomiting   Bee Venom Swelling   Cymbalta [Duloxetine Hcl] Other (See Comments)    Emesis    Duloxetine Other (See Comments)    Emesis   Lisinopril    Flomax [Tamsulosin Hcl] Itching    Family History  Problem Relation Age of Onset   Lung cancer Mother    Kidney disease Father    Cancer Brother     Prior to Admission medications   Medication Sig Start Date End Date Taking? Authorizing Provider  albuterol (  PROVENTIL) (2.5 MG/3ML) 0.083% nebulizer solution Take 3 mLs (2.5 mg total) by nebulization every 4 (four) hours as needed for wheezing or shortness of breath. 11/16/22   Sreenath, Jonelle Sports, MD  albuterol (VENTOLIN HFA) 108 (90 Base) MCG/ACT inhaler Inhale 2 puffs into the lungs every 4 (four) hours as needed. 05/16/23   [name], [name], DO  budesonide-formoterol (SYMBICORT) 80-4.5 MCG/ACT inhaler TAKE 2 PUFFS FIRST THING IN AM AND THEN ANOTHER 2 PUFFS ABOUT 12 HOURS LATER. 04/03/23   [name], [name], DO   cholestyramine (QUESTRAN) 4 g packet Take 1 packet (4 g total) by mouth 2 (two) times daily. 06/02/23   Sunnie Nielsen, DO  EPINEPHrine (EPIPEN 2-PAK) 0.3 mg/0.3 mL IJ SOAJ injection Inject 0.3 mg into the muscle as needed for anaphylaxis. 12/16/21   [name], [name], DO  furosemide (LASIX) 40 MG tablet Take 80 mg by mouth daily. 05/22/23   [provider]  Homeopathic Products (LIVER SUPPORT SL) Place under the tongue.    [provider]  hydrOXYzine (VISTARIL) 25 MG capsule Take 1 capsule (25 mg total) by mouth every 8 (eight) hours as needed for itching or anxiety. 06/27/23   [name], [name], DO  magnesium oxide (MAG-OX) 400 (240 Mg) MG tablet Take 2 tablets by mouth 2 (two) times daily. 09/14/22   [provider]  Multiple Vitamin (MULTIVITAMIN) capsule Take 1 capsule by mouth daily.    [provider]  ondansetron (ZOFRAN-ODT) 4 MG disintegrating tablet Take 1 tablet (4 mg total) by mouth every 8 (eight) hours as needed for nausea or vomiting. 06/02/23   Sunnie Nielsen, DO  oxyCODONE (ROXICODONE) 15 MG immediate release tablet Take 1 tablet (15 mg total) by mouth every 4 (four) hours as needed for pain. 07/12/23   [name], [name], DO  pantoprazole (PROTONIX) 40 MG tablet Take 1 tablet (40 mg total) by mouth daily before breakfast. 06/27/23   [name], [name], DO  rifaximin (XIFAXAN) 200 MG tablet Take 200 mg by mouth 2 (two) times daily.    [provider]  spironolactone (ALDACTONE) 50 MG tablet Take 150 mg by mouth daily. 05/22/23   [provider]  thiamine (VITAMIN B1) 100 MG tablet Take 1 tablet (100 mg total) by mouth daily. 04/05/23   [name], DO    Physical Exam: Vitals:   07/14/23 1037 07/14/23 1300 07/14/23 1330  BP: 119/65 119/69 130/72  Pulse: 72 76 73  Resp: 18    Temp: 98 F (36.7 C)    SpO2: 100% 100% 100%  Weight: 90.7 kg    Height: 5\' 11"  (1.803 m)      General:  Vital signs reviewed.  Patient little somnolent and appears malnourished, in no acute distress and cooperative with exam.  Head: Normocephalic and atraumatic. Eyes: EOMI, conjunctivae normal, no scleral icterus.  Neck: Supple, trachea midline, normal ROM,  Cardiovascular: RRR, S1 normal, S2 normal, no murmurs, gallops, or rubs. Pulmonary/Chest: Clear to auscultation bilaterally, no wheezes, rales, or rhonchi. Abdominal: Soft, non-tender, mildly distended, BS +, Extremities: Right lower extremity lymphedema Neurological: Somnolent, no apparent deficit  Data Reviewed: Prior data reviewed as mentioned above.  Assessment and Plan: * Hepatic encephalopathy (HCC) Patient with worsening confusion and somnolence, taking his lactulose but not sure whether he was having regular bowel movements as he was unable to provide appropriate answers. Liver enzymes relatively stable.  Ammonia elevated at 84 Paracentesis was ordered by EDP -Admit to MedSurg -Increase lactulose to 45 g twice  daily-titrate to have 2-3 soft bowel movement -Continue rifaximin -Continue with supportive care  AKI (acute kidney injury) (HCC) Creatinine at 1.52, likely due to decreased p.o. intake and continue to take Lasix.  Clinically appears dry.  Not much ascites. -Holding home Lasix and spironolactone today -Giving some gentle IV fluid -Monitor renal function -Avoid nephrotoxins  COPD (chronic obstructive pulmonary disease) (HCC) No concern of exacerbation. -Continue home bronchodilators  Hypertension Blood currently within goal.  Patient takes Lasix and spironolactone at home. -Holding diuretic today due to concern of AKI -Continue to monitor  Hypomagnesemia -Continue home magnesium supplement  Hyponatremia Mild hyponatremia with sodium at 130, seems chronic, likely due to underlying liver cirrhosis. -Giving some normal saline -Monitor sodium    Advance Care Planning:   Code Status: Full Code   Consults:  None  Family Communication: Discussed with sister at bedside  Severity of Illness: The appropriate patient status for this patient is OBSERVATION. Observation status is judged to be reasonable and necessary in order to provide the required intensity of service to ensure the patient's safety. The patient's presenting symptoms, physical exam findings, and initial radiographic and laboratory data in the context of their medical condition is felt to place them at decreased risk for further clinical deterioration. Furthermore, it is anticipated that the patient will be medically stable for discharge from the hospital within 2 midnights of admission.   This record has been created using Conservation officer, historic buildings. Errors have been sought and corrected,but may not always be located. Such creation errors do not reflect on the standard of care.   Author: Arnetha Courser, MD 07/14/2023 2:01 PM  For on call review [email or web address].

## 2023-07-14 NOTE — Assessment & Plan Note (Signed)
Creatinine at 1.71, likely due to decreased p.o. intake and continue to take Lasix.  Clinically appears dry.  Not much ascites. -Holding home Lasix and spironolactone during hospitalization, he was instructed to restart Lasix in next few days at a lower dose of 40 mg daily.  He need to have his levels repeated by PCP in next few days.  Renal ultrasound was negative for any acute abnormality

## 2023-07-14 NOTE — Assessment & Plan Note (Signed)
-  Continue home magnesium supplement

## 2023-07-14 NOTE — Assessment & Plan Note (Signed)
No concern of exacerbation. -Continue home bronchodilators

## 2023-07-14 NOTE — Patient Instructions (Addendum)
Thank you for coming to the office today.  Please go directly to [facility] ED for evaluation of labs including Ammonia and Possible Paracentesis treatment.  Please schedule a Follow-up Appointment to: Return if symptoms worsen or fail to improve.  If you have any other questions or concerns, please feel free to call the office or send a message through MyChart. You may also schedule an earlier appointment if necessary.  Additionally, you may be receiving a survey about your experience at our office within a few days to 1 week by e-mail or mail. We value your feedback.  Saralyn Pilar, DO [facility], [place]

## 2023-07-14 NOTE — Progress Notes (Signed)
Subjective:    Patient ID: Tom [name], male    DOB: [birth year], 58 y.o.   MRN: [id number]  Tom [name] is a 58 y.o. male presenting on 07/14/2023 for Cirrhosis (Confusion, Lethargy)   HPI  Discussed the use of AI scribe software for clinical note transcription with the patient, who gave verbal consent to proceed.  History provided by primary caregiver  History of Present Illness    Alcoholic Cirrhosis, with Ascites Concern for Hepatic Encephalopathy  The patient, with a history of liver disease, presents with a three to four-day history of confusion, generalized body pain, and nausea. He reports waking up in the middle of the night feeling unwell, with symptoms of sweating and cognitive impairment. The patient's caregiver has noticed an increase in abdominal distension, suggesting possible fluid accumulation again. He had been receiving frequent paracentesis from [facility] ED, last done on 06/05/23, he had limited to no fluid drained past several attempts, so the appointments were paused.  The patient's last known ammonia level was within the normal range at 66 (06/27/23), albeit on the higher side. The patient has been taking lactulose for the management of his liver disease, but the current symptoms suggest a possible increase in ammonia levels or other complications related to his liver condition. The patient denies any abdominal pain on palpation. The patient's vitals, including blood pressure and pulse, were reported to be within normal limits. The patient's overall condition has been deteriorating over the past few days, leading to the current state of confusion and discomfort.         05/16/2023    5:40 PM 02/22/2023    2:40 PM 03/21/2022    2:56 PM  Depression screen PHQ 2/9  Decreased Interest 0 0 0  Down, Depressed, Hopeless 0 0 0  PHQ - 2 Score 0 0 0  Altered sleeping 0  0  Tired, decreased energy 0  0  Change in appetite 0  0  Feeling bad or failure about  yourself  0  0  Trouble concentrating 0  0  Moving slowly or fidgety/restless 0  0  Suicidal thoughts 0  0  PHQ-9 Score 0  0  Difficult doing work/chores Not difficult at all  Not difficult at all       09/21/2022   11:26 AM 12/16/2020    3:16 PM  GAD 7 : Generalized Anxiety Score  Nervous, Anxious, on Edge 0 0  Control/stop worrying 0 0  Worry too much - different things 0 0  Trouble relaxing 0 0  Restless 0 0  Easily annoyed or irritable 0 0  Afraid - awful might happen 0 0  Total GAD 7 Score 0 0  Anxiety Difficulty Not difficult at all Not difficult at all    Social History   Tobacco Use   Smoking status: Former    Current packs/day: 0.00    Average packs/day: 1 pack/day for 39.0 years (39.0 ttl pk-yrs)    Types: Cigarettes    Start date: 12/1976    Quit date: 12/2015    Years since quitting: 7.5   Smokeless tobacco: Never  Vaping Use   Vaping status: Never Used  Substance Use Topics   Alcohol use: No   Drug use: Yes    Types: Marijuana    Review of Systems Per HPI unless specifically indicated above     Objective:    BP 122/78   Pulse 81   Ht 5\' 11"  (1.803 m)  SpO2 99%   BMI 31.72 kg/m   Wt Readings from Last 3 Encounters:  06/01/23 227 lb 6.4 oz (103.1 kg)  05/16/23 227 lb 6.4 oz (103.1 kg)  04/06/23 231 lb (104.8 kg)    Physical Exam Vitals and nursing note reviewed.  Constitutional:      General: He is not in acute distress.    Appearance: Normal appearance. He is well-developed. He is not diaphoretic.     Comments: Chronically ill-appearing, uncomfortable  HENT:     Head: Normocephalic and atraumatic.  Eyes:     General:        Right eye: No discharge.        Left eye: No discharge.     Conjunctiva/sclera: Conjunctivae normal.  Cardiovascular:     Rate and Rhythm: Normal rate.  Pulmonary:     Effort: Pulmonary effort is normal.  Abdominal:     General: Bowel sounds are normal. There is distension.     Palpations: Abdomen is soft.  There is no mass.     Tenderness: There is no abdominal tenderness. There is no guarding or rebound.     Hernia: No hernia is present.  Musculoskeletal:     Right lower leg: Edema present.     Left lower leg: Edema present.     Comments: Wheelchair bound. RLE with walking boot on  Skin:    General: Skin is warm and dry.     Findings: No erythema or rash.  Neurological:     Mental Status: He is alert.     Comments: Lethargic, drowsy  Psychiatric:        Mood and Affect: Mood normal.     Results for orders placed or performed in visit on 06/27/23  CBC with Differential/Platelet   Collection Time: 06/27/23  4:29 PM  Result Value Ref Range   WBC 7.8 3.8 - 10.8 Thousand/uL   RBC 3.41 (L) 4.20 - 5.80 Million/uL   Hemoglobin 9.4 (L) 13.2 - 17.1 g/dL   HCT 54.0 (L) 98.1 - 19.1 %   MCV 83.9 80.0 - 100.0 fL   MCH 27.6 27.0 - 33.0 pg   MCHC 32.9 32.0 - 36.0 g/dL   RDW 47.8 29.5 - 62.1 %   Platelets 228 140 - 400 Thousand/uL   MPV 10.5 7.5 - 12.5 fL   Neutro Abs 4,820 1,500 - 7,800 cells/uL   Absolute Lymphocytes 1,154 850 - 3,900 cells/uL   Absolute Monocytes 1,560 (H) 200 - 950 cells/uL   Eosinophils Absolute 179 15 - 500 cells/uL   Basophils Absolute 86 0 - 200 cells/uL   Neutrophils Relative % 61.8 %   Total Lymphocyte 14.8 %   Monocytes Relative 20.0 %   Eosinophils Relative 2.3 %   Basophils Relative 1.1 %  COMPLETE METABOLIC PANEL WITH GFR   Collection Time: 06/27/23  4:29 PM  Result Value Ref Range   Glucose, Bld 113 65 - 139 mg/dL   BUN 31 (H) 7 - 25 mg/dL   Creat 3.08 6.57 - 8.46 mg/dL   eGFR 70 > OR = 60 NG/EXB/2.84X3   BUN/Creatinine Ratio 26 (H) 6 - 22 (calc)   Sodium 129 (L) 135 - 146 mmol/L   Potassium 4.4 3.5 - 5.3 mmol/L   Chloride 98 98 - 110 mmol/L   CO2 26 20 - 32 mmol/L   Calcium 9.3 8.6 - 10.3 mg/dL   Total Protein 6.8 6.1 - 8.1 g/dL   Albumin 3.0 (L) 3.6 - 5.1 g/dL  Globulin 3.8 (H) 1.9 - 3.7 g/dL (calc)   AG Ratio 0.8 (L) 1.0 - 2.5 (calc)    Total Bilirubin 1.7 (H) 0.2 - 1.2 mg/dL   Alkaline phosphatase (APISO) 96 35 - 144 U/L   AST 35 10 - 35 U/L   ALT 26 9 - 46 U/L  Ammonia   Collection Time: 06/27/23  4:29 PM  Result Value Ref Range   Ammonia 66 < OR = 72 umol/L  Iron, TIBC and Ferritin Panel   Collection Time: 06/27/23  4:29 PM  Result Value Ref Range   Iron 44 (L) 50 - 180 mcg/dL   TIBC 161 096 - 045 mcg/dL (calc)   %SAT 13 (L) 20 - 48 % (calc)   Ferritin 29 (L) 38 - 380 ng/mL      Assessment & Plan:   Problem List Items Addressed This Visit     Alcoholic cirrhosis of liver with ascites (HCC)   Hepatic encephalopathy (HCC) - Primary   Other Visit Diagnoses       Lethargy            Suspected Acute Hepatic Encephalopathy Alcoholic Cirrhosis with Ascites  Last Ammonia 66 (06/27/2023) Last Paracentesis 06/05/23  Patient presents with confusion, generalized discomfort, nausea, and abdominal distension 3-4 days. Concern for elevated ammonia levels and possible infection SBP cannot rule out  -Refer to ER for immediate evaluation including labs, imaging, and possible paracentesis.  Discussed future hospice vs palliative care as well. He has been on palliative service in past. Less active now, but may reconsider hospice eval in future if continues to demonstrate decline. Patient and spouse have not been interested in hospice in the past. But we can reconsider if needed.  Advanced Care Planning Discussion of advanced directives and legal representation. No decisions made at this time. -Encourage patient and family to complete advanced directive paperwork when patient is mentally clear. -Consider referral to social work or case management for assistance with advanced directive planning.         No orders of the defined types were placed in this encounter.   No orders of the defined types were placed in this encounter.   Follow up plan: Return if symptoms worsen or fail to improve.  Saralyn Pilar, DO [facility] 07/14/2023, 9:36 AM

## 2023-07-14 NOTE — Procedures (Signed)
Patient presents for a diagnostic and therapeutic paracentesis. [place] limited shows a trace amount of peritoneal fluid. Insufficient to perform a safe paracentesis. Procedure not performed.  Ir will defer the paracentesis at this time.  Procedure not performed.

## 2023-07-14 NOTE — ED Provider Notes (Signed)
[facility] Provider Note    Event Date/Time   First MD Initiated Contact with Patient 07/14/23 1217     (approximate)   History   Abdominal Pain   HPI  Tom [name] is a 58 y.o. male with history of alcoholic cirrhosis who comes in with can worsening confusion.  Patient has reportedly been compliant with his lactulose.  He was seen by his primary care doctor due to increasing confusion and concerns for little bit of distention.  They have not needed a paracentesis since 06/05/2023.  His last ammonia was 66.  He was sent in for further evaluation.  According to the sister his partner helps to his medications and they are not aware of any missed doses.  They stated this is exactly how he presents when he has elevated ammonia levels.  They deny any falls, headaches.  Patient is able to wake up state his name although he does seem more drowsy.  He denies any abdominal pain but the sister does report that it does seem a little bit more distended and she was not sure if the fluid was building back up.  Physical Exam   Triage Vital Signs: ED Triage Vitals [07/14/23 1037]  Encounter Vitals Group     BP 119/65     Systolic BP Percentile      Diastolic BP Percentile      Pulse Rate 72     Resp 18     Temp 98 F (36.7 C)     Temp src      SpO2 100 %     Weight 200 lb (90.7 kg)     Height 5\' 11"  (1.803 m)     Head Circumference      Peak Flow      Pain Score 5     Pain Loc      Pain Education      Exclude from Growth Chart     Most recent vital signs: Vitals:   07/14/23 1037  BP: 119/65  Pulse: 72  Resp: 18  Temp: 98 F (36.7 C)  SpO2: 100%     General: Awake, no distress.  CV:  Good peripheral perfusion.  Resp:  Normal effort.  Abd:  No obvious distention soft and nontender Other:  Patient has chronic injury to the right leg so pain associated with that.  Otherwise able to lift both legs up off the bed slightly good grip strength.  No  obvious cranial nerve deficits.  Patient does appear sleepy but is able to easily wake up alert and oriented x 2.   ED Results / Procedures / Treatments   Labs (all labs ordered are listed, but only abnormal results are displayed) Labs Reviewed  COMPREHENSIVE METABOLIC PANEL - Abnormal; Notable for the following components:      Result Value   Sodium 130 (*)    Chloride 95 (*)    Glucose, Bld 127 (*)    BUN 43 (*)    Creatinine, Ser 1.52 (*)    Albumin 3.3 (*)    AST 47 (*)    Total Bilirubin 1.6 (*)    GFR, Estimated 52 (*)    All other components within normal limits  CBC - Abnormal; Notable for the following components:   RBC 4.05 (*)    Hemoglobin 10.9 (*)    HCT 33.3 (*)    Platelets 402 (*)    All other components within normal limits  AMMONIA -  Abnormal; Notable for the following components:   Ammonia 84 (*)    All other components within normal limits  LIPASE, BLOOD  URINALYSIS, ROUTINE W REFLEX MICROSCOPIC  PROTIME-INR     EKG  My interpretation of EKG:     PROCEDURES:  Critical Care performed: No  Procedures   MEDICATIONS ORDERED IN ED: Medications  lactulose (CHRONULAC) 10 GM/15ML solution 30 g (has no administration in time range)     IMPRESSION / MDM / ASSESSMENT AND PLAN / ED COURSE  I reviewed the triage vital signs and the nursing notes.   Patient's presentation is most consistent with acute presentation with potential threat to life or bodily function.   Patient comes in with known liver cirrhosis with concerns for worsening hepatic encephalopathy.  Will give a dose of lactulose given ammonia level is elevated.  Patient has no trauma, consistent with his prior episodes of hepatic cephalopathy so discussed CT imaging and family opted to hold off which I think is very reasonable.  His liver function test are similar to his prior will add on INR.  His abdomen although they report is more distended than normal and is overall very soft and  nontender.  I do not feel any significant distention and nothing that I feel like I could safely bedside ultrasound.  I will place an IR consultation so they can evaluate.      FINAL CLINICAL IMPRESSION(S) / ED DIAGNOSES   Final diagnoses:  Hepatic encephalopathy (HCC)     Rx / DC Orders   ED Discharge Orders     None        Note:  This document was prepared using Dragon voice recognition software and may include unintentional dictation errors.   Concha Se, MD 07/14/23 1255

## 2023-07-14 NOTE — ED Triage Notes (Addendum)
Pt comes with c/o abdominal pain that started few days ago. Pt does have cirrhosis of liver. Pt has some distention in belly per family. Family states possible elevated ammonia level.   Pt states he just feels tired and no energy. Pt has also been confused and not at his baseline per family. Pt does have slight yellow to whites of eyes and skin color.  Pt also has swelling right leg and foot prior fusion on his ankle.

## 2023-07-14 NOTE — ED Notes (Signed)
Bladder scan done. 236 mL.

## 2023-07-15 ENCOUNTER — Observation Stay: Payer: Medicaid Other

## 2023-07-15 DIAGNOSIS — I10 Essential (primary) hypertension: Secondary | ICD-10-CM | POA: Diagnosis not present

## 2023-07-15 DIAGNOSIS — K7682 Hepatic encephalopathy: Secondary | ICD-10-CM | POA: Diagnosis not present

## 2023-07-15 DIAGNOSIS — J449 Chronic obstructive pulmonary disease, unspecified: Secondary | ICD-10-CM | POA: Diagnosis not present

## 2023-07-15 DIAGNOSIS — N179 Acute kidney failure, unspecified: Secondary | ICD-10-CM | POA: Diagnosis not present

## 2023-07-15 DIAGNOSIS — R188 Other ascites: Secondary | ICD-10-CM | POA: Diagnosis not present

## 2023-07-15 DIAGNOSIS — E871 Hypo-osmolality and hyponatremia: Secondary | ICD-10-CM | POA: Diagnosis not present

## 2023-07-15 LAB — CBC
HCT: 30.6 % — ABNORMAL LOW (ref 39.0–52.0)
Hemoglobin: 9.8 g/dL — ABNORMAL LOW (ref 13.0–17.0)
MCH: 26.4 pg (ref 26.0–34.0)
MCHC: 32 g/dL (ref 30.0–36.0)
MCV: 82.5 fL (ref 80.0–100.0)
Platelets: 377 10*3/uL (ref 150–400)
RBC: 3.71 MIL/uL — ABNORMAL LOW (ref 4.22–5.81)
RDW: 15.1 % (ref 11.5–15.5)
WBC: 7.3 10*3/uL (ref 4.0–10.5)
nRBC: 0 % (ref 0.0–0.2)

## 2023-07-15 LAB — COMPREHENSIVE METABOLIC PANEL
ALT: 25 U/L (ref 0–44)
AST: 41 U/L (ref 15–41)
Albumin: 3 g/dL — ABNORMAL LOW (ref 3.5–5.0)
Alkaline Phosphatase: 82 U/L (ref 38–126)
Anion gap: 12 (ref 5–15)
BUN: 41 mg/dL — ABNORMAL HIGH (ref 6–20)
CO2: 22 mmol/L (ref 22–32)
Calcium: 9.6 mg/dL (ref 8.9–10.3)
Chloride: 95 mmol/L — ABNORMAL LOW (ref 98–111)
Creatinine, Ser: 1.75 mg/dL — ABNORMAL HIGH (ref 0.61–1.24)
GFR, Estimated: 44 mL/min — ABNORMAL LOW (ref >60)
Glucose, Bld: 104 mg/dL — ABNORMAL HIGH (ref 70–99)
Potassium: 4.2 mmol/L (ref 3.5–5.1)
Sodium: 129 mmol/L — ABNORMAL LOW (ref 135–145)
Total Bilirubin: 1.3 mg/dL — ABNORMAL HIGH (ref 0.0–1.2)
Total Protein: 7.5 g/dL (ref 6.5–8.1)

## 2023-07-15 LAB — URINALYSIS, COMPLETE (UACMP) WITH MICROSCOPIC
Bacteria, UA: NONE SEEN
Bilirubin Urine: NEGATIVE
Glucose, UA: NEGATIVE mg/dL
Hgb urine dipstick: NEGATIVE
Ketones, ur: NEGATIVE mg/dL
Leukocytes,Ua: NEGATIVE
Nitrite: NEGATIVE
Protein, ur: NEGATIVE mg/dL
Specific Gravity, Urine: 1.014 (ref 1.005–1.030)
Squamous Epithelial / HPF: 0 /[HPF] (ref 0–5)
WBC, UA: 0 WBC/hpf (ref 0–5)
pH: 6 (ref 5.0–8.0)

## 2023-07-15 LAB — PROTIME-INR
INR: 1.3 — ABNORMAL HIGH (ref 0.8–1.2)
Prothrombin Time: 16.8 s — ABNORMAL HIGH (ref 11.4–15.2)

## 2023-07-15 MED ORDER — OXYCODONE HCL 5 MG PO TABS
15.0000 mg | ORAL_TABLET | Freq: Four times a day (QID) | ORAL | Status: DC | PRN
  Administered 2023-07-15: 15 mg via ORAL
  Filled 2023-07-15: qty 3

## 2023-07-15 MED ORDER — LACTULOSE 10 GM/15ML PO SOLN: 45.0000 g | Freq: Two times a day (BID) | ORAL | 2 refills | Status: DC

## 2023-07-15 MED ORDER — FUROSEMIDE 40 MG PO TABS: 40.0000 mg | ORAL_TABLET | Freq: Every day | ORAL | 1 refills | Status: AC

## 2023-07-15 NOTE — Discharge Summary (Signed)
Physician Discharge Summary   Patient: Tom [name] MRN: [id number] DOB: [birth year]  Admit date:     07/14/2023  Discharge date: 07/15/23  Discharge Physician: Arnetha Courser   PCP: [name], DO   Recommendations at discharge:  Please obtain CBC and CMP early next week, patient's creatinine is up from his baseline and need monitoring. Decreasing the dose of Lasix to 40, please titrate as appropriate. We increased the dose of lactulose-need titration based on 2-3 soft bowel movements as a goal. Follow-up with primary care provider Follow-up with gastroenterology/hepatology  Discharge Diagnoses: Principal Problem:   Hepatic encephalopathy (HCC) Active Problems:   Ascites of liver   AKI (acute kidney injury) (HCC)   COPD (chronic obstructive pulmonary disease) (HCC)   Hypertension   Hypomagnesemia   Hyponatremia   Hospital Course:  Tom [name] is a 58 y.o. male with medical history significant of alcoholic liver cirrhosis, hypertension and COPD was sent to ED from his PCP for concern of elevated ammonia level.   Patient was having increased confusion and somnolence since Tuesday.  Apparently compliant with his medications but unable to tell whether he was having regular bowel movements.  His girlfriend took him to his PCP this morning and they did some lab work and due to elevated ammonia levels was asked to come to ED for further evaluation.   On presentation hemodynamically stable, increased somnolence, labs with sodium of 138, chloride 95, BUN 43, creatinine 1.52, albumin 3.3, AST 47, T. bili 1.6, hemoglobin 10.9 which is better than his baseline, no leukocytosis, ammonia 86, INR 1.3 which is also at baseline, lipase normal.   1/25: Vital stable, ultrasound ascites was without any significant fluid collection so no paracentesis was done.  Worsening renal function with creatinine at 1.75 Mentation at baseline.  Patient had 3 soft bowel movements  since in the hospital and feeling much improved.  UA was also checked and it was negative for UTI.  Patient clinically appears dry so he was instructed to hold his Lasix for next few days and then restart at a lower dose of 40 mg daily. He was also instructed to keep himself well-hydrated and need to have a close follow-up with PCP and have his kidney functions repeated. Renal ultrasound was without any significant abnormality.  We increased the home dose of lactulose to 45 g twice daily, does need to be titrated to have 2-3 soft bowel movements daily.  Liver function and INR at baseline.  Patient is also having difficulty with starting urination, postvoid bladder volume was normal, Flomax allergy is shown in his chart, he need to follow-up with a urologist for further recommendations as likely having BPH.  Patient wants to go home as he thinks that he is at his baseline.  He is being discharged to continue his home medications except the Lasix as mentioned above and follow-up with his providers closely for further recommendations.   Assessment and Plan: * Hepatic encephalopathy (HCC) Patient with worsening confusion and somnolence, taking his lactulose but not sure whether he was having regular bowel movements. Liver enzymes relatively stable.  Ammonia elevated at 84 Paracentesis was ordered by EDP, but ultrasound did not show any significant amount of ascites so paracentesis was not done. With increased dose of lactulose he had 3 soft bowel movements, mentation improved back to baseline.  AKI (acute kidney injury) (HCC) Creatinine at 1.71, likely due to decreased p.o. intake and continue to take Lasix.  Clinically appears dry.  Not  much ascites. -Holding home Lasix and spironolactone during hospitalization, he was instructed to restart Lasix in next few days at a lower dose of 40 mg daily.  He need to have his levels repeated by PCP in next few days.  Renal ultrasound was negative for any acute  abnormality   COPD (chronic obstructive pulmonary disease) (HCC) No concern of exacerbation. -Continue home bronchodilators  Hypertension Blood currently within goal.  Patient takes Lasix and spironolactone at home. -Holding diuretic today due to concern of AKI -Continue to monitor  Hypomagnesemia -Continue home magnesium supplement  Hyponatremia Mild hyponatremia with sodium at 130, seems chronic, likely due to underlying liver cirrhosis. -Giving some normal saline -Monitor sodium   Pain control - [place] Controlled Substance Reporting System database was reviewed. and patient was instructed, not to drive, operate heavy machinery, perform activities at heights, swimming or participation in water activities or provide baby-sitting services while on Pain, Sleep and Anxiety Medications; until their outpatient Physician has advised to do so again. Also recommended to not to take more than prescribed Pain, Sleep and Anxiety Medications.  Consultants: None Procedures performed: None Disposition: Home Diet recommendation:  Discharge Diet Orders (From admission, onward)     Start     Ordered   07/15/23 0000  Diet - low sodium heart healthy        07/15/23 1345           Regular diet DISCHARGE MEDICATION: Allergies as of 07/15/2023       Reactions   Alfuzosin Other (See Comments)   Dizziness    Amlodipine Nausea And Vomiting   Bee Venom Swelling   Cymbalta [duloxetine Hcl] Other (See Comments)   Emesis   Duloxetine Other (See Comments)   Emesis   Lisinopril    Flomax [tamsulosin Hcl] Itching        Medication List     STOP taking these medications    LIVER SUPPORT SL       TAKE these medications    albuterol (2.5 MG/3ML) 0.083% nebulizer solution Commonly known as: PROVENTIL Take 3 mLs (2.5 mg total) by nebulization every 4 (four) hours as needed for wheezing or shortness of breath.   albuterol 108 (90 Base) MCG/ACT inhaler Commonly known as:  Ventolin HFA Inhale 2 puffs into the lungs every 4 (four) hours as needed.   budesonide-formoterol 80-4.5 MCG/ACT inhaler Commonly known as: Symbicort TAKE 2 PUFFS FIRST THING IN AM AND THEN ANOTHER 2 PUFFS ABOUT 12 HOURS LATER.   cefadroxil 500 MG capsule Commonly known as: DURICEF Take 500 mg by mouth 2 (two) times daily.   cholestyramine 4 g packet Commonly known as: QUESTRAN Take 1 packet (4 g total) by mouth 2 (two) times daily.   EPINEPHrine 0.3 mg/0.3 mL Soaj injection Commonly known as: EpiPen 2-Pak Inject 0.3 mg into the muscle as needed for anaphylaxis.   furosemide 40 MG tablet Commonly known as: LASIX Take 1 tablet (40 mg total) by mouth daily. What changed: how much to take   hydrOXYzine 25 MG capsule Commonly known as: VISTARIL Take 1 capsule (25 mg total) by mouth every 8 (eight) hours as needed for itching or anxiety.   lactulose 10 GM/15ML solution Commonly known as: CHRONULAC Take 67.5 mLs (45 g total) by mouth 2 (two) times daily. What changed: how much to take   magnesium oxide 400 (240 Mg) MG tablet Commonly known as: MAG-OX Take 2 tablets by mouth 2 (two) times daily.   multivitamin capsule Take  1 capsule by mouth daily.   ondansetron 4 MG disintegrating tablet Commonly known as: ZOFRAN-ODT Take 1 tablet (4 mg total) by mouth every 8 (eight) hours as needed for nausea or vomiting.   oxyCODONE 15 MG immediate release tablet Commonly known as: ROXICODONE Take 1 tablet (15 mg total) by mouth every 4 (four) hours as needed for pain.   pantoprazole 40 MG tablet Commonly known as: Protonix Take 1 tablet (40 mg total) by mouth daily before breakfast.   rifaximin 550 MG Tabs tablet Commonly known as: XIFAXAN Take 550 mg by mouth 2 (two) times daily.   spironolactone 50 MG tablet Commonly known as: ALDACTONE Take 150 mg by mouth daily.   thiamine 100 MG tablet Commonly known as: VITAMIN B1 Take 1 tablet (100 mg total) by mouth daily.         Follow-up Information     [name], DO. Schedule an appointment as soon as possible for a visit in 3 day(s).   Specialty: Family Medicine Contact information: [address] [place] [address] [phone]                Discharge Exam: Ceasar Mons Weights   07/14/23 1037  Weight: 90.7 kg   General.  Moderately malnourished gentleman, in no acute distress. Pulmonary.  Lungs clear bilaterally, normal respiratory effort. CV.  Regular rate and rhythm, no JVD, rub or murmur. Abdomen.  Soft, nontender, nondistended, BS positive. CNS.  Alert and oriented .  No focal neurologic deficit. Extremities.  No edema, no cyanosis, pulses intact and symmetrical. Psychiatry.  Judgment and insight appears normal.   Condition at discharge: stable  The results of significant diagnostics from this hospitalization (including imaging, microbiology, ancillary and laboratory) are listed below for reference.   Imaging Studies: US RENAL Result Date: 07/15/2023 CLINICAL DATA:  Acute renal failure EXAM: RENAL / URINARY TRACT ULTRASOUND COMPLETE COMPARISON:  Ultrasound abdomen 07/14/2023 FINDINGS: Right Kidney: Renal measurements: 10.2 x 5.3 x 5.6 cm = volume: 156.1 mL. Echogenicity within normal limits. No mass or hydronephrosis visualized. Left Kidney: Renal measurements: 11.8 x 4.8 x 5.1 cm = volume: 151.5 mL. Echogenicity within normal limits. No mass or hydronephrosis visualized. Bladder: Appears normal for degree of bladder distention. Other: None. IMPRESSION: No hydronephrosis. Electronically Signed   By: Annia Belt M.D.   On: 07/15/2023 11:39   [place] ASCITES (ABDOMEN LIMITED) Result Date: 07/14/2023 INDICATION: Patient is a 58 y/o male with history of alcoholic cirrhosis with recurrent ascites. Patient presents for a paracentesis due to abdominal distension. EXAM: ULTRASOUND ABDOMEN LIMITED FINDINGS: Ultrasound imaging of all 4 quadrants of the abdomen reveal trace ascites After  discussion of the risks versus benefits of the procedure the patient decided to defer paracentesis at this time. IMPRESSION: Trace intra-abdominal ascites. Paracentesis was NOT performed. Interpreted by Philipp Ovens PA-C Electronically Signed   By: Roanna Banning M.D.   On: 07/14/2023 14:44    Microbiology: Results for orders placed or performed during the hospital encounter of 03/15/23  Body fluid culture w Gram Stain     Status: None   Collection Time: 03/15/23  9:52 AM   Specimen: Peritoneal Washings; Body Fluid  Result Value Ref Range Status   Specimen Description   Final    PERITONEAL Performed at [facility], [address], [place], [place] [address]    Special Requests   Final    PERITONEAL Performed at [facility], [address] [place], [place] [address]  Gram Stain NO WBC SEEN NO ORGANISMS SEEN   Final   Culture   Final    NO GROWTH 3 DAYS Performed at [facility], [address] [place], [place] [address]    Report Status 03/19/2023 FINAL  Final    Labs: CBC: Recent Labs  Lab 07/14/23 1040 07/15/23 0312  WBC 6.3 7.3  HGB 10.9* 9.8*  HCT 33.3* 30.6*  MCV 82.2 82.5  PLT 402* 377   Basic Metabolic Panel: Recent Labs  Lab 07/14/23 1040 07/15/23 0312  NA 130* 129*  K 3.8 4.2  CL 95* 95*  CO2 22 22  GLUCOSE 127* 104*  BUN 43* 41*  CREATININE 1.52* 1.75*  CALCIUM 10.1 9.6   Liver Function Tests: Recent Labs  Lab 07/14/23 1040 07/15/23 0312  AST 47* 41  ALT 27 25  ALKPHOS 83 82  BILITOT 1.6* 1.3*  PROT 8.0 7.5  ALBUMIN 3.3* 3.0*   CBG: No results for input(s): "GLUCAP" in the last 168 hours.  Discharge time spent: greater than 30 minutes.  This record has been created using Conservation officer, historic buildings. Errors have been sought and corrected,but may not always be located. Such creation errors do not reflect on the standard of care.   Signed: Arnetha Courser, MD [facility] 07/15/2023

## 2023-07-15 NOTE — ED Notes (Signed)
Patient was not able to use restroom. Patient stated "I need my water pill." Patient now has a urinal and trying to urinate again now.

## 2023-07-15 NOTE — Hospital Course (Addendum)
Tom [name] is a 58 y.o. male with medical history significant of alcoholic liver cirrhosis, hypertension and COPD was sent to ED from his PCP for concern of elevated ammonia level.   Patient was having increased confusion and somnolence since Tuesday.  Apparently compliant with his medications but unable to tell whether he was having regular bowel movements.  His girlfriend took him to his PCP this morning and they did some lab work and due to elevated ammonia levels was asked to come to ED for further evaluation.   On presentation hemodynamically stable, increased somnolence, labs with sodium of 138, chloride 95, BUN 43, creatinine 1.52, albumin 3.3, AST 47, T. bili 1.6, hemoglobin 10.9 which is better than his baseline, no leukocytosis, ammonia 86, INR 1.3 which is also at baseline, lipase normal.   1/25: Vital stable, ultrasound ascites was without any significant fluid collection so no paracentesis was done.  Worsening renal function with creatinine at 1.75 Mentation at baseline.  Patient had 3 soft bowel movements since in the hospital and feeling much improved.  UA was also checked and it was negative for UTI.  Patient clinically appears dry so he was instructed to hold his Lasix for next few days and then restart at a lower dose of 40 mg daily. He was also instructed to keep himself well-hydrated and need to have a close follow-up with PCP and have his kidney functions repeated. Renal ultrasound was without any significant abnormality.  We increased the home dose of lactulose to 45 g twice daily, does need to be titrated to have 2-3 soft bowel movements daily.  Liver function and INR at baseline.  Patient is also having difficulty with starting urination, postvoid bladder volume was normal, Flomax allergy is shown in his chart, he need to follow-up with a urologist for further recommendations as likely having BPH.  Patient wants to go home as he thinks that he is at his baseline.   He is being discharged to continue his home medications except the Lasix as mentioned above and follow-up with his providers closely for further recommendations.

## 2023-07-15 NOTE — ED Notes (Signed)
This Registered Nurse (RN) assumed responsibility for the care of the assigned patient at 2301 on 07/14/23. All nursing tasks, documentation, and responsibilities prior to this time are not the responsibility of this RN. Any assessments, interventions, or documentation completed before this time are not within the scope of this RN's duties.  Effective 2301 on 07/14/23, this RN is now accountable for all aspects of patient care, including but not limited to assessments, interventions, documentation, medication administration, and care coordination. All future tasks and documentation will be managed and completed by this RN from this time until care handoff at 0700 on 07/15/23.

## 2023-07-15 NOTE — ED Notes (Signed)
The patient's IV has been dislodged and is currently not in place. The patient expressed a preference not to have a new IV placed at this time, citing frequent bathroom visits every 15 minutes as a concern. The provider was notified of the situation and indicated that the patient can remain without an IV for now. The provider also advised that day shift can reassess the need for a new IV placement. Patient currently calm and cooperative and agreeable to plan to remain without an IV for now. At present, pt is drinking PO fluids and taking medications by mouth as ordered. No other issues or concerns noted at this time.

## 2023-07-17 DIAGNOSIS — R600 Localized edema: Secondary | ICD-10-CM | POA: Diagnosis not present

## 2023-07-17 DIAGNOSIS — Z981 Arthrodesis status: Secondary | ICD-10-CM | POA: Diagnosis not present

## 2023-07-17 DIAGNOSIS — E43 Unspecified severe protein-calorie malnutrition: Secondary | ICD-10-CM | POA: Diagnosis not present

## 2023-07-20 ENCOUNTER — Other Ambulatory Visit: Payer: Self-pay | Admitting: Family Medicine

## 2023-07-20 DIAGNOSIS — L299 Pruritus, unspecified: Secondary | ICD-10-CM

## 2023-07-21 NOTE — Telephone Encounter (Signed)
Requested Prescriptions  Pending Prescriptions Disp Refills   hydrOXYzine (VISTARIL) 25 MG capsule [Pharmacy Med Name: HYDROXYZINE PAM 25 MG CAP] 270 capsule 0    Sig: TAKE 1 CAPSULE (25 MG TOTAL) BY MOUTH EVERY 8 (EIGHT) HOURS AS NEEDED FOR ITCHING OR ANXIETY.     Ear, Nose, and Throat:  Antihistamines 2 Failed - 07/21/2023 10:59 AM      Failed - Cr in normal range and within 360 days    Creat  Date Value Ref Range Status  06/27/2023 1.20 0.70 - 1.30 mg/dL Final   Creatinine, Ser  Date Value Ref Range Status  07/15/2023 1.75 (H) 0.61 - 1.24 mg/dL Final         Passed - Valid encounter within last 12 months    Recent Outpatient Visits           1 week ago Hepatic encephalopathy [facility])   [place] [facility] [place], [name], DO   3 weeks ago Alcoholic cirrhosis of liver with ascites [facility])   [place] [facility] [name], DO   2 months ago Painful orthopaedic hardware [facility])   [place] [facility] [name], DO   3 months ago Painful orthopaedic hardware [facility])   [place] [facility] [name], DO   3 months ago Alcoholic cirrhosis of liver with ascites [facility])   [place] [facility] Althea Charon, [name], DO       Future Appointments             In 4 days Althea Charon, [name], DO [place] [facility]

## 2023-07-25 ENCOUNTER — Telehealth: Payer: Medicaid Other | Admitting: Family Medicine

## 2023-07-25 ENCOUNTER — Encounter: Payer: Self-pay | Admitting: Family Medicine

## 2023-07-25 ENCOUNTER — Other Ambulatory Visit: Payer: Self-pay | Admitting: *Deleted

## 2023-07-25 DIAGNOSIS — K7031 Alcoholic cirrhosis of liver with ascites: Secondary | ICD-10-CM

## 2023-07-25 DIAGNOSIS — Z515 Encounter for palliative care: Secondary | ICD-10-CM | POA: Diagnosis not present

## 2023-07-25 DIAGNOSIS — T8484XA Pain due to internal orthopedic prosthetic devices, implants and grafts, initial encounter: Secondary | ICD-10-CM

## 2023-07-25 MED ORDER — OXYCODONE HCL 15 MG PO TABS: 15.0000 mg | ORAL_TABLET | ORAL | 0 refills | Status: DC | PRN

## 2023-07-25 NOTE — Progress Notes (Signed)
 Subjective:    Patient ID: Tom [name], male    DOB: 1965/08/23, 58 y.o.   MRN: [id number]  Tom [name] is a 58 y.o. male presenting on 07/25/2023 for Foot Pain and Cirrhosis   Virtual / Telehealth Encounter - Video Visit via MyChart The purpose of this virtual visit is to provide medical care while limiting exposure to the novel coronavirus (COVID19) for both patient and office staff.  Consent was obtained for remote visit:  Yes.   Answered questions that patient had about telehealth interaction:  Yes.   I discussed the limitations, risks, security and privacy concerns of performing an evaluation and management service by video/telephone. I also discussed with the patient that there may be a patient responsible charge related to this service. The patient expressed understanding and agreed to proceed.  Patient Location: Home Provider Location: [facility] (Office)  Participants in virtual visit: - Patient: Tom [name] and Sister. - CMA: Alan Fontana CMA - Provider: Dr Edman   HPI  Discussed the use of AI scribe software for clinical note transcription with the patient, who gave verbal consent to proceed.  History of Present Illness    Tom [name] is a 58 year old male with cirrhosis of the liver who presents for follow-up on liver and foot issues. He is accompanied by his sister.  He has cirrhosis of the liver and recently consulted with his [facility] liver specialist who noted stable liver enzyme levels but a decrease in kidney function. He is awaiting further discussions between the liver specialist and interventional radiology regarding a potential interventional radiology procedure to place a shunt to help alleviate symptoms such as confusion due to ammonia buildup. As he has had hospitalization recently for ascites and hepatic encephalopathy  Chronic Foot Pain. He is experiencing issues with orthopedic screws  in his foot, which are reportedly 'falling out again.' He is scheduled for a preoperative appointment in [facility] and subsequent surgery to remove the screws on February 13th. He hopes that removing the screws will reduce his pain, allowing him to decrease his pain medication from 15 mg to 5 or 10 mg. Currently, he takes one pill in the morning, one at lunchtime, and one at bedtime. He describes significant pain in his foot, which he says is large and swollen and notes that it prevents him from walking in the morning until about 30 to 40 minutes after waking.  He reports intermittent difficulty with urination, noting that it 'comes and goes.' He recently was able to urinate but acknowledges that pain medication may contribute to urinary retention. He recalls that his cirrhosis diagnosis was initially discovered during a hospital visit for urinary issues.          05/16/2023    5:40 PM 02/22/2023    2:40 PM 03/21/2022    2:56 PM  Depression screen PHQ 2/9  Decreased Interest 0 0 0  Down, Depressed, Hopeless 0 0 0  PHQ - 2 Score 0 0 0  Altered sleeping 0  0  Tired, decreased energy 0  0  Change in appetite 0  0  Feeling bad or failure about yourself  0  0  Trouble concentrating 0  0  Moving slowly or fidgety/restless 0  0  Suicidal thoughts 0  0  PHQ-9 Score 0  0  Difficult doing work/chores Not difficult at all  Not difficult at all       09/21/2022   11:26 AM 12/16/2020  3:16 PM  GAD 7 : Generalized Anxiety Score  Nervous, Anxious, on Edge 0 0  Control/stop worrying 0 0  Worry too much - different things 0 0  Trouble relaxing 0 0  Restless 0 0  Easily annoyed or irritable 0 0  Afraid - awful might happen 0 0  Total GAD 7 Score 0 0  Anxiety Difficulty Not difficult at all Not difficult at all    Social History   Tobacco Use   Smoking status: Former    Current packs/day: 0.00    Average packs/day: 1 pack/day for 39.0 years (39.0 ttl pk-yrs)    Types: Cigarettes    Start  date: 12/1976    Quit date: 12/2015    Years since quitting: 7.6   Smokeless tobacco: Never  Vaping Use   Vaping status: Never Used  Substance Use Topics   Alcohol use: No   Drug use: Yes    Types: Marijuana    Review of Systems Per HPI unless specifically indicated above     Objective:    There were no vitals taken for this visit.  Wt Readings from Last 3 Encounters:  07/14/23 200 lb (90.7 kg)  06/01/23 227 lb 6.4 oz (103.1 kg)  05/16/23 227 lb 6.4 oz (103.1 kg)     Physical Exam  Note examination was completely remotely via video observation objective data only  Gen - well-appearing, no acute distress, chronically ill, thin appearing, some discomfort with foot pain HEENT - eyes appear clear without discharge or redness Heart/Lungs - cannot examine virtually - observed no evidence of coughing or labored breathing. Abd - cannot examine virtually  Skin - face visible today- no rash Neuro - awake, alert, oriented Psych - not anxious appearing   Results for orders placed or performed during the hospital encounter of 07/14/23  Lipase, blood   Collection Time: 07/14/23 10:40 AM  Result Value Ref Range   Lipase 26 11 - 51 U/L  Comprehensive metabolic panel   Collection Time: 07/14/23 10:40 AM  Result Value Ref Range   Sodium 130 (L) 135 - 145 mmol/L   Potassium 3.8 3.5 - 5.1 mmol/L   Chloride 95 (L) 98 - 111 mmol/L   CO2 22 22 - 32 mmol/L   Glucose, Bld 127 (H) 70 - 99 mg/dL   BUN 43 (H) 6 - 20 mg/dL   Creatinine, Ser 8.47 (H) 0.61 - 1.24 mg/dL   Calcium  10.1 8.9 - 10.3 mg/dL   Total Protein 8.0 6.5 - 8.1 g/dL   Albumin  3.3 (L) 3.5 - 5.0 g/dL   AST 47 (H) 15 - 41 U/L   ALT 27 0 - 44 U/L   Alkaline Phosphatase 83 38 - 126 U/L   Total Bilirubin 1.6 (H) 0.0 - 1.2 mg/dL   GFR, Estimated 52 (L) >60 mL/min   Anion gap 13 5 - 15  CBC   Collection Time: 07/14/23 10:40 AM  Result Value Ref Range   WBC 6.3 4.0 - 10.5 K/uL   RBC 4.05 (L) 4.22 - 5.81 MIL/uL    Hemoglobin 10.9 (L) 13.0 - 17.0 g/dL   HCT 66.6 (L) 60.9 - 47.9 %   MCV 82.2 80.0 - 100.0 fL   MCH 26.9 26.0 - 34.0 pg   MCHC 32.7 30.0 - 36.0 g/dL   RDW 84.8 88.4 - 84.4 %   Platelets 402 (H) 150 - 400 K/uL   nRBC 0.0 0.0 - 0.2 %  Ammonia   Collection Time: 07/14/23  10:40 AM  Result Value Ref Range   Ammonia 84 (H) 9 - 35 umol/L  Protime-INR   Collection Time: 07/14/23 12:55 PM  Result Value Ref Range   Prothrombin Time 16.3 (H) 11.4 - 15.2 seconds   INR 1.3 (H) 0.8 - 1.2  Comprehensive metabolic panel   Collection Time: 07/15/23  3:12 AM  Result Value Ref Range   Sodium 129 (L) 135 - 145 mmol/L   Potassium 4.2 3.5 - 5.1 mmol/L   Chloride 95 (L) 98 - 111 mmol/L   CO2 22 22 - 32 mmol/L   Glucose, Bld 104 (H) 70 - 99 mg/dL   BUN 41 (H) 6 - 20 mg/dL   Creatinine, Ser 8.24 (H) 0.61 - 1.24 mg/dL   Calcium  9.6 8.9 - 10.3 mg/dL   Total Protein 7.5 6.5 - 8.1 g/dL   Albumin  3.0 (L) 3.5 - 5.0 g/dL   AST 41 15 - 41 U/L   ALT 25 0 - 44 U/L   Alkaline Phosphatase 82 38 - 126 U/L   Total Bilirubin 1.3 (H) 0.0 - 1.2 mg/dL   GFR, Estimated 44 (L) >60 mL/min   Anion gap 12 5 - 15  CBC   Collection Time: 07/15/23  3:12 AM  Result Value Ref Range   WBC 7.3 4.0 - 10.5 K/uL   RBC 3.71 (L) 4.22 - 5.81 MIL/uL   Hemoglobin 9.8 (L) 13.0 - 17.0 g/dL   HCT 69.3 (L) 60.9 - 47.9 %   MCV 82.5 80.0 - 100.0 fL   MCH 26.4 26.0 - 34.0 pg   MCHC 32.0 30.0 - 36.0 g/dL   RDW 84.8 88.4 - 84.4 %   Platelets 377 150 - 400 K/uL   nRBC 0.0 0.0 - 0.2 %  Protime-INR   Collection Time: 07/15/23  3:12 AM  Result Value Ref Range   Prothrombin Time 16.8 (H) 11.4 - 15.2 seconds   INR 1.3 (H) 0.8 - 1.2  Urinalysis, Complete w Microscopic -Urine, Clean Catch   Collection Time: 07/15/23 12:27 PM  Result Value Ref Range   Color, Urine YELLOW (A) YELLOW   APPearance CLEAR (A) CLEAR   Specific Gravity, Urine 1.014 1.005 - 1.030   pH 6.0 5.0 - 8.0   Glucose, UA NEGATIVE NEGATIVE mg/dL   Hgb urine dipstick  NEGATIVE NEGATIVE   Bilirubin Urine NEGATIVE NEGATIVE   Ketones, ur NEGATIVE NEGATIVE mg/dL   Protein, ur NEGATIVE NEGATIVE mg/dL   Nitrite NEGATIVE NEGATIVE   Leukocytes,Ua NEGATIVE NEGATIVE   RBC / HPF 0-5 0 - 5 RBC/hpf   WBC, UA 0 0 - 5 WBC/hpf   Bacteria, UA NONE SEEN NONE SEEN   Squamous Epithelial / HPF 0 0 - 5 /HPF      Assessment & Plan:   Problem List Items Addressed This Visit     Alcoholic cirrhosis of liver with ascites (HCC) - Primary   Other Visit Diagnoses       Painful orthopaedic hardware (HCC)       Relevant Medications   oxyCODONE  (ROXICODONE ) 15 MG immediate release tablet (Start on 07/26/2023)     Palliative care patient       Relevant Medications   oxyCODONE  (ROXICODONE ) 15 MG immediate release tablet (Start on 07/26/2023)         Alcoholic Liver Cirrhosis, ascites History of hepatic encephalopathy Followed by [facility] Discussed potential interventional radiology procedure for an intrahepatic shunt, goal to reduce ammonia and complications. -Continue to follow up with liver  specialist and interventional radiology for potential shunt procedure.  Orthopedic Pain / Hardware Foot Severe pain due to screws in foot, causing functional impairment. Plan for orthopedic surgery to remove screws per [facility] -Continue current pain management regimen oxycodone  (15mg  pills) until surgery. -Plan to reduce pain medication dosage post-surgery if pain improves.  Urinary Retention Intermittent difficulty urinating, potentially related to pain medication. -Monitor urinary symptoms closely. If unable to urinate for 6 hours or more with urgency, seek immediate medical attention.  Orthopedic Surgery Scheduled for 08/03/2023 to remove screws from foot. -Preoperative appointment scheduled prior to surgery date.  Pain Management Currently on 15mg  pain medication, taken three times daily. Plan to reduce dosage post-surgery if pain improves. -Refill pain medication  prescription to last until next appointment. -Check in 2 weeks post-surgery to assess pain levels and adjust medication as needed.         No orders of the defined types were placed in this encounter.   Meds ordered this encounter  Medications   oxyCODONE  (ROXICODONE ) 15 MG immediate release tablet    Sig: Take 1 tablet (15 mg total) by mouth every 4 (four) hours as needed for pain.    Dispense:  60 tablet    Refill:  0    First fill 07/26/23    Follow up plan: Return if symptoms worsen or fail to improve.   Patient verbalizes understanding with the above medical recommendations including the limitation of remote medical advice.  Specific follow-up and call-back criteria were given for patient to follow-up or seek medical care more urgently if needed.  Total duration of direct patient care provided via video conference: 17 minutes   [name], DO [facility] 07/25/2023, 11:51 AM

## 2023-07-25 NOTE — Patient Outreach (Signed)
 Medicaid Managed Care   Nurse Care Manager Note  07/25/2023 Name:  Tom [name] MRN:  [id number] DOB:  [birth year]  Tom [name] is an 58 y.o. year old male who is a primary patient of Tom Marsa PARAS, DO.  The [facility] Managed Care Coordination team was consulted for assistance with:    Pain Cirrhosis  Tom [name] was given information about Medicaid Managed Care Coordination team services today. Tom [name] Patient agreed to services and verbal consent obtained.  Engaged with patient by telephone for follow up visit in response to provider referral for case management and/or care coordination services.   Patient is participating in a Managed Medicaid Plan:  Yes  Assessments/Interventions:  Review of past medical history, allergies, medications, health status, including review of consultants reports, laboratory and other test data, was performed as part of comprehensive evaluation and provision of chronic care management services.  SDOH (Social Drivers of Health) assessments and interventions performed: SDOH Interventions    Flowsheet Row Patient Outreach Telephone from 06/23/2023 in [place] POPULATION HEALTH DEPARTMENT Patient Outreach Telephone from 05/12/2023 in [place] POPULATION HEALTH DEPARTMENT Telephone from 01/11/2023 in [place] POPULATION HEALTH DEPARTMENT ED to Hosp-Admission (Discharged) from 12/26/2022 in [facility] ED to Hosp-Admission (Discharged) from 12/15/2022 in [facility] Patient Outreach Telephone from 10/19/2022 in [facility] POPULATION HEALTH DEPARTMENT  SDOH Interventions        Food Insecurity Interventions Intervention Not Indicated Intervention Not Indicated -- -- -- --  Housing Interventions Intervention Not Indicated Intervention Not Indicated -- -- -- --  Transportation Interventions Intervention Not Indicated Intervention Not Indicated  Intervention Not Indicated Inpatient TOC, Other (Comment) Inpatient TOC --  Utilities Interventions -- Intervention Not Indicated -- -- -- Intervention Not Indicated       Care Plan  Allergies  Allergen Reactions   Alfuzosin  Other (See Comments)    Dizziness    Amlodipine Nausea And Vomiting   Bee Venom Swelling   Cymbalta [Duloxetine Hcl] Other (See Comments)    Emesis    Duloxetine Other (See Comments)    Emesis   Lisinopril    Flomax  [Tamsulosin  Hcl] Itching    Medications Reviewed Today   Medications were not reviewed in this encounter     Patient Active Problem List   Diagnosis Date Noted   Hepatic encephalopathy (HCC) 06/01/2023   Normocytic anemia 06/01/2023   RLS (restless legs syndrome) 06/01/2023   Testicle pain 06/01/2023   Abdominal pain 01/16/2023   Anxiety 01/16/2023   Chronic kidney disease, stage 3a (HCC) 01/16/2023   Cellulitis of right lower extremity 12/31/2022   Obesity (BMI 30-39.9) 12/27/2022   Cellulitis of right foot 12/26/2022   Hyponatremia 12/17/2022   End stage liver disease (HCC) 12/15/2022   Dyslipidemia 12/04/2022   COPD (chronic obstructive pulmonary disease) (HCC) 12/04/2022   GERD without esophagitis 12/04/2022   Abdominal distension 12/04/2022   Shortness of breath 12/04/2022   Hypomagnesemia 12/04/2022   Ascites of liver 12/03/2022   Right foot pain 11/15/2022   AKI (acute kidney injury) (HCC) 11/15/2022   Elevated lactic acid level 11/15/2022   Complication associated with orthopedic device (HCC) 10/21/2022   Alcoholic cirrhosis of liver with ascites (HCC) 07/04/2022   Former smoker 09/16/2021   Asthmatic bronchitis , chronic (HCC) 09/16/2021   Pre-diabetes 08/13/2021   Morbid obesity due to excess calories (HCC) 08/13/2021   DOE (dyspnea on exertion) 04/13/2021   Acute pain of left wrist  03/25/2021   DDD (degenerative disc disease), lumbar 12/16/2020   Chronic back pain 12/16/2020   Arthritis of right ankle 05/08/2017    Atherosclerosis of native coronary artery of native heart without angina pectoris 01/06/2015   Hypertension 10/03/2012   Osteoarthritis 12/06/2011    Conditions to be addressed/monitored per PCP order:   Pain and Cirrhosis  Care Plan : RN Care Manager Plan of Care  Updates made by Tom Andrea LABOR, RN since 07/25/2023 12:00 AM     Problem: Health Managment needs related to Liver Cirrhosis      Long-Range Goal: Development of Plan of Care to address Health Managment needs related to Liver Cirrhosis   Start Date: 02/01/2023  Expected End Date: 08/18/2023  Note:   Current Barriers:  Knowledge Deficits related to plan of care for management of Liver Cirrohsis Tom [name] continues to have right ankle pain. Seen in the ED for lethargy and confusion. He was very clear today and requested RNCM talk with his sister, Tom [name].   RNCM Clinical Goal(s):  Patient will verbalize understanding of plan for management of Liver Cirrhosis as evidenced by patient reports attend all scheduled medical appointments: 06/27/23 with PCP as evidenced by provider documentation in EMR        continue to work with RN Care Manager and/or Social Worker to address care management and care coordination needs related to Liver Cirrhosis as evidenced by adherence to CM Team Scheduled appointments     through collaboration with RN Care manager, provider, and care team.   Interventions: Evaluation of current treatment plan related to  self management and patient's adherence to plan as established by provider Collaboration with SW regarding ACP-family will contact Tom [name] today   Pain Interventions:  (Status:  New goal.) Long Term Goal Pain assessment performed Medications reviewed Discussed importance of adherence to all scheduled medical appointments Advised patient to report to care team affect of pain on daily activities Discussed upcoming surgery to remove screws on 08/03/23 Advised family to discuss discharge needs  with surgeon  Liver Cirrhosis  (Status: Goal on Track (progressing): YES.) Long Term Goal  Evaluation of current treatment plan related to  Liver Cirrhosis ,  self-management and patient's adherence to plan as established by provider. Discussed plans with patient for ongoing care management follow up and provided patient with direct contact information for care management team Advised patient to keep a calendar of visits, providers and contact information; Reviewed medications with patient and discussed the importance of taking lactulose  and having bowel movement every day; Assessed social determinant of health barriers;  Advised patient to discuss any concerns or questions regarding liver transplant with Hepatology-discussed sending MyChart message is the best way to communicate  Discussed the involvement of Palliative Care, patient felt services not needed at this time-revisited Provided therapeutic listening Discussed authorization is needed with [facility] and [facility] to access health information, advised patient to discuss at next visit Discussed the importance of having 2-3 bowel movements daily Discussed Hepatology visit on 07/24/23, possible shunt repair-patient and family to consider   Patient Goals/Self-Care Activities: Take medications as prescribed   Attend all scheduled provider appointments Call provider office for new concerns or questions        Follow Up:  Patient agrees to Care Plan and Follow-up.  Plan: The Managed Medicaid care management team will reach out to the patient again over the next 30 days.  Date/time of next scheduled RN care management/care coordination outreach:  08/08/23 at 10:30  Andrea Lucky  RN, BSN [employer]  Value-Based Care [facility] RN Care Manager [phone]

## 2023-07-25 NOTE — Patient Instructions (Signed)
Visit Information  Mr. Bee was given information about Medicaid Managed Care team care coordination services as a part of their [employer] Medicaid benefit. Tom [name] verbally consented to engagement with the [facility] Managed Care team.   If you are experiencing a medical emergency, please call 911 or report to your local emergency department or urgent care.   If you have a non-emergency medical problem during routine business hours, please contact your provider's office and ask to speak with a nurse.   For questions related to your Healthy [facility] health plan, please call: [phone] or visit the homepage here: [email or web address]  If you would like to schedule transportation through your [facility] plan, please call the following number at least 2 days in advance of your appointment: [phone]  For information about your ride after you set it up, call Ride Assist at [phone]. Use this number to activate a Will Call pickup, or if your transportation is late for a scheduled pickup. Use this number, too, if you need to make a change or cancel a previously scheduled reservation.  If you need transportation services right away, call [phone]. The after-hours call center is staffed 24 hours to handle ride assistance and urgent reservation requests (including discharges) 365 days a year. Urgent trips include sick visits, hospital discharge requests and life-sustaining treatment.  Call the [facility] Line at [phone], at any time, 24 hours a day, 7 days a week. If you are in danger or need immediate medical attention call 911.  If you would like help to quit smoking, call 1-800-QUIT-NOW (825-488-2021) OR Espaol: 1-855-Djelo-Ya (2-595-638-7564) o para ms informacin haga clic aqu or Text READY to 332-951 to register via text  Tom [name],   Please see education materials related to  pain provided by MyChart link.  Patient verbalizes understanding of instructions and care plan provided today and agrees to view in MyChart. Active MyChart status and patient understanding of how to access instructions and care plan via MyChart confirmed with patient.     Telephone follow up appointment with Managed Medicaid care management team member scheduled for:08/08/23 at 10:30am  Tom Emms RN, BSN [place]  Value-Based Care [facility] RN Care Manager [phone]   Following is a copy of your plan of care:  Care Plan : RN Care Manager Plan of Care  Updates made by [name], RN since 07/25/2023 12:00 AM     Problem: Health Managment needs related to Liver Cirrhosis      Long-Range Goal: Development of Plan of Care to address Health Managment needs related to Liver Cirrhosis   Start Date: 02/01/2023  Expected End Date: 08/18/2023  Note:   Current Barriers:  Knowledge Deficits related to plan of care for management of Liver Cirrohsis Mr. Wigen continues to have right ankle pain. Seen in the ED for lethargy and confusion. He was very clear today and requested RNCM talk with his sister, Tom [name].   RNCM Clinical Goal(s):  Patient will verbalize understanding of plan for management of Liver Cirrhosis as evidenced by patient reports attend all scheduled medical appointments: 06/27/23 with PCP as evidenced by provider documentation in EMR        continue to work with RN Care Manager and/or Social Worker to address care management and care coordination needs related to Liver Cirrhosis as evidenced by adherence to CM Team Scheduled appointments     through collaboration with RN Care manager, provider, and care team.   Interventions: Evaluation  of current treatment plan related to  self management and patient's adherence to plan as established by provider Collaboration with SW regarding ACP-family will contact Tom [name] today   Pain Interventions:  (Status:   New goal.) Long Term Goal Pain assessment performed Medications reviewed Discussed importance of adherence to all scheduled medical appointments Advised patient to report to care team affect of pain on daily activities Discussed upcoming surgery to remove screws on 08/03/23 Advised family to discuss discharge needs with surgeon  Liver Cirrhosis  (Status: Goal on Track (progressing): YES.) Long Term Goal  Evaluation of current treatment plan related to  Liver Cirrhosis ,  self-management and patient's adherence to plan as established by provider. Discussed plans with patient for ongoing care management follow up and provided patient with direct contact information for care management team Advised patient to keep a calendar of visits, providers and contact information; Reviewed medications with patient and discussed the importance of taking lactulose and having bowel movement every day; Assessed social determinant of health barriers;  Advised patient to discuss any concerns or questions regarding liver transplant with Hepatology-discussed sending MyChart message is the best way to communicate  Discussed the involvement of Palliative Care, patient felt services not needed at this time-revisited Provided therapeutic listening Discussed authorization is needed with [facility] and [facility] to access health information, advised patient to discuss at next visit Discussed the importance of having 2-3 bowel movements daily Discussed Hepatology visit on 07/24/23, possible shunt repair-patient and family to consider   Patient Goals/Self-Care Activities: Take medications as prescribed   Attend all scheduled provider appointments Call provider office for new concerns or questions

## 2023-07-25 NOTE — Patient Instructions (Addendum)
 Thank you for coming to the office today.  Re order oxycodone  for now pick up 07/26/23  Keep up with [facility]   Please schedule a Follow-up Appointment to: Return if symptoms worsen or fail to improve.  If you have any other questions or concerns, please feel free to call the office or send a message through MyChart. You may also schedule an earlier appointment if necessary.  Additionally, you may be receiving a survey about your experience at our office within a few days to 1 week by e-mail or mail. We value your feedback.  [name], DO [facility], [place]

## 2023-07-27 ENCOUNTER — Telehealth: Payer: Self-pay

## 2023-07-27 NOTE — Telephone Encounter (Signed)
 Please let him know I cannot order more pain medicine.  I saw him on video visit 07/25/23. I re ordered his Oxycodone  15mg  for fill on 07/26/23 (yesterday). It shows he picked it up for 60 tablets.  I cannot re order any more pain medicine at this time. He has to follow normal refill protocol approximately 10-14 days would be soonest for re order.  [name], DO [facility] 07/27/2023, 1:21 PM

## 2023-07-27 NOTE — Telephone Encounter (Signed)
 Copied from CRM [id number]. Topic: General - Inquiry >> Jul 27, 2023 12:23 PM Tom [name] wrote: Reason for CRM: Pt called with pain in right foot and will be having surgery and is asking for more pain medication to get him through until the surgery.  CB#  [phone]

## 2023-07-27 NOTE — Telephone Encounter (Signed)
 Spoke to patient advised to only take 1 tablet every 4 hours. 2 is too high of a dose. Verbal understanding.

## 2023-07-27 NOTE — Telephone Encounter (Signed)
 The current dose is 15mg  per pill. He should not take 15mg  x 2 = 30mg  per dose every 4 hours. That is extremely high and I do not recommend taking that high dose. I cannot authorize him to take that dose.  [name], DO [facility] 07/27/2023, 1:56 PM

## 2023-08-01 ENCOUNTER — Telehealth: Payer: Self-pay | Admitting: Family Medicine

## 2023-08-01 DIAGNOSIS — R188 Other ascites: Secondary | ICD-10-CM | POA: Diagnosis not present

## 2023-08-01 DIAGNOSIS — K766 Portal hypertension: Secondary | ICD-10-CM | POA: Diagnosis not present

## 2023-08-01 DIAGNOSIS — K7469 Other cirrhosis of liver: Secondary | ICD-10-CM | POA: Diagnosis not present

## 2023-08-01 NOTE — Telephone Encounter (Signed)
Pt. Calling to ask to increase pain medications again. Given PCP message from 07/27/23. Verbalizes understanding.

## 2023-08-02 DIAGNOSIS — Z79899 Other long term (current) drug therapy: Secondary | ICD-10-CM | POA: Diagnosis not present

## 2023-08-02 DIAGNOSIS — D71 Functional disorders of polymorphonuclear neutrophils: Secondary | ICD-10-CM | POA: Diagnosis not present

## 2023-08-02 DIAGNOSIS — Z472 Encounter for removal of internal fixation device: Secondary | ICD-10-CM | POA: Diagnosis not present

## 2023-08-02 DIAGNOSIS — Z7982 Long term (current) use of aspirin: Secondary | ICD-10-CM | POA: Diagnosis not present

## 2023-08-02 DIAGNOSIS — E871 Hypo-osmolality and hyponatremia: Secondary | ICD-10-CM | POA: Diagnosis not present

## 2023-08-02 DIAGNOSIS — S99921A Unspecified injury of right foot, initial encounter: Secondary | ICD-10-CM | POA: Diagnosis not present

## 2023-08-02 DIAGNOSIS — K7460 Unspecified cirrhosis of liver: Secondary | ICD-10-CM | POA: Diagnosis not present

## 2023-08-02 DIAGNOSIS — Z01818 Encounter for other preprocedural examination: Secondary | ICD-10-CM | POA: Diagnosis not present

## 2023-08-02 DIAGNOSIS — Z5329 Procedure and treatment not carried out because of patient's decision for other reasons: Secondary | ICD-10-CM | POA: Diagnosis not present

## 2023-08-02 DIAGNOSIS — J449 Chronic obstructive pulmonary disease, unspecified: Secondary | ICD-10-CM | POA: Diagnosis not present

## 2023-08-02 DIAGNOSIS — R059 Cough, unspecified: Secondary | ICD-10-CM | POA: Diagnosis not present

## 2023-08-02 DIAGNOSIS — I10 Essential (primary) hypertension: Secondary | ICD-10-CM | POA: Diagnosis not present

## 2023-08-02 DIAGNOSIS — D649 Anemia, unspecified: Secondary | ICD-10-CM | POA: Diagnosis not present

## 2023-08-02 DIAGNOSIS — N179 Acute kidney failure, unspecified: Secondary | ICD-10-CM | POA: Diagnosis not present

## 2023-08-02 DIAGNOSIS — Z87891 Personal history of nicotine dependence: Secondary | ICD-10-CM | POA: Diagnosis not present

## 2023-08-02 DIAGNOSIS — M85871 Other specified disorders of bone density and structure, right ankle and foot: Secondary | ICD-10-CM | POA: Diagnosis not present

## 2023-08-03 ENCOUNTER — Telehealth: Payer: Self-pay

## 2023-08-03 DIAGNOSIS — R600 Localized edema: Secondary | ICD-10-CM | POA: Diagnosis not present

## 2023-08-03 DIAGNOSIS — I2510 Atherosclerotic heart disease of native coronary artery without angina pectoris: Secondary | ICD-10-CM | POA: Diagnosis not present

## 2023-08-03 DIAGNOSIS — T8484XA Pain due to internal orthopedic prosthetic devices, implants and grafts, initial encounter: Secondary | ICD-10-CM | POA: Diagnosis not present

## 2023-08-03 DIAGNOSIS — I129 Hypertensive chronic kidney disease with stage 1 through stage 4 chronic kidney disease, or unspecified chronic kidney disease: Secondary | ICD-10-CM | POA: Diagnosis not present

## 2023-08-03 DIAGNOSIS — Z981 Arthrodesis status: Secondary | ICD-10-CM | POA: Diagnosis not present

## 2023-08-03 DIAGNOSIS — N189 Chronic kidney disease, unspecified: Secondary | ICD-10-CM | POA: Diagnosis not present

## 2023-08-03 HISTORY — PX: ANKLE HARDWARE REMOVAL: SHX1149

## 2023-08-03 NOTE — Transitions of Care (Post Inpatient/ED Visit) (Signed)
   08/03/2023  Name: Tom [name] [place] MRN: [id number] DOB: March 05, 1966  Today's TOC FU Call Status: Today's TOC FU Call Status:: Unsuccessful Call (1st Attempt) Unsuccessful Call (1st Attempt) Date: 08/03/23  Attempted to reach the patient regarding the most recent Inpatient/ED visit.  Follow Up Plan: Additional outreach attempts will be made to reach the patient to complete the Transitions of Care (Post Inpatient/ED visit) call.   Signature Karena Addison, LPN [facility] Nurse Health Advisor Direct Dial [phone]

## 2023-08-06 ENCOUNTER — Other Ambulatory Visit: Payer: Self-pay | Admitting: Family Medicine

## 2023-08-06 DIAGNOSIS — T8484XA Pain due to internal orthopedic prosthetic devices, implants and grafts, initial encounter: Secondary | ICD-10-CM

## 2023-08-06 DIAGNOSIS — Z515 Encounter for palliative care: Secondary | ICD-10-CM

## 2023-08-07 ENCOUNTER — Other Ambulatory Visit: Payer: Self-pay | Admitting: Family Medicine

## 2023-08-07 ENCOUNTER — Ambulatory Visit: Payer: Self-pay

## 2023-08-07 DIAGNOSIS — T8484XA Pain due to internal orthopedic prosthetic devices, implants and grafts, initial encounter: Secondary | ICD-10-CM

## 2023-08-07 DIAGNOSIS — Z515 Encounter for palliative care: Secondary | ICD-10-CM

## 2023-08-07 MED ORDER — OXYCODONE HCL 15 MG PO TABS: 15.0000 mg | ORAL_TABLET | ORAL | 0 refills | Status: DC | PRN

## 2023-08-07 NOTE — Transitions of Care (Post Inpatient/ED Visit) (Signed)
 08/07/2023  Name: Tom [name] MRN: [id number] DOB: 02/17/1966  Today's TOC FU Call Status: Today's TOC FU Call Status:: Successful TOC FU Call Completed Unsuccessful Call (1st Attempt) Date: 08/03/23 [facility] FU Call Complete Date: 08/07/23 Patient's Name and Date of Birth confirmed.  Transition Care Management Follow-up Telephone Call Date of Discharge: 08/02/23 Discharge Facility: Other [facility]) Name of Other (Non[facility]) Discharge Facility: [facility] Type of Discharge: Inpatient Admission Primary Inpatient Discharge Diagnosis:: hardware removal How have you been since you were released from the hospital?: Better Any questions or concerns?: No  Items Reviewed: Did you receive and understand the discharge instructions provided?: Yes Any new allergies since your discharge?: No Dietary orders reviewed?: Yes Do you have support at home?: Yes People in Home: spouse  Medications Reviewed Today: Medications Reviewed Today     Reviewed by Karena Addison, LPN (Licensed Practical Nurse) on 08/07/23 at 1613  Med List Status: <None>   Medication Order Taking? Sig Documenting Provider Last Dose Status Informant  albuterol (PROVENTIL) (2.5 MG/3ML) 0.083% nebulizer solution [id number] No Take 3 mLs (2.5 mg total) by nebulization every 4 (four) hours as needed for wheezing or shortness of breath. [name], MD Taking Active Spouse/Significant Other           Med Note Sharia Reeve   Fri Jul 14, 2023  2:17 PM) prn  albuterol (VENTOLIN HFA) 108 (90 Base) MCG/ACT inhaler [id number] No Inhale 2 puffs into the lungs every 4 (four) hours as needed. [name], DO 07/13/2023 Active Spouse/Significant Other  budesonide-formoterol (SYMBICORT) 80-4.5 MCG/ACT inhaler [id number] No TAKE 2 PUFFS FIRST THING IN AM AND THEN ANOTHER 2 PUFFS ABOUT 12 HOURS LATER. [name], DO 07/14/2023 Active Spouse/Significant Other  cefadroxil (DURICEF) 500 MG  capsule [id number] No Take 500 mg by mouth 2 (two) times daily. [provider] 07/14/2023 Active Spouse/Significant Other  cholestyramine (QUESTRAN) 4 g packet [id number] No Take 1 packet (4 g total) by mouth 2 (two) times daily.  Patient not taking: Reported on 07/14/2023   Sunnie Nielsen, DO Not Taking Active Spouse/Significant Other  EPINEPHrine (EPIPEN 2-PAK) 0.3 mg/0.3 mL IJ SOAJ injection [id number] No Inject 0.3 mg into the muscle as needed for anaphylaxis. [name], DO Taking Active Spouse/Significant Other           Med Note Maisie Fus, Jolly Mango   Fri Jul 14, 2023  2:16 PM) prn  furosemide (LASIX) 40 MG tablet [id number]  Take 1 tablet (40 mg total) by mouth daily. Arnetha Courser, MD  Active   hydrOXYzine (VISTARIL) 25 MG capsule [id number]  TAKE 1 CAPSULE (25 MG TOTAL) BY MOUTH EVERY 8 (EIGHT) HOURS AS NEEDED FOR ITCHING OR ANXIETY. [name], DO  Active   lactulose (CHRONULAC) 10 GM/15ML solution [id number]  Take 67.5 mLs (45 g total) by mouth 2 (two) times daily. Arnetha Courser, MD  Active   magnesium oxide (MAG-OX) 400 (240 Mg) MG tablet [id number] No Take 2 tablets by mouth 2 (two) times daily. [provider] 07/14/2023 Active Spouse/Significant Other  Multiple Vitamin (MULTIVITAMIN) capsule [id number] No Take 1 capsule by mouth daily. [provider] 07/14/2023 Active Spouse/Significant Other  ondansetron (ZOFRAN-ODT) 4 MG disintegrating tablet [id number] No Take 1 tablet (4 mg total) by mouth every 8 (eight) hours as needed for nausea or vomiting. Sunnie Nielsen, DO 07/14/2023 Active Spouse/Significant Other  oxyCODONE (ROXICODONE) 15 MG immediate release tablet [id number]  Take 1 tablet (15 mg total) by mouth every 4 (four)  hours as needed for pain. [name], DO  Active   pantoprazole (PROTONIX) 40 MG tablet [id number] No Take 1 tablet (40 mg total) by mouth daily before breakfast. [name], DO 07/14/2023  Active Spouse/Significant Other  rifaximin (XIFAXAN) 550 MG TABS tablet [id number] No Take 550 mg by mouth 2 (two) times daily. [provider] 07/14/2023 Active   spironolactone (ALDACTONE) 50 MG tablet [id number] No Take 150 mg by mouth daily. [provider] 07/14/2023 Active Spouse/Significant Other  thiamine (VITAMIN B1) 100 MG tablet [id number] No Take 1 tablet (100 mg total) by mouth daily. [name], DO 07/14/2023 Active Spouse/Significant Other            Home Care and Equipment/Supplies: Were Home Health Services Ordered?: NA Any new equipment or medical supplies ordered?: NA  Functional Questionnaire: Do you need assistance with bathing/showering or dressing?: Yes Do you need assistance with meal preparation?: No Do you need assistance with eating?: No Do you have difficulty maintaining continence: No Do you need assistance with getting out of bed/getting out of a chair/moving?: No Do you have difficulty managing or taking your medications?: No  Follow up appointments reviewed: PCP Follow-up appointment confirmed?: NA Specialist Hospital Follow-up appointment confirmed?: Yes Date of Specialist follow-up appointment?: 08/16/23 Follow-Up Specialty Provider:: ortho Do you need transportation to your follow-up appointment?: No Do you understand care options if your condition(s) worsen?: Yes-patient verbalized understanding    SIGNATURE Karena Addison, LPN [facility] Nurse Health Advisor Direct Dial [phone]

## 2023-08-07 NOTE — Telephone Encounter (Signed)
 Medication Refill -  Most Recent Primary Care Visit:  Provider: [name]  Department: [facility]  Visit Type: OFFICE VISIT  Date: 07/14/2023  Medication: oxyCODONE (ROXICODONE) 15 MG immediate release tablet   Has the patient contacted their pharmacy? Yes (Agent: If no, request that the patient contact the pharmacy for the refill. If patient does not wish to contact the pharmacy document the reason why and proceed with request.) (Agent: If yes, when and what did the pharmacy advise?)  Is this the correct pharmacy for this prescription? Yes If no, delete pharmacy and type the correct one.  This is the patient's preferred pharmacy:  [id number] [place] [place] - [address] [place] [address] Phone: [phone] Fax: [phone]  [id number] [place], [place] - [address] AT [facility] [address] [facility] [address] [place] [address] Phone: [phone] Fax: [phone]   Has the prescription been filled recently? Yes  Is the patient out of the medication? Yes  Has the patient been seen for an appointment in the last year OR does the patient have an upcoming appointment? Yes  Can we respond through MyChart? Yes  Agent: Please be advised that Rx refills may take up to 3 business days. We ask that you follow-up with your pharmacy.

## 2023-08-07 NOTE — Telephone Encounter (Signed)
 Spoke with pt who stated pain is a 10 out of 10 and is out of pain medication. Pt is wanting [facility] [place]  Last refill: 07/26/23 #60  Requested Prescriptions  Pending Prescriptions Disp Refills   oxyCODONE (ROXICODONE) 15 MG immediate release tablet 60 tablet 0    Sig: Take 1 tablet (15 mg total) by mouth every 4 (four) hours as needed for pain.     There is no refill protocol information for this order

## 2023-08-07 NOTE — Addendum Note (Signed)
 Addended by: [name] on: 08/07/2023 10:32 AM   Modules accepted: Orders

## 2023-08-07 NOTE — Telephone Encounter (Signed)
 [name] for [facility]one  Tom Pilar, DO [facility] 08/07/2023, 10:32 AM

## 2023-08-07 NOTE — Telephone Encounter (Signed)
  Chief Complaint: post op pain foot surgery done and post op pain Symptoms: pain 10/10  Disposition: [] ED /[] Urgent Care (no appt availability in office) / [] Appointment(In office/virtual)/ []  [place] Virtual [facility]/ [] Home Care/ [] Refused Recommended Disposition /[] [place] Mobile [facility]/ [x]  Follow-up with PCP Additional Notes: [facility] [place]. Stated wife will pick at 2.  Reason for Disposition  Caller requesting a CONTROLLED substance prescription refill (e.g., narcotics, ADHD medicines)  Answer Assessment - Initial Assessment Questions 1. DRUG NAME: "What medicine do you need to have refilled?"     oxycodone 2. REFILLS REMAINING: "How many refills are remaining?" (Note: The label on the medicine or pill bottle will show how many refills are remaining. If there are no refills remaining, then a renewal may be needed.)     zero 3. EXPIRATION DATE: "What is the expiration date?" (Note: The label states when the prescription will expire, and thus can no longer be refilled.)     N/a 4. PRESCRIBING HCP: "Who prescribed it?" Reason: If prescribed by specialist, call should be referred to that group.     PCP 5. SYMPTOMS: "Do you have any symptoms?"     Foot pain had foot surgery and screws were removed from foot 6. PREGNANCY: "Is there any chance that you are pregnant?" "When was your last menstrual period?"     N/a  Protocols used: Medication Refill and Renewal Call-A-AH

## 2023-08-08 ENCOUNTER — Other Ambulatory Visit: Payer: Self-pay | Admitting: *Deleted

## 2023-08-08 NOTE — Patient Instructions (Signed)
 Visit Information  [name]  - as a part of your Medicaid benefit, you are eligible for care management and care coordination services at no cost or copay. I was unable to reach you by phone today but would be happy to help you with your health related needs. Please feel free to call me @ [phone].   A member of the Managed Medicaid care management team will reach out to you again over the next 7 days.   [name] RN, BSN [place]  Value[facility]Based [facility] RN Care Manager [phone]

## 2023-08-08 NOTE — Patient Outreach (Signed)
  Medicaid Managed Care   Unsuccessful Attempt Note   08/08/2023 Name: Tom [name] MRN: [id number] DOB: 1965/09/12  Referred by: [name], DO Reason for referral : High Risk Managed Medicaid (Unsuccessful RNCM follow up telephone outreach)   An unsuccessful telephone outreach was attempted today. The patient was referred to the case management team for assistance with care management and care coordination.    Follow Up Plan: The Managed Medicaid care management team will reach out to the patient again over the next 7 days.    [name] RN, BSN [place]  Value-Based Care Institute [facility] RN Care Manager [phone]

## 2023-08-17 ENCOUNTER — Telehealth: Payer: Self-pay

## 2023-08-17 ENCOUNTER — Other Ambulatory Visit: Payer: Self-pay | Admitting: Family Medicine

## 2023-08-17 ENCOUNTER — Telehealth: Payer: Self-pay | Admitting: Family Medicine

## 2023-08-17 DIAGNOSIS — T8484XA Pain due to internal orthopedic prosthetic devices, implants and grafts, initial encounter: Secondary | ICD-10-CM

## 2023-08-17 DIAGNOSIS — M2559 Pain in other specified joint: Secondary | ICD-10-CM

## 2023-08-17 DIAGNOSIS — Z515 Encounter for palliative care: Secondary | ICD-10-CM

## 2023-08-17 MED ORDER — DICLOFENAC SODIUM 1 % EX GEL: 2.0000 g | Freq: Four times a day (QID) | CUTANEOUS | 3 refills | Status: AC

## 2023-08-17 MED ORDER — OXYCODONE HCL 15 MG PO TABS
15.0000 mg | ORAL_TABLET | ORAL | 0 refills | Status: DC | PRN
Start: 2023-08-17 — End: 2023-08-29

## 2023-08-17 NOTE — Telephone Encounter (Signed)
 [facility]   Saralyn Pilar, DO [facility] 08/17/2023, 1:50 PM

## 2023-08-17 NOTE — Telephone Encounter (Signed)
 Spoke with patient. prescription is ready for pickup at pharmacy.

## 2023-08-17 NOTE — Telephone Encounter (Signed)
 Copied from CRM [id number]. Topic: Clinical - Medication Refill >> Aug 17, 2023 11:56 AM Higinio Roger wrote: Most Recent Primary Care Visit:  Provider: [name]  Department: [facility]  Visit Type: OFFICE VISIT  Date: 07/14/2023  Medication: oxyCODONE (ROXICODONE) 15 MG immediate release tablet   Has the patient contacted their pharmacy? No (Agent: If no, request that the patient contact the pharmacy for the refill. If patient does not wish to contact the pharmacy document the reason why and proceed with request.) Patient said he has trouble remembering anything so he does not know  (Agent: If yes, when and what did the pharmacy advise?)  Is this the correct pharmacy for this prescription? Yes If no, delete pharmacy and type the correct one.  This is the patient's preferred pharmacy:   [id number] [place] [place] - [address] [place] [address] Phone: [phone] Fax: [phone]   Has the prescription been filled recently? Yes  Is the patient out of the medication? No. Patient states he is getting really low  Has the patient been seen for an appointment in the last year OR does the patient have an upcoming appointment? Yes  Can we respond through MyChart? Yes  Agent: Please be advised that Rx refills may take up to 3 business days. We ask that you follow-up with your pharmacy.

## 2023-08-17 NOTE — Telephone Encounter (Signed)
 Copied from CRM [id number]. Topic: General - Call Back - No Documentation >> Aug 17, 2023 11:59 AM Higinio Roger wrote: Reason for CRM: Patient is requesting a callback from Saralyn Pilar, DO to discuss getting refill for  oxyCODONE (ROXICODONE) 15 MG immediate release tablet. A refill request has been placed and patient was advised that it may take up to 3 business days. Callback #: [phone]

## 2023-08-17 NOTE — Telephone Encounter (Signed)
 Most Recent Primary Care Visit:  Provider: [name]  Department: [facility]  Visit Type: OFFICE VISIT  Date: 07/14/2023  Medication: voltaren arthritis pain relief gel Patient tried it and is helping with arthritis. Requesting prescription to be sent in.   Has the patient contacted their pharmacy? Yes Contact the office.   Is this the correct pharmacy for this prescription? Yes.  This is the patient's preferred pharmacy:  [id number] [place] [place] - [address] [place] [address] Phone: [phone] Fax: [phone]  Has the prescription been filled recently? No  Has the patient been seen for an appointment in the last year OR does the patient have an upcoming appointment? Yes  Can we respond through MyChart? Yes  Agent: Please be advised that Rx refills may take up to 3 business days. We ask that you follow-up with your pharmacy.

## 2023-08-29 ENCOUNTER — Telehealth: Payer: Self-pay

## 2023-08-29 ENCOUNTER — Other Ambulatory Visit: Payer: Self-pay | Admitting: *Deleted

## 2023-08-29 DIAGNOSIS — Z515 Encounter for palliative care: Secondary | ICD-10-CM

## 2023-08-29 DIAGNOSIS — T8484XA Pain due to internal orthopedic prosthetic devices, implants and grafts, initial encounter: Secondary | ICD-10-CM

## 2023-08-29 MED ORDER — OXYCODONE HCL 15 MG PO TABS: 15.0000 mg | ORAL_TABLET | ORAL | 0 refills | Status: DC | PRN

## 2023-08-29 NOTE — Addendum Note (Signed)
 Addended by: [name] on: 08/29/2023 04:55 PM   Modules accepted: Orders

## 2023-08-29 NOTE — Telephone Encounter (Signed)
 Patient called requesting a refill on oxycodone 15mg .  Patient did report he was not out of medication, but it had been 10 days.

## 2023-08-29 NOTE — Telephone Encounter (Signed)
 Rx refill sent.  Saralyn Pilar, DO [facility] 08/29/2023, 4:55 PM

## 2023-08-29 NOTE — Patient Outreach (Signed)
   Care Management/Care Coordination  RN Case Manager Case Closure Note  08/29/2023 Name: Tom [name] MRN: [id number] DOB: 02-May-1966  Tom [name] is a 58 y.o. year old male who is a primary care patient of [name], DO. The care management/care coordination team was consulted for assistance with chronic disease management and/or care coordination needs.   Care Plan : RN Care Manager Plan of Care  Updates made by [name], RN since 08/29/2023 12:00 AM  Completed 08/29/2023   Problem: Health Managment needs related to Liver Cirrhosis Resolved 08/29/2023     Long-Range Goal: Development of Plan of Care to address Health Managment needs related to Liver Cirrhosis Completed 08/29/2023  Start Date: 02/01/2023  Expected End Date: 08/18/2023  Note:   Current Barriers:  Knowledge Deficits related to plan of care for management of Liver Cirrohsis    RNCM Clinical Goal(s):  Patient will verbalize understanding of plan for management of Liver Cirrhosis as evidenced by patient reports attend all scheduled medical appointments: 06/27/23 with PCP as evidenced by provider documentation in EMR        continue to work with RN Care Manager and/or Social Worker to address care management and care coordination needs related to Liver Cirrhosis as evidenced by adherence to CM Team Scheduled appointments     through collaboration with Medical illustrator, provider, and care team.   Interventions: Evaluation of current treatment plan related to  self management and patient's adherence to plan as established by provider Collaboration with SW regarding ACP-family will contact [name] today   Pain Interventions:  (Status:  Goal Met.) Long Term Goal Pain assessment performed Medications reviewed Discussed importance of adherence to all scheduled medical appointments Advised patient to report to care team affect of pain on daily activities Discussed post op pain, advised  patient to follow up with Orthopedic with concerns  Liver Cirrhosis  (Status: Goal Met.) Long Term Goal  Evaluation of current treatment plan related to  Liver Cirrhosis ,  self-management and patient's adherence to plan as established by provider. Discussed plans with patient for ongoing care management follow up and provided patient with direct contact information for care management team Advised patient to keep a calendar of visits, providers and contact information; Reviewed medications with patient and discussed the importance of taking lactulose and having bowel movement every day; Assessed social determinant of health barriers;  Advised patient to discuss any concerns or questions regarding liver transplant with Hepatology-discussed sending MyChart message is the best way to communicate  Discussed the involvement of Palliative Care, patient felt services not needed at this time-revisited Provided therapeutic listening Discussed authorization is needed with [facility] and [facility] to access health information, advised patient to discuss at next visit Discussed the importance of having 2-3 bowel movements daily Discussed Hepatology visit on 07/24/23, possible shunt repair-patient and family to consider   Patient Goals/Self-Care Activities: Take medications as prescribed   Attend all scheduled provider appointments Call provider office for new concerns or questions        Plan: The patient has met all care management goals, agreed to case closure, and has been provided with contact information for the care management team. Appropriate care team members and provider have been notified via electronic communication. The care management team is available to at any time in the future should needs arise.   [name] RN, BSN   Value-Based Care Institute [facility] RN Care Manager [phone]

## 2023-09-13 ENCOUNTER — Ambulatory Visit: Admitting: Family Medicine

## 2023-09-13 ENCOUNTER — Ambulatory Visit (INDEPENDENT_AMBULATORY_CARE_PROVIDER_SITE_OTHER): Admitting: Family Medicine

## 2023-09-13 ENCOUNTER — Encounter: Payer: Self-pay | Admitting: Family Medicine

## 2023-09-13 VITALS — BP 112/58 | HR 87 | Ht 71.0 in | Wt 201.8 lb

## 2023-09-13 DIAGNOSIS — T8484XA Pain due to internal orthopedic prosthetic devices, implants and grafts, initial encounter: Secondary | ICD-10-CM

## 2023-09-13 DIAGNOSIS — K4090 Unilateral inguinal hernia, without obstruction or gangrene, not specified as recurrent: Secondary | ICD-10-CM | POA: Diagnosis not present

## 2023-09-13 DIAGNOSIS — K7031 Alcoholic cirrhosis of liver with ascites: Secondary | ICD-10-CM

## 2023-09-13 DIAGNOSIS — R600 Localized edema: Secondary | ICD-10-CM | POA: Diagnosis not present

## 2023-09-13 DIAGNOSIS — Z515 Encounter for palliative care: Secondary | ICD-10-CM | POA: Diagnosis not present

## 2023-09-13 MED ORDER — OXYCODONE HCL 15 MG PO TABS: 15.0000 mg | ORAL_TABLET | ORAL | 0 refills | Status: DC | PRN

## 2023-09-13 NOTE — Patient Instructions (Addendum)
 Thank you for coming to the office today.  Dose increase Lasix 40mg  x 2 = 80mg  daily for 3-7 days. Then return to normal dose  Dose increase Spironolactone 50mg  x 2 = 100mg  daily for 3-7 days then return to normal dose  Then resume once of each daily for maintenance.  - E - Elevation - if significant swelling, lift leg above heart level (toes above your nose) to help reduce swelling, most helpful at night after day of being on your feet  Recommend recliner to help improve the elevation.  [facility] [address] [place],  [place]  [address] Phone: [phone]  Refill Oxycodone today.  Please schedule a Follow-up Appointment to: Return if symptoms worsen or fail to improve.  If you have any other questions or concerns, please feel free to call the office or send a message through MyChart. You may also schedule an earlier appointment if necessary.  Additionally, you may be receiving a survey about your experience at our office within a few days to 1 week by e-mail or mail. We value your feedback.  Saralyn Pilar, DO [facility], [place]

## 2023-09-13 NOTE — Progress Notes (Signed)
 Subjective:    Patient ID: Tom [name], male    DOB: 09-Sep-1965, 58 y.o.   MRN: [id number]  Tom [name] is a 58 y.o. male presenting on 09/13/2023 for Ankle Pain (Right ankle pain and swelling )   HPI  Discussed the use of AI scribe software for clinical note transcription with the patient, who gave verbal consent to proceed.  History of Present Illness   Tom [name] is a 58 year old male who presents with swelling and pain in the right foot.   He underwent removal of orthopedic hardware from his right foot on August 03, 2023 at [facility] Dr Laverda Sorenson. Since the procedure, he has experienced persistent swelling in the right foot, which worsened about a week ago. The swelling initially reduced while under anesthesia but returned once the effects wore off. He describes the swelling as 'crazy' and notes that it does not bother him when swollen, but significant pain occurs when the swelling subsides, likening it to 'walking on nails.' - He did see Ortho 2 weeks after removal procedure and was advised doing well  He has a history of cellulitis and is concerned about its recurrence. He uses a knee scooter for mobility around the house, which may be affecting his previously replaced knee and contributing to fluid retention. He is currently taking furosemide 40 mg once daily and spironolactone 50 mg once daily for fluid management but reports limited effectiveness, as he does not urinate frequently and sometimes struggles to urinate due to a hernia.  He reports a hernia in the R groin area, initially mistaking it for a testicular issue. He describes it as the muscle between the intestines poking out, causing significant pain, at times bringing him to tears. He is interested in surgical intervention for the hernia.  He also mentions dealing with cirrhosis and a past episode of elevated ammonia levels, which affected his cognitive function. He has been compliant with his  ammonia medication since then. He has no desire to resume alcohol consumption   He experiences chronic pain, which has not improved since the removal of the screws from his foot. He manages his pain with oxycodone here, every 2 weeks, due for new order. with the last prescription filled on August 29, 2023. He expresses frustration with the persistent pain, which limits his ability to perform daily activities such as yard work and pool maintenance.      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          05/16/2023    5:40 PM 02/22/2023    2:40 PM 03/21/2022    2:56 PM  Depression screen PHQ 2/9  Decreased Interest 0 0 0  Down, Depressed, Hopeless 0 0 0  PHQ - 2 Score 0 0 0  Altered sleeping 0  0  Tired, decreased energy 0  0  Change in appetite 0  0  Feeling bad or failure about yourself  0  0  Trouble concentrating 0  0  Moving slowly or fidgety/restless 0  0  Suicidal thoughts 0  0  PHQ-9 Score 0  0  Difficult doing work/chores Not difficult at all  Not difficult at all       09/21/2022   11:26 AM 12/16/2020    3:16 PM  GAD 7 :  Generalized Anxiety Score  Nervous, Anxious, on Edge 0 0  Control/stop worrying 0 0  Worry too much - different things 0 0  Trouble relaxing 0 0  Restless 0 0  Easily annoyed or irritable 0 0  Afraid - awful might happen 0 0  Total GAD 7 Score 0 0  Anxiety Difficulty Not difficult at all Not difficult at all    Social History   Tobacco Use   Smoking status: Former    Current packs/day: 0.00    Average packs/day: 1 pack/day for 39.0 years (39.0 ttl pk-yrs)    Types: Cigarettes    Start date: 12/1976    Quit date: 12/2015    Years since quitting: 7.7   Smokeless tobacco: Never  Vaping Use   Vaping status: Never Used  Substance Use Topics   Alcohol use: No   Drug use: Yes     Types: Marijuana    Review of Systems Per HPI unless specifically indicated above     Objective:    BP (!) 112/58 (BP Location: Left Arm, Patient Position: Sitting, Cuff Size: Normal)   Pulse 87   Ht 5\' 11"  (1.803 m)   Wt 201 lb 12.8 oz (91.5 kg)   SpO2 99%   BMI 28.15 kg/m   Wt Readings from Last 3 Encounters:  09/13/23 201 lb 12.8 oz (91.5 kg)  07/14/23 200 lb (90.7 kg)  06/01/23 227 lb 6.4 oz (103.1 kg)    Physical Exam Vitals and nursing note reviewed.  Constitutional:      General: He is not in acute distress.    Appearance: Normal appearance. He is well-developed. He is not diaphoretic.     Comments: Well-appearing, comfortable, cooperative  HENT:     Head: Normocephalic and atraumatic.  Eyes:     General:        Right eye: No discharge.        Left eye: No discharge.     Conjunctiva/sclera: Conjunctivae normal.  Cardiovascular:     Rate and Rhythm: Normal rate.  Pulmonary:     Effort: Pulmonary effort is normal.  Abdominal:     Hernia: A hernia (Right inguinal hernia large, reducible) is present.  Musculoskeletal:     Right lower leg: Edema (+3 pitting edema extensive entire leg ankle foot) present.     Left lower leg: No edema.  Skin:    General: Skin is warm and dry.     Findings: No erythema or rash.  Neurological:     Mental Status: He is alert and oriented to person, place, and time.  Psychiatric:        Mood and Affect: Mood normal.        Behavior: Behavior normal.        Thought Content: Thought content normal.     Comments: Well groomed, good eye contact, normal speech and thoughts     Results for orders placed or performed during the hospital encounter of 07/14/23  Lipase, blood   Collection Time: 07/14/23 10:40 AM  Result Value Ref Range   Lipase 26 11 - 51 U/L  Comprehensive metabolic panel   Collection Time: 07/14/23 10:40 AM  Result Value Ref Range   Sodium 130 (L) 135 - 145 mmol/L   Potassium 3.8 3.5 - 5.1 mmol/L   Chloride 95  (L) 98 - 111 mmol/L   CO2 22 22 - 32 mmol/L   Glucose, Bld 127 (H) 70 - 99 mg/dL   BUN 43 (H) 6 -  20 mg/dL   Creatinine, Ser 1.61 (H) 0.61 - 1.24 mg/dL   Calcium 09.6 8.9 - 04.5 mg/dL   Total Protein 8.0 6.5 - 8.1 g/dL   Albumin 3.3 (L) 3.5 - 5.0 g/dL   AST 47 (H) 15 - 41 U/L   ALT 27 0 - 44 U/L   Alkaline Phosphatase 83 38 - 126 U/L   Total Bilirubin 1.6 (H) 0.0 - 1.2 mg/dL   GFR, Estimated 52 (L) >60 mL/min   Anion gap 13 5 - 15  CBC   Collection Time: 07/14/23 10:40 AM  Result Value Ref Range   WBC 6.3 4.0 - 10.5 K/uL   RBC 4.05 (L) 4.22 - 5.81 MIL/uL   Hemoglobin 10.9 (L) 13.0 - 17.0 g/dL   HCT 40.9 (L) 81.1 - 91.4 %   MCV 82.2 80.0 - 100.0 fL   MCH 26.9 26.0 - 34.0 pg   MCHC 32.7 30.0 - 36.0 g/dL   RDW 78.2 95.6 - 21.3 %   Platelets 402 (H) 150 - 400 K/uL   nRBC 0.0 0.0 - 0.2 %  Ammonia   Collection Time: 07/14/23 10:40 AM  Result Value Ref Range   Ammonia 84 (H) 9 - 35 umol/L  Protime-INR   Collection Time: 07/14/23 12:55 PM  Result Value Ref Range   Prothrombin Time 16.3 (H) 11.4 - 15.2 seconds   INR 1.3 (H) 0.8 - 1.2  Comprehensive metabolic panel   Collection Time: 07/15/23  3:12 AM  Result Value Ref Range   Sodium 129 (L) 135 - 145 mmol/L   Potassium 4.2 3.5 - 5.1 mmol/L   Chloride 95 (L) 98 - 111 mmol/L   CO2 22 22 - 32 mmol/L   Glucose, Bld 104 (H) 70 - 99 mg/dL   BUN 41 (H) 6 - 20 mg/dL   Creatinine, Ser 0.86 (H) 0.61 - 1.24 mg/dL   Calcium 9.6 8.9 - 57.8 mg/dL   Total Protein 7.5 6.5 - 8.1 g/dL   Albumin 3.0 (L) 3.5 - 5.0 g/dL   AST 41 15 - 41 U/L   ALT 25 0 - 44 U/L   Alkaline Phosphatase 82 38 - 126 U/L   Total Bilirubin 1.3 (H) 0.0 - 1.2 mg/dL   GFR, Estimated 44 (L) >60 mL/min   Anion gap 12 5 - 15  CBC   Collection Time: 07/15/23  3:12 AM  Result Value Ref Range   WBC 7.3 4.0 - 10.5 K/uL   RBC 3.71 (L) 4.22 - 5.81 MIL/uL   Hemoglobin 9.8 (L) 13.0 - 17.0 g/dL   HCT 46.9 (L) 62.9 - 52.8 %   MCV 82.5 80.0 - 100.0 fL   MCH 26.4 26.0 - 34.0  pg   MCHC 32.0 30.0 - 36.0 g/dL   RDW 41.3 24.4 - 01.0 %   Platelets 377 150 - 400 K/uL   nRBC 0.0 0.0 - 0.2 %  Protime-INR   Collection Time: 07/15/23  3:12 AM  Result Value Ref Range   Prothrombin Time 16.8 (H) 11.4 - 15.2 seconds   INR 1.3 (H) 0.8 - 1.2  Urinalysis, Complete w Microscopic -Urine, Clean Catch   Collection Time: 07/15/23 12:27 PM  Result Value Ref Range   Color, Urine YELLOW (A) YELLOW   APPearance CLEAR (A) CLEAR   Specific Gravity, Urine 1.014 1.005 - 1.030   pH 6.0 5.0 - 8.0   Glucose, UA NEGATIVE NEGATIVE mg/dL   Hgb urine dipstick NEGATIVE NEGATIVE   Bilirubin Urine NEGATIVE  NEGATIVE   Ketones, ur NEGATIVE NEGATIVE mg/dL   Protein, ur NEGATIVE NEGATIVE mg/dL   Nitrite NEGATIVE NEGATIVE   Leukocytes,Ua NEGATIVE NEGATIVE   RBC / HPF 0-5 0 - 5 RBC/hpf   WBC, UA 0 0 - 5 WBC/hpf   Bacteria, UA NONE SEEN NONE SEEN   Squamous Epithelial / HPF 0 0 - 5 /HPF      Assessment & Plan:   Problem List Items Addressed This Visit     Alcoholic cirrhosis of liver with ascites (HCC)   Edema of right lower extremity - Primary   Other Visit Diagnoses       Right inguinal hernia       Relevant Orders   Ambulatory referral to General Surgery     Painful orthopaedic hardware [facility])       Relevant Medications   oxyCODONE (ROXICODONE) 15 MG immediate release tablet     Palliative care patient       Relevant Medications   oxyCODONE (ROXICODONE) 15 MG immediate release tablet      Right Lower Extremity Edema Acute on chronic problem Persistent swelling post-hardware removal (08/03/23 [facility]) exacerbated by knee scooter use and R knee arthritis and Cirrhosis with Ascites / chronic edema.  No sign of cellulitis or complication with the edema. Pain level appears stable from previous  Current diuretics ineffective. - Lasix 40mg  and Spiro 50mg  daily currently per Hepatology - Increase furosemide to 80 mg daily for 3-7 days, then return to normal dose. - Increase  spironolactone to 100 mg daily for 3-7 days, then return to normal dose. - Encourage leg elevation above heart level, using a recliner if possible. - Adjust elevation technique for improved effectiveness. 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 Surgical intervention desired. - Refer to [place] through [facility] for surgical evaluation and potential repair. - Consider hernia truss for temporary management. Return precautions given if severe or worsening symptoms  Cirrhosis with ascites Cirrhosis with previous elevated ammonia levels. Compliant with alcohol abstinence. - Monitor liver function and ammonia levels. - Ensure compliance with ammonia-lowering medication.         Orders Placed This Encounter  Procedures   Ambulatory referral to General Surgery    Referral Priority:   Routine    Referral Type:   Surgical    Referral Reason:   Specialty Services Required    Requested Specialty:   General Surgery    Number of Visits Requested:   1    Meds ordered this encounter  Medications   oxyCODONE (ROXICODONE) 15 MG immediate release tablet    Sig: Take 1 tablet (15 mg total) by mouth every 4 (four) hours as needed for pain.    Dispense:  60 tablet    Refill:  0    First fill 09/13/2023    Follow up plan: Return if symptoms worsen or fail to improve.   Saralyn Pilar, DO [facility] 09/13/2023, 3:54 PM

## 2023-09-15 ENCOUNTER — Other Ambulatory Visit: Payer: Self-pay

## 2023-09-15 DIAGNOSIS — K4090 Unilateral inguinal hernia, without obstruction or gangrene, not specified as recurrent: Secondary | ICD-10-CM

## 2023-09-18 ENCOUNTER — Ambulatory Visit: Payer: Self-pay | Admitting: Surgery

## 2023-09-23 DIAGNOSIS — Z888 Allergy status to other drugs, medicaments and biological substances status: Secondary | ICD-10-CM | POA: Diagnosis not present

## 2023-09-23 DIAGNOSIS — R2241 Localized swelling, mass and lump, right lower limb: Secondary | ICD-10-CM | POA: Diagnosis not present

## 2023-09-23 DIAGNOSIS — R410 Disorientation, unspecified: Secondary | ICD-10-CM | POA: Diagnosis not present

## 2023-09-23 DIAGNOSIS — M25571 Pain in right ankle and joints of right foot: Secondary | ICD-10-CM | POA: Diagnosis not present

## 2023-09-23 DIAGNOSIS — R1031 Right lower quadrant pain: Secondary | ICD-10-CM | POA: Diagnosis not present

## 2023-09-23 DIAGNOSIS — M79671 Pain in right foot: Secondary | ICD-10-CM | POA: Diagnosis not present

## 2023-09-23 DIAGNOSIS — Z7982 Long term (current) use of aspirin: Secondary | ICD-10-CM | POA: Diagnosis not present

## 2023-09-23 DIAGNOSIS — Z883 Allergy status to other anti-infective agents status: Secondary | ICD-10-CM | POA: Diagnosis not present

## 2023-09-23 DIAGNOSIS — Z87891 Personal history of nicotine dependence: Secondary | ICD-10-CM | POA: Diagnosis not present

## 2023-09-23 DIAGNOSIS — G8929 Other chronic pain: Secondary | ICD-10-CM | POA: Diagnosis not present

## 2023-09-23 DIAGNOSIS — M1990 Unspecified osteoarthritis, unspecified site: Secondary | ICD-10-CM | POA: Diagnosis not present

## 2023-09-23 DIAGNOSIS — M7989 Other specified soft tissue disorders: Secondary | ICD-10-CM | POA: Diagnosis not present

## 2023-09-28 DIAGNOSIS — K4090 Unilateral inguinal hernia, without obstruction or gangrene, not specified as recurrent: Secondary | ICD-10-CM | POA: Diagnosis not present

## 2023-10-01 ENCOUNTER — Emergency Department
Admission: EM | Admit: 2023-10-01 | Discharge: 2023-10-01 | Disposition: A | Discharge status: Home / Self Care | Attending: Emergency Medicine | Admitting: Emergency Medicine

## 2023-10-01 ENCOUNTER — Other Ambulatory Visit: Payer: Self-pay | Admitting: Family Medicine

## 2023-10-01 ENCOUNTER — Other Ambulatory Visit: Payer: Self-pay

## 2023-10-01 DIAGNOSIS — T8484XA Pain due to internal orthopedic prosthetic devices, implants and grafts, initial encounter: Secondary | ICD-10-CM

## 2023-10-01 DIAGNOSIS — Z515 Encounter for palliative care: Secondary | ICD-10-CM

## 2023-10-01 DIAGNOSIS — M79671 Pain in right foot: Secondary | ICD-10-CM | POA: Diagnosis not present

## 2023-10-01 DIAGNOSIS — M25571 Pain in right ankle and joints of right foot: Secondary | ICD-10-CM | POA: Diagnosis not present

## 2023-10-01 DIAGNOSIS — I959 Hypotension, unspecified: Secondary | ICD-10-CM | POA: Diagnosis not present

## 2023-10-01 MED ORDER — OXYCODONE HCL 5 MG PO TABS
10.0000 mg | ORAL_TABLET | Freq: Once | ORAL | Status: AC
  Administered 2023-10-01: 10 mg via ORAL
  Filled 2023-10-01: qty 2

## 2023-10-01 MED ORDER — OXYCODONE HCL ER 15 MG PO T12A
15.0000 mg | EXTENDED_RELEASE_TABLET | Freq: Two times a day (BID) | ORAL | Status: DC
  Filled 2023-10-01: qty 1

## 2023-10-01 NOTE — ED Triage Notes (Signed)
 Pt comes via EMS with c/o right foot pain for about 8 years. Pt from home and ran out of oxy pain meds and has been out for two days. Pt normally takes 4 -15 mg per day.  VSS

## 2023-10-01 NOTE — Discharge Instructions (Signed)
 You have been diagnosed with right foot pain.  Please call tomorrow to your PCP for a refill of your oxycodone.  You can come back to ED or go to your PCP if you have new symptoms or symptoms worsen.

## 2023-10-01 NOTE — ED Provider Notes (Signed)
 [facility] Provider Note    Event Date/Time   First MD Initiated Contact with Patient 10/01/23 1556     (approximate)   History   Foot Pain    HPI  Tom [name] is a 58 y.o. male    with a past medical history of cirrhosis secondary to alcohol, right ankle fusion, who presents to the ED complaining of right foot pain. According to the patient, he takes oxycodone 15 mg every 4 hours, patient did not refill the prescription on Friday, he is under contract.  Patient states he wants pain medicine during admission, he will refill his prescription tomorrow.       Physical Exam   Triage Vital Signs: ED Triage Vitals  Encounter Vitals Group     BP 10/01/23 1438 126/66     Systolic BP Percentile --      Diastolic BP Percentile --      Pulse Rate 10/01/23 1438 86     Resp 10/01/23 1438 17     Temp 10/01/23 1438 98 F (36.7 C)     Temp src --      SpO2 10/01/23 1438 97 %     Weight --      Height --      Head Circumference --      Peak Flow --      Pain Score 10/01/23 1437 10     Pain Loc --      Pain Education --      Exclude from Growth Chart --     Most recent vital signs: Vitals:   10/01/23 1438  BP: 126/66  Pulse: 86  Resp: 17  Temp: 98 F (36.7 C)  SpO2: 97%     Constitutional: Alert, NAD. Able to speak in complete sentences without cough or dyspnea  Eyes: Conjunctivae are normal.  Icteric Head: Atraumatic. Nose: No congestion/rhinnorhea. Mouth/Throat: Mucous membranes are moist.   Neck: Painless ROM. Supple. No JVD, nodes, thyromegaly  Cardiovascular:   Good peripheral circulation.RRR no murmurs, gallops, rubs  Respiratory: Normal respiratory effort.  No retractions. Clear to auscultation bilaterally without wheezing or crackles  Gastrointestinal: Soft and nontender.  Musculoskeletal:  no deformity Right lower extremity: Right ankle: Skin intact, edema grade 3, ROM absent.  Pulses positive. Neurologic:  MAE  spontaneously. No gross focal neurologic deficits are appreciated.  Skin:  Skin is warm, dry and intact. No rash noted. Psychiatric: Mood and affect are normal. Speech and behavior are normal.    ED Results / Procedures / Treatments   Labs (all labs ordered are listed, but only abnormal results are displayed) Labs Reviewed - No data to display   EKG     RADIOLOGY     PROCEDURES:  Critical Care performed:   Procedures   MEDICATIONS ORDERED IN ED: Medications  oxyCODONE (Oxy IR/ROXICODONE) immediate release tablet 10 mg (10 mg Oral Given 10/01/23 1659)      IMPRESSION / MDM / ASSESSMENT AND PLAN / ED COURSE  I reviewed the triage vital signs and the nursing notes.  Differential diagnosis includes, but is not limited to, right ankle pain, fracture, cirrhosis.  Patient's presentation is most consistent with acute, uncomplicated illness.   Patient's diagnosis is consistent with chronic right ankle pain, cirrhosis. I did review the patient's allergies and medications.The patient is in stable and satisfactory condition for discharge home  Patient will be discharged home without prescriptions. Patient is to follow up with PCP as needed or otherwise  directed.  Patient will call tomorrow to his PCP for a refill of his pain medicine, patient is under pain contract. Patient is given ED precautions to return to the ED for any worsening or new symptoms. Discussed plan of care with patient, answered all of patient's questions, Patient agreeable to plan of care. Advised patient to take medications according to the instructions on the label. Discussed possible side effects of new medications. Patient verbalized understanding.    FINAL CLINICAL IMPRESSION(S) / ED DIAGNOSES   Final diagnoses:  Right foot pain     Rx / DC Orders   ED Discharge Orders     None        Note:  This document was prepared using Dragon voice recognition software and may include unintentional  dictation errors.   Awilda Lennox, PA-C 10/01/23 1701    Bryson Carbine, MD 10/01/23 [phone]

## 2023-10-02 ENCOUNTER — Telehealth: Payer: Self-pay

## 2023-10-02 ENCOUNTER — Other Ambulatory Visit: Payer: Self-pay | Admitting: Family Medicine

## 2023-10-02 ENCOUNTER — Ambulatory Visit: Payer: Self-pay

## 2023-10-02 DIAGNOSIS — T8484XA Pain due to internal orthopedic prosthetic devices, implants and grafts, initial encounter: Secondary | ICD-10-CM

## 2023-10-02 DIAGNOSIS — Z515 Encounter for palliative care: Secondary | ICD-10-CM

## 2023-10-02 MED ORDER — OXYCODONE HCL 15 MG PO TABS
15.0000 mg | ORAL_TABLET | ORAL | 0 refills | Status: DC | PRN
Start: 2023-10-02 — End: 2023-10-14

## 2023-10-02 NOTE — Telephone Encounter (Signed)
 Okay to notify patient that rx has been sent and he should check with the pharmacy later today.  [facility]  oxyCODONE (ROXICODONE) 15 MG immediate release tablet 60 tablet 0 10/02/2023 --   Sig - Route: Take 1 tablet (15 mg total) by mouth every 4 (four) hours as needed for pain. - Oral   Sent to pharmacy as: oxyCODONE (ROXICODONE) 15 MG immediate release tablet   Earliest Fill Date: 10/02/2023   E-Prescribing Status: Receipt confirmed by pharmacy (10/02/2023 12:29 PM EDT)    Domingo Friend, DO [facility] 10/02/2023, 12:30 PM

## 2023-10-02 NOTE — Telephone Encounter (Signed)
 Patient transferred to Nurse Triage due to report of pain. Patient calling in to request urgent refill of his Oxycodone 15 mg. Patient was seen in ED yesterady for pain. Patient states he did not get any medication to go home with and states his PCP is the only HCP that can prescribe him medication for pain. Patient states "please ask pcp to hurry to refill, I am in a lot of pain." Patient did not agree with acute triage, and states PCP is aware of his chronic pain.   Copied from CRM [id number]. Topic: Clinical - Red Word Triage >> Oct 02, 2023  8:06 AM Tom [name] wrote: Red Word that prompted transfer to Nurse Triage: Patient is calling to report that he is out of pain medication oxyCODONE (ROXICODONE) 15 MG immediate release tablet [[id number]]. Patient reporting that he is in pain.

## 2023-10-02 NOTE — Telephone Encounter (Signed)
 Copied from CRM [id number]. Topic: Clinical - Prescription Issue >> Oct 02, 2023 12:48 PM Oddis Bench wrote: Reason for CRM: Patient is calling about oxyCODONE (ROXICODONE) 15 MG immediate release tablet would like to have call back.

## 2023-10-02 NOTE — Telephone Encounter (Signed)
 Copied from CRM [id number]. Topic: Clinical - Red Word Triage >> Oct 02, 2023 12:50 PM Oddis Bench wrote: Red Word that prompted transfer to Nurse Triage: Patient is calling bc is out of oxyCODONE (ROXICODONE) 15 MG immediate release tablet, he is stating that he is in severe pain and might need to go to ER  Pt. Calling to verify oxycodone was refilled. No triage needed.

## 2023-10-02 NOTE — Telephone Encounter (Signed)
 Pt called back regarding refill. He states he is in a lot of pain and requests that this medication be refilled asap. Please advise.  Advised pt that if pain was this intense he could go to ED for medication.

## 2023-10-02 NOTE — Telephone Encounter (Signed)
Tried calling patient - no answer and VM full.

## 2023-10-02 NOTE — Telephone Encounter (Signed)
Refer to med refill request.

## 2023-10-02 NOTE — Telephone Encounter (Signed)
 Copied from CRM [id number]. Topic: Clinical - Medication Refill >> Oct 02, 2023  8:04 AM Leory Rands wrote: Most Recent Primary Care Visit:  Provider: Raina Bunting  Department: [facility]  Visit Type: OFFICE VISIT  Date: 09/13/2023  Medication: oxyCODONE (ROXICODONE) 15 MG immediate release tablet [[id number]]  Has the patient contacted their pharmacy? Yes (Agent: If no, request that the patient contact the pharmacy for the refill. If patient does not wish to contact the pharmacy document the reason why and proceed with request.) (Agent: If yes, when and what did the pharmacy advise?)  Is this the correct pharmacy for this prescription? Yes If no, delete pharmacy and type the correct one.  This is the patient's preferred pharmacy:  [id number] [place] [place] - [address] [place] [address] Phone: [phone] Fax: [phone]    Has the prescription been filled recently? Yes  Is the patient out of the medication? Yes  Has the patient been seen for an appointment in the last year OR does the patient have an upcoming appointment? Yes  Can we respond through MyChart? Yes  Agent: Please be advised that Rx refills may take up to 3 business days. We ask that you follow-up with your pharmacy.

## 2023-10-03 ENCOUNTER — Other Ambulatory Visit: Payer: Self-pay | Admitting: Family Medicine

## 2023-10-03 NOTE — Telephone Encounter (Unsigned)
 Copied from CRM [id number]. Topic: Clinical - Medication Refill >> Oct 03, 2023 10:42 AM Baldemar Lev wrote: Most Recent Primary Care Visit:  Provider: Raina Bunting  Department: [facility]  Visit Type: OFFICE VISIT  Date: 09/13/2023  Medication: lactulose (CHRONULAC) 10 GM/15ML solution   Has the patient contacted their pharmacy? Yes (Agent: If no, request that the patient contact the pharmacy for the refill. If patient does not wish to contact the pharmacy document the reason why and proceed with request.) (Agent: If yes, when and what did the pharmacy advise?)  Is this the correct pharmacy for this prescription? Yes If no, delete pharmacy and type the correct one.  This is the patient's preferred pharmacy:  [id number] [place], [place] - [address] [place] [address] Phone: [phone] Fax: [phone]   Has the prescription been filled recently? Yes  Is the patient out of the medication? Yes  Has the patient been seen for an appointment in the last year OR does the patient have an upcoming appointment? Yes  Can we respond through MyChart? Yes  Agent: Please be advised that Rx refills may take up to 3 business days. We ask that you follow-up with your pharmacy.

## 2023-10-04 ENCOUNTER — Other Ambulatory Visit: Payer: Self-pay | Admitting: Family Medicine

## 2023-10-04 MED ORDER — LACTULOSE 10 GM/15ML PO SOLN: 45.0000 g | Freq: Two times a day (BID) | ORAL | 2 refills | Status: DC

## 2023-10-04 NOTE — Telephone Encounter (Signed)
 Requested medication (s) are due for refill today - yes  Requested medication (s) are on the active medication list -yes  Future visit scheduled -no  Last refill: 07/15/23 2RF  Notes to clinic: outside provider  Requested Prescriptions  Pending Prescriptions Disp Refills   lactulose (CHRONULAC) 10 GM/15ML solution 2000 mL 2    Sig: Take 67.5 mLs (45 g total) by mouth 2 (two) times daily.     Gastroenterology:  Laxatives - lactulose Failed - 10/04/2023 11:48 AM      Failed - Cl in normal range and within 360 days    Chloride  Date Value Ref Range Status  07/15/2023 95 (L) 98 - 111 mmol/L Final         Failed - Na in normal range and within 360 days    Sodium  Date Value Ref Range Status  07/15/2023 129 (L) 135 - 145 mmol/L Final         Passed - CO2 in normal range and within 360 days    CO2  Date Value Ref Range Status  07/15/2023 22 22 - 32 mmol/L Final         Passed - K in normal range and within 360 days    Potassium  Date Value Ref Range Status  07/15/2023 4.2 3.5 - 5.1 mmol/L Final         Passed - Valid encounter within last 12 months    Recent Outpatient Visits           3 weeks ago Edema of right lower extremity   [place] [facility] [place], [name], DO   2 months ago Alcoholic cirrhosis of liver with ascites [facility])   [place] [facility] [name], DO                 Requested Prescriptions  Pending Prescriptions Disp Refills   lactulose (CHRONULAC) 10 GM/15ML solution 2000 mL 2    Sig: Take 67.5 mLs (45 g total) by mouth 2 (two) times daily.     Gastroenterology:  Laxatives - lactulose Failed - 10/04/2023 11:48 AM      Failed - Cl in normal range and within 360 days    Chloride  Date Value Ref Range Status  07/15/2023 95 (L) 98 - 111 mmol/L Final         Failed - Na in normal range and within 360 days    Sodium  Date Value Ref Range Status  07/15/2023 129 (L) 135 -  145 mmol/L Final         Passed - CO2 in normal range and within 360 days    CO2  Date Value Ref Range Status  07/15/2023 22 22 - 32 mmol/L Final         Passed - K in normal range and within 360 days    Potassium  Date Value Ref Range Status  07/15/2023 4.2 3.5 - 5.1 mmol/L Final         Passed - Valid encounter within last 12 months    Recent Outpatient Visits           3 weeks ago Edema of right lower extremity   [place] [facility], [name], DO   2 months ago Alcoholic cirrhosis of liver with ascites [facility])   [place] [facility], [name], [place]

## 2023-10-04 NOTE — Telephone Encounter (Unsigned)
 Copied from CRM [id number]. Topic: Clinical - Medication Refill >> Oct 04, 2023  4:22 PM Rosaria Common wrote: Most Recent Primary Care Visit:  Provider: Raina Bunting  Department: [facility]  Visit Type: OFFICE VISIT  Date: 09/13/2023  Medication: lactulose (CHRONULAC) 10 GM/15ML  Has the patient contacted their pharmacy? No.  (Agent: If no, request that the patient contact the pharmacy for the refill. If patient does not wish to contact the pharmacy document the reason why and proceed with request.) (Agent: If yes, when and what did the pharmacy advise?)  Is this the correct pharmacy for this prescription? Yes If no, delete pharmacy and type the correct one.  This is the patient's preferred pharmacy:  [id number] [place] [place] - [address] [place] [address] Phone: [phone] Fax: [phone]     Has the prescription been filled recently? No  Is the patient out of the medication? Yes  Has the patient been seen for an appointment in the last year OR does the patient have an upcoming appointment? Yes  Can we respond through MyChart? No  Agent: Please be advised that Rx refills may take up to 3 business days. We ask that you follow-up with your pharmacy.

## 2023-10-05 NOTE — Telephone Encounter (Signed)
 Refused Lactulose 10 GM/15 ml because this is a duplicate order.

## 2023-10-06 ENCOUNTER — Encounter: Payer: Self-pay | Admitting: Family Medicine

## 2023-10-14 ENCOUNTER — Other Ambulatory Visit: Payer: Self-pay | Admitting: Family Medicine

## 2023-10-14 DIAGNOSIS — Z515 Encounter for palliative care: Secondary | ICD-10-CM

## 2023-10-14 DIAGNOSIS — T8484XA Pain due to internal orthopedic prosthetic devices, implants and grafts, initial encounter: Secondary | ICD-10-CM

## 2023-10-16 ENCOUNTER — Other Ambulatory Visit: Payer: Self-pay

## 2023-10-16 ENCOUNTER — Telehealth: Payer: Self-pay | Admitting: Family Medicine

## 2023-10-16 MED ORDER — OXYCODONE HCL 15 MG PO TABS: 15.0000 mg | ORAL_TABLET | ORAL | 0 refills | Status: DC | PRN

## 2023-10-16 NOTE — Telephone Encounter (Signed)
 Rx refill signed just now. Thank you  Domingo Friend, DO [facility] 10/16/2023, 9:47 AM

## 2023-10-16 NOTE — Telephone Encounter (Signed)
 Tom [name] ([employer]ed) PA Case ID #: [id number] Rx #: [id number] Need Help? Call us  at [phone] Status sent iconSent to Plan today Drug oxyCODONE  HCl 15MG  tablets ePA cloud logo Form [employer] [name]  Medicaid Electronic PA Form [id number] NCPDP) Original Claim Info [id number] PLAN LIMITATION EXCEEDED D/D 4. CALL [phone] OR SUBMIT PA TO [email or web address]/[email or web address] LIMITATION EXCEEDED 5 DAYS SUPPLY.CALL [phone] OR SUBMIT PA TO [email or web address]

## 2023-10-16 NOTE — Telephone Encounter (Signed)
 Copied from CRM [id number]. Topic: Clinical - Medication Question >> Oct 16, 2023 10:32 AM Georgeann Kindred wrote: Reason for CRM: Patient requesting a callback from Dr. Jacklin Mascot or his nurse. Please contact patient at [phone].

## 2023-10-16 NOTE — Telephone Encounter (Signed)
 Attempted to reach patient, call cannot be completed as dialed. Rx sent to pharmacy.

## 2023-10-16 NOTE — Telephone Encounter (Signed)
 Patient was warmed transferred to this RN because he wanted to make sure his "Rx request went through correctly". Patient is requesting a refill of 15 MG oxycodone . Advised I would forward to provider.

## 2023-10-16 NOTE — Telephone Encounter (Signed)
 Copied from CRM [id number]. Topic: Clinical - Medication Refill >> Oct 16, 2023  8:00 AM Leory Rands wrote: Most Recent Primary Care Visit:  Provider: Raina Bunting  Department: [facility]  Visit Type: OFFICE VISIT  Date: 09/13/2023  Medication: oxyCODONE  (ROXICODONE ) 15 MG immediate release tablet [[id number]]  Has the patient contacted their pharmacy? Yes (Agent: If no, request that the patient contact the pharmacy for the refill. If patient does not wish to contact the pharmacy document the reason why and proceed with request.) (Agent: If yes, when and what did the pharmacy advise?)  Is this the correct pharmacy for this prescription? Yes If no, delete pharmacy and type the correct one.  This is the patient's preferred pharmacy:  [id number] [place]  - [address] [place] [address] Phone: [phone] Fax: [phone]     Has the prescription been filled recently? Yes  Is the patient out of the medication? Yes  Has the patient been seen for an appointment in the last year OR does the patient have an upcoming appointment? Yes  Can we respond through MyChart? Yes  Agent: Please be advised that Rx refills may take up to 3 business days. We ask that you follow-up with your pharmacy.

## 2023-10-16 NOTE — Telephone Encounter (Signed)
 Jonothan Neve ([employer]) PA Case ID #: [id number] Rx #: [id number] Need Help? Call us  at [phone] Outcome Approved today by [facility]  Medicaid PA Case: [id number], Status: Approved, Coverage Starts on: 10/16/2023 12:00:00 AM, Coverage Ends on: 04/13/2024 12:00:00 AM. Effective Date: 10/16/2023 Authorization Expiration Date: 04/13/2024 Drug oxyCODONE  HCl 15MG  tablets ePA cloud logo Form [employer] [place]  Medicaid Electronic PA Form (2017 NCPDP) Original Claim Info [id number] PLAN LIMITATION EXCEEDED D/D 4. CALL [phone] OR SUBMIT PA TO [email or web address]/PLAN LIMITATION EXCEEDED 5 DAYS SUPPLY.CALL [phone] OR SUBMIT PA TO [email or web address]

## 2023-10-16 NOTE — Telephone Encounter (Signed)
 Attempted to reach patient at listed number - call cannot be completed as dialed.

## 2023-10-26 ENCOUNTER — Other Ambulatory Visit: Payer: Self-pay | Admitting: Family Medicine

## 2023-10-26 DIAGNOSIS — T8484XA Pain due to internal orthopedic prosthetic devices, implants and grafts, initial encounter: Secondary | ICD-10-CM

## 2023-10-26 DIAGNOSIS — Z515 Encounter for palliative care: Secondary | ICD-10-CM

## 2023-10-26 MED ORDER — OXYCODONE HCL 15 MG PO TABS: 15.0000 mg | ORAL_TABLET | ORAL | 0 refills | Status: DC | PRN

## 2023-10-27 ENCOUNTER — Ambulatory Visit: Payer: Self-pay | Admitting: *Deleted

## 2023-10-27 NOTE — Telephone Encounter (Signed)
 Chief Complaint: requesting oxycodone  refill and appt with PCP for possible referral for surgery  Symptoms: s/p hiatal hernia . Reports severe pain with out medication . Patient did not give specific information regarding pain other than he has had pain for a long time and PCP helps him deal with pain.  Frequency: na  Pertinent Negatives: Patient denies fever Disposition: [] ED /[] Urgent Care (no appt availability in office) / [x] Appointment(In office/virtual)/ []  [place] [facility]/ [] Home Care/ [] Refused Recommended Disposition /[] [place] [facility]/ []  Follow-up with PCP Additional Notes:    Patient requesting oxycodone  refill. In review of chart, last order refill 10/26/23. [facility] and no answer only recordings and unable to speak to pharmacy staff. NT left message for pharmacy staff to contact patient [phone] to review if Rx ready for pick up . Patient requesting to see PCP for appt to discuss referral for possible surgical intervention regarding 'liver" and was reported possible issues with psoriasis causing patient not to get surgery. Appt scheduled for 10/30/23 with PCP. Patient talking and distracted and reported phone connection bad and call disconnected.        Copied from CRM [id number]. Topic: Clinical - Red Word Triage >> Oct 27, 2023  8:12 AM Rosamond Comes wrote: Red Word that prompted transfer to Nurse Triage: patient calling in patient said Dr Romeo Co, needs to talk provider about medicine, Patient has a hiatal hernia and is in severe pain Reason for Disposition  Age > 60 years    Age 58  Answer Assessment - Initial Assessment Questions 1. LOCATION: "Where does it hurt?"      S/p hiatal hernia , patient did not report where 2. RADIATION: "Does the pain shoot anywhere else?" (e.g., chest, back)     na 3. ONSET: "When did the pain begin?" (Minutes, hours or days ago)      On going and calling to request pain med  refill 4. SUDDEN: "Gradual or sudden onset?"     na 5. PATTERN "Does the pain come and go, or is it constant?"    - If it comes and goes: "How long does it last?" "Do you have pain now?"     (Note: Comes and goes means the pain is intermittent. It goes away completely between bouts.)    - If constant: "Is it getting better, staying the same, or getting worse?"      (Note: Constant means the pain never goes away completely; most serious pain is constant and gets worse.)      na 6. SEVERITY: "How bad is the pain?"  (e.g., Scale 1-10; mild, moderate, or severe)    - MILD (1-3): Doesn't interfere with normal activities, abdomen soft and not tender to touch.     - MODERATE (4-7): Interferes with normal activities or awakens from sleep, abdomen tender to touch.     - SEVERE (8-10): Excruciating pain, doubled over, unable to do any normal activities.       Severe according to PAS report 7. RECURRENT SYMPTOM: "Have you ever had this type of stomach pain before?" If Yes, ask: "When was the last time?" and "What happened that time?"      Yes  8. CAUSE: "What do you think is causing the stomach pain?"     Hiatal hernia 9. RELIEVING/AGGRAVATING FACTORS: "What makes it better or worse?" (e.g., antacids, bending or twisting motion, bowel movement)     na 10. OTHER SYMPTOMS: "Do you have any  other symptoms?" (e.g., back pain, diarrhea, fever, urination pain, vomiting)       Requesting pain medication refilll and referral to Dr for possible surgery  Protocols used: Abdominal Pain - Male-A-AH

## 2023-10-30 ENCOUNTER — Ambulatory Visit: Admitting: Family Medicine

## 2023-11-06 ENCOUNTER — Ambulatory Visit: Payer: Self-pay

## 2023-11-06 ENCOUNTER — Telehealth: Payer: Self-pay

## 2023-11-06 DIAGNOSIS — Z515 Encounter for palliative care: Secondary | ICD-10-CM

## 2023-11-06 DIAGNOSIS — T8484XA Pain due to internal orthopedic prosthetic devices, implants and grafts, initial encounter: Secondary | ICD-10-CM

## 2023-11-06 MED ORDER — OXYCODONE HCL 15 MG PO TABS
15.0000 mg | ORAL_TABLET | ORAL | 0 refills | Status: DC | PRN
Start: 2023-11-06 — End: 2023-11-20

## 2023-11-06 NOTE — Addendum Note (Signed)
 Addended by: Raina Bunting on: 11/06/2023 04:59 PM   Modules accepted: Orders

## 2023-11-06 NOTE — Telephone Encounter (Signed)
 Copied from CRM [id number]. Topic: Clinical - Medication Question >> Nov 06, 2023  3:28 PM Emylou G wrote: Patient had concerns about his refill: oxyCODONE  (ROXICODONE ) 15 MG immediate release tablet .Aaron Aas Would like call back.. number on file is good.. I asked if he wanted refill? But wasn't clear?

## 2023-11-06 NOTE — Telephone Encounter (Signed)
 Copied from CRM [id number]. Topic: Clinical - Medication Question >> Nov 06, 2023 10:00 AM Chrystal Crape R wrote: Pt calling for assistance with his medication. 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 Reason for Disposition  [1] Caller requesting NON-URGENT health information AND [2] PCP's office is the best resource  Answer Assessment - Initial Assessment Questions 1. REASON FOR CALL or QUESTION: "What is your reason for calling today?" or "How can I best help you?" or "What question do you have that I can help answer?"     pt called stating he needs pain medication (oxycodone ) and his medication for cystic fibrosis refilled.  Pt stated he has not had the cystic fibrosis medication filled in a while and does not remember the name of it. Pt stated the office usually assists him with his medications due to his disability.  Protocols used: Information Only Call - No Triage-A-AH

## 2023-11-06 NOTE — Telephone Encounter (Signed)
 Tried calling patient no answer VM is full at this time.

## 2023-11-06 NOTE — Telephone Encounter (Signed)
 Refilled Oxycodone .  Duricef antibiotic is an old medication, he was on this earlier in the year for cellulitis. He should be off of it and not continuing unless he follows back up with Infection specialist  Domingo Friend, DO [facility] 11/06/2023, 5:00 PM

## 2023-11-20 ENCOUNTER — Telehealth: Payer: Self-pay | Admitting: Family Medicine

## 2023-11-20 ENCOUNTER — Telehealth: Payer: Self-pay

## 2023-11-20 DIAGNOSIS — Z515 Encounter for palliative care: Secondary | ICD-10-CM

## 2023-11-20 DIAGNOSIS — G8929 Other chronic pain: Secondary | ICD-10-CM

## 2023-11-20 DIAGNOSIS — T8484XA Pain due to internal orthopedic prosthetic devices, implants and grafts, initial encounter: Secondary | ICD-10-CM

## 2023-11-20 MED ORDER — OXYCODONE HCL 15 MG PO TABS: 15.0000 mg | ORAL_TABLET | ORAL | 0 refills | Status: DC | PRN

## 2023-11-20 NOTE — Telephone Encounter (Signed)
 Attempted to reach patient, call can not be completed as dailed.

## 2023-11-20 NOTE — Telephone Encounter (Signed)
 Please notify patient rx oxycodone  has been signed and sent to his pharmacy.  Domingo Friend, DO [facility] 11/20/2023, 2:30 PM

## 2023-11-20 NOTE — Telephone Encounter (Signed)
 Copied from CRM [id number]. Topic: Clinical - Medication Refill >> Nov 20, 2023 11:30 AM Carlatta H wrote: Medication: oxyCODONE  (ROXICODONE ) 15 MG immediate release tablet [[id number]]  Has the patient contacted their pharmacy? No (Agent: If no, request that the patient contact the pharmacy for the refill. If patient does not wish to contact the pharmacy document the reason why and proceed with request.) (Agent: If yes, when and what did the pharmacy advise?)  This is the patient's preferred pharmacy:  [id number] [place] [place] - [address] [place] [address] Phone: [phone] Fax: [phone]    Is this the correct pharmacy for this prescription? No If no, delete pharmacy and type the correct one.   Has the prescription been filled recently? No  Is the patient out of the medication? Yes  Has the patient been seen for an appointment in the last year OR does the patient have an upcoming appointment? No  Can we respond through MyChart? No  Agent: Please be advised that Rx refills may take up to 3 business days. We ask that you follow-up with your pharmacy.

## 2023-11-20 NOTE — Telephone Encounter (Signed)
 Copied from CRM [id number]. Topic: Clinical - Medication Question >> Nov 20, 2023  2:08 PM Yolanda T wrote: Reason for CRM: patient is requesting a call back from Dr Anell Baptist nurse about the refill on his oxyCODONE  (ROXICODONE ) 15 MG immediate release tablet. I advised the request was put in today and can take up to 72hrs for the refill but he still wanted the nurse to call him back

## 2023-11-20 NOTE — Telephone Encounter (Signed)
 Rx refill sent to pharmacy.  Domingo Friend, DO [facility] 11/20/2023, 2:31 PM

## 2023-11-27 ENCOUNTER — Telehealth: Payer: Self-pay

## 2023-11-27 NOTE — Telephone Encounter (Signed)
 Copied from CRM [id number]. Topic: Clinical - Prescription Issue >> Nov 27, 2023  8:48 AM Rosaria Common wrote: Reason for CRM: Pt calling to check the status of prescription refills. [id number] [place] [place] - [address] [place] [address] Phone: [phone]  [facility] and he stated that Oxcycodone was picked up on Tuesday 6/3. Pt seems confused and would like a nurse to return his call at [phone] or [phone].

## 2023-11-28 ENCOUNTER — Telehealth: Payer: Self-pay

## 2023-11-28 ENCOUNTER — Emergency Department
Admission: EM | Admit: 2023-11-28 | Discharge: 2023-11-28 | Disposition: A | Discharge status: Home / Self Care | Attending: Emergency Medicine | Admitting: Emergency Medicine

## 2023-11-28 ENCOUNTER — Other Ambulatory Visit: Payer: Self-pay

## 2023-11-28 DIAGNOSIS — G8929 Other chronic pain: Secondary | ICD-10-CM | POA: Diagnosis not present

## 2023-11-28 DIAGNOSIS — Z515 Encounter for palliative care: Secondary | ICD-10-CM

## 2023-11-28 DIAGNOSIS — I10 Essential (primary) hypertension: Secondary | ICD-10-CM | POA: Diagnosis not present

## 2023-11-28 DIAGNOSIS — M79671 Pain in right foot: Secondary | ICD-10-CM | POA: Diagnosis not present

## 2023-11-28 DIAGNOSIS — I959 Hypotension, unspecified: Secondary | ICD-10-CM | POA: Diagnosis not present

## 2023-11-28 DIAGNOSIS — K469 Unspecified abdominal hernia without obstruction or gangrene: Secondary | ICD-10-CM | POA: Diagnosis not present

## 2023-11-28 DIAGNOSIS — J4489 Other specified chronic obstructive pulmonary disease: Secondary | ICD-10-CM | POA: Insufficient documentation

## 2023-11-28 MED ORDER — OXYCODONE HCL 15 MG PO TABS: 15.0000 mg | ORAL_TABLET | ORAL | 0 refills | Status: DC | PRN

## 2023-11-28 MED ORDER — MORPHINE SULFATE (PF) 4 MG/ML IV SOLN
4.0000 mg | Freq: Once | INTRAVENOUS | Status: AC
  Administered 2023-11-28: 4 mg via INTRAMUSCULAR
  Filled 2023-11-28: qty 1

## 2023-11-28 NOTE — Telephone Encounter (Signed)
 I called the pharmacy, and sent in new rx Oxycodone . He can pick it up today however it will not be covered by insurance if he picks it up today. The pharmacy will use a goodrx coupon. I am not sure the price. They did not have the price available. If he picks it up tomorrow 6/11 it should be covered by insurance. It will be ready today if he chooses to do that option, or he can wait.  Domingo Friend, DO [facility] 11/28/2023, 1:13 PM

## 2023-11-28 NOTE — Telephone Encounter (Signed)
 Attempted to call patient, mailbox is full and can't receive messages.   Ok to advise if patient calls back

## 2023-11-28 NOTE — Addendum Note (Signed)
 Addended by: Raina Bunting on: 11/28/2023 01:13 PM   Modules accepted: Orders

## 2023-11-28 NOTE — Discharge Instructions (Signed)
 As we discussed please follow-up with your doctor today to discuss your pain management regimen and further follow-up.  You have been provided a one-time dose of pain medication in the emergency department, however as we discussed we cannot continue to provide pain management for chronic condition and you will need to see your primary care doctor for any further pain management needs.  Return to the emergency department for any emergent issue.

## 2023-11-28 NOTE — ED Provider Notes (Signed)
 [facility] Provider Note    Event Date/Time   First MD Initiated Contact with Patient 11/28/23 1016     (approximate)  History   Chief Complaint: Foot Pain  HPI  Tom [name] is a 58 y.o. male with a past medical history of asthma, COPD, hypertension, hyperlipidemia, presents to the emergency department for right foot pain.  Patient states a history of chronic right foot pain has had multiple surgeries to the foot.  Has chronic swelling to the foot.  Patient is on chronic pain medication in reviewing the patient's PDMP it appears that the patient gets 10-day prescriptions for oxycodone  last filled on 6/2.  Patient states the pain has been more significant recently and he has run out early unable to get a refill until the 12th.  Patient states he called his doctor and they told only thing he can do is go to the emergency department if he is in acute pain so he came here.  Physical Exam   Triage Vital Signs: ED Triage Vitals  Encounter Vitals Group     BP 11/28/23 1021 122/68     Systolic BP Percentile --      Diastolic BP Percentile --      Pulse Rate 11/28/23 1021 72     Resp 11/28/23 1021 16     Temp 11/28/23 1021 98 F (36.7 C)     Temp Source 11/28/23 1021 Oral     SpO2 11/28/23 1021 100 %     Weight 11/28/23 1022 195 lb (88.5 kg)     Height 11/28/23 1022 6' (1.829 m)     Head Circumference --      Peak Flow --      Pain Score 11/28/23 1022 8     Pain Loc --      Pain Education --      Exclude from Growth Chart --     Most recent vital signs: Vitals:   11/28/23 1021  BP: 122/68  Pulse: 72  Resp: 16  Temp: 98 F (36.7 C)  SpO2: 100%    General: Awake, no distress.  CV:  Good peripheral perfusion.   Resp:  Normal effort.   Abd:  No distention.  Soft.  Patient has a right sided inguinal hernia which he states is chronic.   ED Results / Procedures / Treatments   MEDICATIONS ORDERED IN ED: Medications  morphine  (PF) 4  MG/ML injection 4 mg (has no administration in time range)     IMPRESSION / MDM / ASSESSMENT AND PLAN / ED COURSE  I reviewed the triage vital signs and the nursing notes.  Patient's presentation is most consistent with exacerbation of chronic illness.  Patient presents to the emergency department for acute on chronic right foot pain.  Patient's sees pain management/his doctor for chronic pain medication but ran out 2 days early.  Patient denies any acute injury.  I discussed with the patient that we are unable to provide chronic pain management for the patient however we could dose a one-time dose of pain medication but this would be a one-time dose and that he would need to follow-up with his doctor.  Patient is agreeable to this plan.  On my examination foot is warm, no concern for ischemia.  No erythema or signs of cellulitis/infection.  FINAL CLINICAL IMPRESSION(S) / ED DIAGNOSES   Acute on chronic right foot pain   Note:  This document was prepared using Dragon voice recognition software and  may include unintentional dictation errors.   Ruth Cove, MD 11/28/23 1036

## 2023-11-28 NOTE — ED Triage Notes (Signed)
 Pt coming from home d/t right foot pain and general pain management. Pt reports being out of his pain medication and cannot get a refill for 2 more days.

## 2023-11-28 NOTE — Telephone Encounter (Signed)
 Copied from CRM [id number]. Topic: Clinical - Medication Question >> Nov 28, 2023  1:45 PM Emylou G wrote: Adv patient on message from doctor about medication.. he says will try and make it til tomorrow

## 2023-11-28 NOTE — Telephone Encounter (Signed)
 Copied from CRM [id number]. Topic: Clinical - Medication Question >> Nov 28, 2023 11:03 AM Loreda Rodriguez T wrote: Reason for CRM: patient was leaving [facility] Emergency Department at [facility] due to foot pain. He wanted to know if Dr Linnell Richardson would refill his oxyCODONE  (ROXICODONE ) 15 MG immediate release tablet. Advised he had just gotten them refilled and picked up on 6/3. Patient said he was in a lot of pain and need more medication. Please f/u with patient

## 2023-12-04 ENCOUNTER — Telehealth: Payer: Self-pay

## 2023-12-04 NOTE — Telephone Encounter (Signed)
 Tried calling patient no answer or VM    Ok to advise if he returns call

## 2023-12-04 NOTE — Telephone Encounter (Signed)
 Copied from CRM [id number]. Topic: Clinical - Medication Question >> Dec 04, 2023  3:34 PM Minus Amel G wrote: Reason for CRM: oxyCODONE  (ROXICODONE ) 15 MG immediate release tablet [[id number]]. Patient would like to know what day he can put in the request for refill?

## 2023-12-04 NOTE — Telephone Encounter (Signed)
 For Oxycodone  refill. Soonest is 10 days from last fill 11/28/23, therefore 12/08/23.   Domingo Friend, DO [facility] 12/04/2023, 4:22 PM

## 2023-12-06 DIAGNOSIS — K4090 Unilateral inguinal hernia, without obstruction or gangrene, not specified as recurrent: Secondary | ICD-10-CM | POA: Diagnosis not present

## 2023-12-08 ENCOUNTER — Other Ambulatory Visit: Payer: Self-pay | Admitting: Family Medicine

## 2023-12-08 DIAGNOSIS — G8929 Other chronic pain: Secondary | ICD-10-CM

## 2023-12-08 DIAGNOSIS — L299 Pruritus, unspecified: Secondary | ICD-10-CM

## 2023-12-08 DIAGNOSIS — Z515 Encounter for palliative care: Secondary | ICD-10-CM

## 2023-12-08 NOTE — Telephone Encounter (Unsigned)
 Copied from CRM [id number]. Topic: Clinical - Medication Refill >> Dec 08, 2023  9:43 AM Lynnie Saucier S wrote: Medication: oxyCODONE  (ROXICODONE ) 15 MG immediate release tablet  Has the patient contacted their pharmacy? No (Agent: If no, request that the patient contact the pharmacy for the refill. If patient does not wish to contact the pharmacy document the reason why and proceed with request.) (Agent: If yes, when and what did the pharmacy advise?)call provider  This is the patient's preferred pharmacy:  [id number] [place] [place] - [address] [place] [address] Phone: [phone] Fax: [phone]  Is this the correct pharmacy for this prescription? Yes If no, delete pharmacy and type the correct one.   Has the prescription been filled recently? No  Is the patient out of the medication? No  Has the patient been seen for an appointment in the last year OR does the patient have an upcoming appointment? Yes  Can we respond through MyChart? Yes  Agent: Please be advised that Rx refills may take up to 3 business days. We ask that you follow-up with your pharmacy.

## 2023-12-08 NOTE — Telephone Encounter (Unsigned)
 Copied from CRM [id number]. Topic: Clinical - Medication Refill >> Dec 08, 2023 12:08 PM DeAngela L wrote: Medication: oxyCODONE  (ROXICODONE ) 15 MG immediate release tablet hydrOXYzine  (VISTARIL ) 25 MG capsule  Has the patient contacted their pharmacy? No  (Agent: If no, request that the patient contact the pharmacy for the refill. If patient does not wish to contact the pharmacy document the reason why and proceed with request.) (Agent: If yes, when and what did the pharmacy advise?)  This is the patient's preferred pharmacy:  [id number] [place] [place] - [address] [place] [address] Phone: [phone] Fax: [phone]  Is this the correct pharmacy for this prescription? Yes  If no, delete pharmacy and type the correct one.   Has the prescription been filled recently? Yes   Is the patient out of the medication? Yes   Has the patient been seen for an appointment in the last year OR does the patient have an upcoming appointment? Yes   Can we respond through MyChart? No  Agent: Please be advised that Rx refills may take up to 3 business days. We ask that you follow-up with your pharmacy.

## 2023-12-08 NOTE — Telephone Encounter (Unsigned)
 Copied from CRM [id number]. Topic: Clinical - Medication Refill >> Dec 08, 2023  9:45 AM Lynnie Saucier S wrote: Medication: hydrOXYzine  (VISTARIL ) 25 MG capsule  Has the patient contacted their pharmacy? No (Agent: If no, request that the patient contact the pharmacy for the refill. If patient does not wish to contact the pharmacy document the reason why and proceed with request.) (Agent: If yes, when and what did the pharmacy advise?)  This is the patient's preferred pharmacy:  [id number] [place] [place] - [address] [place] [address] Phone: [phone] Fax: [phone]   Is this the correct pharmacy for this prescription? Yes If no, delete pharmacy and type the correct one.   Has the prescription been filled recently? No  Is the patient out of the medication? Yes  Has the patient been seen for an appointment in the last year OR does the patient have an upcoming appointment? Yes  Can we respond through MyChart? Yes  Agent: Please be advised that Rx refills may take up to 3 business days. We ask that you follow-up with your pharmacy.

## 2023-12-11 NOTE — Telephone Encounter (Signed)
 Copied from CRM [id number]. Topic: Clinical - Medication Question >> Dec 11, 2023  9:17 AM Ernestene P wrote: Reason for CRM: Pt called to check on refills for hydrOXYzine  Pamoate 25 mg & oxyCODONE  HCl 15 mg advise it can take up 3 business days- pt would like nurse to callback [phone]

## 2023-12-11 NOTE — Telephone Encounter (Signed)
 Requested medications are due for refill today.  yes  Requested medications are on the active medications list.  yes  Last refill. Hydroxyzine  07/21/2023 #270 0 rf, oxycodone  11/28/2023 #60 0 rf  Future visit scheduled.   no  Notes to clinic.  Please review for refill.    Requested Prescriptions  Pending Prescriptions Disp Refills   hydrOXYzine  (VISTARIL ) 25 MG capsule 270 capsule 0    Sig: Take 1 capsule (25 mg total) by mouth every 8 (eight) hours as needed for itching or anxiety.     Ear, Nose, and Throat:  Antihistamines 2 Failed - 12/11/2023  5:26 PM      Failed - Cr in normal range and within 360 days    Creat  Date Value Ref Range Status  06/27/2023 1.20 0.70 - 1.30 mg/dL Final   Creatinine, Ser  Date Value Ref Range Status  07/15/2023 1.75 (H) 0.61 - 1.24 mg/dL Final         Passed - Valid encounter within last 12 months    Recent Outpatient Visits           2 months ago Edema of right lower extremity   [place] [facility] [place], Marsa PARAS, DO   4 months ago Alcoholic cirrhosis of liver with ascites [facility])   [place] [facility], Marsa PARAS, DO               oxyCODONE  (ROXICODONE ) 15 MG immediate release tablet 60 tablet 0    Sig: Take 1 tablet (15 mg total) by mouth every 4 (four) hours as needed for pain.     Not Delegated - Analgesics:  Opioid Agonists Failed - 12/11/2023  5:26 PM      Failed - This refill cannot be delegated      Failed - Urine Drug Screen completed in last 360 days      Failed - Valid encounter within last 3 months    Recent Outpatient Visits           2 months ago Edema of right lower extremity   [place] [facility] [place], Marsa PARAS, DO   4 months ago Alcoholic cirrhosis of liver with ascites [facility])   [place] [facility] [place], Marsa PARAS, [place]

## 2023-12-11 NOTE — Telephone Encounter (Signed)
 Requested medications are due for refill today.  yes  Requested medications are on the active medications list.  yes  Last refill. Hydroxyzine  07/21/2023 #270 0 rf, oxycodone  11/28/2023 #60 0 rf  Future visit scheduled.   no  Notes to clinic.  Please review for refill.    Requested Prescriptions  Pending Prescriptions Disp Refills   hydrOXYzine  (VISTARIL ) 25 MG capsule 270 capsule 0    Sig: Take 1 capsule (25 mg total) by mouth every 8 (eight) hours as needed for itching or anxiety.     Ear, Nose, and Throat:  Antihistamines 2 Failed - 12/11/2023  5:28 PM      Failed - Cr in normal range and within 360 days    Creat  Date Value Ref Range Status  06/27/2023 1.20 0.70 - 1.30 mg/dL Final   Creatinine, Ser  Date Value Ref Range Status  07/15/2023 1.75 (H) 0.61 - 1.24 mg/dL Final         Passed - Valid encounter within last 12 months    Recent Outpatient Visits           2 months ago Edema of right lower extremity   [place] [facility] [place], Marsa PARAS, DO   4 months ago Alcoholic cirrhosis of liver with ascites [facility])   [place] [facility], Marsa PARAS, DO               oxyCODONE  (ROXICODONE ) 15 MG immediate release tablet 60 tablet 0    Sig: Take 1 tablet (15 mg total) by mouth every 4 (four) hours as needed for pain.     Not Delegated - Analgesics:  Opioid Agonists Failed - 12/11/2023  5:28 PM      Failed - This refill cannot be delegated      Failed - Urine Drug Screen completed in last 360 days      Failed - Valid encounter within last 3 months    Recent Outpatient Visits           2 months ago Edema of right lower extremity   [place] [facility] [place], Marsa PARAS, DO   4 months ago Alcoholic cirrhosis of liver with ascites [facility])   [place] [facility] [place], Marsa PARAS, [place]

## 2023-12-11 NOTE — Telephone Encounter (Signed)
 Requested medications are due for refill today.  yes  Requested medications are on the active medications list.  yes  Last refill. 11/28/2023 #60 0 rf  Future visit scheduled.   no  Notes to clinic.  Refill not delegated.    Requested Prescriptions  Pending Prescriptions Disp Refills   oxyCODONE  (ROXICODONE ) 15 MG immediate release tablet 60 tablet 0    Sig: Take 1 tablet (15 mg total) by mouth every 4 (four) hours as needed for pain.     Not Delegated - Analgesics:  Opioid Agonists Failed - 12/11/2023  5:22 PM      Failed - This refill cannot be delegated      Failed - Urine Drug Screen completed in last 360 days      Failed - Valid encounter within last 3 months    Recent Outpatient Visits           2 months ago Edema of right lower extremity   [place] [facility] [place], Marsa PARAS, DO   4 months ago Alcoholic cirrhosis of liver with ascites [facility]   [place] [facility] [place], Marsa PARAS, [place]

## 2023-12-12 MED ORDER — OXYCODONE HCL 15 MG PO TABS: 15.0000 mg | ORAL_TABLET | ORAL | 0 refills | Status: DC | PRN

## 2023-12-12 MED ORDER — HYDROXYZINE PAMOATE 25 MG PO CAPS: 25.0000 mg | ORAL_CAPSULE | Freq: Three times a day (TID) | ORAL | 0 refills | Status: DC | PRN

## 2023-12-13 ENCOUNTER — Telehealth: Payer: Self-pay

## 2023-12-13 DIAGNOSIS — Z515 Encounter for palliative care: Secondary | ICD-10-CM

## 2023-12-13 DIAGNOSIS — L299 Pruritus, unspecified: Secondary | ICD-10-CM

## 2023-12-13 DIAGNOSIS — G8929 Other chronic pain: Secondary | ICD-10-CM

## 2023-12-13 MED ORDER — HYDROXYZINE PAMOATE 25 MG PO CAPS: 25.0000 mg | ORAL_CAPSULE | Freq: Three times a day (TID) | ORAL | 0 refills | Status: DC | PRN

## 2023-12-13 MED ORDER — OXYCODONE HCL 15 MG PO TABS
15.0000 mg | ORAL_TABLET | ORAL | 0 refills | Status: DC | PRN
Start: 2023-12-13 — End: 2023-12-21

## 2023-12-13 NOTE — Telephone Encounter (Signed)
 Copied from CRM [id number]. Topic: Clinical - Prescription Issue >> Dec 13, 2023  9:48 AM Montie POUR wrote: Tom [name] is calling back to see when Dr. Edman will resend order for hydrOXYzine  (VISTARIL ) 25 MG capsule  Tom [name]'s insurance will only let Dr. Edman sign order for this medication. Please call Tom [name] at [phone] to update him on order for this medication. Thanks

## 2023-12-13 NOTE — Addendum Note (Signed)
 Addended by: EDMAN MARSA PARAS on: 12/13/2023 10:22 AM   Modules accepted: Orders

## 2023-12-13 NOTE — Telephone Encounter (Signed)
 Rx sent to pharmacy.  [name], DO [facility] 12/13/2023, 10:22 AM

## 2023-12-21 ENCOUNTER — Other Ambulatory Visit: Payer: Self-pay | Admitting: Family Medicine

## 2023-12-21 DIAGNOSIS — Z515 Encounter for palliative care: Secondary | ICD-10-CM

## 2023-12-21 DIAGNOSIS — G8929 Other chronic pain: Secondary | ICD-10-CM

## 2023-12-21 MED ORDER — OXYCODONE HCL 15 MG PO TABS: 15.0000 mg | ORAL_TABLET | ORAL | 0 refills | Status: DC | PRN

## 2023-12-21 NOTE — Telephone Encounter (Signed)
 Refill signed for Oxycodone  12/21/23  [name], DO [facility] 12/21/2023, 4:46 PM

## 2023-12-21 NOTE — Telephone Encounter (Signed)
 Copied from CRM [id number]. Topic: Clinical - Medication Refill >> Dec 21, 2023  4:23 PM Sasha H wrote: Medication: oxyCODONE  (ROXICODONE ) 15 MG immediate release tablet   Has the patient contacted their pharmacy? Yes (Agent: If no, request that the patient contact the pharmacy for the refill. If patient does not wish to contact the pharmacy document the reason why and proceed with request.) (Agent: If yes, when and what did the pharmacy advise?)  This is the patient's preferred pharmacy:  [id number] [place] [place] - [address] [place] [address] Phone: [phone] Fax: [phone]  Is this the correct pharmacy for this prescription? Yes If no, delete pharmacy and type the correct one.   Has the prescription been filled recently? Yes  Is the patient out of the medication? Yes  Has the patient been seen for an appointment in the last year OR does the patient have an upcoming appointment? Yes  Can we respond through MyChart? Yes  Agent: Please be advised that Rx refills may take up to 3 business days. We ask that you follow-up with your pharmacy.

## 2023-12-27 ENCOUNTER — Ambulatory Visit: Payer: Self-pay

## 2023-12-27 DIAGNOSIS — G8929 Other chronic pain: Secondary | ICD-10-CM

## 2023-12-27 DIAGNOSIS — Z515 Encounter for palliative care: Secondary | ICD-10-CM

## 2023-12-27 NOTE — Telephone Encounter (Signed)
 FYI Only or Action Required?: FYI  Patient was last seen in primary care on 09/13/2023 by Edman Marsa PARAS, DO.  Called Nurse Triage reporting Medication Refill.  Triage Disposition: Home Care  Patient/caregiver understands and will follow disposition?: Yes, will follow disposition.  Pt calling to know when his pain medications will be ready. Pt advised [facility] [place] [facility] 10 day [facility]. Pt advised to call back with any further questions/concerns.   Copied from CRM [id number]. Topic: Clinical - Medication Question >> Dec 27, 2023  4:49 PM Tobias L wrote: Reason for CRM: Patient inquiring when he can get the oxycodone  rx. Patient states he no longer wants his wife in charge of his medication.   Advised patient oxycodone  rx was sent to pharmacy on 12/21/23 and should be available for pick up.   Patient states his wife believes he is faking his pain and withholding his medication. Patient requesting assistance with getting his medication. Reason for Disposition  Patient has refills remaining on their prescription  Answer Assessment - Initial Assessment Questions 1. DRUG NAME: What medicine do you need to have refilled?     oxycodone   Protocols used: Medication Refill and Renewal Call-A-AH

## 2023-12-28 ENCOUNTER — Other Ambulatory Visit: Payer: Self-pay | Admitting: Family Medicine

## 2023-12-28 DIAGNOSIS — Z515 Encounter for palliative care: Secondary | ICD-10-CM

## 2023-12-28 DIAGNOSIS — G8929 Other chronic pain: Secondary | ICD-10-CM

## 2023-12-28 MED ORDER — OXYCODONE HCL 15 MG PO TABS: 15.0000 mg | ORAL_TABLET | ORAL | 0 refills | Status: DC | PRN

## 2023-12-28 NOTE — Telephone Encounter (Signed)
 I checked into this.  He picked up rx Oxycodone  on 12/22/23  It was originally ordered for 10 day supply. Next fill date is Monday 01/01/24.  It is possible to pick up 1-2 days early but that is up to the pharmacy.  I will send the new order for pick up 12/30/23 and see if that will work. He can follow up with pharmacy next  [name], DO [facility] 12/28/2023, 10:04 AM

## 2023-12-28 NOTE — Telephone Encounter (Signed)
 Left message for patient to return call OK to advise

## 2023-12-29 ENCOUNTER — Telehealth: Payer: Self-pay

## 2023-12-29 NOTE — Progress Notes (Signed)
 Complex Care Management Note  Care Guide Note 12/29/2023 Name: Tom [name] MRN: [id number] DOB: Aug 14, 1965  Tom [name] is a 58 y.o. year old male who sees Edman Marsa PARAS, DO for primary care. I reached out to Tom [name] by phone today to offer complex care management services.  [name] was given information about Complex Care Management services today including:   The Complex Care Management services include support from the care team which includes your Nurse Care Manager, Clinical Social Worker, or Pharmacist.  The Complex Care Management team is here to help remove barriers to the health concerns and goals most important to you. Complex Care Management services are voluntary, and the patient may decline or stop services at any time by request to their care team member.   Complex Care Management Consent Status: Patient agreed to services and verbal consent obtained.   Follow up plan:  Telephone appointment with complex care management team member scheduled for:  01/08/24 at 1:00 p.m.   Encounter Outcome:  Patient Scheduled  D[facility]  [facility], [facility] Care Management Assistant Direct Dial : [phone]  Fax: [phone]

## 2024-01-01 ENCOUNTER — Other Ambulatory Visit: Payer: Self-pay | Admitting: Family Medicine

## 2024-01-01 DIAGNOSIS — G8929 Other chronic pain: Secondary | ICD-10-CM

## 2024-01-01 DIAGNOSIS — Z515 Encounter for palliative care: Secondary | ICD-10-CM

## 2024-01-01 NOTE — Telephone Encounter (Signed)
 [facility] should already have a refill available for pick up.   Disp Refills Start End   oxyCODONE  (ROXICODONE ) 15 MG immediate release tablet 60 tablet 0 12/30/2023 --   Sig - Route: Take 1 tablet (15 mg total) by mouth every 4 (four) hours as needed for pain. - Oral   Sent to pharmacy as: oxyCODONE  (ROXICODONE ) 15 MG immediate release tablet   Earliest Fill Date: 12/30/2023   Notes to Pharmacy: First fill 12/30/2023   E-Prescribing Status: Receipt confirmed by pharmacy (12/28/2023 10:06 AM EDT)    [name], DO [facility] 01/01/2024, 6:15 PM

## 2024-01-01 NOTE — Telephone Encounter (Signed)
 Copied from CRM [id number]. Topic: Clinical - Medication Refill >> Jan 01, 2024  8:13 AM [name] I wrote: Medication: oxyCODONE  (ROXICODONE ) 15 MG immediate release tablet  Has the patient contacted their pharmacy? Yes, pharm stated patient had to call us   (Agent: If no, request that the patient contact the pharmacy for the refill. If patient does not wish to contact the pharmacy document the reason why and proceed with request.) (Agent: If yes, when and what did the pharmacy advise?)  This is the patient's preferred pharmacy:  [id number] [place] [place] - [address] [place] [address] Phone: [phone] Fax: [phone]  Is this the correct pharmacy for this prescription? Yes If no, delete pharmacy and type the correct one.   Has the prescription been filled recently? No  Is the patient out of the medication? No, patient is almost out   Has the patient been seen for an appointment in the last year OR does the patient have an upcoming appointment? Yes  Can we respond through MyChart? Yes  Agent: Please be advised that Rx refills may take up to 3 business days. We ask that you follow-up with your pharmacy.

## 2024-01-08 ENCOUNTER — Other Ambulatory Visit: Payer: Self-pay

## 2024-01-08 ENCOUNTER — Other Ambulatory Visit: Payer: Self-pay | Admitting: Family Medicine

## 2024-01-08 DIAGNOSIS — G8929 Other chronic pain: Secondary | ICD-10-CM

## 2024-01-08 DIAGNOSIS — Z515 Encounter for palliative care: Secondary | ICD-10-CM

## 2024-01-08 NOTE — Telephone Encounter (Unsigned)
 Copied from CRM [id number]. Topic: Clinical - Medication Refill >> Jan 08, 2024  2:49 PM Fonda T wrote: Medication: oxyCODONE  (ROXICODONE ) 15 MG immediate release tablet  Has the patient contacted their pharmacy? No (Agent: If no, request that the patient contact the pharmacy for the refill. If patient does not wish to contact the pharmacy document the reason why and proceed with request.) (Agent: If yes, when and what did the pharmacy advise?)  This is the patient's preferred pharmacy:  [id number] [place] [place] - [address] [place] [address] Phone: [phone] Fax: [phone]   Is this the correct pharmacy for this prescription? Yes If no, delete pharmacy and type the correct one.   Has the prescription been filled recently? Yes  Is the patient out of the medication? No, 2 days left of medication.  Has the patient been seen for an appointment in the last year OR does the patient have an upcoming appointment? Yes  Can we respond through MyChart? No, patient prefers a phone call with status update, [phone]   Agent: Please be advised that Rx refills may take up to 3 business days. We ask that you follow-up with your pharmacy.

## 2024-01-08 NOTE — Patient Outreach (Signed)
 Complex Care Management   Visit Note  01/08/2024  Name:  Tom [name] MRN: [id number] DOB: 04/26/1966  Situation: Referral received for Complex Care Management related to High Risk. I obtained verbal consent from Patient.  Visit completed with Tom [name]  on the phone.  Main concern is severe pain in right ankle, takes oxycodone  every four hours, this manages his pain for a few hours but pain comes back before the next dose is due.  He has a pain contract with Dr. Eual.  Patient declines going over medications on this contact, states he doesn't like people getting into my business. This RNCM gently discussed reason for call/intentions, and that a requirement to be on care management program is to update the med list and discuss any med concerns he may have, reminded this is a voluntary program and he may unenroll at any time.   He would rather his wife go over meds, she works until 3:30pm daily and will reach out, provided my contact info.   Background:   Past Medical History:  Diagnosis Date   Arthritis    Asthma    COPD (chronic obstructive pulmonary disease) (HCC)    History of ankle fusion    right   Hyperlipidemia    Hypertension    Hypokalemia 12/04/2022   Hypomagnesemia 12/04/2022   Hypomagnesemia 12/04/2022   Osteoporosis     Assessment: Patient Reported Symptoms:  Cognitive Cognitive Status: Struggling with memory recall Cognitive/Intellectual Conditions Management [RPT]: Other Other: History of Hepatic encephalopathy      Neurological Neurological Review of Symptoms: No symptoms reported    HEENT HEENT Symptoms Reported: Not assessed      Cardiovascular Cardiovascular Symptoms Reported: Other: Other Cardiovascular Symptoms: Swelling in right ankle.    Respiratory Respiratory Symptoms Reported: No symptoms reported Additional Respiratory Details: States he quit smoking seven years ago, congratulated patient on this accomplishment.     Endocrine Endocrine Symptoms Reported: No symptoms reported    Gastrointestinal Gastrointestinal Symptoms Reported: No symptoms reported      Genitourinary Genitourinary Symptoms Reported: No symptoms reported    Integumentary Integumentary Symptoms Reported: No symptoms reported    Musculoskeletal Musculoskelatal Symptoms Reviewed: Unsteady gait Additional Musculoskeletal Details: Uses a cane        Psychosocial Psychosocial Symptoms Reported: Report of significant loss, deaths, abandonment, traumatic incidents Additional Psychological Details: States he has history of hospitalization with a sedation (coma) period of 70 days.  He isn't able to work due to severe right ankle pain.  His joy is getting to play with his four grandchildren.  He is suspicious of this RNCM's intentions on the call, repeating, I don't like anyone getting into my business, I just want to talk to someone but not about my meds.  He did offer info that he has quit smoking for seven years, two years since he drank alcohol. Behavioral Health Self-Management Outcome: 3 (uncertain)          05/16/2023    5:40 PM  Depression screen PHQ 2/9  Decreased Interest 0  Down, Depressed, Hopeless 0  PHQ - 2 Score 0  Altered sleeping 0  Tired, decreased energy 0  Change in appetite 0  Feeling bad or failure about yourself  0  Trouble concentrating 0  Moving slowly or fidgety/restless 0  Suicidal thoughts 0  PHQ-9 Score 0  Difficult doing work/chores Not difficult at all    There were no vitals filed for this visit.  Medications Reviewed Today   Medications  were not reviewed in this encounter     Recommendation:   PCP Follow-up -  he will call PCP for appointment due to what he feels is increased difficulty with memory recall.   Follow Up Plan:   Telephone follow-up in 1 week - made plans to call back next week if this RNCM doesn't hear from wife.   Santana Stamp BSN, CCM [place]  VBCI Population  Health RN Care Manager Direct Dial : [phone]  Fax: [phone]

## 2024-01-08 NOTE — Patient Instructions (Signed)
 Visit Information  Tom [name] was given information about Medicaid Managed Care team care coordination services as a part of their Healthy [employer] Medicaid benefit. Tom [name] verbally consented to engagement with the [facility] Managed Care team.   If you are experiencing a medical emergency, please call 911 or report to your local emergency department or urgent care.   If you have a non-emergency medical problem during routine business hours, please contact your provider's office and ask to speak with a nurse.   For questions related to your Healthy [facility] health plan, please call: [phone] or visit the homepage here: [email or web address]  If you would like to schedule transportation through your Healthy [facility] plan, please call the following number at least 2 days in advance of your appointment: [phone]  For information about your ride after you set it up, call Ride Assist at [phone]. Use this number to activate a Will Call pickup, or if your transportation is late for a scheduled pickup. Use this number, too, if you need to make a change or cancel a previously scheduled reservation.  If you need transportation services right away, call [phone]. The after-hours call center is staffed 24 hours to handle ride assistance and urgent reservation requests (including discharges) 365 days a year. Urgent trips include sick visits, hospital discharge requests and life-sustaining treatment.  Call the [facility] Line at [phone], at any time, 24 hours a day, 7 days a week. If you are in danger or need immediate medical attention call 911.  If you would like help to quit smoking, call 1-800-QUIT-NOW ((765)849-5796) OR Espaol: 1-855-Djelo-Ya (8-144-664-6430) o para ms informacin haga clic aqu or Text READY to 799-599 to register via text  Tom [name] - following are the goals we discussed in your  visit today:   Goals Addressed   None       The patient verbalized understanding of instructions, educational materials, and care plan provided today and DECLINED offer to receive copy of patient instructions, educational materials, and care plan.   Telephone follow up appointment with Managed Medicaid care management team member scheduled qnm:[id number], July 28th at 4;15pm.   Tom [name] BSN, CCM [place]  VBCI Population Health RN Care Manager Direct Dial : [phone]  Fax: [phone]   Following is a copy of your plan of care:  There are no care plans that you recently modified to display for this patient.

## 2024-01-09 ENCOUNTER — Other Ambulatory Visit: Payer: Self-pay | Admitting: Family Medicine

## 2024-01-09 ENCOUNTER — Ambulatory Visit: Payer: Self-pay

## 2024-01-09 ENCOUNTER — Telehealth: Payer: Self-pay

## 2024-01-09 DIAGNOSIS — G8929 Other chronic pain: Secondary | ICD-10-CM

## 2024-01-09 DIAGNOSIS — Z515 Encounter for palliative care: Secondary | ICD-10-CM

## 2024-01-09 DIAGNOSIS — K7031 Alcoholic cirrhosis of liver with ascites: Secondary | ICD-10-CM

## 2024-01-09 MED ORDER — LACTULOSE 10 GM/15ML PO SOLN: 45.0000 g | Freq: Two times a day (BID) | ORAL | 2 refills | Status: AC

## 2024-01-09 MED ORDER — OXYCODONE HCL 15 MG PO TABS: 15.0000 mg | ORAL_TABLET | ORAL | 0 refills | Status: DC | PRN

## 2024-01-09 NOTE — Telephone Encounter (Signed)
 FYI Only or Action Required?: FYI only for provider.  Patient was last seen in primary care on 09/13/2023 by Edman Marsa PARAS, DO.  Called Nurse Triage reporting Leg Pain.  Symptoms began several years ago.  Interventions attempted: Prescription medications:  SABRA  Symptoms are: stable. Pt. Following up on requests for pain medication. Declines OV.  Triage Disposition: See PCP Within 2 Weeks  Patient/caregiver understands and will follow disposition?: No, refuses disposition    Copied from CRM [id number]. Topic: Clinical - Red Word Triage >> Jan 09, 2024 10:19 AM Antwanette L wrote: Red Word that prompted transfer to Nurse Triage: patient right leg is jumping around and its causing some pain Reason for Disposition  Leg pain or muscle cramp is a chronic symptom (recurrent or ongoing AND present > 4 weeks)  Answer Assessment - Initial Assessment Questions 1. ONSET: When did the pain start?      Several years 2. LOCATION: Where is the pain located?      Right leg jumping, painful 3. PAIN: How bad is the pain?    (Scale 1-10; or mild, moderate, severe)     severe 4. WORK OR EXERCISE: Has there been any recent work or exercise that involved this part of the body?      no 5. CAUSE: What do you think is causing the leg pain?     Old surgery 6. OTHER SYMPTOMS: Do you have any other symptoms? (e.g., chest pain, back pain, breathing difficulty, swelling, rash, fever, numbness, weakness)     no 7. PREGNANCY: Is there any chance you are pregnant? When was your last menstrual period?     N/a  Protocols used: Leg Pain-A-AH

## 2024-01-09 NOTE — Telephone Encounter (Unsigned)
 Copied from CRM [id number]. Topic: Clinical - Medication Refill >> Jan 09, 2024 10:14 AM Antwanette L wrote: Medication: cefadroxil  (DURICEF) 500 MG capsule  Has the patient contacted their pharmacy? No   This is the patient's preferred pharmacy:  [id number] [place] [place] - [address] [place] [address] Phone: [phone] Fax: [phone]  Is this the correct pharmacy for this prescription? Yes If no,   Has the prescription been filled recently? No. Last refilled was on 07/08/23  Is the patient out of the medication? Yes  Has the patient been seen for an appointment in the last year OR does the patient have an upcoming appointment? Yes. Last ov with Dr. Edman was on 09/13/23  Can we respond through MyChart? No. Please contact the patient by phone at [phone]  Agent: Please be advised that Rx refills may take up to 3 business days. We ask that you follow-up with your pharmacy.

## 2024-01-09 NOTE — Telephone Encounter (Addendum)
 Rx sent for oxycodone  to pharmacy. He may not be able to fill it today. It could be scheduled by pharmacy for 10 days from his last fill.  Also there is a phone call about a refill request for Cefadroxil  [facility]). My understanding is that he should be OFF of this medication. It was used previously for cellulitis but it is not a long term medication.  [name], DO [facility] 01/09/2024, 12:16 PM

## 2024-01-09 NOTE — Telephone Encounter (Signed)
 I don't normally manage ammonia in the office. It is something that is checked by hospital. I can check it if we have a follow-up, but I don't feel comfortable just checking labs without any type of follow-up or apt scheduled if that makes sense. Also if he has significant confusion, checking a lab then waiting over 24 hours for results is not ideal. That should be a more urgent or emergent thing to get checked.  Do we have any availability before the week ends? I would say we could check it and see him if it allows otherwise, if they are significantly concerned about confusion - he should go get it checked at ED quicker.  Otherwise they should try the lactulose  to see if it solves the issue.  [name], DO [facility] 01/09/2024, 1:57 PM

## 2024-01-09 NOTE — Telephone Encounter (Signed)
 Patient notified to take lactulose  instead of Ex lax. Understands that if he has worsening confusion he should go to the hospital.

## 2024-01-09 NOTE — Telephone Encounter (Signed)
 Copied from CRM [id number]. Topic: Clinical - Medication Refill >> Jan 09, 2024 10:14 AM Tom [name] wrote: Patient is calling to get an update on Tom [name] refill for oxycodone . I did let the patient know that the request was sent on 7/21 and refills can up to 3 business days. The patient is requesting a call back at [phone]

## 2024-01-09 NOTE — Telephone Encounter (Signed)
 Patent's sister had initiated a refill request for antibiotic, duracef, which prompted phone call for triage. Only requested because it had not been filled recently.  Patient states that he takes it for his liver failure. No new symptoms

## 2024-01-09 NOTE — Telephone Encounter (Signed)
 Patient notified rx for oxycodone  called in, he will have to check with the pharmacy about receiving the refill.

## 2024-01-09 NOTE — Telephone Encounter (Signed)
 FYI Only or Action Required?: FYI only for provider.  Patient was last seen in primary care on 09/13/2023 by Tom Marsa PARAS, DO.  Called Nurse Triage reporting delerium.  Symptoms began several years ago.  Interventions attempted: OTC medications: exlax.  Symptoms are: unchanged.  Triage Disposition: See PCP Within 2 Weeks  Patient/caregiver understands and will follow disposition?: Unsure  Reason for Disposition  [1] Longstanding confusion (e.g., dementia, stroke) AND [2] NO worsening or change  Answer Assessment - Initial Assessment Questions Is spoke to both the patient and his sister.  Patient is oriented to person and place, but states that the date is July 14 or 15 Patient states that he was told that he could take exlax instead of lactulose    Patent's sister had initiated a refill request for antibiotic, duracef, which prompted phone call for triage. Only requested because it had not been filled recently.  Patient states that he takes it for his liver failure   1. LEVEL OF CONSCIOUSNESS: How are they (the patient) acting right now? (e.g., alert-oriented, confused, lethargic, stuporous, comatose)     Patient is alert 2. ONSET: When did the confusion start?  (e.g., minutes, hours, days)     Patient's sister states that he is confused at his baseline  5. NARCOTIC MEDICINES: Have they been receiving any narcotic medications? (e.g., morphine , Vicodin)     oxycodone  6. CAUSE: What do you think is causing the confusion?      Cirrhosis of the liver  Protocols used: Confusion - Delirium-A-AH

## 2024-01-09 NOTE — Addendum Note (Signed)
 Addended by: EDMAN MARSA PARAS on: 01/09/2024 12:17 PM   Modules accepted: Orders

## 2024-01-09 NOTE — Telephone Encounter (Signed)
 Copied from CRM [id number]. Topic: Clinical - Request for Lab/Test Order >> Jan 09, 2024  1:30 PM DeAngela L wrote: Reason for CRM: patient would like to ask if Dr Edman could schedule him a lab appt to get a check up, he feels he has high ammonia in his blood he says he is more confused  (Refused NT call) Pt num [phone] (M)

## 2024-01-09 NOTE — Telephone Encounter (Signed)
 Patient notified that he should go to the ED to be treated, they can get lab results in quicker and treat him there. Verbal understanding stated he may go later today or tomorrow when he can get a ride.

## 2024-01-09 NOTE — Telephone Encounter (Signed)
 There is another phone call refill request already on the chart. It seems we received multiple requests today.  This request has different information. The other one was refill for pain med.  The concern here is that he should be on Lactulose . Ex-lax is not the same and he should not use Ex-lax instead of lactulose .  Too high ammonia levels can cause confusion. He should resume Lactulose . If he has worsening confusion, hospital evaluation and lab check is an option to evaluate his confusion if it worsens.  He does not need Duracef Cefaxdroxil either. That is no longer prescribed at this time. It is not a long term medication.  [name], DO [facility] 01/09/2024, 12:19 PM

## 2024-01-10 ENCOUNTER — Other Ambulatory Visit (HOSPITAL_COMMUNITY): Payer: Self-pay | Admitting: Family Medicine

## 2024-01-10 ENCOUNTER — Telehealth: Payer: Self-pay

## 2024-01-10 DIAGNOSIS — K7460 Unspecified cirrhosis of liver: Secondary | ICD-10-CM

## 2024-01-10 NOTE — Telephone Encounter (Signed)
 I called [facility] Interventional Radiology Dept. They will place future Standing Orders for him to get Paracentesis at hospital. They will call him to schedule the first procedure.  Please notify patient that we are working on the orders and the hospital will call today or tomorrow to set up the paracentesis drainage. I do not know exactly when it will take place.  If he has an emergent need for drainage of fluid, due to significant symptoms fever chills or confusion worsening, he should go to ED for immediate evaluation, they can arrange drainage at that time.  Otherwise, if he is okay to wait to be called and scheduled they can setup a precise time to do the drainage and he can setup more in the future.  [name], DO [facility] 01/10/2024, 2:17 PM

## 2024-01-10 NOTE — Telephone Encounter (Signed)
 Copied from CRM [id number]. Topic: Clinical - Medical Advice >> Jan 10, 2024 10:56 AM Carlatta H wrote: Reason for CRM: Please call patient about having fluid drained from his body due liver//

## 2024-01-10 NOTE — Telephone Encounter (Signed)
 Spoke with patient, stated he was on the phone with someone to get scheduled. Explained to him he needed to talk with her and get an appointment scheduled.

## 2024-01-11 ENCOUNTER — Ambulatory Visit
Admission: RE | Admit: 2024-01-11 | Discharge: 2024-01-11 | Disposition: A | Discharge status: Home / Self Care | Source: Ambulatory Visit | Attending: Family Medicine | Admitting: Family Medicine

## 2024-01-11 ENCOUNTER — Other Ambulatory Visit (HOSPITAL_COMMUNITY): Payer: Self-pay | Admitting: Family Medicine

## 2024-01-11 ENCOUNTER — Telehealth: Payer: Self-pay

## 2024-01-11 DIAGNOSIS — K7460 Unspecified cirrhosis of liver: Secondary | ICD-10-CM | POA: Diagnosis not present

## 2024-01-11 DIAGNOSIS — R188 Other ascites: Secondary | ICD-10-CM | POA: Insufficient documentation

## 2024-01-11 NOTE — Telephone Encounter (Signed)
 Requested medications are due for refill today.  no  Requested medications are on the active medications list.  no  Last refill. 07/12/2023  Future visit scheduled.   no  Notes to clinic.   Patent's sister had initiated a refill request for antibiotic, duracef, which prompted phone call for triage. Only requested because it had not been filled recently.  Patient states that he takes it for his liver failure. No new symptoms      Requested Prescriptions  Pending Prescriptions Disp Refills   cefadroxil  (DURICEF) 500 MG capsule      Sig: Take 1 capsule (500 mg total) by mouth 2 (two) times daily.     Off-Protocol Failed - 01/11/2024 12:57 PM      Failed - Medication not assigned to a protocol, review manually.      Passed - Valid encounter within last 12 months    Recent Outpatient Visits           4 months ago Edema of right lower extremity   [facility] [place], Marsa PARAS, DO   5 months ago Alcoholic cirrhosis of liver with ascites [facility])   [facility], Marsa PARAS, [place]

## 2024-01-11 NOTE — Telephone Encounter (Signed)
 Copied from CRM [id number]. Topic: Clinical - Request for Lab/Test Order >> Jan 11, 2024  8:31 AM Charlet HERO wrote: Reason for CRM: Patient is calling to have a lab for ammonia levels to be checked sent to the lab. He is currently in imaging and is feeling a little confused and states that this is the way he felt before when the levels were high.

## 2024-01-15 ENCOUNTER — Telehealth: Payer: Self-pay

## 2024-01-16 ENCOUNTER — Telehealth: Payer: Self-pay

## 2024-01-16 DIAGNOSIS — Z79899 Other long term (current) drug therapy: Secondary | ICD-10-CM | POA: Diagnosis not present

## 2024-01-16 DIAGNOSIS — J449 Chronic obstructive pulmonary disease, unspecified: Secondary | ICD-10-CM | POA: Diagnosis not present

## 2024-01-16 DIAGNOSIS — E785 Hyperlipidemia, unspecified: Secondary | ICD-10-CM | POA: Diagnosis not present

## 2024-01-16 DIAGNOSIS — F1090 Alcohol use, unspecified, uncomplicated: Secondary | ICD-10-CM | POA: Diagnosis not present

## 2024-01-16 DIAGNOSIS — K7682 Hepatic encephalopathy: Secondary | ICD-10-CM | POA: Diagnosis not present

## 2024-01-16 DIAGNOSIS — M19071 Primary osteoarthritis, right ankle and foot: Secondary | ICD-10-CM | POA: Diagnosis not present

## 2024-01-16 DIAGNOSIS — R600 Localized edema: Secondary | ICD-10-CM | POA: Diagnosis not present

## 2024-01-16 DIAGNOSIS — J42 Unspecified chronic bronchitis: Secondary | ICD-10-CM | POA: Diagnosis not present

## 2024-01-16 DIAGNOSIS — R410 Disorientation, unspecified: Secondary | ICD-10-CM | POA: Diagnosis not present

## 2024-01-16 DIAGNOSIS — R1032 Left lower quadrant pain: Secondary | ICD-10-CM | POA: Diagnosis not present

## 2024-01-16 DIAGNOSIS — K219 Gastro-esophageal reflux disease without esophagitis: Secondary | ICD-10-CM | POA: Diagnosis not present

## 2024-01-16 DIAGNOSIS — M86671 Other chronic osteomyelitis, right ankle and foot: Secondary | ICD-10-CM | POA: Diagnosis not present

## 2024-01-16 DIAGNOSIS — M8660 Other chronic osteomyelitis, unspecified site: Secondary | ICD-10-CM | POA: Diagnosis not present

## 2024-01-16 DIAGNOSIS — I10 Essential (primary) hypertension: Secondary | ICD-10-CM | POA: Diagnosis not present

## 2024-01-16 DIAGNOSIS — M7989 Other specified soft tissue disorders: Secondary | ICD-10-CM | POA: Diagnosis not present

## 2024-01-16 DIAGNOSIS — I2510 Atherosclerotic heart disease of native coronary artery without angina pectoris: Secondary | ICD-10-CM | POA: Diagnosis not present

## 2024-01-16 DIAGNOSIS — Z87891 Personal history of nicotine dependence: Secondary | ICD-10-CM | POA: Diagnosis not present

## 2024-01-16 DIAGNOSIS — M25571 Pain in right ankle and joints of right foot: Secondary | ICD-10-CM | POA: Diagnosis not present

## 2024-01-16 DIAGNOSIS — R918 Other nonspecific abnormal finding of lung field: Secondary | ICD-10-CM | POA: Diagnosis not present

## 2024-01-16 DIAGNOSIS — D649 Anemia, unspecified: Secondary | ICD-10-CM | POA: Diagnosis not present

## 2024-01-16 DIAGNOSIS — I8500 Esophageal varices without bleeding: Secondary | ICD-10-CM | POA: Diagnosis not present

## 2024-01-16 DIAGNOSIS — D538 Other specified nutritional anemias: Secondary | ICD-10-CM | POA: Diagnosis not present

## 2024-01-17 DIAGNOSIS — R600 Localized edema: Secondary | ICD-10-CM | POA: Diagnosis not present

## 2024-01-17 DIAGNOSIS — K7469 Other cirrhosis of liver: Secondary | ICD-10-CM | POA: Diagnosis not present

## 2024-01-17 DIAGNOSIS — M8668 Other chronic osteomyelitis, other site: Secondary | ICD-10-CM | POA: Diagnosis not present

## 2024-01-17 DIAGNOSIS — Z472 Encounter for removal of internal fixation device: Secondary | ICD-10-CM | POA: Diagnosis not present

## 2024-01-17 DIAGNOSIS — R161 Splenomegaly, not elsewhere classified: Secondary | ICD-10-CM | POA: Diagnosis not present

## 2024-01-17 DIAGNOSIS — K7290 Hepatic failure, unspecified without coma: Secondary | ICD-10-CM | POA: Diagnosis not present

## 2024-01-17 DIAGNOSIS — I8500 Esophageal varices without bleeding: Secondary | ICD-10-CM | POA: Diagnosis not present

## 2024-01-17 DIAGNOSIS — M25571 Pain in right ankle and joints of right foot: Secondary | ICD-10-CM | POA: Diagnosis not present

## 2024-01-17 DIAGNOSIS — R0989 Other specified symptoms and signs involving the circulatory and respiratory systems: Secondary | ICD-10-CM | POA: Diagnosis not present

## 2024-01-17 DIAGNOSIS — K838 Other specified diseases of biliary tract: Secondary | ICD-10-CM | POA: Diagnosis not present

## 2024-01-17 DIAGNOSIS — R188 Other ascites: Secondary | ICD-10-CM | POA: Diagnosis not present

## 2024-01-17 DIAGNOSIS — R4182 Altered mental status, unspecified: Secondary | ICD-10-CM | POA: Diagnosis not present

## 2024-01-18 ENCOUNTER — Other Ambulatory Visit: Payer: Self-pay | Admitting: Family Medicine

## 2024-01-18 DIAGNOSIS — D538 Other specified nutritional anemias: Secondary | ICD-10-CM | POA: Diagnosis not present

## 2024-01-18 DIAGNOSIS — M19071 Primary osteoarthritis, right ankle and foot: Secondary | ICD-10-CM | POA: Diagnosis not present

## 2024-01-18 DIAGNOSIS — G8929 Other chronic pain: Secondary | ICD-10-CM

## 2024-01-18 DIAGNOSIS — Z515 Encounter for palliative care: Secondary | ICD-10-CM

## 2024-01-18 DIAGNOSIS — M25571 Pain in right ankle and joints of right foot: Secondary | ICD-10-CM | POA: Diagnosis not present

## 2024-01-18 DIAGNOSIS — K7682 Hepatic encephalopathy: Secondary | ICD-10-CM | POA: Diagnosis not present

## 2024-01-18 DIAGNOSIS — K219 Gastro-esophageal reflux disease without esophagitis: Secondary | ICD-10-CM | POA: Diagnosis not present

## 2024-01-18 DIAGNOSIS — R600 Localized edema: Secondary | ICD-10-CM | POA: Diagnosis not present

## 2024-01-18 DIAGNOSIS — R1032 Left lower quadrant pain: Secondary | ICD-10-CM | POA: Diagnosis not present

## 2024-01-18 DIAGNOSIS — M8660 Other chronic osteomyelitis, unspecified site: Secondary | ICD-10-CM | POA: Diagnosis not present

## 2024-01-18 DIAGNOSIS — M86671 Other chronic osteomyelitis, right ankle and foot: Secondary | ICD-10-CM | POA: Diagnosis not present

## 2024-01-18 DIAGNOSIS — J42 Unspecified chronic bronchitis: Secondary | ICD-10-CM | POA: Diagnosis not present

## 2024-01-18 NOTE — Telephone Encounter (Unsigned)
 Copied from CRM [id number]. Topic: Clinical - Medication Refill >> Jan 18, 2024  8:25 AM Carlatta H wrote: Medication: oxyCODONE  (ROXICODONE ) 15 MG  Has the patient contacted their pharmacy? No (Agent: If no, request that the patient contact the pharmacy for the refill. If patient does not wish to contact the pharmacy document the reason why and proceed with request.) (Agent: If yes, when and what did the pharmacy advise?)  This is the patient's preferred pharmacy:  [id number] [place], [place] - [address] [place] [address] Phone: [phone] Fax: [phone]    Is this the correct pharmacy for this prescription? Yes If no, delete pharmacy and type the correct one.   Has the prescription been filled recently? No  Is the patient out of the medication? Yes  Has the patient been seen for an appointment in the last year OR does the patient have an upcoming appointment? No  Can we respond through MyChart? Yes  Agent: Please be advised that Rx refills may take up to 3 business days. We ask that you follow-up with your pharmacy.

## 2024-01-18 NOTE — Telephone Encounter (Signed)
 Requested medications are due for refill today.  yes  Requested medications are on the active medications list.  yes  Last refill. 01/09/2024 #60 0 rf  Future visit scheduled.   no  Notes to clinic.  Refill not delegated.    Requested Prescriptions  Pending Prescriptions Disp Refills   oxyCODONE  (ROXICODONE ) 15 MG immediate release tablet 60 tablet 0    Sig: Take 1 tablet (15 mg total) by mouth every 4 (four) hours as needed for pain.     Not Delegated - Analgesics:  Opioid Agonists Failed - 01/18/2024  5:16 PM      Failed - This refill cannot be delegated      Failed - Urine Drug Screen completed in last 360 days      Failed - Valid encounter within last 3 months    Recent Outpatient Visits           4 months ago Edema of right lower extremity   [facility] [place], Marsa PARAS, DO   5 months ago Alcoholic cirrhosis of liver with ascites [facility])   [facility] [place], Marsa PARAS, [place]

## 2024-01-19 ENCOUNTER — Telehealth: Payer: Self-pay

## 2024-01-19 MED ORDER — OXYCODONE HCL 15 MG PO TABS: 15.0000 mg | ORAL_TABLET | ORAL | 0 refills | Status: DC | PRN

## 2024-01-19 NOTE — Telephone Encounter (Signed)
 Filled by PCP today

## 2024-01-19 NOTE — Telephone Encounter (Signed)
 Copied from CRM [id number]. Topic: Clinical - Medication Question >> Jan 18, 2024  4:46 PM [name] wrote: Reason for CRM: Patient would like to know when his oxyCODONE  (ROXICODONE ) 15 MG immediate release tablet would be ready. Informed him that it may take up to 3 business days for refills  Callback #: [phone]

## 2024-01-19 NOTE — Transitions of Care (Post Inpatient/ED Visit) (Signed)
   01/19/2024  Name: [name] MRN: [id number] DOB: June 08, 1966  Today's TOC FU Call Status: Today's TOC FU Call Status:: Unsuccessful Call (1st Attempt) Unsuccessful Call (1st Attempt) Date: 01/19/24  Attempted to reach the patient regarding the most recent Inpatient/ED visit. Patient does have Care Everywhere access restricted and not authorized to view.   Follow Up Plan: Additional outreach attempts will be made to reach the patient to complete the Transitions of Care (Post Inpatient/ED visit) call.    Bing Edison MSN, RN RN Case Sales executive Health  [facility] Office Hours M-F [phone] Direct Dial : [phone] Main Phone [phone]  Fax: [phone] .com

## 2024-01-22 ENCOUNTER — Telehealth: Payer: Self-pay

## 2024-01-22 NOTE — Transitions of Care (Post Inpatient/ED Visit) (Addendum)
   01/22/2024  Name: Tom [name] MRN: [id number] DOB: 04-Apr-1966  Today's TOC FU Call Status: Today's TOC FU Call Status:: Unsuccessful Call (2nd Attempt) Unsuccessful Call (1st Attempt) Date: 01/19/24 Unsuccessful Call (2nd Attempt) Date: 01/22/24  Attempted to reach the patient regarding the most recent Inpatient/ED visit.  Note: Patient was being followed by CCM RN right before admission. On first outreach attempt, RN CM sent CCM RN DC Summary and notification of discharge. CCM RN CM responded to affirm receipt of notification and cc'd chart.   Follow Up Plan: Additional outreach attempts will be made to reach the patient to complete the Transitions of Care (Post Inpatient/ED visit) call.    Bing Edison MSN, RN RN Case Sales executive Health  [facility] Office Hours M-F [phone] Direct Dial : [phone] Main Phone [phone]  Fax: [phone] [email or web address]

## 2024-01-24 ENCOUNTER — Other Ambulatory Visit: Payer: Self-pay | Admitting: Family Medicine

## 2024-01-24 DIAGNOSIS — M25471 Effusion, right ankle: Secondary | ICD-10-CM | POA: Diagnosis not present

## 2024-01-24 DIAGNOSIS — K219 Gastro-esophageal reflux disease without esophagitis: Secondary | ICD-10-CM | POA: Diagnosis not present

## 2024-01-24 DIAGNOSIS — M00871 Arthritis due to other bacteria, right ankle and foot: Secondary | ICD-10-CM | POA: Diagnosis not present

## 2024-01-24 DIAGNOSIS — M86671 Other chronic osteomyelitis, right ankle and foot: Secondary | ICD-10-CM | POA: Diagnosis not present

## 2024-01-24 DIAGNOSIS — M86161 Other acute osteomyelitis, right tibia and fibula: Secondary | ICD-10-CM | POA: Diagnosis not present

## 2024-01-24 DIAGNOSIS — I8510 Secondary esophageal varices without bleeding: Secondary | ICD-10-CM | POA: Diagnosis not present

## 2024-01-24 DIAGNOSIS — Z79899 Other long term (current) drug therapy: Secondary | ICD-10-CM | POA: Diagnosis not present

## 2024-01-24 DIAGNOSIS — M79671 Pain in right foot: Secondary | ICD-10-CM | POA: Diagnosis not present

## 2024-01-24 DIAGNOSIS — E785 Hyperlipidemia, unspecified: Secondary | ICD-10-CM | POA: Diagnosis not present

## 2024-01-24 DIAGNOSIS — Z87891 Personal history of nicotine dependence: Secondary | ICD-10-CM | POA: Diagnosis not present

## 2024-01-24 DIAGNOSIS — M25571 Pain in right ankle and joints of right foot: Secondary | ICD-10-CM | POA: Diagnosis not present

## 2024-01-24 DIAGNOSIS — J449 Chronic obstructive pulmonary disease, unspecified: Secondary | ICD-10-CM | POA: Diagnosis not present

## 2024-01-24 DIAGNOSIS — R609 Edema, unspecified: Secondary | ICD-10-CM | POA: Diagnosis not present

## 2024-01-24 DIAGNOSIS — F1090 Alcohol use, unspecified, uncomplicated: Secondary | ICD-10-CM | POA: Diagnosis not present

## 2024-01-24 DIAGNOSIS — Z7722 Contact with and (suspected) exposure to environmental tobacco smoke (acute) (chronic): Secondary | ICD-10-CM | POA: Diagnosis not present

## 2024-01-24 DIAGNOSIS — Z472 Encounter for removal of internal fixation device: Secondary | ICD-10-CM | POA: Diagnosis not present

## 2024-01-24 DIAGNOSIS — I2510 Atherosclerotic heart disease of native coronary artery without angina pectoris: Secondary | ICD-10-CM | POA: Diagnosis not present

## 2024-01-24 DIAGNOSIS — R600 Localized edema: Secondary | ICD-10-CM | POA: Diagnosis not present

## 2024-01-24 DIAGNOSIS — Z515 Encounter for palliative care: Secondary | ICD-10-CM

## 2024-01-24 DIAGNOSIS — J984 Other disorders of lung: Secondary | ICD-10-CM | POA: Diagnosis not present

## 2024-01-24 DIAGNOSIS — E876 Hypokalemia: Secondary | ICD-10-CM | POA: Diagnosis not present

## 2024-01-24 DIAGNOSIS — D638 Anemia in other chronic diseases classified elsewhere: Secondary | ICD-10-CM | POA: Diagnosis not present

## 2024-01-24 DIAGNOSIS — M86171 Other acute osteomyelitis, right ankle and foot: Secondary | ICD-10-CM | POA: Diagnosis not present

## 2024-01-24 DIAGNOSIS — Z66 Do not resuscitate: Secondary | ICD-10-CM | POA: Diagnosis not present

## 2024-01-24 DIAGNOSIS — M7989 Other specified soft tissue disorders: Secondary | ICD-10-CM | POA: Diagnosis not present

## 2024-01-24 DIAGNOSIS — B3789 Other sites of candidiasis: Secondary | ICD-10-CM | POA: Diagnosis not present

## 2024-01-24 DIAGNOSIS — I10 Essential (primary) hypertension: Secondary | ICD-10-CM | POA: Diagnosis not present

## 2024-01-24 DIAGNOSIS — G8929 Other chronic pain: Secondary | ICD-10-CM

## 2024-01-24 DIAGNOSIS — M14679 Charcot's joint, unspecified ankle and foot: Secondary | ICD-10-CM | POA: Diagnosis not present

## 2024-01-24 DIAGNOSIS — M8619 Other acute osteomyelitis, multiple sites: Secondary | ICD-10-CM | POA: Diagnosis not present

## 2024-01-24 NOTE — Telephone Encounter (Signed)
 01/19/24 60 tabs/0 RF (10 day)     Copied from CRM #[id number]. Topic: Clinical - Prescription Issue >> Jan 24, 2024 10:35 AM Annabella S wrote: Reason for CRM: oxyCODONE  (ROXICODONE ) 15 MG immediate release tablet [[id number]]   Patient is asking for the next refill day please follow up with patient

## 2024-01-25 ENCOUNTER — Telehealth: Payer: Self-pay

## 2024-01-25 ENCOUNTER — Other Ambulatory Visit: Payer: Self-pay | Admitting: Family Medicine

## 2024-01-25 DIAGNOSIS — Z515 Encounter for palliative care: Secondary | ICD-10-CM

## 2024-01-25 DIAGNOSIS — G8929 Other chronic pain: Secondary | ICD-10-CM

## 2024-01-25 MED ORDER — OXYCODONE HCL 15 MG PO TABS: 15.0000 mg | ORAL_TABLET | ORAL | 0 refills | Status: DC | PRN

## 2024-01-25 NOTE — Telephone Encounter (Signed)
 Please contact patient.  I have sent a refill to his pharmacy for first fill tomorrow on Friday 8/8. It was last filled on Friday 01/19/24. The order was intended for about 10 days, so Friday will be about as early as it possibly can be filled. He will need to talk to the pharmacy to confirm when they can have it ready.  I understand about the future plans for his specialist to do the amputation. They will most likely be managing his pain medication regarding the surgery or they can advise use on recommendations to assist with pain medication at that time.  [name], DO [facility] 01/25/2024, 1:32 PM

## 2024-01-25 NOTE — Telephone Encounter (Signed)
 Copied from CRM [id number]. Topic: General - Other >> Jan 25, 2024 11:21 AM Mia F wrote: Reason for CRM: Pt says he was just seen orthopedic on Monday and he was told that his foot will be getting amputated. Put says he is in very bad pain and calling to get a rx refill on the oxyCODONE  (ROXICODONE ) 15 MG immediate release tablet (that refill request has been submitted). He says he has been taking the medication as needed because he thought that is what he was supposed to do. PT says he has an appt coming up next week to discuss the surgery and his surgery should be some time after 8/15. He says he need the rx for the surgery. He asked to inform Dr MARLA. Of the upcoming amputation.

## 2024-01-25 NOTE — Telephone Encounter (Signed)
 Spoke with patient, he understands he will have to check with pharmacy about getting this filled tomorrow.

## 2024-01-25 NOTE — Addendum Note (Signed)
 Addended by: EDMAN MARSA PARAS on: 01/25/2024 01:32 PM   Modules accepted: Orders

## 2024-01-25 NOTE — Telephone Encounter (Unsigned)
 Copied from CRM #[id number]. Topic: Clinical - Medication Refill >> Jan 25, 2024 11:18 AM Mia F wrote: Medication: oxyCODONE  (ROXICODONE ) 15 MG immediate release tablet   Has the patient contacted their pharmacy? No (Agent: If no, request that the patient contact the pharmacy for the refill. If patient does not wish to contact the pharmacy document the reason why and proceed with request.) (Agent: If yes, when and what did the pharmacy advise?)  This is the patient's preferred pharmacy:  [id number] [place], [place] - [address] [place] [address] Phone: [phone] Fax: [phone]   Is this the correct pharmacy for this prescription? Yes If no, delete pharmacy and type the correct one.   Has the prescription been filled recently? Yes  Is the patient out of the medication? Yes  Has the patient been seen for an appointment in the last year OR does the patient have an upcoming appointment? Yes  Can we respond through MyChart? No  Agent: Please be advised that Rx refills may take up to 3 business days. We ask that you follow-up with your pharmacy.

## 2024-01-26 DIAGNOSIS — M8618 Other acute osteomyelitis, other site: Secondary | ICD-10-CM | POA: Diagnosis not present

## 2024-01-26 DIAGNOSIS — E876 Hypokalemia: Secondary | ICD-10-CM | POA: Diagnosis not present

## 2024-01-26 DIAGNOSIS — M0080 Arthritis due to other bacteria, unspecified joint: Secondary | ICD-10-CM | POA: Diagnosis not present

## 2024-01-26 DIAGNOSIS — M25471 Effusion, right ankle: Secondary | ICD-10-CM | POA: Diagnosis not present

## 2024-01-26 DIAGNOSIS — J449 Chronic obstructive pulmonary disease, unspecified: Secondary | ICD-10-CM | POA: Diagnosis not present

## 2024-01-26 DIAGNOSIS — Z79899 Other long term (current) drug therapy: Secondary | ICD-10-CM | POA: Diagnosis not present

## 2024-01-26 DIAGNOSIS — B49 Unspecified mycosis: Secondary | ICD-10-CM | POA: Diagnosis not present

## 2024-01-26 DIAGNOSIS — M25571 Pain in right ankle and joints of right foot: Secondary | ICD-10-CM | POA: Diagnosis not present

## 2024-01-26 DIAGNOSIS — M86671 Other chronic osteomyelitis, right ankle and foot: Secondary | ICD-10-CM | POA: Diagnosis not present

## 2024-01-26 DIAGNOSIS — R895 Abnormal microbiological findings in specimens from other organs, systems and tissues: Secondary | ICD-10-CM | POA: Diagnosis not present

## 2024-01-26 NOTE — Telephone Encounter (Signed)
 Spoke with patient, explained to him oxycodone  is a controlled substance he may not be able to get it early. He stated he was going to contact insurance to see if they would let him get this prescription early.

## 2024-01-26 NOTE — Telephone Encounter (Signed)
 Patient is calling to check if oxyCODONE  (ROXICODONE ) 15 MG immediate release tablet [[id number]]  was sent to the pharmacy. Patient was advised that medication was sent[facility] advised that the medication is not due until 01/28/24. 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

## 2024-01-26 NOTE — Telephone Encounter (Signed)
 His insurance called reporting that he would need a new prescription with an increased amount, she says this is the only way they can override.   Tawane R. Best contact: [phone]  She says please follow up with the patient to make them aware of what to expect.

## 2024-01-26 NOTE — Telephone Encounter (Signed)
 Med reordered 01/26/24 #60. NT not delegated to refuse med.

## 2024-01-27 DIAGNOSIS — M86671 Other chronic osteomyelitis, right ankle and foot: Secondary | ICD-10-CM | POA: Diagnosis not present

## 2024-01-27 DIAGNOSIS — M25571 Pain in right ankle and joints of right foot: Secondary | ICD-10-CM | POA: Diagnosis not present

## 2024-01-27 DIAGNOSIS — E876 Hypokalemia: Secondary | ICD-10-CM | POA: Diagnosis not present

## 2024-01-27 DIAGNOSIS — M1380 Other specified arthritis, unspecified site: Secondary | ICD-10-CM | POA: Diagnosis not present

## 2024-01-27 DIAGNOSIS — M86171 Other acute osteomyelitis, right ankle and foot: Secondary | ICD-10-CM | POA: Diagnosis not present

## 2024-01-27 DIAGNOSIS — G8929 Other chronic pain: Secondary | ICD-10-CM | POA: Diagnosis not present

## 2024-01-27 DIAGNOSIS — L0390 Cellulitis, unspecified: Secondary | ICD-10-CM | POA: Diagnosis not present

## 2024-01-27 DIAGNOSIS — M25471 Effusion, right ankle: Secondary | ICD-10-CM | POA: Diagnosis not present

## 2024-01-28 DIAGNOSIS — L0390 Cellulitis, unspecified: Secondary | ICD-10-CM | POA: Diagnosis not present

## 2024-01-28 DIAGNOSIS — M1380 Other specified arthritis, unspecified site: Secondary | ICD-10-CM | POA: Diagnosis not present

## 2024-01-28 DIAGNOSIS — M86671 Other chronic osteomyelitis, right ankle and foot: Secondary | ICD-10-CM | POA: Diagnosis not present

## 2024-01-28 DIAGNOSIS — E876 Hypokalemia: Secondary | ICD-10-CM | POA: Diagnosis not present

## 2024-01-28 DIAGNOSIS — M25471 Effusion, right ankle: Secondary | ICD-10-CM | POA: Diagnosis not present

## 2024-01-28 DIAGNOSIS — G8929 Other chronic pain: Secondary | ICD-10-CM | POA: Diagnosis not present

## 2024-01-29 ENCOUNTER — Inpatient Hospital Stay: Admitting: Family Medicine

## 2024-01-29 DIAGNOSIS — M869 Osteomyelitis, unspecified: Secondary | ICD-10-CM | POA: Diagnosis not present

## 2024-01-29 DIAGNOSIS — E876 Hypokalemia: Secondary | ICD-10-CM | POA: Diagnosis not present

## 2024-01-29 DIAGNOSIS — M86671 Other chronic osteomyelitis, right ankle and foot: Secondary | ICD-10-CM | POA: Diagnosis not present

## 2024-01-29 DIAGNOSIS — M1380 Other specified arthritis, unspecified site: Secondary | ICD-10-CM | POA: Diagnosis not present

## 2024-01-29 DIAGNOSIS — L089 Local infection of the skin and subcutaneous tissue, unspecified: Secondary | ICD-10-CM | POA: Diagnosis not present

## 2024-01-29 DIAGNOSIS — M25471 Effusion, right ankle: Secondary | ICD-10-CM | POA: Diagnosis not present

## 2024-01-29 DIAGNOSIS — M1468 Charcot's joint, vertebrae: Secondary | ICD-10-CM | POA: Diagnosis not present

## 2024-01-30 DIAGNOSIS — M25471 Effusion, right ankle: Secondary | ICD-10-CM | POA: Diagnosis not present

## 2024-01-30 DIAGNOSIS — F1090 Alcohol use, unspecified, uncomplicated: Secondary | ICD-10-CM | POA: Diagnosis not present

## 2024-01-30 DIAGNOSIS — M86671 Other chronic osteomyelitis, right ankle and foot: Secondary | ICD-10-CM | POA: Diagnosis not present

## 2024-01-30 DIAGNOSIS — M1468 Charcot's joint, vertebrae: Secondary | ICD-10-CM | POA: Diagnosis not present

## 2024-01-30 DIAGNOSIS — B9689 Other specified bacterial agents as the cause of diseases classified elsewhere: Secondary | ICD-10-CM | POA: Diagnosis not present

## 2024-01-30 DIAGNOSIS — M1380 Other specified arthritis, unspecified site: Secondary | ICD-10-CM | POA: Diagnosis not present

## 2024-01-30 DIAGNOSIS — K7460 Unspecified cirrhosis of liver: Secondary | ICD-10-CM | POA: Diagnosis not present

## 2024-01-30 DIAGNOSIS — L089 Local infection of the skin and subcutaneous tissue, unspecified: Secondary | ICD-10-CM | POA: Diagnosis not present

## 2024-01-31 ENCOUNTER — Telehealth: Payer: Self-pay

## 2024-01-31 NOTE — Transitions of Care (Post Inpatient/ED Visit) (Unsigned)
 01/31/2024  Patient ID: Tom [name], male   DOB: [birth year], 58 y.o.   MRN: [id number] [facility] RN CM post discharge outreach chart review attempted. Patient authorization required to view any documents from recent discharge from [facility]. Information/discharge dates sent to CCM RN CM Santana Stamp for any CCM follow up from prior referral.   Bing Edison MSN, RN RN Case Manager [place]  [facility] Office Hours M-F [phone] Direct Dial : [phone] Main Phone [phone]  Fax: [phone] [email or web address]

## 2024-01-31 NOTE — Transitions of Care (Post Inpatient/ED Visit) (Signed)
   01/31/2024  Name: Tom [name] MRN: [id number] DOB: 03/21/1966  Today's TOC FU Call Status: Today's TOC FU Call Status:: Unsuccessful Call (1st Attempt) Unsuccessful Call (1st Attempt) Date: 01/31/24  Attempted to reach the patient regarding the most recent Inpatient/ED visit. Access to discharge summary from [facility] restricted in Care Everywhere requiring patient authorization for specific encounters.   Follow Up Plan: Additional outreach attempts will be made to reach the patient to complete the Transitions of Care (Post Inpatient/ED visit) call.    Bing Edison MSN, RN RN Case Sales executive Health  [facility] Office Hours M-F [phone] Direct Dial : [phone] Main Phone [phone]  Fax: [phone] [email or web address]

## 2024-02-02 ENCOUNTER — Telehealth: Payer: Self-pay

## 2024-02-02 NOTE — Transitions of Care (Post Inpatient/ED Visit) (Signed)
   02/02/2024  Name: Tom [name] MRN: [id number] DOB: 08-Nov-1965  Today's TOC FU Call Status: Today's TOC FU Call Status:: Unsuccessful Call (2nd Attempt) Unsuccessful Call (2nd Attempt) Date: 02/02/24  Attempted to reach the patient regarding the most recent Inpatient/ED visit.  Follow Up Plan: Additional outreach attempts will be made to reach the patient to complete the Transitions of Care (Post Inpatient/ED visit) call.    Bing Edison MSN, RN RN Case Sales executive Health  [facility] Office Hours M-F [phone] Direct Dial : [phone] Main Phone [phone]  Fax: [phone] [email or web address]

## 2024-02-05 ENCOUNTER — Telehealth: Payer: Self-pay

## 2024-02-05 ENCOUNTER — Encounter: Payer: Self-pay | Admitting: Family Medicine

## 2024-02-05 ENCOUNTER — Other Ambulatory Visit: Payer: Self-pay | Admitting: Family Medicine

## 2024-02-05 ENCOUNTER — Ambulatory Visit: Admitting: Family Medicine

## 2024-02-05 VITALS — BP 130/68 | HR 83 | Ht 72.0 in | Wt 203.0 lb

## 2024-02-05 DIAGNOSIS — G8929 Other chronic pain: Secondary | ICD-10-CM | POA: Diagnosis not present

## 2024-02-05 DIAGNOSIS — R600 Localized edema: Secondary | ICD-10-CM | POA: Diagnosis not present

## 2024-02-05 DIAGNOSIS — K7031 Alcoholic cirrhosis of liver with ascites: Secondary | ICD-10-CM

## 2024-02-05 DIAGNOSIS — Z515 Encounter for palliative care: Secondary | ICD-10-CM | POA: Diagnosis not present

## 2024-02-05 DIAGNOSIS — M79671 Pain in right foot: Secondary | ICD-10-CM

## 2024-02-05 MED ORDER — OXYCODONE HCL 15 MG PO TABS
15.0000 mg | ORAL_TABLET | ORAL | 0 refills | Status: DC | PRN
Start: 2024-02-05 — End: 2024-02-12

## 2024-02-05 NOTE — Patient Instructions (Addendum)
 Thank you for coming to the office today.  Refill Oxycodone  today, please adhere to rx dosing regimen.  Call Your Orthopedic doctor to schedule follow-up  [facility] Tisa Fonda Mulch, MD  [address]  [place], [place] [address]  [phone]  Please schedule a Follow-up Appointment to: Return if symptoms worsen or fail to improve.  If you have any other questions or concerns, please feel free to call the office or send a message through MyChart. You may also schedule an earlier appointment if necessary.  Additionally, you may be receiving a survey about your experience at our office within a few days to 1 week by e-mail or mail. We value your feedback.  [name], DO [facility], [place]

## 2024-02-05 NOTE — Telephone Encounter (Signed)
 Copied from CRM #[id number]. Topic: Clinical - Medication Refill >> Feb 05, 2024  8:14 AM Myrick T wrote: Medication: oxyCODONE  (ROXICODONE ) 15 MG immediate release tablet  Has the patient contacted their pharmacy? No  This is the patient's preferred pharmacy:  [id number] [place] [place] - [address] [place] [address] Phone: [phone] Fax: [phone]  Is this the correct pharmacy for this prescription? Yes  Has the prescription been filled recently? Yes  Is the patient out of the medication? Yes  Has the patient been seen for an appointment in the last year OR does the patient have an upcoming appointment? Yes  Can we respond through MyChart? Yes  Agent: Please be advised that Rx refills may take up to 3 business days. We ask that you follow-up with your pharmacy.

## 2024-02-05 NOTE — Transitions of Care (Post Inpatient/ED Visit) (Signed)
   02/05/2024  Name: CONG HIGHTOWER MRN: [id number] DOB: March 28, 1966  Today's TOC FU Call Status: Today's TOC FU Call Status:: Unsuccessful Call (3rd Attempt) Unsuccessful Call (3rd Attempt) Date: 02/05/24  Attempted to reach the patient regarding the most recent Inpatient/ED visit. On chart review for this outreach it was noted that PCP HFU was completed today.   Follow Up Plan: No further outreach attempts will be made at this time. We have been unable to contact the patient.   Bing Edison MSN, RN RN Case Sales executive Health  [facility]Population [facility] Office Hours M-F [phone] Direct Dial : [phone] Main Phone [phone]  Fax: [phone] [email or web address]

## 2024-02-05 NOTE — Progress Notes (Signed)
 Subjective:    Patient ID: Tom [name], male    DOB: 08/01/1965, 58 y.o.   MRN: [id number]  Tom [name] is a 58 y.o. male presenting on 02/05/2024 for Medical Management of Chronic Issues and Pain Management   HPI  Discussed the use of AI scribe software for clinical note transcription with the patient, who gave verbal consent to proceed.  History of Present Illness   Tom [name] is a 58 year old male who presents for a hospital follow-up and medication refill due to severe foot pain from a yeast infection.  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 He reports recent [facility] Hospitalization. Also complication with yeast infection in this foot. Following with Orthopedics now discussion on amputation possibility - Pain has significantly impaired mobility - Unable to participate in family activities, uses scooter  Recent hospitalization and antimicrobial therapy - Hospitalized for five days for management of foot infection - Received intravenous antibiotics during hospitalization - Discharged on oral antibiotics, which have provided slight improvement in pain  Analgesic use and pain management - Currently taking oxycodone  for pain control: two pills in the morning, one at lunch, and one before bed (total four pills daily) - Has taken more oxycodone  than prescribed due to severity of pain, stating 'I probably ate more than I should have.'      05/16/2023    5:40 PM 02/22/2023    2:40 PM 03/21/2022    2:56 PM  Depression screen PHQ 2/9  Decreased Interest 0 0 0  Down, Depressed, Hopeless 0 0 0  PHQ - 2 Score 0 0 0  Altered sleeping 0  0  Tired, decreased energy 0  0  Change in appetite 0  0  Feeling bad or failure about yourself  0  0  Trouble concentrating 0  0  Moving slowly or fidgety/restless 0   0  Suicidal thoughts 0  0  PHQ-9 Score 0  0  Difficult doing work/chores Not difficult at all  Not difficult at all       09/21/2022   11:26 AM 12/16/2020    3:16 PM  GAD 7 : Generalized Anxiety Score  Nervous, Anxious, on Edge 0 0  Control/stop worrying 0 0  Worry too much - different things 0 0  Trouble relaxing 0 0  Restless 0 0  Easily annoyed or irritable 0 0  Afraid - awful might happen 0 0  Total GAD 7 Score 0 0  Anxiety Difficulty Not difficult at all Not difficult at all    Social History   Tobacco Use   Smoking status: Former    Current packs/day: 0.00    Average packs/day: 1 pack/day for 39.0 years (39.0 ttl pk-yrs)    Types: Cigarettes    Start date: 12/1976    Quit date: 12/2015    Years since quitting: 8.1   Smokeless tobacco: Never  Vaping Use   Vaping status: Never Used  Substance Use Topics   Alcohol use: No   Drug use: Yes    Types: Marijuana    Review of Systems Per HPI unless specifically indicated above     Objective:    BP 130/68 (BP Location: Right Arm, Patient Position: Sitting, Cuff Size: Normal)   Pulse 83   Ht 6' (1.829 m)   Wt 203 lb (92.1 kg)   SpO2 95%  BMI 27.53 kg/m   Wt Readings from Last 3 Encounters:  02/05/24 203 lb (92.1 kg)  11/28/23 195 lb (88.5 kg)  09/13/23 201 lb 12.8 oz (91.5 kg)    Physical Exam Vitals and nursing note reviewed.  Constitutional:      General: He is not in acute distress.    Appearance: Normal appearance. He is well-developed. He is not diaphoretic.     Comments: Well-appearing, comfortable, cooperative  HENT:     Head: Normocephalic and atraumatic.  Eyes:     General:        Right eye: No discharge.        Left eye: No discharge.     Conjunctiva/sclera: Conjunctivae normal.  Cardiovascular:     Rate and Rhythm: Normal rate.  Pulmonary:     Effort: Pulmonary effort is normal.  Musculoskeletal:     Right lower leg: Edema (R foot ankle significant edema deformity) present.      Comments: Using scooter to rest R Knee on to avoid ambulation on leg  Skin:    General: Skin is warm and dry.     Findings: No erythema or rash.  Neurological:     Mental Status: He is alert and oriented to person, place, and time.  Psychiatric:        Mood and Affect: Mood normal.        Behavior: Behavior normal.        Thought Content: Thought content normal.     Comments: Well groomed, good eye contact, normal speech and thoughts     Results for orders placed or performed during the hospital encounter of 07/14/23  Lipase, blood   Collection Time: 07/14/23 10:40 AM  Result Value Ref Range   Lipase 26 11 - 51 U/L  Comprehensive metabolic panel   Collection Time: 07/14/23 10:40 AM  Result Value Ref Range   Sodium 130 (L) 135 - 145 mmol/L   Potassium 3.8 3.5 - 5.1 mmol/L   Chloride 95 (L) 98 - 111 mmol/L   CO2 22 22 - 32 mmol/L   Glucose, Bld 127 (H) 70 - 99 mg/dL   BUN 43 (H) 6 - 20 mg/dL   Creatinine, Ser 8.47 (H) 0.61 - 1.24 mg/dL   Calcium  10.1 8.9 - 10.3 mg/dL   Total Protein 8.0 6.5 - 8.1 g/dL   Albumin  3.3 (L) 3.5 - 5.0 g/dL   AST 47 (H) 15 - 41 U/L   ALT 27 0 - 44 U/L   Alkaline Phosphatase 83 38 - 126 U/L   Total Bilirubin 1.6 (H) 0.0 - 1.2 mg/dL   GFR, Estimated 52 (L) >60 mL/min   Anion gap 13 5 - 15  CBC   Collection Time: 07/14/23 10:40 AM  Result Value Ref Range   WBC 6.3 4.0 - 10.5 K/uL   RBC 4.05 (L) 4.22 - 5.81 MIL/uL   Hemoglobin 10.9 (L) 13.0 - 17.0 g/dL   HCT 66.6 (L) 60.9 - 47.9 %   MCV 82.2 80.0 - 100.0 fL   MCH 26.9 26.0 - 34.0 pg   MCHC 32.7 30.0 - 36.0 g/dL   RDW 84.8 88.4 - 84.4 %   Platelets 402 (H) 150 - 400 K/uL   nRBC 0.0 0.0 - 0.2 %  Ammonia   Collection Time: 07/14/23 10:40 AM  Result Value Ref Range   Ammonia 84 (H) 9 - 35 umol/L  Protime-INR   Collection Time: 07/14/23 12:55 PM  Result Value Ref Range  Prothrombin Time 16.3 (H) 11.4 - 15.2 seconds   INR 1.3 (H) 0.8 - 1.2  Comprehensive metabolic panel   Collection Time:  07/15/23  3:12 AM  Result Value Ref Range   Sodium 129 (L) 135 - 145 mmol/L   Potassium 4.2 3.5 - 5.1 mmol/L   Chloride 95 (L) 98 - 111 mmol/L   CO2 22 22 - 32 mmol/L   Glucose, Bld 104 (H) 70 - 99 mg/dL   BUN 41 (H) 6 - 20 mg/dL   Creatinine, Ser 8.24 (H) 0.61 - 1.24 mg/dL   Calcium  9.6 8.9 - 10.3 mg/dL   Total Protein 7.5 6.5 - 8.1 g/dL   Albumin  3.0 (L) 3.5 - 5.0 g/dL   AST 41 15 - 41 U/L   ALT 25 0 - 44 U/L   Alkaline Phosphatase 82 38 - 126 U/L   Total Bilirubin 1.3 (H) 0.0 - 1.2 mg/dL   GFR, Estimated 44 (L) >60 mL/min   Anion gap 12 5 - 15  CBC   Collection Time: 07/15/23  3:12 AM  Result Value Ref Range   WBC 7.3 4.0 - 10.5 K/uL   RBC 3.71 (L) 4.22 - 5.81 MIL/uL   Hemoglobin 9.8 (L) 13.0 - 17.0 g/dL   HCT 69.3 (L) 60.9 - 47.9 %   MCV 82.5 80.0 - 100.0 fL   MCH 26.4 26.0 - 34.0 pg   MCHC 32.0 30.0 - 36.0 g/dL   RDW 84.8 88.4 - 84.4 %   Platelets 377 150 - 400 K/uL   nRBC 0.0 0.0 - 0.2 %  Protime-INR   Collection Time: 07/15/23  3:12 AM  Result Value Ref Range   Prothrombin Time 16.8 (H) 11.4 - 15.2 seconds   INR 1.3 (H) 0.8 - 1.2  Urinalysis, Complete w Microscopic -Urine, Clean Catch   Collection Time: 07/15/23 12:27 PM  Result Value Ref Range   Color, Urine YELLOW (A) YELLOW   APPearance CLEAR (A) CLEAR   Specific Gravity, Urine 1.014 1.005 - 1.030   pH 6.0 5.0 - 8.0   Glucose, UA NEGATIVE NEGATIVE mg/dL   Hgb urine dipstick NEGATIVE NEGATIVE   Bilirubin Urine NEGATIVE NEGATIVE   Ketones, ur NEGATIVE NEGATIVE mg/dL   Protein, ur NEGATIVE NEGATIVE mg/dL   Nitrite NEGATIVE NEGATIVE   Leukocytes,Ua NEGATIVE NEGATIVE   RBC / HPF 0-5 0 - 5 RBC/hpf   WBC, UA 0 0 - 5 WBC/hpf   Bacteria, UA NONE SEEN NONE SEEN   Squamous Epithelial / HPF 0 0 - 5 /HPF      Assessment & Plan:   Problem List Items Addressed This Visit     Alcoholic cirrhosis of liver with ascites (HCC)   Edema of right lower extremity   Other Visit Diagnoses       Chronic foot pain,  right    -  Primary   Relevant Medications   oxyCODONE  (ROXICODONE ) 15 MG immediate release tablet     Palliative care patient       Relevant Medications   oxyCODONE  (ROXICODONE ) 15 MG immediate release tablet        Chronic right foot pain  Chronic Lower Extremity Edema 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  He reports recent hospitalization at [facility].  He describes complication with fungal infection in this foot/ankle. Advanced osteoarthritis He states that Orthopedics at [facility] Dr Tisa discussing amputation of foot/ankle now awaiting next appointment.  Advised that we  are maintaining his pain medication for this complicated problem however in future I recommended that he pursue surgical procedure from Orthopedics as recommended  - Prescribed oxycodone , [facility]. - Advised on fill dates, he does admit to taking more than prescribed since last visit, today is day 8 out of 10 day course, he may be eligible to fill today. He will check with pharmacy - Instructed to contact Dr. Tisa for surgical consultation. - Provided Dr. Odis contact for scheduling.       No orders of the defined types were placed in this encounter.   Meds ordered this encounter  Medications   oxyCODONE  (ROXICODONE ) 15 MG immediate release tablet    Sig: Take 1 tablet (15 mg total) by mouth every 4 (four) hours as needed for pain.    Dispense:  60 tablet    Refill:  0    First fill 02/05/24    Follow up plan: Return if symptoms worsen or fail to improve.   [name], DO [facility] 02/05/2024, 2:12 PM

## 2024-02-06 DIAGNOSIS — G8929 Other chronic pain: Secondary | ICD-10-CM | POA: Diagnosis not present

## 2024-02-06 DIAGNOSIS — M25571 Pain in right ankle and joints of right foot: Secondary | ICD-10-CM | POA: Diagnosis not present

## 2024-02-06 DIAGNOSIS — M960 Pseudarthrosis after fusion or arthrodesis: Secondary | ICD-10-CM | POA: Diagnosis not present

## 2024-02-06 DIAGNOSIS — E43 Unspecified severe protein-calorie malnutrition: Secondary | ICD-10-CM | POA: Diagnosis not present

## 2024-02-12 ENCOUNTER — Ambulatory Visit: Payer: Self-pay

## 2024-02-12 ENCOUNTER — Other Ambulatory Visit: Payer: Self-pay | Admitting: Family Medicine

## 2024-02-12 DIAGNOSIS — G8929 Other chronic pain: Secondary | ICD-10-CM

## 2024-02-12 DIAGNOSIS — Z515 Encounter for palliative care: Secondary | ICD-10-CM

## 2024-02-12 MED ORDER — OXYCODONE HCL 15 MG PO TABS: 15.0000 mg | ORAL_TABLET | Freq: Four times a day (QID) | ORAL | 0 refills | Status: DC | PRN

## 2024-02-12 NOTE — Telephone Encounter (Signed)
 Understood. Ultimately increased pain may not be something that we can solve with medication. I have adjusted his med as best I can in order to allow it to be filled but it is up to pharmacy. He will need to pursue further management with his [facility] Orthopedic surgeon next. And we would pursue Pain Management options again in future  [name], DO [facility] 02/12/2024, 1:25 PM

## 2024-02-12 NOTE — Telephone Encounter (Signed)
 FYI Only or Action Required?: Action required by provider: medication refill request.  Patient was last seen in primary care on 02/05/2024 by Edman Marsa PARAS, DO.  Called Nurse Triage reporting Pain and Medication Refill.  Symptoms began ongoing - out of pain medication.  Interventions attempted: Nothing.  Symptoms are: gradually worsening.  Triage Disposition: Call PCP Now  Patient/caregiver understands and will follow disposition?: Yes - pt states that he is out of pain medication. He states that his pain is 10/10.                    Copied from CRM #[id number]. Topic: Clinical - Red Word Triage >> Feb 12, 2024 10:28 AM Dedra B wrote: Red Word that prompted transfer to Nurse Triage: Pt is having severe pain in his foot and out of pain medicines. He says he is having an amputation soon. Reason for Disposition  [1] Prescription refill request for ESSENTIAL medicine (i.e., likelihood of harm to patient if not taken) AND [2] triager unable to refill per department policy  Answer Assessment - Initial Assessment Questions 1. DRUG NAME: What medicine do you need to have refilled?     Pain medication 2. REFILLS REMAINING: How many refills are remaining? Notes: The label on the medicine or pill bottle will show how many refills are remaining. If there are no refills remaining, then a renewal may be needed.     none  4. PRESCRIBER: Who prescribed it? Note: The prescribing doctor or group is responsible for refill approvals..     Dr. Edman  6. SYMPTOMS: Do you have any symptoms?     Pain  Protocols used: Medication Refill and Renewal Call-A-AH

## 2024-02-12 NOTE — Telephone Encounter (Signed)
 I attempted to call patient. I did not reach him.  Please try again later to let him know that his Rx for oxycodone  [facility]12/24  However, also let him know that it does look like ran out faster than expected. His previous order was filled on 02/06/24 and it should have been good for at least 10 days.  It has only been 6 days now. So he will need to talk to pharmacy about soonest he can get it filled. I updated the order, so hopefully they can fill it today or tomorrow. But there is not much else we can do on our end.  If continue to run out early, we may not be able to solve this problem in the future.  [name], DO [facility] 02/12/2024, 11:34 AM

## 2024-02-13 ENCOUNTER — Telehealth: Payer: Self-pay

## 2024-02-13 NOTE — Telephone Encounter (Signed)
 Copied from CRM [id number]. Topic: General - Other >> Feb 13, 2024  2:41 PM [name] wrote: Reason for CRM: Patient wanted to let Dr. Edman know that he really appreciated what he did for him on yesterday. Patient stated he was able to take care of business  Callback #: [phone]

## 2024-02-14 ENCOUNTER — Telehealth: Payer: Self-pay

## 2024-02-14 DIAGNOSIS — Z9189 Other specified personal risk factors, not elsewhere classified: Secondary | ICD-10-CM | POA: Diagnosis not present

## 2024-02-14 DIAGNOSIS — L03119 Cellulitis of unspecified part of limb: Secondary | ICD-10-CM | POA: Diagnosis not present

## 2024-02-14 DIAGNOSIS — M86671 Other chronic osteomyelitis, right ankle and foot: Secondary | ICD-10-CM | POA: Diagnosis not present

## 2024-02-14 DIAGNOSIS — B379 Candidiasis, unspecified: Secondary | ICD-10-CM | POA: Diagnosis not present

## 2024-02-14 DIAGNOSIS — M009 Pyogenic arthritis, unspecified: Secondary | ICD-10-CM | POA: Diagnosis not present

## 2024-02-14 NOTE — Telephone Encounter (Signed)
 Copied from CRM #[id number]. Topic: General - Other >> Feb 13, 2024  4:17 PM Lauren C wrote: Reason for CRM: Pt coming tomorrow to pick up discharge paperwork for insurance purposes- [phone]

## 2024-02-21 ENCOUNTER — Other Ambulatory Visit: Payer: Self-pay | Admitting: Family Medicine

## 2024-02-21 DIAGNOSIS — G8929 Other chronic pain: Secondary | ICD-10-CM

## 2024-02-21 DIAGNOSIS — Z515 Encounter for palliative care: Secondary | ICD-10-CM

## 2024-02-21 NOTE — Telephone Encounter (Unsigned)
 Copied from CRM [id number]. Topic: Clinical - Medication Refill >> Feb 21, 2024  1:01 PM Delon T wrote: Medication: oxyCODONE  (ROXICODONE ) 15 MG immediate release tablet  Has the patient contacted their pharmacy? No (Agent: If no, request that the patient contact the pharmacy for the refill. If patient does not wish to contact the pharmacy document the reason why and proceed with request.) (Agent: If yes, when and what did the pharmacy advise?)  This is the patient's preferred pharmacy:  [id number] [place],  - [address] [place] [address] Phone: [phone] Fax: [phone]    Is this the correct pharmacy for this prescription? Yes If no, delete pharmacy and type the correct one.   Has the prescription been filled recently? Yes  Is the patient out of the medication? Yes  Has the patient been seen for an appointment in the last year OR does the patient have an upcoming appointment? Yes  Can we respond through MyChart? Yes  Agent: Please be advised that Rx refills may take up to 3 business days. We ask that you follow-up with your pharmacy.

## 2024-02-21 NOTE — Telephone Encounter (Signed)
 02/12/24 70 tabs/0 RF

## 2024-02-22 MED ORDER — OXYCODONE HCL 15 MG PO TABS: 15.0000 mg | ORAL_TABLET | Freq: Four times a day (QID) | ORAL | 0 refills | Status: DC | PRN

## 2024-02-22 NOTE — Telephone Encounter (Signed)
 Requested medications are due for refill today.    Requested medications are on the active medications list.  yes  Last refill. 02/12/2024 #70 0 rf  Future visit scheduled.   no  Notes to clinic.  Refill not delegated.    Requested Prescriptions  Pending Prescriptions Disp Refills   oxyCODONE  (ROXICODONE ) 15 MG immediate release tablet 70 tablet 0    Sig: Take 1-2 tablets (15-30 mg total) by mouth every 6 (six) hours as needed for pain. Max dose is 7 pills within 24 hours.     Not Delegated - Analgesics:  Opioid Agonists Failed - 02/22/2024 10:25 AM      Failed - This refill cannot be delegated      Failed - Urine Drug Screen completed in last 360 days      Passed - Valid encounter within last 3 months    Recent Outpatient Visits           2 weeks ago Chronic foot pain, right   [place] [facility] [place], Marsa PARAS, DO   5 months ago Edema of right lower extremity   [place] [facility] [place], Marsa PARAS, DO   7 months ago Alcoholic cirrhosis of liver with ascites [facility])   [place] [facility] [place], Marsa PARAS, [place]

## 2024-02-22 NOTE — Telephone Encounter (Signed)
 Patient calling to check on request. Advised it is still pending. Patient requesting refill to be sent in as soon as possible.

## 2024-02-22 NOTE — Telephone Encounter (Signed)
 Requested medications are due for refill today.  Tomorrow will be 10 days.  Requested medications are on the active medications list.  yes  Last refill. 02/12/2024 #70 0 rf  Future visit scheduled.   no  Notes to clinic.  Refill not delegated.    Requested Prescriptions  Pending Prescriptions Disp Refills   oxyCODONE  (ROXICODONE ) 15 MG immediate release tablet 70 tablet 0    Sig: Take 1-2 tablets (15-30 mg total) by mouth every 6 (six) hours as needed for pain. Max dose is 7 pills within 24 hours.     Not Delegated - Analgesics:  Opioid Agonists Failed - 02/22/2024 10:23 AM      Failed - This refill cannot be delegated      Failed - Urine Drug Screen completed in last 360 days      Passed - Valid encounter within last 3 months    Recent Outpatient Visits           2 weeks ago Chronic foot pain, right   [facility] [place], Marsa PARAS, DO   5 months ago Edema of right lower extremity   [place] [facility] [place], Marsa PARAS, DO   7 months ago Alcoholic cirrhosis of liver with ascites [facility])    [facility] [place], Marsa PARAS, [place]

## 2024-02-23 NOTE — Patient Outreach (Signed)
 On previous call, patient stated he was unsure if he wanted to be in CCM program, states, I don't like to tell people my business.  When attempted on 01/16/24, patient was in the ED and stated to sister he didn't want to participate in CCM.  Will close program.

## 2024-02-29 ENCOUNTER — Other Ambulatory Visit: Payer: Self-pay | Admitting: Family Medicine

## 2024-02-29 NOTE — Telephone Encounter (Signed)
 Disregard

## 2024-03-01 ENCOUNTER — Other Ambulatory Visit: Payer: Self-pay | Admitting: Family Medicine

## 2024-03-01 DIAGNOSIS — Z515 Encounter for palliative care: Secondary | ICD-10-CM

## 2024-03-01 DIAGNOSIS — G8929 Other chronic pain: Secondary | ICD-10-CM

## 2024-03-01 MED ORDER — OXYCODONE HCL 15 MG PO TABS: 15.0000 mg | ORAL_TABLET | Freq: Four times a day (QID) | ORAL | 0 refills | Status: DC | PRN

## 2024-03-09 ENCOUNTER — Other Ambulatory Visit: Payer: Self-pay | Admitting: Family Medicine

## 2024-03-09 DIAGNOSIS — M51369 Other intervertebral disc degeneration, lumbar region without mention of lumbar back pain or lower extremity pain: Secondary | ICD-10-CM

## 2024-03-09 DIAGNOSIS — M545 Low back pain, unspecified: Secondary | ICD-10-CM

## 2024-03-11 ENCOUNTER — Ambulatory Visit: Admitting: Family Medicine

## 2024-03-11 ENCOUNTER — Encounter: Payer: Self-pay | Admitting: Family Medicine

## 2024-03-11 ENCOUNTER — Other Ambulatory Visit: Payer: Self-pay | Admitting: Family Medicine

## 2024-03-11 VITALS — BP 130/78 | HR 82 | Ht 72.0 in

## 2024-03-11 DIAGNOSIS — K7031 Alcoholic cirrhosis of liver with ascites: Secondary | ICD-10-CM

## 2024-03-11 DIAGNOSIS — L988 Other specified disorders of the skin and subcutaneous tissue: Secondary | ICD-10-CM | POA: Diagnosis not present

## 2024-03-11 DIAGNOSIS — M545 Low back pain, unspecified: Secondary | ICD-10-CM | POA: Diagnosis not present

## 2024-03-11 DIAGNOSIS — M79671 Pain in right foot: Secondary | ICD-10-CM

## 2024-03-11 DIAGNOSIS — G8929 Other chronic pain: Secondary | ICD-10-CM | POA: Diagnosis not present

## 2024-03-11 DIAGNOSIS — Z515 Encounter for palliative care: Secondary | ICD-10-CM

## 2024-03-11 MED ORDER — OXYCODONE HCL 15 MG PO TABS: 15.0000 mg | ORAL_TABLET | Freq: Four times a day (QID) | ORAL | 0 refills | Status: DC | PRN

## 2024-03-11 MED ORDER — LIDOCAINE 5 % EX PTCH: 1.0000 | MEDICATED_PATCH | CUTANEOUS | 3 refills | Status: DC

## 2024-03-11 MED ORDER — CLOTRIMAZOLE-BETAMETHASONE 1-0.05 % EX CREA: 1.0000 | TOPICAL_CREAM | Freq: Every day | CUTANEOUS | 0 refills | Status: AC

## 2024-03-11 NOTE — Telephone Encounter (Signed)
 Pt called again to check on status of his pain medication.

## 2024-03-11 NOTE — Progress Notes (Signed)
 Subjective:    Patient ID: Tom [name], male    DOB: [birth year], 58 y.o.   MRN: [id number]  Tom [name] is a 58 y.o. male presenting on 03/11/2024 for Rash (Started about 2 weeks ago)  Patient presents for a same day appointment.  HPI  Discussed the use of AI scribe software for clinical note transcription with the patient, who gave verbal consent to proceed.  History of Present Illness   Tom [name] is a 58 year old male with cirrhosis of the liver who presents with a progressively enlarging skin lesion.  R Lower Extremity Progressively enlarging skin lesion - Lesion initially appeared as a small spot and has progressively enlarged over the past month - No pruritus, pain, or burning associated with the lesion - No similar lesions present elsewhere on the body - No topical treatments applied to the lesion - Initial concern for eczema; wife suggested possible ringworm - Concerned about the increasing size of the lesion  [place]ronci Right Foot Pain / Chronic pain management - Chronic pain requiring daily pain medication for activities of daily living, including laundry and playing with grandson - Insurance no longer covers pain medication; currently paying full price - Uses lidocaine  patches, typically receives a box of thirty with refills - Awaiting [facility] for amputation  Cirrhosis-related complications - Cirrhosis of the liver with associated nutritional concerns      05/16/2023    5:40 PM 02/22/2023    2:40 PM 03/21/2022    2:56 PM  Depression screen PHQ 2/9  Decreased Interest 0 0 0  Down, Depressed, Hopeless 0 0 0  PHQ - 2 Score 0 0 0  Altered sleeping 0  0  Tired, decreased energy 0  0  Change in appetite 0  0  Feeling bad or failure about yourself  0  0  Trouble concentrating 0  0  Moving slowly or fidgety/restless 0  0  Suicidal thoughts 0  0  PHQ-9 Score 0  0  Difficult doing work/chores Not difficult at all  Not difficult  at all       09/21/2022   11:26 AM 12/16/2020    3:16 PM  GAD 7 : Generalized Anxiety Score  Nervous, Anxious, on Edge 0 0  Control/stop worrying 0 0  Worry too much - different things 0 0  Trouble relaxing 0 0  Restless 0 0  Easily annoyed or irritable 0 0  Afraid - awful might happen 0 0  Total GAD 7 Score 0 0  Anxiety Difficulty Not difficult at all Not difficult at all    Social History   Tobacco Use   Smoking status: Former    Current packs/day: 0.00    Average packs/day: 1 pack/day for 39.0 years (39.0 ttl pk-yrs)    Types: Cigarettes    Start date: 12/1976    Quit date: 12/2015    Years since quitting: 8.2   Smokeless tobacco: Never  Vaping Use   Vaping status: Never Used  Substance Use Topics   Alcohol use: No   Drug use: Yes    Types: Marijuana    Review of Systems Per HPI unless specifically indicated above     Objective:    BP 130/78 (BP Location: Left Arm, Patient Position: Sitting, Cuff Size: Normal)   Pulse 82   Ht 6' (1.829 m)   SpO2 99%   BMI 27.53 kg/m   Wt Readings from Last 3 Encounters:  02/05/24 203 lb (92.1 kg)  11/28/23 195 lb (88.5 kg)  09/13/23 201 lb 12.8 oz (91.5 kg)    Physical Exam Vitals and nursing note reviewed.  Constitutional:      General: He is not in acute distress.    Appearance: Normal appearance. He is well-developed. He is not diaphoretic.     Comments: Well-appearing, comfortable, cooperative  HENT:     Head: Normocephalic and atraumatic.  Eyes:     General:        Right eye: No discharge.        Left eye: No discharge.     Conjunctiva/sclera: Conjunctivae normal.  Cardiovascular:     Rate and Rhythm: Normal rate.  Pulmonary:     Effort: Pulmonary effort is normal.  Musculoskeletal:     Comments: Chronic R Foot pain swelling limited mobility, in wrap today  Skin:    General: Skin is warm and dry.     Findings: Lesion (R anterior lower leg below knee with raised  4-5 cm area of raised circular lesion  with flaky skin dry appearance) present. No erythema or rash.  Neurological:     Mental Status: He is alert and oriented to person, place, and time.  Psychiatric:        Mood and Affect: Mood normal.        Behavior: Behavior normal.        Thought Content: Thought content normal.     Comments: Well groomed, good eye contact, normal speech and thoughts     Results for orders placed or performed during the hospital encounter of 07/14/23  Lipase, blood   Collection Time: 07/14/23 10:40 AM  Result Value Ref Range   Lipase 26 11 - 51 U/L  Comprehensive metabolic panel   Collection Time: 07/14/23 10:40 AM  Result Value Ref Range   Sodium 130 (L) 135 - 145 mmol/L   Potassium 3.8 3.5 - 5.1 mmol/L   Chloride 95 (L) 98 - 111 mmol/L   CO2 22 22 - 32 mmol/L   Glucose, Bld 127 (H) 70 - 99 mg/dL   BUN 43 (H) 6 - 20 mg/dL   Creatinine, Ser 8.47 (H) 0.61 - 1.24 mg/dL   Calcium  10.1 8.9 - 10.3 mg/dL   Total Protein 8.0 6.5 - 8.1 g/dL   Albumin  3.3 (L) 3.5 - 5.0 g/dL   AST 47 (H) 15 - 41 U/L   ALT 27 0 - 44 U/L   Alkaline Phosphatase 83 38 - 126 U/L   Total Bilirubin 1.6 (H) 0.0 - 1.2 mg/dL   GFR, Estimated 52 (L) >60 mL/min   Anion gap 13 5 - 15  CBC   Collection Time: 07/14/23 10:40 AM  Result Value Ref Range   WBC 6.3 4.0 - 10.5 K/uL   RBC 4.05 (L) 4.22 - 5.81 MIL/uL   Hemoglobin 10.9 (L) 13.0 - 17.0 g/dL   HCT 66.6 (L) 60.9 - 47.9 %   MCV 82.2 80.0 - 100.0 fL   MCH 26.9 26.0 - 34.0 pg   MCHC 32.7 30.0 - 36.0 g/dL   RDW 84.8 88.4 - 84.4 %   Platelets 402 (H) 150 - 400 K/uL   nRBC 0.0 0.0 - 0.2 %  Ammonia   Collection Time: 07/14/23 10:40 AM  Result Value Ref Range   Ammonia 84 (H) 9 - 35 umol/L  Protime-INR   Collection Time: 07/14/23 12:55 PM  Result Value Ref Range   Prothrombin Time 16.3 (H) 11.4 - 15.2 seconds   INR 1.3 (  H) 0.8 - 1.2  Comprehensive metabolic panel   Collection Time: 07/15/23  3:12 AM  Result Value Ref Range   Sodium 129 (L) 135 - 145 mmol/L    Potassium 4.2 3.5 - 5.1 mmol/L   Chloride 95 (L) 98 - 111 mmol/L   CO2 22 22 - 32 mmol/L   Glucose, Bld 104 (H) 70 - 99 mg/dL   BUN 41 (H) 6 - 20 mg/dL   Creatinine, Ser 8.24 (H) 0.61 - 1.24 mg/dL   Calcium  9.6 8.9 - 10.3 mg/dL   Total Protein 7.5 6.5 - 8.1 g/dL   Albumin  3.0 (L) 3.5 - 5.0 g/dL   AST 41 15 - 41 U/L   ALT 25 0 - 44 U/L   Alkaline Phosphatase 82 38 - 126 U/L   Total Bilirubin 1.3 (H) 0.0 - 1.2 mg/dL   GFR, Estimated 44 (L) >60 mL/min   Anion gap 12 5 - 15  CBC   Collection Time: 07/15/23  3:12 AM  Result Value Ref Range   WBC 7.3 4.0 - 10.5 K/uL   RBC 3.71 (L) 4.22 - 5.81 MIL/uL   Hemoglobin 9.8 (L) 13.0 - 17.0 g/dL   HCT 69.3 (L) 60.9 - 47.9 %   MCV 82.5 80.0 - 100.0 fL   MCH 26.4 26.0 - 34.0 pg   MCHC 32.0 30.0 - 36.0 g/dL   RDW 84.8 88.4 - 84.4 %   Platelets 377 150 - 400 K/uL   nRBC 0.0 0.0 - 0.2 %  Protime-INR   Collection Time: 07/15/23  3:12 AM  Result Value Ref Range   Prothrombin Time 16.8 (H) 11.4 - 15.2 seconds   INR 1.3 (H) 0.8 - 1.2  Urinalysis, Complete w Microscopic -Urine, Clean Catch   Collection Time: 07/15/23 12:27 PM  Result Value Ref Range   Color, Urine YELLOW (A) YELLOW   APPearance CLEAR (A) CLEAR   Specific Gravity, Urine 1.014 1.005 - 1.030   pH 6.0 5.0 - 8.0   Glucose, UA NEGATIVE NEGATIVE mg/dL   Hgb urine dipstick NEGATIVE NEGATIVE   Bilirubin Urine NEGATIVE NEGATIVE   Ketones, ur NEGATIVE NEGATIVE mg/dL   Protein, ur NEGATIVE NEGATIVE mg/dL   Nitrite NEGATIVE NEGATIVE   Leukocytes,Ua NEGATIVE NEGATIVE   RBC / HPF 0-5 0 - 5 RBC/hpf   WBC, UA 0 0 - 5 WBC/hpf   Bacteria, UA NONE SEEN NONE SEEN   Squamous Epithelial / HPF 0 0 - 5 /HPF      Assessment & Plan:   Problem List Items Addressed This Visit     Alcoholic cirrhosis of liver with ascites (HCC)   Chronic back pain   Relevant Medications   lidocaine  (LIDODERM ) 5 %   Other Visit Diagnoses       Chronic foot pain, right    -  Primary     Skin plaque        Relevant Medications   clotrimazole -betamethasone  (LOTRISONE ) cream       Chronic right foot pain  Chronic Lower Extremity Edema 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   He describes complication with fungal infection in this foot/ankle. Advanced osteoarthritis He states that Orthopedics at [facility] Dr Tisa discussing amputation of foot/ankle   Advised that we are maintaining his pain medication for this complicated problem however in future I recommended that he pursue surgical procedure from Orthopedics as recommended   - Prescribed oxycodone , [facility]  earlier today already 03/11/24 he is due for fill - Instructed to contact Dr. Tisa for surgical consultation.   R Knee / Lower extremity plaque / Skin rash possible fungal and inflammatory components Skin rash with scabby, inflamed, flaky, and irritated appearance. Rash does not itch, hurt, or burn but is enlarging. He expresses concern for ringworm infection - Prescribe combination antifungal and steroid cream for daily use for two weeks.        No orders of the defined types were placed in this encounter.   Meds ordered this encounter  Medications   clotrimazole -betamethasone  (LOTRISONE ) cream    Sig: Apply 1 Application topically daily. For up to 2 weeks or until resolved.    Dispense:  30 g    Refill:  0   lidocaine  (LIDODERM ) 5 %    Sig: Place 1 patch onto the skin daily. Remove & Discard patch within 12 hours or as directed by MD    Dispense:  30 patch    Refill:  3    Follow up plan: Return if symptoms worsen or fail to improve.   [name], DO [facility] 03/11/2024, 2:48 PM

## 2024-03-11 NOTE — Telephone Encounter (Signed)
 Requested Prescriptions  Pending Prescriptions Disp Refills   lidocaine  (LIDODERM ) 5 % [Pharmacy Med Name: LIDOCAINE  5% PATCH] 30 patch 5    Sig: PLACE 1 PATCH ONTO THE SKIN DAILY. REMOVE & DISCARD PATCH WITHIN 12 HOURS OR AS DIRECTED BY MD     Analgesics:  Topicals Failed - 03/11/2024  2:36 PM      Failed - Manual Review: Labs are only required if the patient has taken medication for more than 8 weeks.      Failed - HGB in normal range and within 360 days    Hemoglobin  Date Value Ref Range Status  07/15/2023 9.8 (L) 13.0 - 17.0 g/dL Final         Failed - HCT in normal range and within 360 days    HCT  Date Value Ref Range Status  07/15/2023 30.6 (L) 39.0 - 52.0 % Final         Failed - Cr in normal range and within 360 days    Creat  Date Value Ref Range Status  06/27/2023 1.20 0.70 - 1.30 mg/dL Final   Creatinine, Ser  Date Value Ref Range Status  07/15/2023 1.75 (H) 0.61 - 1.24 mg/dL Final         Passed - PLT in normal range and within 360 days    Platelets  Date Value Ref Range Status  07/15/2023 377 150 - 400 K/uL Final         Passed - eGFR is 30 or above and within 360 days    GFR, Estimated  Date Value Ref Range Status  07/15/2023 44 (L) >60 mL/min Final    Comment:    (NOTE) Calculated using the CKD-EPI Creatinine Equation (2021)    eGFR  Date Value Ref Range Status  06/27/2023 70 > OR = 60 mL/min/1.66m2 Final         Passed - Patient is not pregnant      Passed - Valid encounter within last 12 months    Recent Outpatient Visits           Today    [place] [facility] Marsa PARAS, DO   1 month ago Chronic foot pain, right   [place] [facility] [place], Marsa PARAS, DO   6 months ago Edema of right lower extremity   [place] [facility] [place], Marsa PARAS, DO   7 months ago Alcoholic cirrhosis of liver with ascites [facility])   [place] [facility]  [place], Marsa PARAS, [place]

## 2024-03-11 NOTE — Telephone Encounter (Signed)
 Copied from CRM [id number]. Topic: Clinical - Medication Refill >> Mar 11, 2024  8:04 AM [name] I wrote: Medication: oxyCODONE  (ROXICODONE ) 15 MG immediate release tablet  Has the patient contacted their pharmacy? No (Agent: If no, request that the patient contact the pharmacy for the refill. If patient does not wish to contact the pharmacy document the reason why and proceed with request.) (Agent: If yes, when and what did the pharmacy advise?)  This is the patient's preferred pharmacy:  [id number] [place] [place] - [address] [place] [address] Phone: [phone] Fax: [phone]  Is this the correct pharmacy for this prescription? Yes If no, delete pharmacy and type the correct one.   Has the prescription been filled recently? Yes  Is the patient out of the medication? Yes  Has the patient been seen for an appointment in the last year OR does the patient have an upcoming appointment? Yes  Can we respond through MyChart? Yes  Agent: Please be advised that Rx refills may take up to 3 business days. We ask that you follow-up with your pharmacy.

## 2024-03-11 NOTE — Patient Instructions (Addendum)
 Ordered pain medicine earlier today 1222pm  Use topical cream on the rash combo with steroid and anti fungal  Added lidocaine  patch  Please schedule a Follow-up Appointment to: Return if symptoms worsen or fail to improve.  If you have any other questions or concerns, please feel free to call the office or send a message through MyChart. You may also schedule an earlier appointment if necessary.  Additionally, you may be receiving a survey about your experience at our office within a few days to 1 week by e-mail or mail. We value your feedback.  [name], DO [facility], [place]

## 2024-03-11 NOTE — Telephone Encounter (Signed)
 Patient requesting refill as soon as possible. Patient states he is needing this done today.

## 2024-03-13 ENCOUNTER — Other Ambulatory Visit (HOSPITAL_COMMUNITY): Payer: Self-pay

## 2024-03-13 ENCOUNTER — Telehealth: Payer: Self-pay | Admitting: Pharmacy Technician

## 2024-03-13 NOTE — Telephone Encounter (Signed)
 Pharmacy Patient Advocate Encounter   Received notification from Onbase that prior authorization for Lidocaine  5% patches is required/requested.   Insurance verification completed.   The patient is insured through [employer] .   Per test claim: PA required; PA submitted to above mentioned insurance via Latent Key/confirmation #/EOC [id number] Status is pending

## 2024-03-13 NOTE — Telephone Encounter (Signed)
 Pharmacy Patient Advocate Encounter  Received notification from [employer] that Prior Authorization for Lidocaine  5% patches has been APPROVED from 03/13/24 to 03/13/25   PA #/Case ID/Reference #: [id number]  Approval letter indexed to media tab

## 2024-03-14 ENCOUNTER — Other Ambulatory Visit (HOSPITAL_COMMUNITY): Payer: Self-pay

## 2024-03-19 ENCOUNTER — Telehealth: Payer: Self-pay

## 2024-03-19 ENCOUNTER — Other Ambulatory Visit: Payer: Self-pay | Admitting: Family Medicine

## 2024-03-19 DIAGNOSIS — Z515 Encounter for palliative care: Secondary | ICD-10-CM

## 2024-03-19 DIAGNOSIS — G8929 Other chronic pain: Secondary | ICD-10-CM

## 2024-03-19 MED ORDER — OXYCODONE HCL 15 MG PO TABS
15.0000 mg | ORAL_TABLET | Freq: Four times a day (QID) | ORAL | 0 refills | Status: DC | PRN
Start: 2024-03-21 — End: 2024-03-28

## 2024-03-19 NOTE — Telephone Encounter (Signed)
 Last filled on 03/11/24, he gets a 10 day supply if he takes max dose, today is day 8, so he is due for fill on Thursday 10/2  I called him and advised him of this and sent rx for fill on Thursday 10/2. He understood and will follow up with pharmacy  He also updated he is still waiting to hear from orthopedics on future amputation schedule I advised him that I was not notified of the schedule for this either, and if he still has not heard, he should contact them or we can help look into it to figure out the scheduling timeline.  [name], DO [facility] 03/19/2024, 5:13 PM

## 2024-03-19 NOTE — Telephone Encounter (Signed)
 Copied from CRM #[id number]. Topic: Clinical - Medication Question >> Mar 19, 2024  4:21 PM Tom [name] wrote: Reason for CRM: Pt called to follow up on oxycodone  refill request.

## 2024-03-19 NOTE — Telephone Encounter (Signed)
 Copied from CRM #[id number]. Topic: Clinical - Medication Refill >> Mar 19, 2024  9:21 AM Carlatta H wrote: Medication: oxyCODONE  (ROXICODONE ) 15 MG immediate release tablet  Has the patient contacted their pharmacy? No (Agent: If no, request that the patient contact the pharmacy for the refill. If patient does not wish to contact the pharmacy document the reason why and proceed with request.) (Agent: If yes, when and what did the pharmacy advise?)  This is the patient's preferred pharmacy:  [id number] [place] [place] - [address] [place] [address] Phone: [phone] Fax: [phone]    Is this the correct pharmacy for this prescription? Yes If no, delete pharmacy and type the correct one.   Has the prescription been filled recently? No  Is the patient out of the medication? Yes  Has the patient been seen for an appointment in the last year OR does the patient have an upcoming appointment? Yes  Can we respond through MyChart? No  Agent: Please be advised that Rx refills may take up to 3 business days. We ask that you follow-up with your pharmacy.

## 2024-03-20 ENCOUNTER — Ambulatory Visit: Payer: Self-pay

## 2024-03-20 NOTE — Telephone Encounter (Signed)
 FYI Only or Action Required?: Action required by provider: update on patient condition.  Patient was last seen in primary care on 03/11/2024 by Edman Marsa PARAS, DO.  Called Nurse Triage reporting Medication Refill.  Symptoms began Ongoing.  Interventions attempted: Prescription medications: Oxycodone .  Symptoms are: gradually worsening.  Triage Disposition: Information or Advice Only Call  Patient/caregiver understands and will follow disposition?: Yes  **See note below**     Copied from CRM #[id number]. Topic: Clinical - Red Word Triage >> Mar 20, 2024  8:40 AM Willma SAUNDERS wrote: [facility] that prompted transfer to Nurse Triage: Patient states he is in a lot of pain in his foot. Pain medication is unable to be filled until 10/2 Reason for Disposition  Health information question, no triage required and triager able to answer question  Answer Assessment - Initial Assessment Questions 1. REASON FOR CALL: What is the main reason for your call? or How can I best help you?   Patient called in today seeking the status of his oxycodone  refill, patient was advised he may get the refill on 10/2 as advised by Dr. Edman, patient stated he understood when the provider spoke with him yesterday on 9/30. Patient stated pain is worse, and will be seen in the ED if pain becomes intolerable.  Protocols used: Information Only Call - No Triage-A-AH

## 2024-03-20 NOTE — Telephone Encounter (Signed)
 Understood. See my note on refill for fill dates and information. Unfortunately he will need Pain Management or return to specialist Orthopedic for further management if pain remains that severe despite therapy  [name], DO [facility] 03/20/2024, 9:26 AM

## 2024-03-28 ENCOUNTER — Other Ambulatory Visit: Payer: Self-pay | Admitting: Family Medicine

## 2024-03-28 DIAGNOSIS — Z515 Encounter for palliative care: Secondary | ICD-10-CM

## 2024-03-28 DIAGNOSIS — G8929 Other chronic pain: Secondary | ICD-10-CM

## 2024-03-28 MED ORDER — OXYCODONE HCL 15 MG PO TABS
15.0000 mg | ORAL_TABLET | Freq: Four times a day (QID) | ORAL | 0 refills | Status: DC | PRN
Start: 2024-03-31 — End: 2024-04-08

## 2024-03-29 ENCOUNTER — Ambulatory Visit: Payer: Self-pay

## 2024-03-29 ENCOUNTER — Telehealth: Payer: Self-pay

## 2024-03-29 NOTE — Telephone Encounter (Signed)
 Copied from CRM [id number]. Topic: Clinical - Medication Question >> Mar 29, 2024  9:01 AM Treva T wrote: Reason for CRM: Received call form patient, states called pharmacy and per pharmacy advised patient medication refill for,  Oxycodone , was too soon to refill.  Patient would like to know the date he can pick up medication.  Per chart review/notes, medication oxyCODONE  (ROXICODONE ) 15 MG immediate release tablet pick up date is (Starting on 03/31/2024).  Advised patient of above, verbalized understanding, patient did state his foot is swelling and having increased pain. Reports foot is swelling like the size of a basketball.   Red word CRM being sent to E2C2 Nurse Triage due to above symptom.

## 2024-03-29 NOTE — Telephone Encounter (Signed)
 FYI Only or Action Required?: FYI only for provider.  Patient was last seen in primary care on 03/11/2024 by Edman Marsa PARAS, DO.  Called Nurse Triage reporting No chief complaint on file..  Symptoms began several days ago.  Interventions attempted: Rest, hydration, or home remedies and Ice/heat application.  Symptoms are: gradually worsening.  Triage Disposition: See HCP Within 4 Hours (Or PCP Triage)  Patient/caregiver understands and will follow disposition?: Yes   Copied from CRM [id number]. Topic: Clinical - Red Word Triage >> Mar 29, 2024  9:08 AM Treva T wrote: [facility] that prompted transfer to Nurse Triage: Received call from patient, checking status of refill for pain medication.   Advised patient of requested information per medication prescription.  Patient Is reporting he is having increased throbbing pain, and right foot swelling the size of a basketball. Reason for Disposition  [1] SEVERE pain (e.g., excruciating, unable to do any normal activities) AND [2] not improved after 2 hours of pain medicine  Answer Assessment - Initial Assessment Questions 1. ONSET: When did the pain start?      Several Days Ago  2. LOCATION: Where is the pain located?      Right Foot  3. PAIN: How bad is the pain?    (Scale 1-10; or mild, moderate, severe)     Moderate to Severe  4. WORK OR EXERCISE: Has there been any recent work or exercise that involved this part of the body?      No   5. CAUSE: What do you think is causing the foot pain?     Unsure  6. OTHER SYMPTOMS: Do you have any other symptoms? (e.g., leg pain, rash, fever, numbness)     *No Answer*  Protocols used: Foot Pain-A-AH

## 2024-03-29 NOTE — Telephone Encounter (Signed)
Please refer to previous message

## 2024-03-30 ENCOUNTER — Other Ambulatory Visit: Payer: Self-pay

## 2024-03-30 ENCOUNTER — Emergency Department
Admission: EM | Admit: 2024-03-30 | Discharge: 2024-03-30 | Disposition: A | Discharge status: Home / Self Care | Attending: Emergency Medicine | Admitting: Emergency Medicine

## 2024-03-30 ENCOUNTER — Emergency Department

## 2024-03-30 DIAGNOSIS — G8929 Other chronic pain: Secondary | ICD-10-CM | POA: Diagnosis not present

## 2024-03-30 DIAGNOSIS — M25571 Pain in right ankle and joints of right foot: Secondary | ICD-10-CM | POA: Diagnosis present

## 2024-03-30 LAB — COMPREHENSIVE METABOLIC PANEL WITH GFR
ALT: 19 U/L (ref 0–44)
AST: 28 U/L (ref 15–41)
Albumin: 3.1 g/dL — ABNORMAL LOW (ref 3.5–5.0)
Alkaline Phosphatase: 92 U/L (ref 38–126)
Anion gap: 12 (ref 5–15)
BUN: 17 mg/dL (ref 6–20)
CO2: 19 mmol/L — ABNORMAL LOW (ref 22–32)
Calcium: 9.2 mg/dL (ref 8.9–10.3)
Chloride: 105 mmol/L (ref 98–111)
Creatinine, Ser: 0.97 mg/dL (ref 0.61–1.24)
GFR, Estimated: >60 mL/min (ref >60)
Glucose, Bld: 98 mg/dL (ref 70–99)
Potassium: 4 mmol/L (ref 3.5–5.1)
Sodium: 136 mmol/L (ref 135–145)
Total Bilirubin: 1.2 mg/dL (ref 0.0–1.2)
Total Protein: 6.8 g/dL (ref 6.5–8.1)

## 2024-03-30 LAB — CBC WITH DIFFERENTIAL/PLATELET
Abs Immature Granulocytes: 0.02 K/uL (ref 0.00–0.07)
Basophils Absolute: 0.1 K/uL (ref 0.0–0.1)
Basophils Relative: 2 %
Eosinophils Absolute: 0.5 K/uL (ref 0.0–0.5)
Eosinophils Relative: 7 %
HCT: 40.1 % (ref 39.0–52.0)
Hemoglobin: 12.7 g/dL — ABNORMAL LOW (ref 13.0–17.0)
Immature Granulocytes: 0 %
Lymphocytes Relative: 31 %
Lymphs Abs: 2.3 K/uL (ref 0.7–4.0)
MCH: 27.7 pg (ref 26.0–34.0)
MCHC: 31.7 g/dL (ref 30.0–36.0)
MCV: 87.4 fL (ref 80.0–100.0)
Monocytes Absolute: 1.1 K/uL — ABNORMAL HIGH (ref 0.1–1.0)
Monocytes Relative: 15 %
Neutro Abs: 3.4 K/uL (ref 1.7–7.7)
Neutrophils Relative %: 45 %
Platelets: 273 K/uL (ref 150–400)
RBC: 4.59 MIL/uL (ref 4.22–5.81)
RDW: 16.1 % — ABNORMAL HIGH (ref 11.5–15.5)
WBC: 7.5 K/uL (ref 4.0–10.5)
nRBC: 0 % (ref 0.0–0.2)

## 2024-03-30 LAB — LACTIC ACID, PLASMA: Lactic Acid, Venous: 0.9 mmol/L (ref 0.5–1.9)

## 2024-03-30 MED ORDER — OXYCODONE HCL 5 MG PO TABS
15.0000 mg | ORAL_TABLET | Freq: Once | ORAL | Status: AC
  Administered 2024-03-30: 15 mg via ORAL
  Filled 2024-03-30: qty 3

## 2024-03-30 MED ORDER — OXYCODONE-ACETAMINOPHEN 5-325 MG PO TABS
2.0000 | ORAL_TABLET | Freq: Once | ORAL | Status: AC
  Administered 2024-03-30: 2 via ORAL
  Filled 2024-03-30: qty 2

## 2024-03-30 NOTE — ED Notes (Signed)
 Pt given warm blankets and remote per request

## 2024-03-30 NOTE — ED Provider Notes (Signed)
 [facility] Provider Note   Event Date/Time   First MD Initiated Contact with Patient 03/30/24 [phone]     (approximate) History  Foot Pain  HPI Tom [name] is a 58 y.o. male with a stated past medical history of a yeast infection in the right ankle causing chronic pain.  Patient states that the swelling and redness around this joint has significantly improved however he is still painful.  Patient states that he currently has a prescription for 15-30 mg of oxycodone  every 6 hours however he has run out of this medication and does not have a refill until tomorrow.  Patient denies any new or worsening symptoms to include fever, spreading redness around the right ankle, decreased mobility, or chills ROS: Patient currently denies any vision changes, tinnitus, difficulty speaking, facial droop, sore throat, chest pain, shortness of breath, abdominal pain, nausea/vomiting/diarrhea, dysuria, or weakness/numbness/paresthesias in any extremity   Physical Exam  Triage Vital Signs: ED Triage Vitals  Encounter Vitals Group     BP 03/30/24 0711 126/76     Girls Systolic BP Percentile --      Girls Diastolic BP Percentile --      Boys Systolic BP Percentile --      Boys Diastolic BP Percentile --      Pulse Rate 03/30/24 0711 80     Resp 03/30/24 0711 18     Temp 03/30/24 0711 97.9 F (36.6 C)     Temp Source 03/30/24 0711 Oral     SpO2 03/30/24 0710 100 %     Weight 03/30/24 0712 196 lb (88.9 kg)     Height 03/30/24 0712 6' (1.829 m)     Head Circumference --      Peak Flow --      Pain Score 03/30/24 0711 10     Pain Loc --      Pain Education --      Exclude from Growth Chart --    Most recent vital signs: Vitals:   03/30/24 0710 03/30/24 0711  BP:  126/76  Pulse:  80  Resp:  18  Temp:  97.9 F (36.6 C)  SpO2: 100% 100%   General: Awake, oriented x4. CV:  Good peripheral perfusion. Resp:  Normal effort. Abd:  No  distention. Other:  Middle-aged overweight Caucasian male resting comfortably in no acute distress.  Significant swelling without any obvious erythema around the entirety of the right ankle ED Results / Procedures / Treatments  Labs (all labs ordered are listed, but only abnormal results are displayed) Labs Reviewed  COMPREHENSIVE METABOLIC PANEL WITH GFR - Abnormal; Notable for the following components:      Result Value   CO2 19 (*)    Albumin  3.1 (*)    All other components within normal limits  CBC WITH DIFFERENTIAL/PLATELET - Abnormal; Notable for the following components:   Hemoglobin 12.7 (*)    RDW 16.1 (*)    Monocytes Absolute 1.1 (*)    All other components within normal limits  LACTIC ACID, PLASMA  LACTIC ACID, PLASMA   RADIOLOGY ED MD interpretation: X-ray of the right ankle shows progressive bony destruction and collapse of the talus with large erosions involving the posterior malleolus of the distal tibia and the dorsal surface of the posterior calcaneus - All radiology independently interpreted and agree with radiology assessment Official radiology report(s): DG Ankle Complete Right Result Date: 03/30/2024 EXAM: 3 OR MORE VIEW(S) XRAY OF THE RIGHT ANKLE 03/30/2024 07:34:20 AM  CLINICAL HISTORY: Surveillance for previously diagnosed joint infection. COMPARISON: 03/28/2023 FINDINGS: BONES AND JOINTS: Abandoned screw fragments are identified within the distal tibia. Interval removal of previous cannulated screws which fixated the talar-calcaneal articulation. Progressive bony destruction and collapse of the talus. Large area of bony erosion is noted involving the posterior malleolus of the distal tibia and the dorsal surface of the posterior calcaneus. Signs of underlying joint effusion are noted along the medial and dorsal aspect of the ankle. SOFT TISSUES: There is diffuse soft tissue edema. Signs of underlying joint effusion are noted along the medial and dorsal aspect of the  ankle. IMPRESSION: 1. Progressive bony destruction and collapse of the talus with large erosions involving the posterior malleolus of the distal tibia and the dorsal surface of the posterior calcaneus. Imaging findings are indicative of underlying acute osteomyelitis 2. Diffuse soft tissue edema with associated ankle joint effusion. Electronically signed by: Waddell Calk MD 03/30/2024 07:43 AM EDT RP Workstation: [id number]   PROCEDURES: Critical Care performed: No Procedures MEDICATIONS ORDERED IN ED: Medications  oxyCODONE  (Oxy IR/ROXICODONE ) immediate release tablet 15 mg (has no administration in time range)  oxyCODONE -acetaminophen  (PERCOCET/ROXICET) 5-325 MG per tablet 2 tablet (2 tablets Oral Given 03/30/24 0738)   IMPRESSION / MDM / ASSESSMENT AND PLAN / ED COURSE  I reviewed the triage vital signs and the nursing notes.                             The patient is on the cardiac monitor to evaluate for evidence of arrhythmia and/or significant heart rate changes. Patient's presentation is most consistent with acute presentation with potential threat to life or bodily function. Patient is a 58 year old male with the above-stated past medical history presents complaining of pain throughout the right ankle that is stable for him however states that he has run out of his oxycodone  pain medication with no new prescription until tomorrow.   DDx: Osteomyelitis, cellulitis, necrotizing fasciitis, hardware infection Plan: CBC, CMP, lactate, x-ray of the right ankle Results: No leukocytosis, lactic acid normal X-ray showing bony destruction compared to x-ray from 1 year ago.  Patient has been on antibiotic medication through infectious disease at [facility] with Dr. Effie.  Patient had IV antibiotic through PICC line at home and is now on oral antibiotics.  Patient states he has follow-up with Dr. Prince office as well as his orthopedic, Dr. Tisa.  Patient states that he has a pain contract and is  only requesting a dose of oral pain medicine here to bridge him to getting his prescription tomorrow.  I have offered patient admission for possibly worsening osteomyelitis of the foot however he has elected to be discharged at this time with follow-up at infectious disease and [facility].  As patient states the swelling has improved, denies fevers, and only complains of chronic pain in this foot, I do not have great suspicion for worsening infection at this time.  Patient agrees with plan for discharge home at this time to follow-up with his orthopedist and infectious disease physician as well as refill his home opioid prescription tomorrow.  Patient given strict return precautions and all questions answered prior to discharge  Dispo: Discharge home    FINAL CLINICAL IMPRESSION(S) / ED DIAGNOSES   Final diagnoses:  Chronic pain of right ankle   Rx / DC Orders   ED Discharge Orders     None      Note:  This document was  prepared using Conservation officer, historic buildings and may include unintentional dictation errors.   Tanzie Rothschild K, MD 03/30/24 [phone]

## 2024-03-30 NOTE — ED Triage Notes (Signed)
 Pt BIB [facility] from home c/o chronic foot pain in the R foot. Pt takes oxycodone  15mg  every 4 -6 hours for pain management. Pt ran out x2 days ago and has a script pick up on Sunday but could not tolerate the pain. Pt has a active infection in foot in which he is taking antibiotics for. AXO, NAD

## 2024-04-02 DIAGNOSIS — I10 Essential (primary) hypertension: Secondary | ICD-10-CM | POA: Diagnosis not present

## 2024-04-02 DIAGNOSIS — G8929 Other chronic pain: Secondary | ICD-10-CM | POA: Diagnosis not present

## 2024-04-02 DIAGNOSIS — M216X1 Other acquired deformities of right foot: Secondary | ICD-10-CM | POA: Diagnosis not present

## 2024-04-02 DIAGNOSIS — J449 Chronic obstructive pulmonary disease, unspecified: Secondary | ICD-10-CM | POA: Diagnosis not present

## 2024-04-02 DIAGNOSIS — M85871 Other specified disorders of bone density and structure, right ankle and foot: Secondary | ICD-10-CM | POA: Diagnosis not present

## 2024-04-02 DIAGNOSIS — Z87891 Personal history of nicotine dependence: Secondary | ICD-10-CM | POA: Diagnosis not present

## 2024-04-02 DIAGNOSIS — M7989 Other specified soft tissue disorders: Secondary | ICD-10-CM | POA: Diagnosis not present

## 2024-04-08 ENCOUNTER — Other Ambulatory Visit: Payer: Self-pay | Admitting: Family Medicine

## 2024-04-08 DIAGNOSIS — Z515 Encounter for palliative care: Secondary | ICD-10-CM

## 2024-04-08 DIAGNOSIS — G8929 Other chronic pain: Secondary | ICD-10-CM

## 2024-04-08 NOTE — Telephone Encounter (Unsigned)
 Copied from CRM #[id number]. Topic: Clinical - Medication Refill >> Apr 08, 2024  1:55 PM Amy B wrote: Medication: oxyCODONE  (ROXICODONE ) 15 MG immediate release tablet  Has the patient contacted their pharmacy? Yes (Agent: If no, request that the patient contact the pharmacy for the refill. If patient does not wish to contact the pharmacy document the reason why and proceed with request.) (Agent: If yes, when and what did the pharmacy advise?)  This is the patient's preferred pharmacy:  [id number] [place], [place] - [address] [place] [address] Phone: [phone] Fax: [phone]  Is this the correct pharmacy for this prescription? Yes If no, delete pharmacy and type the correct one.   Has the prescription been filled recently? No  Is the patient out of the medication? No  Has the patient been seen for an appointment in the last year OR does the patient have an upcoming appointment? Yes  Can we respond through MyChart? No  Agent: Please be advised that Rx refills may take up to 3 business days. We ask that you follow-up with your pharmacy.

## 2024-04-08 NOTE — Telephone Encounter (Unsigned)
 Copied from CRM #[id number]. Topic: Clinical - Medication Refill >> Apr 08, 2024  7:56 AM Berwyn MATSU wrote: Medication: oxyCODONE  (ROXICODONE ) 15 MG immediate release tablet   Has the patient contacted their pharmacy? Yes (Agent: If no, request that the patient contact the pharmacy for the refill. If patient does not wish to contact the pharmacy document the reason why and proceed with request.) (Agent: If yes, when and what did the pharmacy advise?)  This is the patient's preferred pharmacy:  [id number] [place], [place] - [address] [place] [address] Phone: [phone] Fax: [phone]    Is this the correct pharmacy for this prescription? Yes If no, delete pharmacy and type the correct one.   Has the prescription been filled recently? Yes  Is the patient out of the medication? No  Has the patient been seen for an appointment in the last year OR does the patient have an upcoming appointment? Yes  Can we respond through MyChart? no  Agent: Please be advised that Rx refills may take up to 3 business days. We ask that you follow-up with your pharmacy.

## 2024-04-09 ENCOUNTER — Telehealth: Payer: Self-pay | Admitting: Family Medicine

## 2024-04-09 MED ORDER — OXYCODONE HCL 15 MG PO TABS
15.0000 mg | ORAL_TABLET | ORAL | 0 refills | Status: DC | PRN
Start: 2024-04-09 — End: 2024-04-16

## 2024-04-09 NOTE — Telephone Encounter (Signed)
 Refilled in a separate encounter.

## 2024-04-09 NOTE — Telephone Encounter (Signed)
 Copied from CRM #[id number]. Topic: Clinical - Medication Question >> Apr 09, 2024 10:10 AM Shanda MATSU wrote: Reason for CRM: Patient called to check status of med refill for me, oxyCODONE  (ROXICODONE ) 15 MG immediate release tablet, adv patient that req was sent on 04/08/24, TAT can take up to 3 bus days, patient would like for req to be sent to provider or nurse adv if it can be refilled ASAP so that he will not start having symptoms, adv patient that I will send this info over but there is no guarantee that this can be done sooner than the 3 bus days.

## 2024-04-09 NOTE — Telephone Encounter (Signed)
 Pt called to report that he is completely out of his current supply, is hoping to receive this prior to feeling symptoms.

## 2024-04-09 NOTE — Telephone Encounter (Signed)
 Please let him know rx sent to the pharmacy.   We received 2 refill requests and 1 phone call just FYI  [name], DO [facility] 04/09/2024, 10:50 AM

## 2024-04-12 ENCOUNTER — Other Ambulatory Visit: Payer: Self-pay | Admitting: Family Medicine

## 2024-04-12 DIAGNOSIS — K7031 Alcoholic cirrhosis of liver with ascites: Secondary | ICD-10-CM

## 2024-04-15 NOTE — Telephone Encounter (Signed)
 Too soon for refill.  Requested Prescriptions  Pending Prescriptions Disp Refills   lactulose  (CHRONULAC ) 10 GM/15ML solution [Pharmacy Med Name: LACTULOSE  10 GM/15 ML SOLUTION]  2    Sig: TAKE 67.5 MLS (45 G TOTAL) BY MOUTH 2 (TWO) TIMES DAILY.     Gastroenterology:  Laxatives - lactulose  Failed - 04/15/2024 11:05 AM      Failed - CO2 in normal range and within 360 days    CO2  Date Value Ref Range Status  03/30/2024 19 (L) 22 - 32 mmol/L Final         Passed - Cl in normal range and within 360 days    Chloride  Date Value Ref Range Status  03/30/2024 105 98 - 111 mmol/L Final         Passed - K in normal range and within 360 days    Potassium  Date Value Ref Range Status  03/30/2024 4.0 3.5 - 5.1 mmol/L Final         Passed - Na in normal range and within 360 days    Sodium  Date Value Ref Range Status  03/30/2024 136 135 - 145 mmol/L Final         Passed - Valid encounter within last 12 months    Recent Outpatient Visits           1 month ago Chronic foot pain, right   [place] [facility] [place], Marsa PARAS, DO   2 months ago Chronic foot pain, right   [place] [facility] [place], Marsa PARAS, DO   7 months ago Edema of right lower extremity   [place] [facility] [place], Marsa PARAS, DO   8 months ago Alcoholic cirrhosis of liver with ascites [facility])   [place] [facility] [place], Marsa PARAS, [place]

## 2024-04-16 ENCOUNTER — Other Ambulatory Visit: Payer: Self-pay | Admitting: Family Medicine

## 2024-04-16 DIAGNOSIS — Z515 Encounter for palliative care: Secondary | ICD-10-CM

## 2024-04-16 DIAGNOSIS — G8929 Other chronic pain: Secondary | ICD-10-CM

## 2024-04-16 NOTE — Telephone Encounter (Unsigned)
 Copied from CRM [id number]. Topic: Clinical - Medication Refill >> Apr 16, 2024  8:29 AM Tom [name] wrote: Medication: oxyCODONE  (ROXICODONE ) 15 MG immediate release tablet  Has the patient contacted their pharmacy? Yes (Agent: If no, request that the patient contact the pharmacy for the refill. If patient does not wish to contact the pharmacy document the reason why and proceed with request.) (Agent: If yes, when and what did the pharmacy advise?)  This is the patient's preferred pharmacy:  [id number] [place],  - [address] [place] [address] Phone: [phone] Fax: [phone]  Is this the correct pharmacy for this prescription? Yes If no, delete pharmacy and type the correct one.   Has the prescription been filled recently? Yes, every 10 days  Is the patient out of the medication? Yes  Has the patient been seen for an appointment in the last year OR does the patient have an upcoming appointment? Yes  Can we respond through MyChart? No  Agent: Please be advised that Rx refills may take up to 3 business days. We ask that you follow-up with your pharmacy.

## 2024-04-17 MED ORDER — OXYCODONE HCL 15 MG PO TABS
15.0000 mg | ORAL_TABLET | ORAL | 0 refills | Status: DC | PRN
Start: 2024-04-17 — End: 2024-04-26

## 2024-04-26 ENCOUNTER — Other Ambulatory Visit: Payer: Self-pay | Admitting: Family Medicine

## 2024-04-26 ENCOUNTER — Telehealth: Payer: Self-pay

## 2024-04-26 DIAGNOSIS — L299 Pruritus, unspecified: Secondary | ICD-10-CM

## 2024-04-26 DIAGNOSIS — G8929 Other chronic pain: Secondary | ICD-10-CM

## 2024-04-26 DIAGNOSIS — Z515 Encounter for palliative care: Secondary | ICD-10-CM

## 2024-04-26 MED ORDER — HYDROXYZINE PAMOATE 25 MG PO CAPS: 25.0000 mg | ORAL_CAPSULE | Freq: Three times a day (TID) | ORAL | 0 refills | Status: AC | PRN

## 2024-04-26 MED ORDER — OXYCODONE HCL 15 MG PO TABS
15.0000 mg | ORAL_TABLET | ORAL | 0 refills | Status: DC | PRN
Start: 2024-04-29 — End: 2024-05-06

## 2024-04-26 NOTE — Telephone Encounter (Signed)
 Refill sent to pharmacy. Please let him know that pick up date is Monday 11/10. That is 10 days from the last order.  [name], DO [facility] 04/26/2024, 1:27 PM

## 2024-04-26 NOTE — Telephone Encounter (Signed)
 Copied from CRM #[id number]. Topic: Clinical - Medication Refill >> Apr 26, 2024  8:04 AM Emylou G wrote: Medication: oxyCODONE  (ROXICODONE ) 15 MG immediate release tablet  Has the patient contacted their pharmacy? Yes (Agent: If no, request that the patient contact the pharmacy for the refill. If patient does not wish to contact the pharmacy document the reason why and proceed with request.) (Agent: If yes, when and what did the pharmacy advise?)  This is the patient's preferred pharmacy:  [id number] [place], [place] - [address] [place] [address] Phone: [phone] Fax: [phone]   Is this the correct pharmacy for this prescription? Yes If no, delete pharmacy and type the correct one.   Has the prescription been filled recently? Yes  Is the patient out of the medication? Yes  Has the patient been seen for an appointment in the last year OR does the patient have an upcoming appointment? Yes  Can we respond through MyChart? no  Agent: Please be advised that Rx refills may take up to 3 business days. We ask that you follow-up with your pharmacy.

## 2024-04-26 NOTE — Telephone Encounter (Signed)
 Copied from CRM #[id number]. Topic: Clinical - Medication Refill >> Apr 26, 2024 12:49 PM Victoria B wrote: Medication: hydrOXYzine  (VISTARIL ) 25 MG capsule  Has the patient contacted their pharmacy? no  (Agent: If yes, when and what did the pharmacy advise?)contact pcp  This is the patient's preferred pharmacy:  [id number] [place], [place] - [address] [place] [address] Phone: [phone] Fax: [phone]   Is this the correct pharmacy for this prescription? yes  Has the prescription been filled recently? no  Is the patient out of the medication? yes  Has the patient been seen for an appointment in the last year OR does the patient have an upcoming appointment? yes  Can we respond through MyChart? yes  Agent: Please be advised that Rx refills may take up to 3 business days. We ask that you follow-up with your pharmacy.

## 2024-04-26 NOTE — Telephone Encounter (Signed)
 Copied from CRM #[id number]. Topic: Compliment - Provider (non-sensitive) >> Apr 26, 2024  8:05 AM Emylou G wrote: Date of encounter: n/a Details of compliment: 11/7 Who would the patient like to thank/see rewarded? Thank you On a scale of 1-10, how was your experience? N/a  Patient just wanted to relay .Tom [name] To think Dr MARLA for looking at for him.  Route to Research Officer, [employer].

## 2024-04-29 ENCOUNTER — Encounter: Payer: Self-pay | Admitting: Family Medicine

## 2024-04-29 ENCOUNTER — Ambulatory Visit: Admitting: Family Medicine

## 2024-04-29 ENCOUNTER — Ambulatory Visit: Payer: Self-pay

## 2024-04-29 VITALS — BP 138/68 | HR 89 | Ht 72.0 in | Wt 213.5 lb

## 2024-04-29 DIAGNOSIS — K4090 Unilateral inguinal hernia, without obstruction or gangrene, not specified as recurrent: Secondary | ICD-10-CM | POA: Diagnosis not present

## 2024-04-29 DIAGNOSIS — Z0279 Encounter for issue of other medical certificate: Secondary | ICD-10-CM

## 2024-04-29 NOTE — Telephone Encounter (Signed)
 FYI Only or Action Required?: FYI only for provider: appointment scheduled on 04/29/2024 at 2 PM.  Patient was last seen in primary care on 03/11/2024 by Edman Marsa PARAS, DO.  Called Nurse Triage reporting Abdominal Pain.  Symptoms began several weeks ago.  Interventions attempted: Rest, hydration, or home remedies.  Symptoms are: unchanged.  Triage Disposition: See HCP Within 4 Hours (Or PCP Triage)  Patient/caregiver understands and will follow disposition?: Yes  Copied from CRM #[id number]. Topic: Clinical - Red Word Triage >> Apr 29, 2024 11:35 AM Dedra NOVAK wrote: Red Word that prompted transfer to Nurse Triage: Pt thinks he has a hiatal hernia. He said that it hurts bad and keep getting bigger. Warm transfer to NT Reason for Disposition  [1] MILD-MODERATE pain AND [2] constant AND [3] age > 60 years  Answer Assessment - Initial Assessment Questions Pain to right groin area which has been going on for four to five months. Patient states pain has increased. Requesting to be seen in the office. Scheduled to see PCP today at 2 PM  1. LOCATION: Where does it hurt?      Right groin area 2. RADIATION: Does the pain shoot anywhere else? (e.g., chest, back)     No radiation 3. ONSET: When did the pain begin? (Minutes, hours or days ago)      4 to 5 months 4. SUDDEN: Gradual or sudden onset?     gradual 5. PATTERN Does the pain come and go, or is it constant?     Comes and goes 6. SEVERITY: How bad is the pain?  (e.g., Scale 1-10; mild, moderate, or severe)     No pain currently 7. RECURRENT SYMPTOM: Have you ever had this type of stomach pain before? If Yes, ask: When was the last time? and What happened that time?      yes 8. CAUSE: What do you think is causing the stomach pain? (e.g., gallstones, recent abdominal surgery)     Hiatal hernia 9. RELIEVING/AGGRAVATING FACTORS: What makes it better or worse? (e.g., antacids, bending or twisting motion,  bowel movement)     Laying down helps pain better 10. OTHER SYMPTOMS: Do you have any other symptoms? (e.g., back pain, diarrhea, fever, urination pain, vomiting)       no  Protocols used: Abdominal Pain - Male-A-AH

## 2024-04-29 NOTE — Patient Instructions (Addendum)
 Thank you for coming to the office today.  Referral sent back to return to Dr Jerilee   Progression or worsening of the Right inguinal hernia.  [facility] [address] [place], [place] [address]  Appointments: [phone] Nurse line: [phone]    Please schedule a Follow-up Appointment to: Return if symptoms worsen or fail to improve.  If you have any other questions or concerns, please feel free to call the office or send a message through MyChart. You may also schedule an earlier appointment if necessary.  Additionally, you may be receiving a survey about your experience at our office within a few days to 1 week by e-mail or mail. We value your feedback.  [name], DO [facility], [place]

## 2024-04-29 NOTE — Progress Notes (Signed)
 Subjective:    Patient ID: Tom [name], male    DOB: August 09, 1965, 58 y.o.   MRN: [id number]  Tom [name] is a 58 y.o. male presenting on 04/29/2024 for Hernia  Patient presents for a same day appointment.   HPI   Discussed the use of AI scribe software for clinical note transcription with the patient, who gave verbal consent to proceed.  History of Present Illness   Tom [name] is a 58 year old male with a right inguinal hernia who presents with worsening pain and swelling.  Right inguinal hernia  - Right inguinal hernia initially evaluated in March 2025 Dr Jerilee [facility] - Hernia has increased in size since initial evaluation - Significant pain, described as severe and requiring assistance to get to bed - Hernia reducible and Pain partially relieved by lying flat on his back with knees up - Swelling in the right inguinal region has become more persistent and no longer subsides as before - Surgeon previously described the hernia as a 'small hole' and mentioned 'leaking fluid' - He was not candidate for surgery at that time - Currently managing pain with medication for pain, for his chronic orthopedic issues  Cirrhosis, Alcoholic / Hepatic dysfunction Followed by [facility] - Sober for three years, with perceived improvement in overall health and nutrition since cessation of alcohol use - Nutrition improved overall  Functional limitations and social impact - He is disabled - Previously worked in tree cutting but unable to continue due to current health limitations - Requires assistance from caregiver Sheldon) for mobility and activities of daily living and controlled substance administration, he is asking for paperwork for NCLIFTSS / CAP program - Interested in insurance coverage options, including Medicaid and Medicare, and seeking assistance with insurance paperwork for caregiver support           05/16/2023    5:40  PM 02/22/2023    2:40 PM 03/21/2022    2:56 PM  Depression screen PHQ 2/9  Decreased Interest 0 0 0  Down, Depressed, Hopeless 0 0 0  PHQ - 2 Score 0 0 0  Altered sleeping 0  0  Tired, decreased energy 0  0  Change in appetite 0  0  Feeling bad or failure about yourself  0  0  Trouble concentrating 0  0  Moving slowly or fidgety/restless 0  0  Suicidal thoughts 0  0  PHQ-9 Score 0   0   Difficult doing work/chores Not difficult at all  Not difficult at all     Data saved with a previous flowsheet row definition       09/21/2022   11:26 AM 12/16/2020    3:16 PM  GAD 7 : Generalized Anxiety Score  Nervous, Anxious, on Edge 0 0  Control/stop worrying 0 0  Worry too much - different things 0 0  Trouble relaxing 0 0  Restless 0 0  Easily annoyed or irritable 0 0  Afraid - awful might happen 0 0  Total GAD 7 Score 0 0  Anxiety Difficulty Not difficult at all Not difficult at all    Social History   Tobacco Use   Smoking status: Former    Current packs/day: 0.00    Average packs/day: 1 pack/day for 39.0 years (39.0 ttl pk-yrs)    Types: Cigarettes    Start date: 12/1976    Quit date: 12/2015    Years since quitting: 8.3   Smokeless tobacco: Never  Vaping Use   Vaping status: Never Used  Substance Use Topics   Alcohol use: No   Drug use: Yes    Types: Marijuana    Review of Systems Per HPI unless specifically indicated above     Objective:    BP 138/68 (BP Location: Left Arm, Cuff Size: Normal)   Pulse 89   Ht 6' (1.829 m)   Wt 213 lb 8 oz (96.8 kg)   SpO2 95%   BMI 28.96 kg/m   Wt Readings from Last 3 Encounters:  04/29/24 213 lb 8 oz (96.8 kg)  03/30/24 196 lb (88.9 kg)  02/05/24 203 lb (92.1 kg)    Physical Exam Vitals and nursing note reviewed.  Constitutional:      General: He is not in acute distress.    Appearance: Normal appearance. He is well-developed. He is not diaphoretic.     Comments: Well-appearing, comfortable, cooperative  HENT:      Head: Normocephalic and atraumatic.  Eyes:     General:        Right eye: No discharge.        Left eye: No discharge.     Conjunctiva/sclera: Conjunctivae normal.  Cardiovascular:     Rate and Rhythm: Normal rate.  Pulmonary:     Effort: Pulmonary effort is normal.  Abdominal:     Hernia: A hernia (R inguinal with large bulging reducible hernia on exam) is present.  Skin:    General: Skin is warm and dry.     Findings: No erythema or rash.  Neurological:     Mental Status: He is alert and oriented to person, place, and time.  Psychiatric:        Mood and Affect: Mood normal.        Behavior: Behavior normal.        Thought Content: Thought content normal.     Comments: Well groomed, good eye contact, normal speech and thoughts     Results for orders placed or performed during the hospital encounter of 03/30/24  Comprehensive metabolic panel   Collection Time: 03/30/24  7:24 AM  Result Value Ref Range   Sodium 136 135 - 145 mmol/L   Potassium 4.0 3.5 - 5.1 mmol/L   Chloride 105 98 - 111 mmol/L   CO2 19 (L) 22 - 32 mmol/L   Glucose, Bld 98 70 - 99 mg/dL   BUN 17 6 - 20 mg/dL   Creatinine, Ser 9.02 0.61 - 1.24 mg/dL   Calcium  9.2 8.9 - 10.3 mg/dL   Total Protein 6.8 6.5 - 8.1 g/dL   Albumin  3.1 (L) 3.5 - 5.0 g/dL   AST 28 15 - 41 U/L   ALT 19 0 - 44 U/L   Alkaline Phosphatase 92 38 - 126 U/L   Total Bilirubin 1.2 0.0 - 1.2 mg/dL   GFR, Estimated >39 >39 mL/min   Anion gap 12 5 - 15  CBC with Differential   Collection Time: 03/30/24  7:24 AM  Result Value Ref Range   WBC 7.5 4.0 - 10.5 K/uL   RBC 4.59 4.22 - 5.81 MIL/uL   Hemoglobin 12.7 (L) 13.0 - 17.0 g/dL   HCT 59.8 60.9 - 47.9 %   MCV 87.4 80.0 - 100.0 fL   MCH 27.7 26.0 - 34.0 pg   MCHC 31.7 30.0 - 36.0 g/dL   RDW 83.8 (H) 88.4 - 84.4 %   Platelets 273 150 - 400 K/uL   nRBC 0.0 0.0 - 0.2 %  Neutrophils Relative % 45 %   Neutro Abs 3.4 1.7 - 7.7 K/uL   Lymphocytes Relative 31 %   Lymphs Abs 2.3 0.7 - 4.0  K/uL   Monocytes Relative 15 %   Monocytes Absolute 1.1 (H) 0.1 - 1.0 K/uL   Eosinophils Relative 7 %   Eosinophils Absolute 0.5 0.0 - 0.5 K/uL   Basophils Relative 2 %   Basophils Absolute 0.1 0.0 - 0.1 K/uL   Immature Granulocytes 0 %   Abs Immature Granulocytes 0.02 0.00 - 0.07 K/uL  Lactic acid, plasma   Collection Time: 03/30/24  7:24 AM  Result Value Ref Range   Lactic Acid, Venous 0.9 0.5 - 1.9 mmol/L      Assessment & Plan:   Problem List Items Addressed This Visit   None Visit Diagnoses       Right inguinal hernia    -  Primary   Relevant Orders   Ambulatory referral to General Surgery        Right Inguinal hernia, worsening Last evaluation 08/2023, sent to Dr Jerilee [facility] [place], not candidate for surgery at that time. Now today with progression worsening R inguinal hernia over past 6 months It is still reducible, best if patient laying on back he is able to reduce it but having more frequent painful flares  Palpable hernia causing significant discomfort. Surgical intervention desired now. Note his chronic history with Cirrhosis, has been stable and improved now, followed by [facility] Hepat[facility]  Referral to return to  Dr Jerilee  [facility] [address] [place], [place] [address] Appointments: [phone] Nurse line: [phone]  Will complete paperwork for CAP / NCLIFTSS and return to patient to submit. He is looking for caregiver assistance  Orders Placed This Encounter  Procedures   Ambulatory referral to General Surgery    Referral Priority:   Routine    Referral Type:   Surgical    Referral Reason:   Specialty Services Required    Requested Specialty:   General Surgery    Number of Visits Requested:   1    No orders of the defined types were placed in this encounter.   Follow up plan: Return if symptoms worsen or fail to improve.  [name], DO [facility] 04/29/2024, 2:14 PM

## 2024-05-02 ENCOUNTER — Encounter: Payer: Self-pay | Admitting: Family Medicine

## 2024-05-02 DIAGNOSIS — Z981 Arthrodesis status: Secondary | ICD-10-CM | POA: Insufficient documentation

## 2024-05-02 DIAGNOSIS — K766 Portal hypertension: Secondary | ICD-10-CM | POA: Diagnosis not present

## 2024-05-02 DIAGNOSIS — K4090 Unilateral inguinal hernia, without obstruction or gangrene, not specified as recurrent: Secondary | ICD-10-CM | POA: Diagnosis not present

## 2024-05-06 ENCOUNTER — Telehealth: Payer: Self-pay

## 2024-05-06 ENCOUNTER — Telehealth: Payer: Self-pay | Admitting: Family Medicine

## 2024-05-06 DIAGNOSIS — K766 Portal hypertension: Secondary | ICD-10-CM | POA: Diagnosis not present

## 2024-05-06 DIAGNOSIS — B379 Candidiasis, unspecified: Secondary | ICD-10-CM | POA: Diagnosis not present

## 2024-05-06 DIAGNOSIS — Z515 Encounter for palliative care: Secondary | ICD-10-CM

## 2024-05-06 DIAGNOSIS — Z87891 Personal history of nicotine dependence: Secondary | ICD-10-CM | POA: Diagnosis not present

## 2024-05-06 DIAGNOSIS — K7460 Unspecified cirrhosis of liver: Secondary | ICD-10-CM | POA: Diagnosis not present

## 2024-05-06 DIAGNOSIS — K4091 Unilateral inguinal hernia, without obstruction or gangrene, recurrent: Secondary | ICD-10-CM | POA: Diagnosis not present

## 2024-05-06 DIAGNOSIS — J440 Chronic obstructive pulmonary disease with acute lower respiratory infection: Secondary | ICD-10-CM | POA: Diagnosis not present

## 2024-05-06 DIAGNOSIS — G8929 Other chronic pain: Secondary | ICD-10-CM

## 2024-05-06 DIAGNOSIS — K4090 Unilateral inguinal hernia, without obstruction or gangrene, not specified as recurrent: Secondary | ICD-10-CM | POA: Diagnosis not present

## 2024-05-06 MED ORDER — OXYCODONE HCL 15 MG PO TABS: 15.0000 mg | ORAL_TABLET | ORAL | 0 refills | Status: DC | PRN

## 2024-05-06 NOTE — Telephone Encounter (Signed)
 Copied from CRM #[id number]. Topic: Clinical - Medication Refill >> May 06, 2024 10:34 AM Leonette P wrote: Medication: Oxycodone  15 mg  Has the patient contacted their pharmacy? No (Agent: If no, request that the patient contact the pharmacy for the refill. If patient does not wish to contact the pharmacy document the reason why and proceed with request.) (Agent: If yes, when and what did the pharmacy advise?)  This is the patient's preferred pharmacy:  [id number] - [place], [place] - [address] [place] [address] Phone: [phone] Fax: [phone]   Is this the correct pharmacy for this prescription? Yes If no, delete pharmacy and type the correct one.   Has the prescription been filled recently? Yes  Is the patient out of the medication? Yes  Has the patient been seen for an appointment in the last year OR does the patient have an upcoming appointment? Yes  Can we respond through MyChart? No  Agent: Please be advised that Rx refills may take up to 3 business days. We ask that you follow-up with your pharmacy.

## 2024-05-06 NOTE — Telephone Encounter (Signed)
 Copied from CRM #[id number]. Topic: Clinical - Medical Advice >> May 06, 2024 10:36 AM Leonette SQUIBB wrote: Reason for CRM: patient wanted to let Dr. MARLA know he is going to [facility] for a hernia that is bothering him.  He said he will have them forward the information to Dr. MARLA.  RICK

## 2024-05-06 NOTE — Telephone Encounter (Signed)
 Copied from CRM #[id number]. Topic: Clinical - Medication Question >> May 06, 2024  3:22 PM Olam RAMAN wrote: Reason for CRM: pt was calling if he can take the prescription the ER prescribed to him at the ER oxycodone  . CB [phone]

## 2024-05-06 NOTE — Telephone Encounter (Signed)
 Spoke with patient notified he can take the prescription from the ER. He was also notified that he needs to follow up back up with orthopedic specialists.

## 2024-05-06 NOTE — Telephone Encounter (Signed)
 Please call patient to let him know his next rx pain medicine oxycodone  should be ready on Thursday 11/20, that is 10 days since last fill on 04/29/24  [name], DO [facility] 05/06/2024, 1:00 PM

## 2024-05-06 NOTE — Telephone Encounter (Signed)
 Yes, he can take the Oxycodone  given by the ER. We called him and advised him. See other phone notes for same topic today  [name], DO [facility] 05/06/2024, 4:47 PM

## 2024-05-06 NOTE — Telephone Encounter (Unsigned)
 Copied from CRM #[id number]. Topic: Clinical - Prescription Issue >> May 06, 2024  2:40 PM Darshell M wrote: Reason for RMF:Ejupzwu has pain meds agreement with Dr. Edman. Oxycodone  prescription will not be ready until 11/20. Patient is in ER and would like pain meds prescription to carry him through until Thursday. Patient wants permission from Dr. Edman before getting a prescription from the ER provider. Patient CB [phone].

## 2024-05-06 NOTE — Telephone Encounter (Signed)
 He does not need permission from me to go to hospital and receive treatment from ER. The controlled substance agreement is primarily for avoiding patients from going to other regular doctors to receive pain medications, the ER is okay if he needs to go.  If he is out of meds in 7 days that means he is taking it too often. There is nothing else I can do to order more meds sooner unfortunately.  Also, please remind him that I cannot manage this level of pain long term. He will need to go to his [facility] Orthopedic specialist for further treatment options.  [name], DO [facility] 05/06/2024, 4:02 PM

## 2024-05-07 ENCOUNTER — Ambulatory Visit: Payer: Self-pay | Admitting: *Deleted

## 2024-05-07 ENCOUNTER — Ambulatory Visit: Payer: Self-pay

## 2024-05-07 NOTE — Telephone Encounter (Signed)
 FYI Only or Action Required?: Action required by provider: request for appointment, referral request, update on patient condition, and help to wean off of pain medication .  Patient was last seen in primary care on 04/29/2024 by Edman Marsa PARAS, DO.  Called Nurse Triage reporting Pain.  Symptoms began a week ago.  Interventions attempted: Prescription medications: percocet from hospital visit for approx 2 pills.  Symptoms are: rapidly worsening.  Triage Disposition: See HCP Within 4 Hours (Or PCP Triage)  Patient/caregiver understands and will follow disposition?: No, wishes to speak with PCP   Patient requesting call back with what his options are for medication today.           Copied from CRM [id number]. Topic: Clinical - Red Word Triage >> May 07, 2024  8:50 AM Darshell M wrote: Red Word that prompted transfer to Nurse Triage: Patient experiencing withdrawal from prescribed medications. Reason for Disposition  [1] SEVERE pain (e.g., excruciating, unable to do any normal activities) AND [2] not improved after 2 hours of pain medicine  Answer Assessment - Initial Assessment Questions Patient requesting if PCP will see him and prescribe medication for pain. Reports he went to ED yesterday. Took last pill this am. Patient wants to wean off of medications but is in too much pain in right foot and right groin. Requesting pain clinic referral but requesting if enough medication can be given until appt for pain clinic. Recommended if pain severe patient referred back to ED. Patient requesting call back if earlier appt can be scheduled with Dr. MARLA. Hospital f/u appt scheduled for 05/09/24        1. ONSET: When did the pain start?      Sunday, last week  2. LOCATION: Where is the pain located?      Groin pain right side and right leg / foot.  3. PAIN: How bad is the pain?    (Scale 1-10; or mild, moderate, severe)     10 /10 4. WORK OR EXERCISE: Has there been  any recent work or exercise that involved this part of the body?      Has given out of oxycodone  5. CAUSE: What do you think is causing the leg pain?     Scar tissue  6. OTHER SYMPTOMS: Do you have any other symptoms? (e.g., chest pain, back pain, breathing difficulty, swelling, rash, fever, numbness, weakness)    Right  Foot yeast infection, bone infection, right groin pain , leg swelling , severe pain , out of oxycodone  and feels like might be withdrawing, denies chest pain no difficulty breathing no fever reported, no sweating no hallucinations no shaking reported at this time. Last oxycodone  taken 15 mg this am.  7. PREGNANCY: Is there any chance you are pregnant? When was your last menstrual period?     na  Protocols used: Leg Pain-A-AH

## 2024-05-07 NOTE — Telephone Encounter (Signed)
 FYI -No need to call him back today. This message is just in case he calls back.  I will see him on 11/20 and place a referral to pain management at that time once I discuss with him further.  [name], DO [facility] 05/07/2024, 12:53 PM

## 2024-05-07 NOTE — Telephone Encounter (Signed)
 FYI Only or Action Required?: Action required by provider: update on patient condition.  Patient was last seen in primary care on 04/29/2024 by Edman Marsa PARAS, DO.  Called Nurse Triage reporting Pain and Alcohol Problem.  Symptoms began today.  Interventions attempted: Nothing.  Symptoms are: unchanged.  Triage Disposition: Go to ED Now (or PCP Triage)  Patient/caregiver understands and will follow disposition?: Unsure   Copied from CRM [id number]. Topic: Clinical - Red Word Triage >> May 07, 2024  3:37 PM Darshell M wrote: Red Word that prompted transfer to Nurse Triage: Patient in withdrawal and drinking. Says he doesn't know what to do. Call dropped and my VDI crashed. Reason for Disposition  Patient sounds very sick or weak to the triager  Answer Assessment - Initial Assessment Questions Patient called with reports of pain stating that he is drinking because he is out of pain medication.   Reports that he has cirrhosis and should not drink He confirmed appointment for 11/20 He will go to ED via EMS if pain becomes uncontrolled  Protocols used: Alcohol Use and Problems-A-AH

## 2024-05-09 ENCOUNTER — Ambulatory Visit: Admitting: Family Medicine

## 2024-05-09 ENCOUNTER — Encounter: Payer: Self-pay | Admitting: Family Medicine

## 2024-05-09 VITALS — BP 146/70 | HR 73 | Ht 72.0 in | Wt 213.0 lb

## 2024-05-09 DIAGNOSIS — M21961 Unspecified acquired deformity of right lower leg: Secondary | ICD-10-CM

## 2024-05-09 DIAGNOSIS — Z515 Encounter for palliative care: Secondary | ICD-10-CM | POA: Diagnosis not present

## 2024-05-09 DIAGNOSIS — M79671 Pain in right foot: Secondary | ICD-10-CM

## 2024-05-09 DIAGNOSIS — M545 Low back pain, unspecified: Secondary | ICD-10-CM

## 2024-05-09 DIAGNOSIS — K4090 Unilateral inguinal hernia, without obstruction or gangrene, not specified as recurrent: Secondary | ICD-10-CM

## 2024-05-09 DIAGNOSIS — G2581 Restless legs syndrome: Secondary | ICD-10-CM

## 2024-05-09 DIAGNOSIS — G8929 Other chronic pain: Secondary | ICD-10-CM

## 2024-05-09 DIAGNOSIS — K7031 Alcoholic cirrhosis of liver with ascites: Secondary | ICD-10-CM | POA: Diagnosis not present

## 2024-05-09 DIAGNOSIS — T8484XA Pain due to internal orthopedic prosthetic devices, implants and grafts, initial encounter: Secondary | ICD-10-CM

## 2024-05-09 MED ORDER — OXYCODONE HCL 10 MG PO TABS: 10.0000 mg | ORAL_TABLET | ORAL | 0 refills | Status: DC | PRN

## 2024-05-09 MED ORDER — BACLOFEN 10 MG PO TABS: 5.0000 mg | ORAL_TABLET | Freq: Three times a day (TID) | ORAL | 2 refills | Status: AC | PRN

## 2024-05-09 NOTE — Patient Instructions (Addendum)
 Thank you for coming to the office today.  We will be reducing the Oxycodone  and tapering off and goal to transition you to Pain Management clinic for further management.  I do recommend that you schedule with [facility] Orthopedic Dr Tisa for further management. I do agree you will need surgery on the ankle / likely amputation for future management.  Start taking Baclofen (Lioresal) 10mg  (muscle relaxant) - start with half (cut) to one whole pill at night as needed for next 1-3 nights (may make you drowsy, caution with driving) see how it affects you, then if tolerated increase to one pill 2 to 3 times a day or (every 8 hours as needed)   Please schedule a Follow-up Appointment to: Return in about 3 weeks (around 05/30/2024) for 3 weeks follow-up Pain med adjust / referral updates.  If you have any other questions or concerns, please feel free to call the office or send a message through MyChart. You may also schedule an earlier appointment if necessary.  Additionally, you may be receiving a survey about your experience at our office within a few days to 1 week by e-mail or mail. We value your feedback.  [name], DO [facility], [place]

## 2024-05-09 NOTE — Progress Notes (Signed)
 Subjective:    Patient ID: Tom [name], male    DOB: June 15, 1966, 58 y.o.   MRN: [id number]  Tom [name] is a 58 y.o. male presenting on 05/09/2024 for Medical Management of Chronic Issues and Pain Management   HPI  Discussed the use of AI scribe software for clinical note transcription with the patient, who gave verbal consent to proceed.  History of Present Illness   Tom [name] is a 58 year old male with rheumatoid arthritis and cirrhosis who presents with chronic pain management issues.  Chronic pain management - Chronic severe pain primarily due to advanced osteoarthritis, post-traumatic injury R foot / ankle with hardware failure and joint instability - Currently taking oxycodone  15 mg, up to seven pills in 24 hours - He has expressed interest now to reduce opioid therapy and is ready to pursue more definitive treatment for his R lower extremity possible surgery amputation - Desires to reduce oxycodone  dosage to 10 mg due to concerns about long-term management and cost  Musculoskeletal symptoms - Severe foot pain and swelling now for >1 year - Unable to wear prescribed boots due to discomfort and swelling - Followed by Orthopedics at [facility] Dr Tisa. Considering surgical amputation to alleviate foot pain - Interested in obtaining a brace for improved management of foot symptoms - Restless legs and sharp pains in legs, especially when pain medication is unavailable  Alcoholic Cirrhosis Hepatic dysfunction - Cirrhosis with perceived impact on memory and overall health - Improved stability now, fluid remains balanced on meds, no further paracentesis - he has remained off alcohol until recent relapse but remains off again  R Inguinal Hernia - Has seen a surgeon regarding hernia but has not received a follow-up appointment  Functional limitations and social impact - He is disabled - Previously worked in tree cutting but unable to continue due to  current health limitations - Requires assistance from caregiver Sheldon) for mobility and activities of daily living and controlled substance administration, we have submitted paperwork in past for [facility] / CAP program        05/09/2024    6:07 PM 05/16/2023    5:40 PM 02/22/2023    2:40 PM  Depression screen PHQ 2/9  Decreased Interest 0 0 0  Down, Depressed, Hopeless 0 0 0  PHQ - 2 Score 0 0 0  Altered sleeping 0 0   Tired, decreased energy 0 0   Change in appetite 0 0   Feeling bad or failure about yourself  0 0   Trouble concentrating 0 0   Moving slowly or fidgety/restless 0 0   Suicidal thoughts 0 0   PHQ-9 Score 0 0    Difficult doing work/chores Not difficult at all Not difficult at all      Data saved with a previous flowsheet row definition       09/21/2022   11:26 AM 12/16/2020    3:16 PM  GAD 7 : Generalized Anxiety Score  Nervous, Anxious, on Edge 0 0  Control/stop worrying 0 0  Worry too much - different things 0 0  Trouble relaxing 0 0  Restless 0 0  Easily annoyed or irritable 0 0  Afraid - awful might happen 0 0  Total GAD 7 Score 0 0  Anxiety Difficulty Not difficult at all Not difficult at all    Social History   Tobacco Use   Smoking status: Former    Current packs/day: 0.00    Average packs/day: 1  pack/day for 39.0 years (39.0 ttl pk-yrs)    Types: Cigarettes    Start date: 12/1976    Quit date: 12/2015    Years since quitting: 8.3   Smokeless tobacco: Never  Vaping Use   Vaping status: Never Used  Substance Use Topics   Alcohol use: No   Drug use: Yes    Types: Marijuana    Review of Systems Per HPI unless specifically indicated above     Objective:    BP (!) 146/70 (BP Location: Left Arm, Cuff Size: Normal)   Pulse 73   Ht 6' (1.829 m)   Wt 213 lb (96.6 kg)   SpO2 95%   BMI 28.89 kg/m   Wt Readings from Last 3 Encounters:  05/09/24 213 lb (96.6 kg)  04/29/24 213 lb 8 oz (96.8 kg)  03/30/24 196 lb (88.9 kg)    Physical  Exam Vitals and nursing note reviewed.  Constitutional:      General: He is not in acute distress.    Appearance: Normal appearance. He is well-developed. He is not diaphoretic.     Comments: Well-appearing, uncomfortable, cooperative  HENT:     Head: Normocephalic and atraumatic.  Eyes:     General:        Right eye: No discharge.        Left eye: No discharge.     Conjunctiva/sclera: Conjunctivae normal.  Cardiovascular:     Rate and Rhythm: Normal rate.  Pulmonary:     Effort: Pulmonary effort is normal.  Musculoskeletal:     Right lower leg: Edema (R foot ankle significant edema deformity) present.     Comments: Using cane, limited weight bear on R lower extremity. Deformity  Skin:    General: Skin is warm and dry.     Findings: No erythema or rash.  Neurological:     Mental Status: He is alert and oriented to person, place, and time.  Psychiatric:        Mood and Affect: Mood normal.        Behavior: Behavior normal.        Thought Content: Thought content normal.     Comments: Well groomed, good eye contact, normal speech and thoughts     Results for orders placed or performed during the hospital encounter of 03/30/24  Comprehensive metabolic panel   Collection Time: 03/30/24  7:24 AM  Result Value Ref Range   Sodium 136 135 - 145 mmol/L   Potassium 4.0 3.5 - 5.1 mmol/L   Chloride 105 98 - 111 mmol/L   CO2 19 (L) 22 - 32 mmol/L   Glucose, Bld 98 70 - 99 mg/dL   BUN 17 6 - 20 mg/dL   Creatinine, Ser 9.02 0.61 - 1.24 mg/dL   Calcium  9.2 8.9 - 10.3 mg/dL   Total Protein 6.8 6.5 - 8.1 g/dL   Albumin  3.1 (L) 3.5 - 5.0 g/dL   AST 28 15 - 41 U/L   ALT 19 0 - 44 U/L   Alkaline Phosphatase 92 38 - 126 U/L   Total Bilirubin 1.2 0.0 - 1.2 mg/dL   GFR, Estimated >39 >39 mL/min   Anion gap 12 5 - 15  CBC with Differential   Collection Time: 03/30/24  7:24 AM  Result Value Ref Range   WBC 7.5 4.0 - 10.5 K/uL   RBC 4.59 4.22 - 5.81 MIL/uL   Hemoglobin 12.7 (L) 13.0 -  17.0 g/dL   HCT 59.8 60.9 - 47.9 %  MCV 87.4 80.0 - 100.0 fL   MCH 27.7 26.0 - 34.0 pg   MCHC 31.7 30.0 - 36.0 g/dL   RDW 83.8 (H) 88.4 - 84.4 %   Platelets 273 150 - 400 K/uL   nRBC 0.0 0.0 - 0.2 %   Neutrophils Relative % 45 %   Neutro Abs 3.4 1.7 - 7.7 K/uL   Lymphocytes Relative 31 %   Lymphs Abs 2.3 0.7 - 4.0 K/uL   Monocytes Relative 15 %   Monocytes Absolute 1.1 (H) 0.1 - 1.0 K/uL   Eosinophils Relative 7 %   Eosinophils Absolute 0.5 0.0 - 0.5 K/uL   Basophils Relative 2 %   Basophils Absolute 0.1 0.0 - 0.1 K/uL   Immature Granulocytes 0 %   Abs Immature Granulocytes 0.02 0.00 - 0.07 K/uL  Lactic acid, plasma   Collection Time: 03/30/24  7:24 AM  Result Value Ref Range   Lactic Acid, Venous 0.9 0.5 - 1.9 mmol/L      Assessment & Plan:   Problem List Items Addressed This Visit     Alcoholic cirrhosis of liver with ascites (HCC) - Primary   Chronic back pain   Relevant Medications   Oxycodone  HCl 10 MG TABS   baclofen (LIORESAL) 10 MG tablet   Other Visit Diagnoses       Palliative care patient       Relevant Medications   Oxycodone  HCl 10 MG TABS     Chronic foot pain, right       Relevant Medications   Oxycodone  HCl 10 MG TABS   baclofen (LIORESAL) 10 MG tablet   Other Relevant Orders   Ambulatory referral to Pain Clinic     Right inguinal hernia         Restless leg syndrome       Relevant Medications   baclofen (LIORESAL) 10 MG tablet     Foot deformity, acquired, right       Relevant Orders   Ambulatory referral to Pain Clinic     Painful orthopaedic hardware       Relevant Orders   Ambulatory referral to Pain Clinic        Alcoholic cirrhosis of liver with ascites - controlled Chronic condition affecting quality of life with memory issues and fluid retention. Currently managed on medications and diuretic Recent alcohol consumption however he has remained abstinent for long time now with success Followed by [facility] Hepatology  Chronic pain  syndrome with right foot pain and low back pain Chronic pain with Right foot / ankle joint deformity instability following failed orthopedic hardware Limited mobility and lack of weight bearing R foot/ankle, uses cane or walker He has limited ADL function Managed on chronic oxycodone  opioid therapy from PCP office At this point, his Orthopedic treatment plan is now to proceed w/ future R Foot / ankle amputation, and his opioid therapy has helped maintain him but I advised that given higher dose requirements that he is high risk for me to manage his opioids and I would strongly recommend transitioning his pain management to a Pain Clinic through [facility] where his Orthopedics are located as they can work together on future plan involving surgical intervention.  He has been under Opioid Agreement with me and recent behavior with some alcohol intake and requesting refills early and higher dose of medication has prompted breech of contract. I advised that I understand his situation and concerns but managing his pain is difficult at this point as his PCP  and we will begin reducing his Oxycodone  dose in mg and quantity as we transition him to lower amount and refer to Pain Management. He is in agreement with this plan as he prefers to reduce opioid use and pursue more definitive therapy with amputation or other surgical intervention.  Referral to pain management specialist planned. - Reduced oxycodone  dosage from 15 mg to 10 mg. Pill count reduced from 70 to 60 quantity - Referred to pain management specialist in [facility] or [place]. - He may discuss other options w/ Pain Management. Consider Suboxone as a treatment option. - Monitor pain levels and adjust treatment as needed.  Restless legs syndrome Sharp leg pains, especially when pain medication is low. Gabapentin  not tolerated. Baclofen considered safe for liver. - Prescribed baclofen, up to three times a day as needed.  Right Inguinal  Hernia Establish with [facility] for further management, awaiting surgical consultation - Follow up with surgeon for hernia management. - Contact surgeon to schedule appointment.  General Health Maintenance Due for vaccination updates. - Obtain flu and COVID vaccines at pharmacy. - Consider pneumonia vaccine at pharmacy.        Orders Placed This Encounter  Procedures   Ambulatory referral to Pain Clinic    Referral Priority:   Routine    Referral Type:   Consultation    Referral Reason:   Specialty Services Required    Requested Specialty:   Pain Medicine    Number of Visits Requested:   1    Meds ordered this encounter  Medications   Oxycodone  HCl 10 MG TABS    Sig: Take 1 tablet (10 mg total) by mouth every 4 (four) hours as needed (pain).    Dispense:  60 tablet    Refill:  0    Dose reduction   baclofen (LIORESAL) 10 MG tablet    Sig: Take 0.5-1 tablets (5-10 mg total) by mouth 3 (three) times daily as needed for muscle spasms.    Dispense:  90 each    Refill:  2    Follow up plan: Return in about 3 weeks (around 05/30/2024) for 3 weeks follow-up Pain med adjust / referral updates.   [name], DO [facility] 05/09/2024, 9:26 AM

## 2024-05-09 NOTE — Telephone Encounter (Signed)
 Patient seen today. See note office visit for details  [name], DO [facility] 05/09/2024, 2:14 PM

## 2024-05-13 ENCOUNTER — Telehealth: Payer: Self-pay | Admitting: Family Medicine

## 2024-05-13 DIAGNOSIS — G8929 Other chronic pain: Secondary | ICD-10-CM

## 2024-05-13 DIAGNOSIS — M21961 Unspecified acquired deformity of right lower leg: Secondary | ICD-10-CM

## 2024-05-13 DIAGNOSIS — T8484XA Pain due to internal orthopedic prosthetic devices, implants and grafts, initial encounter: Secondary | ICD-10-CM

## 2024-05-13 DIAGNOSIS — M5136 Other intervertebral disc degeneration, lumbar region with discogenic back pain only: Secondary | ICD-10-CM

## 2024-05-13 NOTE — Telephone Encounter (Signed)
 Please call patient to let him know that [facility] is not accepting the Pain Management referral. They state that they cannot accommodate his need at this time and suggest establishing with a local office for pain management.  Unfortunately Pain Management specialists are hard to get into. We have limited options now.  Please let him know that - I will plan to submit a referral to Dr Fernand locally as next option.  Dr Darletta Fernand [facility] [address], [address] [place], [place] [address] Ph: [phone]  Let me know if he has other requests or concerns with this. Otherwise that is how I would proceed for now. The other locations listed below are possible but they are outside of our local area and may be harder for him to travel to.  Other locations outside of local area are in [place] or [facility].  [facility] [place], [place] [address] [place], [place] [address] Phone: [phone]  [facility] [address], [place], [place] [address] Phone: [phone]  [facility] at [facility] [address] [place], [place] [address] Ph: [phone] (Multiple locations in [place] / [place])   [name], DO [facility] 05/13/2024, 7:36 PM

## 2024-05-14 NOTE — Telephone Encounter (Signed)
 Understood. Patient agrees to referral to Dr Fernand.  Routing this note to [facility] for review and for new referral order to Pain Management. Previous referral attempt to [facility] was denied by [facility] they are unable to provide this service to the patient.  New referral entered.  Dr Darletta Fernand [facility] [address], [address] [place], [place] [address] Ph: [phone]  [name], DO [facility] 05/14/2024, 12:42 PM

## 2024-05-14 NOTE — Addendum Note (Signed)
 Addended by: EDMAN MARSA PARAS on: 05/14/2024 12:42 PM   Modules accepted: Orders

## 2024-05-15 DIAGNOSIS — Z9189 Other specified personal risk factors, not elsewhere classified: Secondary | ICD-10-CM | POA: Diagnosis not present

## 2024-05-15 DIAGNOSIS — M009 Pyogenic arthritis, unspecified: Secondary | ICD-10-CM | POA: Diagnosis not present

## 2024-05-15 DIAGNOSIS — L03119 Cellulitis of unspecified part of limb: Secondary | ICD-10-CM | POA: Diagnosis not present

## 2024-05-27 ENCOUNTER — Telehealth: Payer: Self-pay | Admitting: Family Medicine

## 2024-05-27 DIAGNOSIS — M545 Low back pain, unspecified: Secondary | ICD-10-CM

## 2024-05-27 DIAGNOSIS — Z515 Encounter for palliative care: Secondary | ICD-10-CM

## 2024-05-27 DIAGNOSIS — G8929 Other chronic pain: Secondary | ICD-10-CM

## 2024-05-27 NOTE — Telephone Encounter (Unsigned)
 Copied from CRM [id number]. Topic: Clinical - Medication Refill >> May 27, 2024  9:18 AM Viola F wrote: Medication:Oxycodone  HCl 10 MG TABS [[id number]]   Has the patient contacted their pharmacy? Yes (Agent: If no, request that the patient contact the pharmacy for the refill. If patient does not wish to contact the pharmacy document the reason why and proceed with request.) (Agent: If yes, when and what did the pharmacy advise?)  This is the patient's preferred pharmacy:  [id number] [place] [place] - [address] [place] [address] Phone: [phone] Fax: [phone]   Is this the correct pharmacy for this prescription? Yes If no, delete pharmacy and type the correct one.   Has the prescription been filled recently? Yes  Is the patient out of the medication? No  Has the patient been seen for an appointment in the last year OR does the patient have an upcoming appointment? Yes  Can we respond through MyChart? Yes  Agent: Please be advised that Rx refills may take up to 3 business days. We ask that you follow-up with your pharmacy.

## 2024-05-28 ENCOUNTER — Telehealth: Payer: Self-pay

## 2024-05-28 MED ORDER — OXYCODONE HCL 10 MG PO TABS: 10.0000 mg | ORAL_TABLET | ORAL | 0 refills | Status: DC | PRN

## 2024-05-28 NOTE — Telephone Encounter (Signed)
 Do we know anything about his appointment with Dr Fernand?

## 2024-05-28 NOTE — Telephone Encounter (Signed)
 Copied from CRM #[id number]. Topic: Clinical - Medication Question >> May 28, 2024  1:21 PM Anairis L wrote: Reason for CRM: Following up on his oxyCODONE  HCl (10 mg Oral Every 4 hours PRN) refill.  Please give a patient a call when approved.

## 2024-05-28 NOTE — Telephone Encounter (Signed)
 Please call patient let him know [facility] now he can contact pharmacy for pick up later today.  Also, please ask if he has scheduled apt with his new pain management doctor. Dr Fernand in [facility], DO [facility] 05/28/2024, 2:18 PM

## 2024-05-29 ENCOUNTER — Other Ambulatory Visit (HOSPITAL_COMMUNITY): Payer: Self-pay

## 2024-05-29 ENCOUNTER — Telehealth: Payer: Self-pay

## 2024-05-29 NOTE — Telephone Encounter (Signed)
 Pharmacy Patient Advocate Encounter   Received notification from Onbase that prior authorization for Oxycodone  HCL 10 tabs is required/requested.   Insurance verification completed.   The patient is insured through [employer].   Per test claim: PA required; PA submitted to above mentioned insurance via Latent Key/confirmation #/EOC [id number] Status is pending

## 2024-05-29 NOTE — Telephone Encounter (Signed)
 Pharmacy Patient Advocate Encounter  Received notification from [employer] that Prior Authorization for Oxycodone  HCL 10 tabs has been APPROVED from 05/29/24 to 11/25/24. Ran test claim, Copay is $4.00. This test claim was processed through [facility]- copay amounts may vary at other pharmacies due to pharmacy/plan contracts, or as the patient moves through the different stages of their insurance plan.   PA #/Case ID/Reference #: # [id number]

## 2024-05-30 ENCOUNTER — Ambulatory Visit: Admitting: Family Medicine

## 2024-06-04 ENCOUNTER — Telehealth: Payer: Self-pay

## 2024-06-04 NOTE — Telephone Encounter (Signed)
 Copied from CRM #[id number]. Topic: Referral - Question >> Jun 04, 2024  1:54 PM Shanda MATSU wrote: Reason for CRM: Patient calling in for information such as contact phone # for psin management providers that PCP referred him to, provided patient with contact info for Dr. Donnice Binet phone #[phone] and Dr. Darletta Bathe phone #[phone]. >> Jun 04, 2024  2:04 PM Winona R wrote: Pt calling with FYI: [place] [facility] is full, [place] [facility] first avail is February. Pt states he may be calling back for pain meds

## 2024-06-06 ENCOUNTER — Telehealth: Payer: Self-pay | Admitting: Family Medicine

## 2024-06-06 DIAGNOSIS — G8929 Other chronic pain: Secondary | ICD-10-CM

## 2024-06-06 DIAGNOSIS — Z515 Encounter for palliative care: Secondary | ICD-10-CM

## 2024-06-06 MED ORDER — OXYCODONE HCL 10 MG PO TABS: 10.0000 mg | ORAL_TABLET | ORAL | 0 refills | Status: DC | PRN

## 2024-06-06 NOTE — Telephone Encounter (Signed)
 Copied from CRM #[id number]. Topic: Clinical - Medication Refill >> Jun 06, 2024  2:28 PM Dedra B wrote: Medication:  Oxycodone  HCl 10 MG TABS   Has the patient contacted their pharmacy? Yes  This is the patient's preferred pharmacy:  [id number] [place], [place] - [address] [place] [address] Phone: [phone] Fax: [phone]  Is this the correct pharmacy for this prescription? Yes  Has the prescription been filled recently? Yes, 05/28/25 but pt gets 10 day supply  Is the patient out of the medication? No, almost  Has the patient been seen for an appointment in the last year OR does the patient have an upcoming appointment? Yes  Can we respond through MyChart? Yes  Agent: Please be advised that Rx refills may take up to 3 business days. We ask that you follow-up with your pharmacy.

## 2024-06-06 NOTE — Telephone Encounter (Signed)
 Please notify him that Pain medicine Refill sent to pharmacy for pick up 06/07/24  [name], DO [facility] 06/06/2024, 6:46 PM

## 2024-06-14 ENCOUNTER — Other Ambulatory Visit: Payer: Self-pay | Admitting: Family Medicine

## 2024-06-14 DIAGNOSIS — G8929 Other chronic pain: Secondary | ICD-10-CM

## 2024-06-14 DIAGNOSIS — M545 Low back pain, unspecified: Secondary | ICD-10-CM

## 2024-06-14 DIAGNOSIS — Z515 Encounter for palliative care: Secondary | ICD-10-CM

## 2024-06-14 MED ORDER — OXYCODONE HCL 10 MG PO TABS: 10.0000 mg | ORAL_TABLET | ORAL | 0 refills | Status: DC | PRN

## 2024-06-24 ENCOUNTER — Other Ambulatory Visit: Payer: Self-pay | Admitting: Family Medicine

## 2024-06-24 DIAGNOSIS — M545 Low back pain, unspecified: Secondary | ICD-10-CM

## 2024-06-24 DIAGNOSIS — G8929 Other chronic pain: Secondary | ICD-10-CM

## 2024-06-24 DIAGNOSIS — Z515 Encounter for palliative care: Secondary | ICD-10-CM

## 2024-06-24 NOTE — Telephone Encounter (Signed)
 Copied from CRM #[id number]. Topic: Clinical - Medication Refill >> Jun 24, 2024 10:50 AM Zebedee SAUNDERS wrote: Medication: Oxycodone  HCl 10 MG TABS  Has the patient contacted their pharmacy? Yes (Agent: If no, request that the patient contact the pharmacy for the refill. If patient does not wish to contact the pharmacy document the reason why and proceed with request.) (Agent: If yes, when and what did the pharmacy advise?)  This is the patient's preferred pharmacy:  [id number] [place], [place] - [address] [place] [address] Phone: [phone] Fax: [phone]  Is this the correct pharmacy for this prescription? Yes If no, delete pharmacy and type the correct one.   Has the prescription been filled recently? Yes  Is the patient out of the medication? Yes  Has the patient been seen for an appointment in the last year OR does the patient have an upcoming appointment? Yes  Can we respond through MyChart? Yes  Agent: Please be advised that Rx refills may take up to 3 business days. We ask that you follow-up with your pharmacy.

## 2024-06-25 ENCOUNTER — Ambulatory Visit: Payer: Self-pay | Admitting: *Deleted

## 2024-06-25 ENCOUNTER — Telehealth: Payer: Self-pay | Admitting: Family Medicine

## 2024-06-25 MED ORDER — OXYCODONE HCL 10 MG PO TABS: 10.0000 mg | ORAL_TABLET | ORAL | 0 refills | Status: DC | PRN

## 2024-06-25 NOTE — Telephone Encounter (Signed)
 Spoke with patient, he will check with pharmacy about pick up

## 2024-06-25 NOTE — Telephone Encounter (Signed)
 FYI Only or Action Required?: Action required by provider: medication refill request, update on patient condition, and earliest appt scheduled for 06/27/24 swelling redness right foot.  Patient was last seen in primary care on 05/09/2024 by Tom Marsa PARAS, DO.  Called Nurse Triage reporting Pain.  Symptoms began several days ago. 5 days ago   Interventions attempted: Rest, hydration, or home remedies.  Symptoms are: rapidly worsening.  Triage Disposition: See HCP Within 4 Hours (Or PCP Triage) 4- 48 hours  Patient/caregiver understands and will follow disposition?: Yes   Patient requesting refill oxycodone  has been out x 5 days. Unsure if someone took any of his medications. Reinforced to patient need to guard narcotics in safe place away from others at all times. Patient not sleeping .           Copied from CRM [id number]. Topic: Clinical - Red Word Triage >> Jun 25, 2024 11:42 AM Tom [name] wrote: Red Word that prompted transfer to Nurse Triage: Patient is in pain and has been out of Oxycodone  HCl 10 MG TABS for 5 days. His pain level is a 10. He also stated he wishes they would cut his foot off.  [id number] [place], [place] - [address] [place] [address] Phone: [phone] Fax: [phone] Hours: Not open 24 hours Reason for Disposition  [1] SEVERE pain (e.g., excruciating, unable to do any normal activities) AND [2] not improved after 2 hours of pain medicine  Answer Assessment - Initial Assessment Questions Earliest appt scheduled for 06/27/24. Swelling, redness in left foot and severe pain. Patient out of medication x 5 days. Requesting refill . Patient reports he is awaiting appt for pain clinic and will not be seen until Feb. Does not want to bother PCP but is in severe pain. Unable to take tylenol  due to hx cirrhosis. Recommended if sx worsen go to ED. Patient reports EMS would not take him to ED .         1. ONSET: When  did the pain start?      5 days ago out of pain medication  2. LOCATION: Where is the pain located?      Right foot 3. PAIN: How bad is the pain?    (Scale 1-10; or mild, moderate, severe)     severe 4. WORK OR EXERCISE: Has there been any recent work or exercise that involved this part of the body?      Out of pain medication x 5 days 5. CAUSE: What do you think is causing the foot pain?     Out of pain medication x 5 days  6. OTHER SYMPTOMS: Do you have any other symptoms? (e.g., leg pain, rash, fever, numbness)     Right foot pain, swelling redness. Foot turns to the left. Uses scooter to ambulate.  7. PREGNANCY: Is there any chance you are pregnant? When was your last menstrual period?     na  Protocols used: Foot Pain-A-AH

## 2024-06-25 NOTE — Telephone Encounter (Signed)
Pt called to report that he is completely out of his current supply 

## 2024-06-25 NOTE — Telephone Encounter (Signed)
 See previous message

## 2024-06-25 NOTE — Telephone Encounter (Signed)
 Copied from CRM [id number]. Topic: Clinical - Prescription Issue >> Jun 25, 2024  1:01 PM Tom [name] wrote: Reason for CRM: Patient is calling to check the refill status of his Oxycodone  HCl 10 mg tablets. The chart shows the order was placed today[facility] not [facility].

## 2024-06-27 ENCOUNTER — Encounter: Payer: Self-pay | Admitting: Family Medicine

## 2024-06-27 ENCOUNTER — Ambulatory Visit: Admitting: Family Medicine

## 2024-06-27 VITALS — BP 124/74 | Ht 72.0 in | Wt 225.0 lb

## 2024-06-27 DIAGNOSIS — R7303 Prediabetes: Secondary | ICD-10-CM

## 2024-06-27 DIAGNOSIS — R770 Abnormality of albumin: Secondary | ICD-10-CM | POA: Diagnosis not present

## 2024-06-27 DIAGNOSIS — D649 Anemia, unspecified: Secondary | ICD-10-CM | POA: Diagnosis not present

## 2024-06-27 DIAGNOSIS — K7682 Hepatic encephalopathy: Secondary | ICD-10-CM

## 2024-06-27 DIAGNOSIS — G8929 Other chronic pain: Secondary | ICD-10-CM

## 2024-06-27 DIAGNOSIS — M21961 Unspecified acquired deformity of right lower leg: Secondary | ICD-10-CM

## 2024-06-27 DIAGNOSIS — E559 Vitamin D deficiency, unspecified: Secondary | ICD-10-CM

## 2024-06-27 DIAGNOSIS — L209 Atopic dermatitis, unspecified: Secondary | ICD-10-CM | POA: Diagnosis not present

## 2024-06-27 DIAGNOSIS — M545 Low back pain, unspecified: Secondary | ICD-10-CM | POA: Diagnosis not present

## 2024-06-27 DIAGNOSIS — K7031 Alcoholic cirrhosis of liver with ascites: Secondary | ICD-10-CM | POA: Diagnosis not present

## 2024-06-27 DIAGNOSIS — M79671 Pain in right foot: Secondary | ICD-10-CM

## 2024-06-27 DIAGNOSIS — E538 Deficiency of other specified B group vitamins: Secondary | ICD-10-CM

## 2024-06-27 MED ORDER — LIDOCAINE 5 % EX PTCH: 1.0000 | MEDICATED_PATCH | CUTANEOUS | 3 refills | Status: AC

## 2024-06-27 MED ORDER — OXYCODONE HCL 10 MG PO TABS: 10.0000 mg | ORAL_TABLET | ORAL | 0 refills | Status: DC | PRN

## 2024-06-27 NOTE — Progress Notes (Signed)
 "  Subjective:    Patient ID: Tom [name], male    DOB: [birth year], 59 y.o.   MRN: [id number]  Tom [name] is a 59 y.o. male presenting on 06/27/2024 for Medical Management of Chronic Issues   HPI  Discussed the use of AI scribe software for clinical note transcription with the patient, who gave verbal consent to proceed.  History of Present Illness   Tom [name] is a 59 year old male with cirrhosis of the liver who presents for evaluation of nutritional status and management of dry, flaky skin. He is accompanied by his wife, who is involved in his care and dietary management.  Atopic Dermatitis Cutaneous symptoms - Dry, flaky skin, particularly around the ear and face - Uses moisturizer and over-the-counter hydrocortisone cream for symptom management - Concern for possible association with cirrhosis or psoriatic arthritis  Poor Nutritional status Low Albumin  - Concern regarding nutritional status, especially in relation to wound healing and overall health - Recalls previous orthopedic consultation indicating low nutritional levels would impact his healing. - Interested in nutritional panel to assess current status - Family manages diet with focus on high protein intake to support liver health  Chronic Pain and muscle spasms - Uses Lidoderm  patches and oxycodone  for pain management with good effect - Takes baclofen , two tablets daily, and is considering increasing the dose for muscle spasms  Mobility impairment - Difficulty with mobility due to foot condition - Relies on a scooter for movement but continues to attempt walking despite pain  History of osteomyelitis and fungal infection - History of osteomyelitis in the big toe - History of yeast infection in the ankle bone, identified during transition to new liver specialist      05/09/2024    6:07 PM 05/16/2023    5:40 PM 02/22/2023    2:40 PM  Depression screen PHQ 2/9  Decreased Interest 0 0  0  Down, Depressed, Hopeless 0 0 0  PHQ - 2 Score 0 0 0  Altered sleeping 0 0   Tired, decreased energy 0 0   Change in appetite 0 0   Feeling bad or failure about yourself  0 0   Trouble concentrating 0 0   Moving slowly or fidgety/restless 0 0   Suicidal thoughts 0 0   PHQ-9 Score 0 0    Difficult doing work/chores Not difficult at all Not difficult at all      Data saved with a previous flowsheet row definition       09/21/2022   11:26 AM 12/16/2020    3:16 PM  GAD 7 : Generalized Anxiety Score  Nervous, Anxious, on Edge 0 0  Control/stop worrying 0 0  Worry too much - different things 0 0  Trouble relaxing 0 0  Restless 0 0  Easily annoyed or irritable 0 0  Afraid - awful might happen 0 0  Total GAD 7 Score 0 0  Anxiety Difficulty Not difficult at all Not difficult at all    Social History[1]  Review of Systems Per HPI unless specifically indicated above     Objective:    BP 124/74 (BP Location: Right Arm, Patient Position: Sitting, Cuff Size: Normal)   Ht 6' (1.829 m)   Wt 225 lb (102.1 kg)   BMI 30.52 kg/m   Wt Readings from Last 3 Encounters:  06/27/24 225 lb (102.1 kg)  05/09/24 213 lb (96.6 kg)  04/29/24 213 lb 8 oz (96.8 kg)    Physical Exam  Vitals and nursing note reviewed.  Constitutional:      General: He is not in acute distress.    Appearance: Normal appearance. He is well-developed. He is not diaphoretic.     Comments: Well-appearing, uncomfortable, cooperative  HENT:     Head: Normocephalic and atraumatic.  Eyes:     General:        Right eye: No discharge.        Left eye: No discharge.     Conjunctiva/sclera: Conjunctivae normal.  Cardiovascular:     Rate and Rhythm: Normal rate.  Pulmonary:     Effort: Pulmonary effort is normal.  Musculoskeletal:     Right lower leg: Edema (R foot ankle significant edema deformity) present.     Comments: Knee scooter for mobility  Limited weight bear on R lower extremity. Deformity  Skin:     General: Skin is warm and dry.     Findings: No erythema or rash.  Neurological:     Mental Status: He is alert and oriented to person, place, and time.  Psychiatric:        Mood and Affect: Mood normal.        Behavior: Behavior normal.        Thought Content: Thought content normal.     Comments: Well groomed, good eye contact, normal speech and thoughts     Results for orders placed or performed during the hospital encounter of 03/30/24  Comprehensive metabolic panel   Collection Time: 03/30/24  7:24 AM  Result Value Ref Range   Sodium 136 135 - 145 mmol/L   Potassium 4.0 3.5 - 5.1 mmol/L   Chloride 105 98 - 111 mmol/L   CO2 19 (L) 22 - 32 mmol/L   Glucose, Bld 98 70 - 99 mg/dL   BUN 17 6 - 20 mg/dL   Creatinine, Ser 9.02 0.61 - 1.24 mg/dL   Calcium  9.2 8.9 - 10.3 mg/dL   Total Protein 6.8 6.5 - 8.1 g/dL   Albumin  3.1 (L) 3.5 - 5.0 g/dL   AST 28 15 - 41 U/L   ALT 19 0 - 44 U/L   Alkaline Phosphatase 92 38 - 126 U/L   Total Bilirubin 1.2 0.0 - 1.2 mg/dL   GFR, Estimated >39 >39 mL/min   Anion gap 12 5 - 15  CBC with Differential   Collection Time: 03/30/24  7:24 AM  Result Value Ref Range   WBC 7.5 4.0 - 10.5 K/uL   RBC 4.59 4.22 - 5.81 MIL/uL   Hemoglobin 12.7 (L) 13.0 - 17.0 g/dL   HCT 59.8 60.9 - 47.9 %   MCV 87.4 80.0 - 100.0 fL   MCH 27.7 26.0 - 34.0 pg   MCHC 31.7 30.0 - 36.0 g/dL   RDW 83.8 (H) 88.4 - 84.4 %   Platelets 273 150 - 400 K/uL   nRBC 0.0 0.0 - 0.2 %   Neutrophils Relative % 45 %   Neutro Abs 3.4 1.7 - 7.7 K/uL   Lymphocytes Relative 31 %   Lymphs Abs 2.3 0.7 - 4.0 K/uL   Monocytes Relative 15 %   Monocytes Absolute 1.1 (H) 0.1 - 1.0 K/uL   Eosinophils Relative 7 %   Eosinophils Absolute 0.5 0.0 - 0.5 K/uL   Basophils Relative 2 %   Basophils Absolute 0.1 0.0 - 0.1 K/uL   Immature Granulocytes 0 %   Abs Immature Granulocytes 0.02 0.00 - 0.07 K/uL  Lactic acid, plasma   Collection Time: 03/30/24  7:24 AM  Result Value Ref Range   Lactic  Acid, Venous 0.9 0.5 - 1.9 mmol/L      Assessment & Plan:   Problem List Items Addressed This Visit     Alcoholic cirrhosis of liver with ascites (HCC) - Primary   Relevant Orders   TSH   CBC with Differential/Platelet   Comprehensive metabolic panel with GFR   Iron, TIBC and Ferritin Panel   Ammonia   Protime-INR   Chronic back pain   Relevant Medications   lidocaine  (LIDODERM ) 5 %   Oxycodone  HCl 10 MG TABS (Start on 07/01/2024)   Hepatic encephalopathy (HCC)   Relevant Orders   Ammonia   Normocytic anemia   Relevant Orders   CBC with Differential/Platelet   Iron, TIBC and Ferritin Panel   Pre-diabetes   Relevant Orders   Hemoglobin A1c   Other Visit Diagnoses       Atopic dermatitis, unspecified type         Chronic foot pain, right       Relevant Medications   Oxycodone  HCl 10 MG TABS (Start on 07/01/2024)     Foot deformity, acquired, right         Vitamin B12 deficiency       Relevant Orders   Vitamin B12     Vitamin D  deficiency       Relevant Orders   VITAMIN D  25 Hydroxy (Vit-D Deficiency, Fractures)     Low serum albumin            Alcoholic cirrhosis of liver with ascites Chronic condition with low albumin  levels indicating impaired liver function. Nutritional status is a concern due to potential poor wound healing. No recent liver consultation in over six months. Previous endoscopy appointment not completed. - Ordered comprehensive blood panel including liver function tests, electrolytes, albumin , and nutritional status. - Referred to [facility] Hepatology for liver consultation. - Provided contact information for [facility] liver doctor. - Discussed potential referral to a nutritionist via [employer]. - I am concerned that his liver parameters / nutritional status will be difficult to resolve to allow him to get surgery for the amputation. We will need to collaborate with his Hepatologist further and discuss options to surgical clearance in future.  Chronic right foot  pain and acquired foot deformity Chronic problem complicated with ankle fracture repair, ortho hardware and failure ultimately lead to revision procedures and dysfunction of foot/ankle Chronic pain and deformity in the right foot, exacerbated by walking. Previous orthopedic consultation indicated poor nutritional status as a barrier to surgical intervention. Lidoderm  patches have been effective in managing pain. Baclofen  is used for muscle relaxation but does not fully address pain. - I am continuing to prescribe his Opioid therapy at lower dose now managing dose reduction and maintenance until can transfer to pain clinic for further management. - Continue Lidoderm  patches for pain management. - Increased baclofen  10mg  dosage to three times per day. - Referred to pain management clinic in [place] (Dr Fernand [facility]) for further evaluation. Awaiting scheduling, anticipated February - Provided contact information for pain management clinic.  Atopic dermatitis Dry, flaky, and irritated skin consistent with atopic dermatitis. - Recommended over-the-counter hydrocortisone cream for facial dermatitis. Can other other topical steroid for body if indicated  Vitamin B12 deficiency - Included vitamin B12 level in comprehensive blood panel.  Vitamin D  deficiency - Included vitamin D  level in comprehensive blood panel.        Next Oxycodone  renewal Monday 07/08/24  Orders Placed This Encounter  Procedures   TSH   Hemoglobin A1c   CBC with Differential/Platelet   Comprehensive metabolic panel with GFR    Has the patient fasted?:   Yes   VITAMIN D  25 Hydroxy (Vit-D Deficiency, Fractures)   Vitamin B12   Iron, TIBC and Ferritin Panel   Ammonia   Protime-INR    Meds ordered this encounter  Medications   lidocaine  (LIDODERM ) 5 %    Sig: Place 1 patch onto the skin daily. Remove & Discard patch within 12 hours or as directed by MD    Dispense:  30 patch    Refill:  3    Oxycodone  HCl 10 MG TABS    Sig: Take 1 tablet (10 mg total) by mouth every 4 (four) hours as needed (pain).    Dispense:  60 tablet    Refill:  0    First fill 07/01/2024    Follow up plan: Return in about 4 weeks (around 07/25/2024) for 4 weeks Pain Management / Cirrhosis updates.   [name], DO [facility] 06/27/2024, 9:25 AM     [1]  Social History Tobacco Use   Smoking status: Former    Current packs/day: 0.00    Average packs/day: 1 pack/day for 39.0 years (39.0 ttl pk-yrs)    Types: Cigarettes    Start date: 12/1976    Quit date: 12/2015    Years since quitting: 8.5   Smokeless tobacco: Never  Vaping Use   Vaping status: Never Used  Substance Use Topics   Alcohol use: No   Drug use: Yes    Types: Marijuana   "

## 2024-06-27 NOTE — Patient Instructions (Addendum)
 Thank you for coming to the office today.  Please call your Cirrhosis Liver Specialist at [facility] at [facility] (Gastroenterology) Address: [address], [place], [place] [address] Phone: [phone]  Please call Dr Kathern office in February to check status. They should have your referral. If they need us  to re-send the referral, please let us  know.  Dr Darletta Bathe [facility] [address], [address] [place], [place] [address] Ph: [phone]  ----------------------------------  Recommend using Hydrocortisone Cream for face areas with dry skin patches on face / nose and behind ear  The rash looks most consistent with eczema, this can flare up and get worse due to a variety of factors (excessive dry skin from bathing/showering, soaps, cold weather / indoor heaters, outdoor exposures).  Use the topical steroid creams twice a day for up to 1 week, maximum duration of use per one flare is 10 to 14 days, then STOP using it and allow skin to recover. Caution with over-use may cause lightening of the skin.   Tips to reduce Eczema Flares: For baths/showers, limit bathing to every other day if you can (max 1 x daily)  Use a gentle, unscented soap and lukewarm water (hot water is most irritating to skin) Never scrub skin with too much pressure, this causes more irritation. Pat skin dry, then leave it slightly damp. DO NOT scrub it dry. Apply steroid cream to skin and rub in all the way, wait 15 min, then apply a daily moisturizer (Vaseline, Eucerin, Aveeno). Continue daily moisturizer every day of the year (even after flare is resolved) - If you have eczema on hands or dry hands, recommend wearing any type of gloves overnight (cloth, fabric, or even nitrile/latex) to improve effect of topical moisturizer  If develops redness, honey colored crust oozing, drainage of pus, bleeding, or redness / swelling, pain, please return for re-evaluation, may  have become infected after scratching.  - Pain medicine refill by Monday 1/19 th  Dose increase Baclofen  up to 3 times per day, is okay.  Lab panel today including nutritional status  [email or web address]  Check into telehealth nutritionist  Please schedule a Follow-up Appointment to: Return in about 4 weeks (around 07/25/2024) for 4 weeks Pain Management / Cirrhosis updates.  If you have any other questions or concerns, please feel free to call the office or send a message through MyChart. You may also schedule an earlier appointment if necessary.  Additionally, you may be receiving a survey about your experience at our office within a few days to 1 week by e-mail or mail. We value your feedback.  [name], DO [facility], [place]

## 2024-06-28 LAB — VITAMIN B12: Vitamin B-12: 579 pg/mL (ref 200–1100)

## 2024-06-28 LAB — COMPREHENSIVE METABOLIC PANEL WITH GFR
AG Ratio: 1.3 (calc) (ref 1.0–2.5)
ALT: 16 U/L (ref 9–46)
AST: 24 U/L (ref 10–35)
Albumin: 3.7 g/dL (ref 3.6–5.1)
Alkaline phosphatase (APISO): 123 U/L (ref 35–144)
BUN: 23 mg/dL (ref 7–25)
CO2: 23 mmol/L (ref 20–32)
Calcium: 9.1 mg/dL (ref 8.6–10.3)
Chloride: 102 mmol/L (ref 98–110)
Creat: 1.11 mg/dL (ref 0.70–1.35)
Globulin: 2.8 g/dL (ref 1.9–3.7)
Glucose, Bld: 96 mg/dL (ref 65–99)
Potassium: 4.5 mmol/L (ref 3.5–5.3)
Sodium: 134 mmol/L — ABNORMAL LOW (ref 135–146)
Total Bilirubin: 1.8 mg/dL — ABNORMAL HIGH (ref 0.2–1.2)
Total Protein: 6.5 g/dL (ref 6.1–8.1)
eGFR: 76 mL/min/1.73m2

## 2024-06-28 LAB — AMMONIA: Ammonia: 89 umol/L — ABNORMAL HIGH

## 2024-06-28 LAB — PROTIME-INR
INR: 1.3 — ABNORMAL HIGH
Prothrombin Time: 13.2 s — ABNORMAL HIGH (ref 9.0–11.5)

## 2024-06-28 LAB — CBC WITH DIFFERENTIAL/PLATELET
Absolute Lymphocytes: 2447 {cells}/uL (ref 850–3900)
Absolute Monocytes: 1239 {cells}/uL — ABNORMAL HIGH (ref 200–950)
Basophils Absolute: 122 {cells}/uL (ref 0–200)
Basophils Relative: 1.6 %
Eosinophils Absolute: 433 {cells}/uL (ref 15–500)
Eosinophils Relative: 5.7 %
HCT: 38.1 % — ABNORMAL LOW (ref 39.4–51.1)
Hemoglobin: 12.5 g/dL — ABNORMAL LOW (ref 13.2–17.1)
MCH: 28.2 pg (ref 27.0–33.0)
MCHC: 32.8 g/dL (ref 31.6–35.4)
MCV: 85.8 fL (ref 81.4–101.7)
MPV: 10.9 fL (ref 7.5–12.5)
Monocytes Relative: 16.3 %
Neutro Abs: 3359 {cells}/uL (ref 1500–7800)
Neutrophils Relative %: 44.2 %
Platelets: 225 Thousand/uL (ref 140–400)
RBC: 4.44 Million/uL (ref 4.20–5.80)
RDW: 14.1 % (ref 11.0–15.0)
Total Lymphocyte: 32.2 %
WBC: 7.6 Thousand/uL (ref 3.8–10.8)

## 2024-06-28 LAB — IRON,TIBC AND FERRITIN PANEL
%SAT: 19 % — ABNORMAL LOW (ref 20–48)
Ferritin: 38 ng/mL (ref 24–380)
Iron: 60 ug/dL (ref 50–180)
TIBC: 324 ug/dL (ref 250–425)

## 2024-06-28 LAB — TSH: TSH: 3.65 m[IU]/L (ref 0.40–4.50)

## 2024-06-28 LAB — HEMOGLOBIN A1C
Hgb A1c MFr Bld: 5.5 %
Mean Plasma Glucose: 111 mg/dL
eAG (mmol/L): 6.2 mmol/L

## 2024-06-28 LAB — VITAMIN D 25 HYDROXY (VIT D DEFICIENCY, FRACTURES): Vit D, 25-Hydroxy: 33 ng/mL (ref 30–100)

## 2024-07-02 ENCOUNTER — Ambulatory Visit: Admitting: Family Medicine

## 2024-07-02 ENCOUNTER — Ambulatory Visit: Payer: Self-pay | Admitting: Family Medicine

## 2024-07-02 ENCOUNTER — Telehealth: Payer: Self-pay

## 2024-07-02 NOTE — Telephone Encounter (Signed)
 Copied from CRM #[id number]. Topic: Clinical - Lab/Test Results >> Jul 02, 2024  4:44 PM Joesph NOVAK wrote: Reason for CRM: patient calling for lab results

## 2024-07-02 NOTE — Telephone Encounter (Signed)
 Please call patient to share lab results:  Overall results are stable without any major concerns that require new treatment. There are results that show some improvement in his overall status.  His albumin  is up to 3.7 that is improved from 1 year ago. This shows improved nutritional status.  Blood count has stable Hemoglobin now at 12.5, which is an improvement over past 1 year with prior anemia. His iron studies are mostly normal and show improvement compared to 1 year ago, less iron deficiency.  Ammonia level is 89, this is elevated. But similar to prior 1 year ago. If he is not having confusion or symptoms of high ammonia, no further changes needed. He should continue Lactulose  to help reduce ammonia level.  INR 1.3, stable mild elevated similar to 1+ years ago. This is elevated due to liver cirrhosis.  Chemistry shows normal kidney function. Mild low sodium at 134. This can be due to liver and medications. Not a concern it is very mildly low.  Liver enzymes are in normal range. Bilirubin mild elevated but this is stable similar to previous readings. Also can be related to liver.  A1c 5.5, normal no pre-diabetes. Thyroid , Vitamin D  and Vitamin B12 are normal.  [name], DO [facility] 07/02/2024, 5:52 PM

## 2024-07-03 ENCOUNTER — Other Ambulatory Visit: Payer: Self-pay | Admitting: Family Medicine

## 2024-07-03 ENCOUNTER — Telehealth: Payer: Self-pay

## 2024-07-03 DIAGNOSIS — G8929 Other chronic pain: Secondary | ICD-10-CM

## 2024-07-03 DIAGNOSIS — K7031 Alcoholic cirrhosis of liver with ascites: Secondary | ICD-10-CM

## 2024-07-03 NOTE — Telephone Encounter (Signed)
 Copied from CRM [id number]. Topic: Clinical - Lab/Test Results >> Jul 03, 2024  2:00 PM Delon DASEN wrote: Reason for CRM: read results verbatim- has question about nutrition level, asking about foot amputation

## 2024-07-03 NOTE — Telephone Encounter (Signed)
 Tried calling patient no answer or VM    Ok to advise if patient returns call

## 2024-07-03 NOTE — Telephone Encounter (Signed)
 Copied from CRM [id number]. Topic: Clinical - Medication Refill >> Jul 03, 2024  2:04 PM Tom [name] wrote: Medication: Oxycodone  HCl 10 MG TABS  Has the patient contacted their pharmacy? No (Agent: If no, request that the patient contact the pharmacy for the refill. If patient does not wish to contact the pharmacy document the reason why and proceed with request.) (Agent: If yes, when and what did the pharmacy advise?)  This is the patient's preferred pharmacy:  [id number] - [place], [place] - [address] [place] [address] Phone: [phone] Fax: [phone]  He is calling early due to fill date is Saturday  Is this the correct pharmacy for this prescription? Yes If no, delete pharmacy and type the correct one.   Has the prescription been filled recently? Yes  Is the patient out of the medication? Yes  Has the patient been seen for an appointment in the last year OR does the patient have an upcoming appointment? Yes  Can we respond through MyChart? No  Agent: Please be advised that Rx refills may take up to 3 business days. We ask that you follow-up with your pharmacy.

## 2024-07-03 NOTE — Telephone Encounter (Signed)
 Spoke to patient, he will call his foot doctor and have them review his lab results. He was questioning when can he have his foot cut off and a level needs to be a 10. I asked for him to explain which level needed to be a 10 and he was unsure.

## 2024-07-04 NOTE — Addendum Note (Signed)
 Addended by: EDMAN MARSA PARAS on: 07/04/2024 05:04 PM   Modules accepted: Orders

## 2024-07-04 NOTE — Telephone Encounter (Signed)
 I called him back.   Oxycodone  refill is available for pick up on Friday 1/16 I confirmed with the pharmacy.  I advised that he needs to discuss MELD score with his Hepatologist at [facility]. It has been about 1-2 years since last visit with [facility] Hep[facility].   With recent labs. His MELD Score is calculated at 14. Lower is better. It sounds like they want him at a 10.  Male, age >17  Creatinine 1.11 mg/dL   Total bilirubin 1.8 mg/dL   INR 1.3  Sodium 865 mEq/L   Albumin  3.7 g/dL   [name], DO [facility] 07/04/2024, 4:46 PM

## 2024-07-04 NOTE — Telephone Encounter (Signed)
 Requested medication (s) are due for refill today: No  Requested medication (s) are on the active medication list: Yes  Last refill:  07/01/24  Future visit scheduled: Yes  Notes to clinic:  Not delegated.    Requested Prescriptions  Pending Prescriptions Disp Refills   Oxycodone  HCl 10 MG TABS 60 tablet 0    Sig: Take 1 tablet (10 mg total) by mouth every 4 (four) hours as needed (pain).     Not Delegated - Analgesics:  Opioid Agonists Failed - 07/04/2024  3:23 PM      Failed - This refill cannot be delegated      Failed - Urine Drug Screen completed in last 360 days      Passed - Valid encounter within last 3 months    Recent Outpatient Visits           1 week ago Alcoholic cirrhosis of liver with ascites [facility])   [place] [facility], Marsa PARAS, DO   1 month ago Alcoholic cirrhosis of liver with ascites [facility])   [facility] Marsa PARAS, DO   2 months ago Right inguinal hernia   [place] [facility] [place], Marsa PARAS, DO   3 months ago Chronic foot pain, right   [place] [facility] [place], Marsa PARAS, DO   5 months ago Chronic foot pain, right    [facility] [place], Marsa PARAS, [place]

## 2024-07-10 ENCOUNTER — Telehealth: Payer: Self-pay | Admitting: Family Medicine

## 2024-07-10 DIAGNOSIS — G8929 Other chronic pain: Secondary | ICD-10-CM

## 2024-07-10 MED ORDER — OXYCODONE HCL 10 MG PO TABS: 10.0000 mg | ORAL_TABLET | ORAL | 0 refills | Status: DC | PRN

## 2024-07-10 NOTE — Telephone Encounter (Signed)
 Left message for patient to return call OK to advise

## 2024-07-10 NOTE — Telephone Encounter (Signed)
 Please call patient to notify him that his next Oxycodone  pain med is due for refill on Monday 1/26  However, due to weather, I did go ahead and send it to pharmacy for fill on Friday 1/23. I am not sure if the pharmacy will allow or cover the refill, but he should ask the pharmacy if he can pick it up on Friday or Saturday if possible.  [name], DO [facility] 07/10/2024, 12:43 PM

## 2024-07-11 ENCOUNTER — Other Ambulatory Visit: Payer: Self-pay | Admitting: Family Medicine

## 2024-07-11 ENCOUNTER — Ambulatory Visit: Payer: Self-pay | Admitting: Family Medicine

## 2024-07-11 DIAGNOSIS — G2581 Restless legs syndrome: Secondary | ICD-10-CM

## 2024-07-11 DIAGNOSIS — G8929 Other chronic pain: Secondary | ICD-10-CM

## 2024-07-11 NOTE — Telephone Encounter (Signed)
 Patient notified that prescription was sent in yesterday for pick up on 1/23

## 2024-07-11 NOTE — Telephone Encounter (Signed)
 FYI Only or Action Required?: Action required by provider: medication refill request. Pt is requesting early refill for both pain medication and muscled relaxer.  Pt would like a call back regarding refills.   Patient was last seen in primary care on 06/27/2024 by Edman Marsa PARAS, DO.  Called Nurse Triage reporting Foot Pain.  Symptoms began ongoing.  Interventions attempted: Other: Has taken extra medication for increased pain.  Symptoms are: gradually worsening.  Triage Disposition: See PCP When Office is Open (Within 3 Days)  Patient/caregiver understands and will follow disposition?: Unsure                         Reason for Triage: patient is scheduled for surgery on 07/19/24- a right foot will be  amputated- have one or two tablets left for Oxycodone  HCl 10 MG TABS (Starting on 07/12/2024)- extreme pain for a level 10   Asked for pain medication refill for Oxycodone  HCl 10 MG TABS (Starting on 07/12/2024)   Reason for Disposition  [1] MODERATE pain (e.g., interferes with normal activities, limping) AND [2] present > 3 days  Answer Assessment - Initial Assessment Questions 1. ONSET: When did the pain start?      ongoing 2. LOCATION: Where is the pain located?      Right foot 3. PAIN: How bad is the pain?    (Scale 1-10; or mild, moderate, severe)     10/10 4. WORK OR EXERCISE: Has there been any recent work or exercise that involved this part of the body?      no 5. CAUSE: What do you think is causing the foot pain?      6. OTHER SYMPTOMS: Do you have any other symptoms? (e.g., leg pain, rash, fever, numbness)     no  Protocols used: Foot Pain-A-AH

## 2024-07-12 NOTE — Telephone Encounter (Signed)
 Requested Prescriptions  Refused Prescriptions Disp Refills   baclofen  (LIORESAL ) 10 MG tablet 90 each 2    Sig: Take 0.5-1 tablets (5-10 mg total) by mouth 3 (three) times daily as needed for muscle spasms.     Analgesics:  Muscle Relaxants - baclofen  Passed - 07/12/2024 10:50 AM      Passed - Cr in normal range and within 180 days    Creat  Date Value Ref Range Status  06/27/2024 1.11 0.70 - 1.35 mg/dL Final         Passed - eGFR is 30 or above and within 180 days    GFR, Estimated  Date Value Ref Range Status  03/30/2024 >60 >60 mL/min Final    Comment:    (NOTE) Calculated using the CKD-EPI Creatinine Equation (2021)    eGFR  Date Value Ref Range Status  06/27/2024 76 > OR = 60 mL/min/1.81m2 Final         Passed - Valid encounter within last 6 months    Recent Outpatient Visits           2 weeks ago Alcoholic cirrhosis of liver with ascites [facility])   [place] [facility] [place], Marsa PARAS, DO   2 months ago Alcoholic cirrhosis of liver with ascites [facility])   [place] [facility] Marsa PARAS, DO   2 months ago Right inguinal hernia   [place] [facility] [place], Marsa PARAS, DO   4 months ago Chronic foot pain, right   [place] [place], Marsa PARAS, DO   5 months ago Chronic foot pain, right   [place] [facility] [place], Marsa PARAS, [place]

## 2024-07-16 ENCOUNTER — Telehealth: Payer: Self-pay

## 2024-07-16 NOTE — Telephone Encounter (Signed)
 Okay thank you for calling. Yes Pain specialists have training in anesthesia as part of their pain management training. It is a clinic that offers pain management.  Perhaps can call him back later this week to follow-up on it to confirm he was able to call and schedule and nothing further is needed?  Thank you! I will try to help send a reminder as well.  [name], DO [facility] 07/16/2024, 1:23 PM

## 2024-07-16 NOTE — Telephone Encounter (Signed)
 Could you call patient to check with him first on status of previous pain management referral?  Yes we referred to Dr Fernand in [place] back in November/December 2025.  He told our office that the next available apt with Dr Fernand was in February 2026.  Can you ask if he has that apt coming up? Or if he did not schedule - he should call them to schedule it ASAP.  Let us  know if he needs a new referral entered into the system. But they should have already received our referral.   Dr Darletta Fernand  [facility] [address] [place], [place] [address] Ph: [phone]

## 2024-07-16 NOTE — Telephone Encounter (Signed)
 Copied from CRM #[id number]. Topic: Referral - Question >> Jul 15, 2024  4:10 PM Myrick T wrote: Reason for CRM: patient called requesting a pain management provider in [place] due to transportation reason. Please advise

## 2024-07-16 NOTE — Telephone Encounter (Signed)
 Spoke with patient, he was confused about [facility]. I did explain to him that they are also pain care. I gave him the information provided so he can call and schedule an appointment.

## 2024-07-18 ENCOUNTER — Telehealth: Payer: Self-pay

## 2024-07-18 NOTE — Telephone Encounter (Signed)
 Copied from CRM #[id number]. Topic: Clinical - Medical Advice >> Jul 18, 2024  1:09 PM Gattis SQUIBB wrote: Reason for CRM: pt is going tomorrow to talk to [facility] ortho about amputation of his foot.

## 2024-07-23 ENCOUNTER — Telehealth: Payer: Self-pay

## 2024-07-23 DIAGNOSIS — G8929 Other chronic pain: Secondary | ICD-10-CM

## 2024-07-23 DIAGNOSIS — T8484XA Pain due to internal orthopedic prosthetic devices, implants and grafts, initial encounter: Secondary | ICD-10-CM

## 2024-07-23 DIAGNOSIS — M5136 Other intervertebral disc degeneration, lumbar region with discogenic back pain only: Secondary | ICD-10-CM

## 2024-07-23 DIAGNOSIS — M21961 Unspecified acquired deformity of right lower leg: Secondary | ICD-10-CM

## 2024-07-23 NOTE — Telephone Encounter (Signed)
 Copied from CRM [id number]. Topic: Medical Record Request - Records Request >> Jul 23, 2024  1:32 PM Deleta RAMAN wrote: Reason for CRM: Patient calling [facility] [place] [phone] is requesting patient medical records of mri's and etc/ please follow up with patient for concerns

## 2024-07-23 NOTE — Telephone Encounter (Signed)
 Nikki,  Can you assist with this referral? It sounds like they may need a new referral order or need copy of medical records and any imaging.   Last imaging is with [facility] 03/30/24 R Ankle.  I don't see any MRI. He is followed by Orthopedics at [facility] and they probably would be the ones to have completed it, but I don't see an MRI.  Thank you! We have been trying to get him in with Pain Management for a long time, so let me know if [facility] needs anything else or other imaging before they can see him.  [name], DO [facility] 07/23/2024, 2:32 PM

## 2024-07-23 NOTE — Addendum Note (Signed)
 Addended by: EDMAN MARSA PARAS on: 07/23/2024 05:25 PM   Modules accepted: Orders

## 2024-07-23 NOTE — Telephone Encounter (Signed)
 Sure. New referral placed for Pain Management to see Dr Darletta.  That would be good if you can get [name] records to his office so they can schedule. If he requires MRI I can place an order or his [facility] can order. I don't think he has had one recently or at all.  [name], DO [facility] 07/23/2024, 5:25 PM

## 2024-07-24 ENCOUNTER — Other Ambulatory Visit: Payer: Self-pay

## 2024-07-24 DIAGNOSIS — G8929 Other chronic pain: Secondary | ICD-10-CM

## 2024-07-24 DIAGNOSIS — M545 Low back pain, unspecified: Secondary | ICD-10-CM

## 2024-07-24 NOTE — Telephone Encounter (Signed)
 Copied from CRM #[id number]. Topic: Clinical - Medication Refill >> Jul 24, 2024  8:42 AM Tonda B wrote: Medication: Oxycodone  HCl 10 MG TABS  Has the patient contacted their pharmacy? Yes (Agent: If no, request that the patient contact the pharmacy for the refill. If patient does not wish to contact the pharmacy document the reason why and proceed with request.) (Agent: If yes, when and what did the pharmacy advise?)  This is the patient's preferred pharmacy:  [id number] - [place], [place] - [address] [place] [address] Phone: [phone] Fax: [phone]  Is this the correct pharmacy for this prescription? Yes If no, delete pharmacy and type the correct one.   Has the prescription been filled recently? Yes  Is the patient out of the medication? Yes  Has the patient been seen for an appointment in the last year OR does the patient have an upcoming appointment? No  Can we respond through MyChart? No  Agent: Please be advised that Rx refills may take up to 3 business days. We ask that you follow-up with your pharmacy.

## 2024-07-25 ENCOUNTER — Encounter: Payer: Self-pay | Admitting: Family Medicine

## 2024-07-25 ENCOUNTER — Ambulatory Visit: Admitting: Family Medicine

## 2024-07-25 VITALS — BP 122/78 | HR 83 | Ht 72.0 in | Wt 218.4 lb

## 2024-07-25 DIAGNOSIS — Z23 Encounter for immunization: Secondary | ICD-10-CM

## 2024-07-25 DIAGNOSIS — M5136 Other intervertebral disc degeneration, lumbar region with discogenic back pain only: Secondary | ICD-10-CM

## 2024-07-25 DIAGNOSIS — M21961 Unspecified acquired deformity of right lower leg: Secondary | ICD-10-CM

## 2024-07-25 DIAGNOSIS — K7031 Alcoholic cirrhosis of liver with ascites: Secondary | ICD-10-CM

## 2024-07-25 DIAGNOSIS — G8929 Other chronic pain: Secondary | ICD-10-CM

## 2024-07-25 MED ORDER — OXYCODONE HCL 10 MG PO TABS: 10.0000 mg | ORAL_TABLET | ORAL | 0 refills | Status: AC | PRN

## 2024-07-25 NOTE — Progress Notes (Signed)
 "  Subjective:    Patient ID: Tom [name], male    DOB: 16-Feb-1966, 59 y.o.   MRN: [id number]  Tom [name] is a 59 y.o. male presenting on 07/25/2024 for Cirrhosis and Pain   HPI  Discussed the use of AI scribe software for clinical note transcription with the patient, who gave verbal consent to proceed.  History of Present Illness   Tom [name] is a 59 year old male with cirrhosis who presents for pain management related to a foot condition.  Chronic foot pain - Severe ongoing pain in the foot R side from prior failed orthopedic hardware fracture/trauma In past orthopedic considered for amputation due to refractory symptoms however this was advised against at recent apt. Orthopedic evaluation raised concerns about poor wound healing and prolonged recovery if amputation is performed, secondary to cirrhosis - A brace is being considered as an alternative to improve mobility - he continues on opioid therapy at this time at reduced dosage in past several months. - Uses mobility scooter for knee for R knee to avoid weightbearing R foot/ankle  - Uses CBD cigarettes for pain relief, with partial improvement in symptoms - Expresses concern about CBD presence in system during potential drug testing - Currently taking 10 mg oxycodone , managed with assistance from his wife  Alcoholic Cirrhosis and related symptoms Chronic problem. Improved since abstained from alcohol in past year+ - MELD score is 14, improved from previous levels - Alcohol cessation has led to resolution of previous symptoms including acid reflux - Reports improved sleep and decreased nocturia since stopping alcohol          05/09/2024    6:07 PM 05/16/2023    5:40 PM 02/22/2023    2:40 PM  Depression screen PHQ 2/9  Decreased Interest 0 0 0  Down, Depressed, Hopeless 0 0 0  PHQ - 2 Score 0 0 0  Altered sleeping 0 0   Tired, decreased energy 0 0   Change in appetite 0 0   Feeling bad or  failure about yourself  0 0   Trouble concentrating 0 0   Moving slowly or fidgety/restless 0 0   Suicidal thoughts 0 0   PHQ-9 Score 0 0    Difficult doing work/chores Not difficult at all Not difficult at all      Data saved with a previous flowsheet row definition       09/21/2022   11:26 AM 12/16/2020    3:16 PM  GAD 7 : Generalized Anxiety Score  Nervous, Anxious, on Edge 0  0   Control/stop worrying 0  0   Worry too much - different things 0  0   Trouble relaxing 0  0   Restless 0  0   Easily annoyed or irritable 0  0   Afraid - awful might happen 0  0   Total GAD 7 Score 0 0  Anxiety Difficulty Not difficult at all Not difficult at all     Data saved with a previous flowsheet row definition    Social History[1]  Review of Systems Per HPI unless specifically indicated above     Objective:    BP 122/78 (BP Location: Left Arm, Patient Position: Sitting, Cuff Size: Normal)   Pulse 83   Ht 6' (1.829 m)   Wt 218 lb 6 oz (99.1 kg)   SpO2 97%   BMI 29.62 kg/m   Wt Readings from Last 3 Encounters:  07/25/24 218 lb 6 oz (  99.1 kg)  06/27/24 225 lb (102.1 kg)  05/09/24 213 lb (96.6 kg)    Physical Exam Vitals and nursing note reviewed.  Constitutional:      General: He is not in acute distress.    Appearance: Normal appearance. He is well-developed. He is not diaphoretic.     Comments: Well-appearing, uncomfortable, cooperative  HENT:     Head: Normocephalic and atraumatic.  Eyes:     General:        Right eye: No discharge.        Left eye: No discharge.     Conjunctiva/sclera: Conjunctivae normal.  Cardiovascular:     Rate and Rhythm: Normal rate.  Pulmonary:     Effort: Pulmonary effort is normal.  Musculoskeletal:     Right lower leg: Edema (R foot ankle significant edema deformity) present.     Comments: Knee scooter for mobility  Limited weight bear on R lower extremity. Deformity  Skin:    General: Skin is warm and dry.     Findings: No erythema  or rash.  Neurological:     Mental Status: He is alert and oriented to person, place, and time.  Psychiatric:        Mood and Affect: Mood normal.        Behavior: Behavior normal.        Thought Content: Thought content normal.     Comments: Well groomed, good eye contact, normal speech and thoughts     Results for orders placed or performed in visit on 06/27/24  TSH   Collection Time: 06/27/24  9:57 AM  Result Value Ref Range   TSH 3.65 0.40 - 4.50 mIU/L  Hemoglobin A1c   Collection Time: 06/27/24  9:57 AM  Result Value Ref Range   Hgb A1c MFr Bld 5.5 <5.7 %   Mean Plasma Glucose 111 mg/dL   eAG (mmol/L) 6.2 mmol/L  CBC with Differential/Platelet   Collection Time: 06/27/24  9:57 AM  Result Value Ref Range   WBC 7.6 3.8 - 10.8 Thousand/uL   RBC 4.44 4.20 - 5.80 Million/uL   Hemoglobin 12.5 (L) 13.2 - 17.1 g/dL   HCT 61.8 (L) 60.5 - 48.8 %   MCV 85.8 81.4 - 101.7 fL   MCH 28.2 27.0 - 33.0 pg   MCHC 32.8 31.6 - 35.4 g/dL   RDW 85.8 88.9 - 84.9 %   Platelets 225 140 - 400 Thousand/uL   MPV 10.9 7.5 - 12.5 fL   Neutro Abs 3,359 1,500 - 7,800 cells/uL   Absolute Lymphocytes 2,447 850 - 3,900 cells/uL   Absolute Monocytes 1,239 (H) 200 - 950 cells/uL   Eosinophils Absolute 433 15 - 500 cells/uL   Basophils Absolute 122 0 - 200 cells/uL   Neutrophils Relative % 44.2 %   Total Lymphocyte 32.2 %   Monocytes Relative 16.3 %   Eosinophils Relative 5.7 %   Basophils Relative 1.6 %  Comprehensive metabolic panel with GFR   Collection Time: 06/27/24  9:57 AM  Result Value Ref Range   Glucose, Bld 96 65 - 99 mg/dL   BUN 23 7 - 25 mg/dL   Creat 8.88 9.29 - 8.64 mg/dL   eGFR 76 > OR = 60 fO/fpw/8.26f7   BUN/Creatinine Ratio SEE NOTE: 6 - 22 (calc)   Sodium 134 (L) 135 - 146 mmol/L   Potassium 4.5 3.5 - 5.3 mmol/L   Chloride 102 98 - 110 mmol/L   CO2 23 20 - 32 mmol/L  Calcium  9.1 8.6 - 10.3 mg/dL   Total Protein 6.5 6.1 - 8.1 g/dL   Albumin  3.7 3.6 - 5.1 g/dL   Globulin  2.8 1.9 - 3.7 g/dL (calc)   AG Ratio 1.3 1.0 - 2.5 (calc)   Total Bilirubin 1.8 (H) 0.2 - 1.2 mg/dL   Alkaline phosphatase (APISO) 123 35 - 144 U/L   AST 24 10 - 35 U/L   ALT 16 9 - 46 U/L  VITAMIN D  25 Hydroxy (Vit-D Deficiency, Fractures)   Collection Time: 06/27/24  9:57 AM  Result Value Ref Range   Vit D, 25-Hydroxy 33 30 - 100 ng/mL  Vitamin B12   Collection Time: 06/27/24  9:57 AM  Result Value Ref Range   Vitamin B-12 579 200 - 1,100 pg/mL  Iron, TIBC and Ferritin Panel   Collection Time: 06/27/24  9:57 AM  Result Value Ref Range   Iron 60 50 - 180 mcg/dL   TIBC 675 749 - 574 mcg/dL (calc)   %SAT 19 (L) 20 - 48 % (calc)   Ferritin 38 24 - 380 ng/mL  Ammonia   Collection Time: 06/27/24  9:57 AM  Result Value Ref Range   Ammonia 89 (H) < OR = 72 umol/L  Protime-INR   Collection Time: 06/27/24  9:57 AM  Result Value Ref Range   INR 1.3 (H)    Prothrombin Time 13.2 (H) 9.0 - 11.5 sec      Assessment & Plan:   Problem List Items Addressed This Visit     Alcoholic cirrhosis of liver with ascites (HCC)   Chronic back pain   Relevant Medications   Oxycodone  HCl 10 MG TABS   DDD (degenerative disc disease), lumbar   Other Visit Diagnoses       Chronic foot pain, right    -  Primary   Relevant Medications   Oxycodone  HCl 10 MG TABS     Need for Streptococcus pneumoniae vaccination       Relevant Orders   Pneumococcal conjugate vaccine 20-valent (Completed)     Foot deformity, acquired, right            Chronic right foot pain and acquired foot deformity Chronic problem complicated with ankle fracture repair, ortho hardware and failure ultimately lead to revision procedures and dysfunction of foot/ankle Followed by [facility] - last visit recently Orthopedic consultation suggested against amputation due to concerns about healing due to cirrhosis and potential persistent pain post-amputation. Decision for non-surgical management with custom brace that is pending. -  Proceed with custom brace for right foot. See pain management  Chronic Pain / Pain Management Primary source if R Foot/ankle, and Chronic Low Back Pain, advanced osteoarthritis DDD Currently under opioid pain contract here with PCP managing his Oxycodone  now with reduced dose, and additional therapy with lidoderm  patches, muscle relaxant. Given chronicity, severity and complexity of his pain - we have recommended further referral to Pain Management specialist that can help manage his pain better in future - Continue oxycodone  10 mg as ordered at 10 day supply currently - New updated referral info sent to Dr Darletta Bathe in [place], including [facility] records. Patient cannot have MRI due to hardware history, he would need CT imaging if required for pain management - Ensured medical records and imaging are available for referral.  Alcoholic cirrhosis of liver with ascites Chronic condition with low albumin  levels indicating impaired liver function.  Nutritional status is a concern due to potential poor wound healing. No recent  liver consultation in over six months. Previous endoscopy appointment not completed. Last visit 06/27/24 had lab evaluation and we have calculated his MELD score at 14, with a goal of 10. He has shown significant improvement since abstaining from alcohol. - Awaiting further input from [facility]. Previously Provided contact information for [facility] liver doctor. If indicated for surgery in future, their goal was MELD 10 or less, he may have difficulty obtaining this score, but ultimately he has shown improvement and we can re-evaluate if needed  General health maintenance Pneumonia vaccine recommended by liver specialist. Shingles vaccine up to date. COVID vaccine pending. - Administered Prevnar 20 vaccine. - Plan for COVID vaccine in summer or fall.        Orders Placed This Encounter  Procedures   Pneumococcal conjugate vaccine 20-valent    Meds ordered this  encounter  Medications   Oxycodone  HCl 10 MG TABS    Sig: Take 1 tablet (10 mg total) by mouth every 4 (four) hours as needed (pain).    Dispense:  60 tablet    Refill:  0    First fill 07/25/2024    Follow up plan: Return in about 4 weeks (around 08/22/2024) for 4 weeks follow-up pain / updates.   [name], DO [facility] 07/25/2024, 11:02 AM     [1]  Social History Tobacco Use   Smoking status: Former    Current packs/day: 0.00    Average packs/day: 1 pack/day for 39.0 years (39.0 ttl pk-yrs)    Types: Cigarettes    Start date: 12/1976    Quit date: 12/2015    Years since quitting: 8.6   Smokeless tobacco: Never  Vaping Use   Vaping status: Never Used  Substance Use Topics   Alcohol use: No   Drug use: Yes    Types: Marijuana   "

## 2024-07-25 NOTE — Patient Instructions (Addendum)
 Thank you for coming to the office today.  Prevnar-20, pneumonia vaccine today, good for 5+ years  Refill Oxycodone  today for 10 day supply, we can refill as needed until you setup with Dr Fernand for pain management.  Please schedule a Follow-up Appointment to: Return in about 4 weeks (around 08/22/2024) for 4 weeks follow-up pain / updates.  If you have any other questions or concerns, please feel free to call the office or send a message through MyChart. You may also schedule an earlier appointment if necessary.  Additionally, you may be receiving a survey about your experience at our office within a few days to 1 week by e-mail or mail. We value your feedback.  [name], DO [facility], [place]

## 2024-08-26 ENCOUNTER — Ambulatory Visit: Admitting: Family Medicine
